# Patient Record
Sex: Male | Born: 1960
Health system: Southern US, Community
[De-identification: ages and names within clinical notes are randomized; demographics above are authoritative.]

## PROBLEM LIST (undated history)

## (undated) ENCOUNTER — Inpatient Hospital Stay: Admission: EM | Payer: Self-pay | Source: Home / Self Care

## (undated) DIAGNOSIS — I119 Hypertensive heart disease without heart failure: Secondary | ICD-10-CM

## (undated) DIAGNOSIS — E669 Obesity, unspecified: Secondary | ICD-10-CM

## (undated) DIAGNOSIS — I639 Cerebral infarction, unspecified: Secondary | ICD-10-CM

## (undated) DIAGNOSIS — I219 Acute myocardial infarction, unspecified: Secondary | ICD-10-CM

## (undated) DIAGNOSIS — C801 Malignant (primary) neoplasm, unspecified: Secondary | ICD-10-CM

## (undated) DIAGNOSIS — R9389 Abnormal findings on diagnostic imaging of other specified body structures: Secondary | ICD-10-CM

## (undated) DIAGNOSIS — I252 Old myocardial infarction: Secondary | ICD-10-CM

## (undated) DIAGNOSIS — I10 Essential (primary) hypertension: Secondary | ICD-10-CM

## (undated) DIAGNOSIS — R269 Unspecified abnormalities of gait and mobility: Secondary | ICD-10-CM

## (undated) DIAGNOSIS — G4730 Sleep apnea, unspecified: Secondary | ICD-10-CM

## (undated) DIAGNOSIS — E119 Type 2 diabetes mellitus without complications: Secondary | ICD-10-CM

## (undated) DIAGNOSIS — I4891 Unspecified atrial fibrillation: Secondary | ICD-10-CM

## (undated) DIAGNOSIS — Z87891 Personal history of nicotine dependence: Secondary | ICD-10-CM

## (undated) DIAGNOSIS — R9431 Abnormal electrocardiogram [ECG] [EKG]: Secondary | ICD-10-CM

## (undated) DIAGNOSIS — R413 Other amnesia: Secondary | ICD-10-CM

## (undated) DIAGNOSIS — R270 Ataxia, unspecified: Secondary | ICD-10-CM

## (undated) DIAGNOSIS — Z9289 Personal history of other medical treatment: Secondary | ICD-10-CM

## (undated) DIAGNOSIS — R7303 Prediabetes: Secondary | ICD-10-CM

## (undated) DIAGNOSIS — I619 Nontraumatic intracerebral hemorrhage, unspecified: Secondary | ICD-10-CM

## (undated) HISTORY — DX: Abnormal findings on diagnostic imaging of other specified body structures: R93.89

## (undated) HISTORY — DX: Old myocardial infarction: I25.2

## (undated) HISTORY — DX: Other amnesia: R41.3

## (undated) HISTORY — PX: CIRCUMCISION: SUR203

## (undated) HISTORY — DX: Obesity, unspecified: E66.9

## (undated) HISTORY — DX: Sleep apnea, unspecified: G47.30

## (undated) HISTORY — DX: Nontraumatic intracerebral hemorrhage, unspecified: I61.9

## (undated) HISTORY — PX: APPENDECTOMY: SHX54

## (undated) HISTORY — DX: Type 2 diabetes mellitus without complications: E11.9

## (undated) HISTORY — DX: Unspecified abnormalities of gait and mobility: R26.9

## (undated) HISTORY — DX: Hypertensive heart disease without heart failure: I11.9

## (undated) HISTORY — PX: CHOLECYSTECTOMY: SHX55

## (undated) HISTORY — DX: Ataxia, unspecified: R27.0

## (undated) HISTORY — DX: Personal history of nicotine dependence: Z87.891

## (undated) HISTORY — DX: Cerebral infarction, unspecified: I63.9

## (undated) HISTORY — PX: HEMORRHOID SURGERY: SHX153

## (undated) HISTORY — DX: Prediabetes: R73.03

## (undated) HISTORY — DX: Personal history of other medical treatment: Z92.89

## (undated) HISTORY — DX: Abnormal electrocardiogram (ECG) (EKG): R94.31

## (undated) HISTORY — DX: Unspecified atrial fibrillation: I48.91

## (undated) MED FILL — Fosaprepitant Dimeglumine For IV Infusion 150 MG (Base Eq): INTRAVENOUS | Qty: 5 | Status: AC

---

## 2014-04-30 DIAGNOSIS — I119 Hypertensive heart disease without heart failure: Secondary | ICD-10-CM

## 2014-04-30 DIAGNOSIS — R270 Ataxia, unspecified: Secondary | ICD-10-CM

## 2014-04-30 DIAGNOSIS — R269 Unspecified abnormalities of gait and mobility: Secondary | ICD-10-CM

## 2014-04-30 DIAGNOSIS — R9431 Abnormal electrocardiogram [ECG] [EKG]: Secondary | ICD-10-CM

## 2014-04-30 DIAGNOSIS — R413 Other amnesia: Secondary | ICD-10-CM

## 2014-04-30 DIAGNOSIS — I639 Cerebral infarction, unspecified: Secondary | ICD-10-CM

## 2014-04-30 DIAGNOSIS — Z9289 Personal history of other medical treatment: Secondary | ICD-10-CM

## 2014-04-30 DIAGNOSIS — R9389 Abnormal findings on diagnostic imaging of other specified body structures: Secondary | ICD-10-CM

## 2014-04-30 HISTORY — DX: Unspecified abnormalities of gait and mobility: R26.9

## 2014-04-30 HISTORY — DX: Ataxia, unspecified: R27.0

## 2014-04-30 HISTORY — DX: Other amnesia: R41.3

## 2014-04-30 HISTORY — DX: Abnormal findings on diagnostic imaging of other specified body structures: R93.89

## 2014-04-30 HISTORY — DX: Cerebral infarction, unspecified: I63.9

## 2014-04-30 HISTORY — DX: Hypertensive heart disease without heart failure: I11.9

## 2014-04-30 HISTORY — DX: Personal history of other medical treatment: Z92.89

## 2014-04-30 HISTORY — DX: Abnormal electrocardiogram (ECG) (EKG): R94.31

## 2014-05-05 DIAGNOSIS — I619 Nontraumatic intracerebral hemorrhage, unspecified: Secondary | ICD-10-CM

## 2014-05-05 HISTORY — DX: Nontraumatic intracerebral hemorrhage, unspecified: I61.9

## 2014-05-08 DIAGNOSIS — I214 Non-ST elevation (NSTEMI) myocardial infarction: Secondary | ICD-10-CM | POA: Insufficient documentation

## 2014-05-20 ENCOUNTER — Telehealth: Payer: Self-pay | Admitting: Medical

## 2014-05-20 ENCOUNTER — Encounter: Payer: Self-pay | Admitting: Medical

## 2014-05-20 ENCOUNTER — Ambulatory Visit (INDEPENDENT_AMBULATORY_CARE_PROVIDER_SITE_OTHER): Payer: BLUE CROSS/BLUE SHIELD | Admitting: Medical

## 2014-05-20 VITALS — BP 130/80 | HR 76 | Temp 97.9°F | Resp 15 | Ht >= 80 in | Wt 318.0 lb

## 2014-05-20 DIAGNOSIS — R413 Other amnesia: Secondary | ICD-10-CM | POA: Diagnosis not present

## 2014-05-20 DIAGNOSIS — I119 Hypertensive heart disease without heart failure: Secondary | ICD-10-CM | POA: Diagnosis not present

## 2014-05-20 DIAGNOSIS — R7989 Other specified abnormal findings of blood chemistry: Secondary | ICD-10-CM

## 2014-05-20 DIAGNOSIS — R270 Ataxia, unspecified: Secondary | ICD-10-CM | POA: Insufficient documentation

## 2014-05-20 DIAGNOSIS — I619 Nontraumatic intracerebral hemorrhage, unspecified: Secondary | ICD-10-CM | POA: Diagnosis not present

## 2014-05-20 DIAGNOSIS — I10 Essential (primary) hypertension: Secondary | ICD-10-CM | POA: Insufficient documentation

## 2014-05-20 DIAGNOSIS — Z87891 Personal history of nicotine dependence: Secondary | ICD-10-CM

## 2014-05-20 DIAGNOSIS — R52 Pain, unspecified: Secondary | ICD-10-CM | POA: Diagnosis not present

## 2014-05-20 DIAGNOSIS — R42 Dizziness and giddiness: Secondary | ICD-10-CM | POA: Insufficient documentation

## 2014-05-20 DIAGNOSIS — R778 Other specified abnormalities of plasma proteins: Secondary | ICD-10-CM

## 2014-05-20 LAB — CBC
HCT: 39.1 % (ref 39.0–52.0)
Hemoglobin: 13.3 g/dL (ref 13.0–17.0)
MCH: 29.4 pg (ref 26.0–34.0)
MCHC: 34 g/dL (ref 30.0–36.0)
MCV: 86.5 fL (ref 78.0–100.0)
MPV: 9.9 fL (ref 8.6–12.4)
PLATELETS: 340 10*3/uL (ref 150–400)
RBC: 4.52 MIL/uL (ref 4.22–5.81)
RDW: 14.1 % (ref 11.5–15.5)
WBC: 9.7 10*3/uL (ref 4.0–10.5)

## 2014-05-20 MED ORDER — ATORVASTATIN CALCIUM 20 MG PO TABS: 20.0000 mg | ORAL_TABLET | Freq: Every day | ORAL | Status: DC

## 2014-05-20 MED ORDER — LABETALOL HCL 200 MG PO TABS: 200.0000 mg | ORAL_TABLET | Freq: Two times a day (BID) | ORAL | Status: DC

## 2014-05-20 MED ORDER — HYDROCODONE-ACETAMINOPHEN 5-325 MG PO TABS: 1.0000 | ORAL_TABLET | Freq: Three times a day (TID) | ORAL | Status: DC | PRN

## 2014-05-20 MED ORDER — AMLODIPINE BESYLATE 10 MG PO TABS: 10.0000 mg | ORAL_TABLET | Freq: Every day | ORAL | Status: DC

## 2014-05-20 MED ORDER — NICOTINE 14 MG/24HR TD PT24: 14.0000 mg | MEDICATED_PATCH | Freq: Every day | TRANSDERMAL | Status: DC

## 2014-05-20 MED ORDER — HYDRALAZINE HCL 100 MG PO TABS: 100.0000 mg | ORAL_TABLET | Freq: Three times a day (TID) | ORAL | Status: DC

## 2014-05-20 MED ORDER — LOSARTAN POTASSIUM 100 MG PO TABS: 100.0000 mg | ORAL_TABLET | Freq: Every day | ORAL | Status: DC

## 2014-05-20 MED ORDER — SPIRONOLACTONE 50 MG PO TABS
50.0000 mg | ORAL_TABLET | Freq: Every day | ORAL | Status: DC
Start: 2014-05-20 — End: 2014-05-28

## 2014-05-20 NOTE — Telephone Encounter (Signed)
pls contact home health to begin physical therapy and occupational therapy for this patient.   He would like in home therapy for PT and OT as he currently can't drive.   diagnoses include recent stroke, ataxia, short term memory loss, gait changes.

## 2014-05-20 NOTE — Telephone Encounter (Signed)
Please refer to neurology here locally for stroke f/u.

## 2014-05-20 NOTE — Telephone Encounter (Signed)
Called out cane to his pharmacy per Vincenza HewsShane.

## 2014-05-20 NOTE — Progress Notes (Signed)
Subjective Here as a new patient today, accompanied by his wife.   Was living in [place], [place], but they just moved to [place] recently.  He still works for [employer] in [employer] which he has done long term.  He is here for hospital f/u.    Was at work on 05/05/14, felt disoriented, dizzy, headache, EMS was called and taken to [facility] ED.  Was then transferred to [facility] for stroke, MI, uncontrolled hypertension, memory loss.   Was started on several medications, and this is his first hospital f/u.    He has cardiology f/u appt already scheduled here in [facility] with Dr. Clarise Cruz.  He has neurology consult f/u scheduled for [facility] but would rather that be here in [place] since [place] is far away.   He is taking all his new medications.  Thought he was discharged with pain medication, but has none and reports pain.    He reported was in usual state of health prior to the stroke, working full time, and had no routine complaints until the stroke . He had avoided going to the doctors for years.    He currently reports being off balance with walking, thinks a cane may help for now. Worried about falling.  He still notes short term memory loss since the stroke.  Ongoing headache, dizziness, sometimes slower to process thoughts, and has generalized pains in back, knees, legs, arms, wonders if this is related to cholesterol medication or new medications as he did not have these issues prior.  He is a long time smoker.   He also now notes not getting erections since all this has occurred.   He hasn't started PT or OT, but has order for this, and frustrated this hasn't started.   No other aggravating or relieving factors. No other complaint.  Past Medical History  Diagnosis Date  . Intraparenchymal hemorrhage of brain 05/05/14    hospitalization [facility]  . Hypertensive heart disease 04/2014  . Stroke 04/2014  . EKG abnormalities 04/2014    poor R wave  progression, NSR  . H/O echocardiogram 04/2014    moderate LVH, 60-65% EF, no valve disease  . Abnormal ultrasound of carotid artery 04/2014    no significant obstruction  . Ataxia 04/2014  . Short-term memory loss 04/2014  . Gait disturbance 04/2014    s/p stroke  . Former smoker     25 years x 1.5 ppd, stopped 04/2014    Past Surgical History  Procedure Laterality Date  . Hemorrhoid surgery    . Appendectomy      History   Social History  . Marital Status: Married    Spouse Name: N/A  . Number of Children: N/A  . Years of Education: N/A   Occupational History  . Not on file.   Social History Main Topics  . Smoking status: Former Smoker -- 1.50 packs/day for 25 years    Quit date: 05/05/2014  . Smokeless tobacco: Not on file  . Alcohol Use: No  . Drug Use: No  . Sexual Activity: Not on file   Other Topics Concern  . Not on file   Social History Narrative   Married to [name], non denominational, works at [employer] in [place], [place].  From [place], but moved to [place] 2016.      Family History  Problem Relation Age of Onset  . Heart disease Mother   . Hypertension Mother   . Hypertension Brother   .  Aneurysm Paternal Grandmother      Current outpatient prescriptions:  .  amLODipine (NORVASC) 10 MG tablet, Take by mouth., Disp: , Rfl:  .  atorvastatin (LIPITOR) 20 MG tablet, Take by mouth., Disp: , Rfl:  .  hydrALAZINE (APRESOLINE) 100 MG tablet, Take by mouth., Disp: , Rfl:  .  labetalol (NORMODYNE) 200 MG tablet, Take by mouth., Disp: , Rfl:  .  losartan (COZAAR) 100 MG tablet, Take by mouth., Disp: , Rfl:  .  nicotine (NICODERM CQ - DOSED IN MG/24 HOURS) 14 mg/24hr patch, Place 14 mg onto the skin daily., Disp: , Rfl:  .  nicotine (NICODERM CQ - DOSED IN MG/24 HOURS) 14 mg/24hr patch, Place onto the skin., Disp: , Rfl:  .  spironolactone (ALDACTONE) 50 MG tablet, Take by mouth., Disp: , Rfl:  .  HYDROcodone-acetaminophen (NORCO/VICODIN) 5-325 MG  per tablet, Take 1 tablet by mouth every 8 (eight) hours as needed for moderate pain., Disp: 45 tablet, Rfl: 0  No Known Allergies     Objective: BP 130/80 mmHg  Pulse 76  Temp(Src) 97.9 F (36.6 C) (Oral)  Resp 15  Ht 6' 8.5" (2.045 m)  Wt 318 lb (144.244 kg)  BMI 34.49 kg/m2  General appearance: alert, no distress, WD/WN, tall AA male, obese HEENT: normocephalic, sclerae anicteric, PERRLA, EOMi, nares patent, no discharge or erythema, pharynx normal Oral cavity: MMM, no lesions Neck: supple, no lymphadenopathy, no thyromegaly, no masses, no bruits Heart: RRR, normal S1, S2, no murmurs Lungs: CTA bilaterally, no wheezes, rhonchi, or rales Back: non tender Musculoskeletal: nontender, no swelling, no obvious deformity Extremities: no edema, no cyanosis, no clubbing Pulses: 2+ symmetric, upper and lower extremities, normal cap refill Neurological: alert, oriented x 2, seems to have slightly guarded gait, mild ataxia, seems to have a little trouble following commands such as finger to nose or directions with examination questions.  Seems to have normal strength and sensation throughout, normal DTRs.    Psychiatric: normal affect, behavior normal, pleasant   From 05/05/14 hospital notes:  CT brain Imaging showed parenchymal hematoma into the posterior aspet of the left cingulate gyrus and left occipital lobe, in the precuneus, extension into the occipital horn and body of left lateral ventricle and slightly into right lateral ventricle.  Minimal amount of blood in the 3rd, no hydrocephalus.  Old lacunar infarct in anterior limb of the right internal capsule.  No bleeding on CT angiogram.  There is only a single vertebral artery.    Renal function, coags, platelets, fibrinogen level all normal.   No other lab abnormalities noted other than elevated troponin's.    EKG was noted to have NSR, poor anterior R wave progression, no ischemic changes Echo showed EF 60-65%, moderate LVH, no  valve issues, normal bubble study.   Carotid duplex shows no carotid obstructive disease.   Renal duplex shows no hemodynamically significant renal artery stenosis with patent renal veins   Assessment: Encounter Diagnoses  Name Primary?  . Intraparenchymal hemorrhage of brain Yes  . Essential hypertension   . Hypertensive heart disease without heart failure   . Elevated troponin   . Ataxia   . Amnesia memory loss   . Dizziness and giddiness   . Diffuse pain   . Former smoker     Plan: Reviewed the stack of paperwork they brought.  will request additional records and discharge summary from hospital.   Will move forward with orders for physical and occupational therapy, preferably for home visits since wife  works full time and he is not fit to drive at the moment.    Ataxia - Order for cane for now, referral for PT/OT and neurology referral  Stroke/intraparenchymal hemorrhage of brain - will need brain imaging in the next week or 2 as a f/u.  referral to neurology for consult as well.  Hypertensive heart disease, elevated troponin - c/t all medications, refilled all medications.   He has f/u already with cardiology.   He will need outpatient stress test recommended 4-6 weeks following additional recovery from stroke.   Amnesia - neurology referral and hopefully this will resolve with time.  Discussed ways to manage at home with clocks, newspaper daily reading, talking about current things with wife, friends, and working with wife to reinforced the here and now  Diffuse pain - likely related to the recent hypoperfusion issues.  Hydrocodone prn short term.  Former smoker - discussed the importance or remaining abstinent, c/t nicotine patch  F/u pending referrals, neuro consult, PT/OT referral, CT head, cardiology f/u, and here in 2wk  Spent >45 min both face to face and with referrals, review of records, discussion of his recent hospitalization and clarification of his diagnoses.

## 2014-05-20 NOTE — Telephone Encounter (Signed)
[facility] at [phone] per [place] RanchShane.

## 2014-05-20 NOTE — Telephone Encounter (Signed)
Faxed referral form and all information to diane at [facility] [phone]

## 2014-05-20 NOTE — Telephone Encounter (Signed)
pls call out a cane for ataxia, recent stroke, dizziness.

## 2014-05-20 NOTE — Telephone Encounter (Signed)
Please set up CT head/brain without contrast for late next week.   Please cancel the May 13th CT in [place] as well as the neurology f/[place] appt in [place] once we have the local neurology appt.

## 2014-05-21 ENCOUNTER — Telehealth: Payer: Self-pay | Admitting: Medical

## 2014-05-21 LAB — BASIC METABOLIC PANEL
BUN: 17 mg/dL (ref 6–23)
CHLORIDE: 105 meq/L (ref 96–112)
CO2: 21 meq/L (ref 19–32)
CREATININE: 1.07 mg/dL (ref 0.50–1.35)
Calcium: 9.3 mg/dL (ref 8.4–10.5)
Glucose, Bld: 120 mg/dL — ABNORMAL HIGH (ref 70–99)
POTASSIUM: 4.8 meq/L (ref 3.5–5.3)
Sodium: 138 mEq/L (ref 135–145)

## 2014-05-21 NOTE — Telephone Encounter (Signed)
Misty [email or web address] state that they received the referral from us but per insurance they are out of network and Misty StanleyLisa will let you know who they can refer pt to

## 2014-05-25 ENCOUNTER — Other Ambulatory Visit: Payer: Self-pay | Admitting: Family Medicine

## 2014-05-25 DIAGNOSIS — I618 Other nontraumatic intracerebral hemorrhage: Secondary | ICD-10-CM

## 2014-05-25 NOTE — Telephone Encounter (Signed)
Patient's wife is [facility] of the [facility]

## 2014-05-25 NOTE — Telephone Encounter (Signed)
I re-fax the patient's information and referral over to [facility] for OT and PT fax # [phone]

## 2014-05-25 NOTE — Telephone Encounter (Signed)
[facility] 05/28/14 @ 1130 AM CT [facility]  [address] [facility], [place]

## 2014-05-26 ENCOUNTER — Other Ambulatory Visit: Payer: Self-pay

## 2014-05-26 NOTE — Telephone Encounter (Signed)
Patient is being seen by [facility] for OT and PT. The therapists said that he is very motivated  and willing as well as the wife. She called for a verbal to continue therapy with the patient. She would like to do this 3 times a week for 4 weeks.  therapists # [phone]

## 2014-05-26 NOTE — Telephone Encounter (Signed)
That is fine 

## 2014-05-28 ENCOUNTER — Ambulatory Visit (INDEPENDENT_AMBULATORY_CARE_PROVIDER_SITE_OTHER): Payer: BLUE CROSS/BLUE SHIELD | Admitting: Cardiovascular Disease

## 2014-05-28 ENCOUNTER — Inpatient Hospital Stay: Admission: RE | Admit: 2014-05-28 | Payer: Self-pay | Source: Ambulatory Visit

## 2014-05-28 ENCOUNTER — Encounter: Payer: Self-pay | Admitting: Cardiovascular Disease

## 2014-05-28 VITALS — BP 136/94 | HR 73 | Ht >= 80 in | Wt 313.0 lb

## 2014-05-28 DIAGNOSIS — I119 Hypertensive heart disease without heart failure: Secondary | ICD-10-CM

## 2014-05-28 DIAGNOSIS — R42 Dizziness and giddiness: Secondary | ICD-10-CM | POA: Diagnosis not present

## 2014-05-28 DIAGNOSIS — I214 Non-ST elevation (NSTEMI) myocardial infarction: Secondary | ICD-10-CM

## 2014-05-28 DIAGNOSIS — I10 Essential (primary) hypertension: Secondary | ICD-10-CM

## 2014-05-28 MED ORDER — SPIRONOLACTONE 50 MG PO TABS: 50.0000 mg | ORAL_TABLET | Freq: Every day | ORAL | Status: DC

## 2014-05-28 MED ORDER — HYDRALAZINE HCL 100 MG PO TABS: 100.0000 mg | ORAL_TABLET | Freq: Three times a day (TID) | ORAL | Status: DC

## 2014-05-28 MED ORDER — LABETALOL HCL 200 MG PO TABS: 200.0000 mg | ORAL_TABLET | Freq: Two times a day (BID) | ORAL | Status: DC

## 2014-05-28 MED ORDER — ATORVASTATIN CALCIUM 20 MG PO TABS: 20.0000 mg | ORAL_TABLET | Freq: Every day | ORAL | Status: DC

## 2014-05-28 MED ORDER — LOSARTAN POTASSIUM 100 MG PO TABS: 100.0000 mg | ORAL_TABLET | Freq: Every day | ORAL | Status: DC

## 2014-05-28 MED ORDER — AMLODIPINE BESYLATE 10 MG PO TABS: 10.0000 mg | ORAL_TABLET | Freq: Every day | ORAL | Status: DC

## 2014-05-28 NOTE — Patient Instructions (Signed)
Medication Instructions:  Your physician recommends that you continue on your current medications as directed. Please refer to the Current Medication list given to you today.   Labwork: None  Testing/Procedures: Your physician has requested that you have a lexiscan myoview. For further information please visit [email or web address]. Please follow instruction sheet, as given.   Follow-Up: Your physician wants you to follow-up in: 6 months with Dr. Elease HashimotoNahser.  You will receive a reminder letter in the mail two months in advance. If you don't receive a letter, please call our office to schedule the follow-up appointment.

## 2014-05-28 NOTE — Progress Notes (Signed)
Cardiology Office Note   Date:  05/28/2014   ID:  Brad EndoArchie [name], DOB 1960/05/15, MRN [id number]  PCP:  Ernst BreachYSINGER, Brad SHANE, PA-C  Cardiologist:   Vesta MixerNahser,  J, MD   Chief Complaint  Patient presents with  . Hypertension   Problem list: 1. 1. Malignant hypertension - tachycardia gram shows normal left trigger systolic function with EF of 60-65% with moderate LVH 2. Intracranial hemorrhage caused by malignant hypertension 3. Non-ST segment elevation myocardial infarction-in the setting of malignant hypertension,  Echo shows normal LF function with EF of 60-65% with no segmental wall abn.    History of Present Illness: Brad [name] is a 54 y.o. male who presents for follow up for malignant hypertension and a type 2 non-ST segment myocardial infarction.  He's been doing fairly well from a cardiac point. He's not had any episodes of chest discomfort. He's currently in rehabilitation for intracranial hemorrhage. His blood pressure is much better controlled.  He has lost quite a bit of weight.      Past Medical History  Diagnosis Date  . Intraparenchymal hemorrhage of brain 05/05/14    [facility]  . Hypertensive heart disease 04/2014  . Stroke 04/2014  . EKG abnormalities 04/2014    poor R wave progression, NSR  . H/O echocardiogram 04/2014    moderate LVH, 60-65% EF, no valve disease  . Abnormal ultrasound of carotid artery 04/2014    no significant obstruction  . Ataxia 04/2014  . Short-term memory loss 04/2014  . Gait disturbance 04/2014    s/p stroke  . Former smoker     25 years x 1.5 ppd, stopped 04/2014    Past Surgical History  Procedure Laterality Date  . Hemorrhoid surgery    . Appendectomy       Current Outpatient Prescriptions  Medication Sig Dispense Refill  . amLODipine (NORVASC) 10 MG tablet Take 1 tablet (10 mg total) by mouth daily. 90 tablet 1  . atorvastatin (LIPITOR) 20 MG tablet Take 1 tablet (20 mg total) by mouth daily  at 6 PM. 90 tablet 1  . hydrALAZINE (APRESOLINE) 100 MG tablet Take 1 tablet (100 mg total) by mouth 3 (three) times daily. 270 tablet 0  . labetalol (NORMODYNE) 200 MG tablet Take 1 tablet (200 mg total) by mouth 2 (two) times daily. 180 tablet 1  . losartan (COZAAR) 100 MG tablet Take 1 tablet (100 mg total) by mouth daily. 90 tablet 1  . nicotine (NICODERM CQ - DOSED IN MG/24 HOURS) 14 mg/24hr patch Place 14 mg onto the skin daily.    . nicotine (NICODERM CQ - DOSED IN MG/24 HOURS) 14 mg/24hr patch Place 1 patch (14 mg total) onto the skin daily. 28 patch 1  . spironolactone (ALDACTONE) 50 MG tablet Take 1 tablet (50 mg total) by mouth daily. 90 tablet 1   No current facility-administered medications for this visit.    Allergies:   Review of patient's allergies indicates no known allergies.    Social History:  The patient  reports that he quit smoking about 3 weeks ago. He does not have any smokeless tobacco history on file. He reports that he does not drink alcohol or use illicit drugs.   Family History:  The patient's family history includes Aneurysm in his paternal grandmother; Heart disease in his mother; Hypertension in his brother and mother.    ROS:  Please see the history of present illness.    Review of Systems: Constitutional:  denies  fever, chills, diaphoresis,   and fatigue. Appetite has not been normal   HEENT: denies photophobia, eye pain, redness, hearing loss, ear pain, congestion, sore throat, rhinorrhea, sneezing, neck pain, neck stiffness and tinnitus.  Respiratory: denies SOB, DOE, cough, chest tightness, and wheezing.  Cardiovascular: denies chest pain, palpitations and leg swelling.  Gastrointestinal: denies nausea, vomiting, abdominal pain, diarrhea, constipation, blood in stool.  Genitourinary: denies dysuria, urgency, frequency, hematuria, flank pain and difficulty urinating.  Musculoskeletal: denies  myalgias, back pain, joint swelling, arthralgias and gait  problem.   Skin: denies pallor, rash and wound.  Neurological: denies dizziness, seizures, syncope, weakness, light-headedness, numbness and headaches.   Hematological: denies adenopathy, easy bruising, personal or family bleeding history.  Psychiatric/ Behavioral: denies suicidal ideation, mood changes, confusion, nervousness, sleep disturbance and agitation.       All other systems are reviewed and negative.    PHYSICAL EXAM: VS:  BP 136/94 mmHg  Pulse 73  Ht 6' 8.5" (2.045 m)  Wt 313 lb (141.976 kg)  BMI 33.95 kg/m2 , BMI Body mass index is 33.95 kg/(m^2). GEN: Well nourished, well developed, in no acute distress HEENT: normal Neck: no JVD, carotid bruits, or masses Cardiac: RRR; no murmurs, rubs, or gallops,no edema  Respiratory:  clear to auscultation bilaterally, normal work of breathing GI: soft, nontender, nondistended, + BS MS: no deformity or atrophy Skin: warm and dry, no rash Neuro:  Strength and sensation are intact Psych: normal   EKG:  EKG is not ordered today. The ekg ordered today demonstrates    Recent Labs: 05/20/2014: BUN 17; Creatinine 1.07; Hemoglobin 13.3; Platelets 340; Potassium 4.8; Sodium 138    Lipid Panel No results found for: CHOL, TRIG, HDL, CHOLHDL, VLDL, LDLCALC, LDLDIRECT    Wt Readings from Last 3 Encounters:  05/28/14 313 lb (141.976 kg)  05/20/14 318 lb (144.244 kg)      Other studies Reviewed: Additional studies/ records that were reviewed today include: . Review of the above records demonstrates:    ASSESSMENT AND PLAN:  1.  Malignant hypertension - tachycardia gram shows normal left trigger systolic function with EF of 60-65% with moderate LVH 2. Intracranial hemorrhage caused by malignant hypertension 3. Non-ST segment elevation myocardial infarction-in the setting of malignant hypertension,  Echo shows normal LF function with EF of 60-65% with no segmental wall abn.    Current medicines are reviewed at length with  the patient today.  The patient does not have concerns regarding medicines.  The following changes have been made:  no change  Labs/ tests ordered today include:  No orders of the defined types were placed in this encounter.     Disposition:   FU with me in 6 months. Steffanie Dunn in 1 month      , Deloris Ping, MD  05/28/2014 3:53 PM    [facility] [address] [place], [place], [place]  [address] Phone: [phone]; Fax: [phone]   [facility]  [address] [place], [place]  [address] [phone]    Fax [phone]

## 2014-05-31 ENCOUNTER — Encounter: Payer: Self-pay | Admitting: Medical

## 2014-06-07 ENCOUNTER — Ambulatory Visit
Admission: RE | Admit: 2014-06-07 | Discharge: 2014-06-07 | Disposition: A | Discharge status: Home / Self Care | Payer: BLUE CROSS/BLUE SHIELD | Source: Ambulatory Visit | Attending: Medical | Admitting: Medical

## 2014-06-07 DIAGNOSIS — I618 Other nontraumatic intracerebral hemorrhage: Secondary | ICD-10-CM

## 2014-06-08 ENCOUNTER — Ambulatory Visit
Admission: RE | Admit: 2014-06-08 | Discharge: 2014-06-08 | Disposition: A | Discharge status: Home / Self Care | Payer: BLUE CROSS/BLUE SHIELD | Source: Ambulatory Visit | Attending: Medical | Admitting: Medical

## 2014-06-08 ENCOUNTER — Other Ambulatory Visit: Payer: Self-pay | Admitting: Family Medicine

## 2014-06-08 DIAGNOSIS — S06369S Traumatic hemorrhage of cerebrum, unspecified, with loss of consciousness of unspecified duration, sequela: Secondary | ICD-10-CM

## 2014-06-10 ENCOUNTER — Encounter: Payer: Self-pay | Admitting: Medical

## 2014-06-10 ENCOUNTER — Ambulatory Visit (INDEPENDENT_AMBULATORY_CARE_PROVIDER_SITE_OTHER): Payer: BLUE CROSS/BLUE SHIELD | Admitting: Medical

## 2014-06-10 VITALS — BP 158/80 | HR 78 | Temp 98.3°F | Resp 16 | Wt 320.0 lb

## 2014-06-10 DIAGNOSIS — R270 Ataxia, unspecified: Secondary | ICD-10-CM

## 2014-06-10 DIAGNOSIS — I119 Hypertensive heart disease without heart failure: Secondary | ICD-10-CM

## 2014-06-10 DIAGNOSIS — I10 Essential (primary) hypertension: Secondary | ICD-10-CM | POA: Diagnosis not present

## 2014-06-10 DIAGNOSIS — R471 Dysarthria and anarthria: Secondary | ICD-10-CM | POA: Diagnosis not present

## 2014-06-10 DIAGNOSIS — R42 Dizziness and giddiness: Secondary | ICD-10-CM

## 2014-06-10 DIAGNOSIS — I69923 Fluency disorder following unspecified cerebrovascular disease: Secondary | ICD-10-CM | POA: Diagnosis not present

## 2014-06-10 DIAGNOSIS — I619 Nontraumatic intracerebral hemorrhage, unspecified: Secondary | ICD-10-CM | POA: Diagnosis not present

## 2014-06-10 DIAGNOSIS — R413 Other amnesia: Secondary | ICD-10-CM | POA: Diagnosis not present

## 2014-06-10 NOTE — Progress Notes (Signed)
Subjective Here for f/u, accompanied by his wife.  I just saw him recently as a new patient for hospital f/u from recent hemorrhagic stroke.  He notes from last visit some decreased sensation in right arm that has been intermittent since the stroke, feels a little weaker on right side than left, still has dizziness and feeling of disequilibrium, and in the last week having some stuttering.  Since last visit he has been getting occupation and physical therapy, and this past week started speech therapy.  Has seen cardiology recently, has nuclear stress test planned in about a month.   Has f/u with neurology on Monday.   Needs short term disability form completed.     From last visit's notes:  [place] living in [place], [place], but they just moved to [place] recently.  He still works for [employer] in [place] making [place] which he has done long term.  He is here for hospital f/u.    Was at work on 05/05/14, felt disoriented, dizzy, headache, EMS was called and [facility].  Was then transferred to [facility] for stroke, MI, uncontrolled hypertension, memory loss.   Was started on several medications, and this is his first hospital f/u.    He has cardiology f/u appt already scheduled [place] with Dr. Clarise CruzBenshimon.  He has neurology consult f/u scheduled for [facility] but would rather that be here in [place] since [place] far away.   He is taking all his new medications.  Thought he was discharged with pain medication, but has none and reports pain.    He reported was in usual state of health prior to the stroke, working full time, and had no routine complaints until the stroke . He had avoided going to the doctors for years.  No other aggravating or relieving factors. No other complaint.  Past Medical History  Diagnosis Date  . Intraparenchymal hemorrhage of brain 05/05/14    hospitalization [facility]  . Hypertensive heart disease 04/2014  . Stroke 04/2014  .  EKG abnormalities 04/2014    poor R wave progression, NSR  . H/O echocardiogram 04/2014    moderate LVH, 60-65% EF, no valve disease  . Abnormal ultrasound of carotid artery 04/2014    no significant obstruction  . Ataxia 04/2014  . Short-term memory loss 04/2014  . Gait disturbance 04/2014    s/p stroke  . Former smoker     25 years x 1.5 ppd, stopped 04/2014    Past Surgical History  Procedure Laterality Date  . Hemorrhoid surgery    . Appendectomy      History   Social History  . Marital Status: Married    Spouse Name: N/A  . Number of Children: N/A  . Years of Education: N/A   Occupational History  . Not on file.   Social History Main Topics  . Smoking status: Former Smoker -- 1.50 packs/day for 25 years    Quit date: 05/05/2014  . Smokeless tobacco: Not on file  . Alcohol Use: No  . Drug Use: No  . Sexual Activity: Not on file   Other Topics Concern  . Not on file   Social History Narrative   Married to TransMontaigneCrystal Cobert, non denominational, works at [employer] in [place], [place].  From [place], but moved to [place] 2016.      Family History  Problem Relation Age of Onset  . Heart disease Mother   . Hypertension Mother   . Hypertension  Brother   . Aneurysm Paternal Grandmother      Current outpatient prescriptions:  .  amLODipine (NORVASC) 10 MG tablet, Take 1 tablet (10 mg total) by mouth daily., Disp: 90 tablet, Rfl: 3 .  atorvastatin (LIPITOR) 20 MG tablet, Take 1 tablet (20 mg total) by mouth daily at 6 PM., Disp: 90 tablet, Rfl: 3 .  hydrALAZINE (APRESOLINE) 100 MG tablet, Take 1 tablet (100 mg total) by mouth 3 (three) times daily., Disp: 270 tablet, Rfl: 3 .  labetalol (NORMODYNE) 200 MG tablet, Take 1 tablet (200 mg total) by mouth 2 (two) times daily., Disp: 180 tablet, Rfl: 3 .  losartan (COZAAR) 100 MG tablet, Take 1 tablet (100 mg total) by mouth daily., Disp: 90 tablet, Rfl: 3 .  spironolactone (ALDACTONE) 50 MG tablet, Take 1 tablet (50 mg total)  by mouth daily., Disp: 90 tablet, Rfl: 3 .  nicotine (NICODERM CQ - DOSED IN MG/24 HOURS) 14 mg/24hr patch, Place 14 mg onto the skin daily., Disp: , Rfl:  .  nicotine (NICODERM CQ - DOSED IN MG/24 HOURS) 14 mg/24hr patch, Place 1 patch (14 mg total) onto the skin daily. (Patient not taking: Reported on 06/10/2014), Disp: 28 patch, Rfl: 1  No Known Allergies     Objective: BP 158/80 mmHg  Pulse 78  Temp(Src) 98.3 F (36.8 C) (Oral)  Resp 16  Wt 320 lb (145.151 kg)  General appearance: alert, no distress, WD/WN, tall AA male, obese HEENT: normocephalic, sclerae anicteric, PERRLA, EOMi, nares patent, no discharge or erythema, pharynx normal Oral cavity: MMM, no lesions Neck: supple, no lymphadenopathy, no thyromegaly, no masses, no bruits, no bruits Heart: RRR, normal S1, S2, no murmurs Lungs: CTA bilaterally, no wheezes, rhonchi, or rales Back: non tender Musculoskeletal: nontender, no swelling, no obvious deformity Extremities: no edema, no cyanosis, no clubbing Pulses: 2+ symmetric, upper and lower extremities, normal cap refill Neurological: alert, oriented x 2, seems to have guarded gait, mild ataxia, seems to have a little trouble following commands such as finger to nose or directions with examination questions.  Seems to have normal strength and sensation throughout, normal DTRs.    Psychiatric: normal affect, behavior normal, pleasant   From 05/05/14 hospital notes:  CT brain Imaging showed parenchymal hematoma into the posterior aspet of the left cingulate gyrus and left occipital lobe, in the precuneus, extension into the occipital horn and body of left lateral ventricle and slightly into right lateral ventricle.  Minimal amount of blood in the 3rd, no hydrocephalus.  Old lacunar infarct in anterior limb of the right internal capsule.  No bleeding on CT angiogram.  There is only a single vertebral artery.    Renal function, coags, platelets, fibrinogen level all normal.   No  other lab abnormalities noted other than elevated troponin's.    EKG was noted to have NSR, poor anterior R wave progression, no ischemic changes Echo showed EF 60-65%, moderate LVH, no valve issues, normal bubble study.   Carotid duplex shows no carotid obstructive disease.   Renal duplex shows no hemodynamically significant renal artery stenosis with patent renal veins   Assessment: Encounter Diagnoses  Name Primary?  . Intraparenchymal hemorrhage of brain Yes  . Essential hypertension   . Hypertensive heart disease without heart failure   . Ataxia   . Amnesia memory loss   . Dizziness and giddiness   . Dysarthria   . 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  C/t PT, OT, speech therapy.  Script for cane today.  C/t all medications.  Reviewed cardiology notes since last visit here.  Plan to have nuclear stress test as scheduled.    Will complete his short term disability forms.   He will f/u with neurology on Monday.  He continues to ask about Viagra, but advised that medication too risky to consider at this time . Cautioned on intercourse for the time being as well.  F/u here pending neuro consult

## 2014-06-11 ENCOUNTER — Telehealth: Payer: Self-pay | Admitting: Medical

## 2014-06-11 NOTE — Telephone Encounter (Signed)
Pt's wife, Crystal, called to see if forms that they brought in yesterday were completed and ready for pick up. Wife states that she explained to CNA yesterday that they will need completed form todayso that it cam=n be mailed to meet deadline and she was told that she would be able to pick it up this morning.Call wife at [phone] when form ready for pick up

## 2014-06-14 ENCOUNTER — Encounter: Payer: Self-pay | Admitting: Neurology

## 2014-06-14 ENCOUNTER — Ambulatory Visit (INDEPENDENT_AMBULATORY_CARE_PROVIDER_SITE_OTHER): Payer: BLUE CROSS/BLUE SHIELD | Admitting: Neurology

## 2014-06-14 VITALS — BP 127/79 | HR 69 | Temp 98.0°F | Ht >= 80 in | Wt 317.0 lb

## 2014-06-14 DIAGNOSIS — R51 Headache: Secondary | ICD-10-CM

## 2014-06-14 DIAGNOSIS — R9082 White matter disease, unspecified: Secondary | ICD-10-CM

## 2014-06-14 DIAGNOSIS — R519 Headache, unspecified: Secondary | ICD-10-CM

## 2014-06-14 DIAGNOSIS — R930 Abnormal findings on diagnostic imaging of skull and head, not elsewhere classified: Secondary | ICD-10-CM

## 2014-06-14 DIAGNOSIS — R413 Other amnesia: Secondary | ICD-10-CM | POA: Diagnosis not present

## 2014-06-14 DIAGNOSIS — F8081 Childhood onset fluency disorder: Secondary | ICD-10-CM

## 2014-06-14 DIAGNOSIS — I639 Cerebral infarction, unspecified: Secondary | ICD-10-CM | POA: Diagnosis not present

## 2014-06-14 DIAGNOSIS — R0683 Snoring: Secondary | ICD-10-CM | POA: Diagnosis not present

## 2014-06-14 DIAGNOSIS — I6381 Other cerebral infarction due to occlusion or stenosis of small artery: Secondary | ICD-10-CM

## 2014-06-14 DIAGNOSIS — I619 Nontraumatic intracerebral hemorrhage, unspecified: Secondary | ICD-10-CM | POA: Diagnosis not present

## 2014-06-14 DIAGNOSIS — G4719 Other hypersomnia: Secondary | ICD-10-CM | POA: Diagnosis not present

## 2014-06-14 DIAGNOSIS — I10 Essential (primary) hypertension: Secondary | ICD-10-CM

## 2014-06-14 NOTE — Progress Notes (Addendum)
[facility]    Provider:  Dr Lucia GaskinsAhern Referring Provider: Aleen Campiysinger, Kermit Baloavid S, PA-C Primary Care Physician:  Ernst BreachYSINGER, DAVID SHANE, PA-C  CC:  Stroke  HPI:  Brad [name] is a 54 y.o. male here as a referral from Dr. Aleen Campiysinger for stroke. He is here with his wife who provides much of the information. He was diagnosed with hemorrhagic stroke possibly due to uncontrolled HTN but also found to have extensive Thissen matter changes as well as multiple bilteral chronic lacunar infartcts.   Patient has had high blood pressure, untreated. The stroke happened on April 6th, was disoriented with headache. He had a bad headache and was brought to the ED and found to have hypertensive hemorrhage.  He has had uncontrolled HTN for a long time. No resultant weakness. Has stuttering of recent onset. He says he started stuttering 2 weeks ago but if he talks slow and concentrates he can control the stuttering. He is having a lot of memory loss. He is seeing Dr. Eden EmmsNishan in cardiology and he has a stress test scheduled. Since he had the stroke, he is haivng a difficult time writing words on paper. The letters and the words are difficult to understand. But he can text fine on his cell phone. He is having right sided numbness. He is left handed. He is in physical therapy, the sensation in the right hand is not the same since the stroke.   He takes naps during the day. He snores heavily. Wife hears arousals during the snoring. He is excessively tired during the day. He describes memory loss.   Reviewed notes, labs and imaging from outside physicians, which showed:   CT of the brain: personally reviewed images and agree with findings:  06/07/2014: Small hyperdense focus in the left thalamus concerning for a small focus of hemorrhage. Prior report from a CT of the brain performed 05/07/2014 mentions no hyperdense abnormality in the left thalamus. Recommend direct comparison with prior outside CT of  the brain dated 05/07/2014. Recommend follow-up CT brain in 24-48 hours to evaluate evolution, or sooner if the patient is clinical condition warrants.  06/08/2014: No evidence of parenchymal hemorrhage or extra-axial fluid collection. No mass lesion, mass effect, or midline shift. No CT evidence of acute infarction. Old left thalamic lacunar infarct (series 3/ image 14). Possible chronic right pontine lacunar infarct (series 3/image 7) versus artifact. Old right caudate/basal ganglia lacunar infarct (series 3/image 11). Subcortical Werts matter and periventricular small vessel ischemic Changes. Cerebral volume is within normal limits. No ventriculomegaly. The visualized paranasal sinuses are essentially clear. The mastoid air cells are unopacified. No evidence of calvarial fracture.  IMPRESSION: No evidence of acute intracranial abnormality.  BMP/CBC unremarkable   Review of Systems: Patient complains of symptoms per HPI as well as the following symptoms: fatigue, swelling, snoring, memory loss, confusion, headahce, numbness, insomnia, sleepiness, snoring, dizziness, joint pain, aching muscles, runny nose, not enough sleep, decreased energy. Pertinent negatives per HPI. All others negative.   History   Social History  . Marital Status: Married    Spouse Name: TransMontaigneCrystal Sesler  . Number of Children: 2  . Years of Education: 12   Occupational History  . Not on file.   Social History Main Topics  . Smoking status: Former Smoker -- 1.50 packs/day for 25 years    Quit date: 05/05/2014  . Smokeless tobacco: Not on file  . Alcohol Use: No  . Drug Use: No  . Sexual Activity: Not on file  Other Topics Concern  . Not on file   Social History Narrative   Lives at home with wife   Married to TransMontaigneCrystal Switzer, non denominational, works at [employer] in [place], [place].  From [place], but moved to [place] 2016.     Caffeine use: Drinks tea occass    Family History   Problem Relation Age of Onset  . Heart disease Mother   . Hypertension Mother   . Hypertension Brother   . Aneurysm Paternal Grandmother     Past Medical History  Diagnosis Date  . Intraparenchymal hemorrhage of brain 05/05/14    hospitalization [facility]  . Hypertensive heart disease 04/2014  . Stroke 04/2014  . EKG abnormalities 04/2014    poor R wave progression, NSR  . H/O echocardiogram 04/2014    moderate LVH, 60-65% EF, no valve disease  . Abnormal ultrasound of carotid artery 04/2014    no significant obstruction  . Ataxia 04/2014  . Short-term memory loss 04/2014  . Gait disturbance 04/2014    s/p stroke  . Former smoker     25 years x 1.5 ppd, stopped 04/2014    Past Surgical History  Procedure Laterality Date  . Hemorrhoid surgery    . Appendectomy      Current Outpatient Prescriptions  Medication Sig Dispense Refill  . amLODipine (NORVASC) 10 MG tablet Take 1 tablet (10 mg total) by mouth daily. 90 tablet 3  . atorvastatin (LIPITOR) 20 MG tablet Take 1 tablet (20 mg total) by mouth daily at 6 PM. 90 tablet 3  . hydrALAZINE (APRESOLINE) 100 MG tablet Take 1 tablet (100 mg total) by mouth 3 (three) times daily. 270 tablet 3  . labetalol (NORMODYNE) 200 MG tablet Take 1 tablet (200 mg total) by mouth 2 (two) times daily. 180 tablet 3  . losartan (COZAAR) 100 MG tablet Take 1 tablet (100 mg total) by mouth daily. 90 tablet 3  . spironolactone (ALDACTONE) 50 MG tablet Take 1 tablet (50 mg total) by mouth daily. 90 tablet 3   No current facility-administered medications for this visit.    Allergies as of 06/14/2014  . (No Known Allergies)    Vitals: BP 127/79 mmHg  Pulse 69  Temp(Src) 98 F (36.7 C)  Ht 6' 8.5" (2.045 m)  Wt 317 lb (143.79 kg)  BMI 34.38 kg/m2 Last Weight:  Wt Readings from Last 1 Encounters:  06/14/14 317 lb (143.79 kg)   Last Height:   Ht Readings from Last 1 Encounters:  06/14/14 6' 8.5" (2.045 m)   Physical  exam: Exam: Gen: NAD, conversant, well nourised, obese, well groomed                     CV: RRR, no MRG. No Carotid Bruits. No peripheral edema, warm, nontender Eyes: Conjunctivae clear without exudates or hemorrhage  Neuro: Detailed Neurologic Exam  Speech:    Patient has episodes of stuttering which he can control, and when he concentrates and talks slowly his speech is normal; fluent and spontaneous with normal comprehension.  Cognition:    The patient is oriented to person, place, and time;     recent and remote memory intact;     language without aphasia or dysarthria;     normal attention, concentration,     fund of knowledge Cranial Nerves:    The pupils are equal, round, and reactive to light. The fundi are flat. Visual fields are full to finger confrontation. Extraocular movements are  intact. Trigeminal sensation is intact and the muscles of mastication are normal. The face is symmetric. The palate elevates in the midline. Hearing intact. Voice is normal. Shoulder shrug is normal. The tongue has normal motion without fasciculations.   Coordination:    Normal finger to nose and heel to shin. Normal rapid alternating movements.   Gait:    Heel-toe and tandem gait are normal.   Motor Observation:    No asymmetry, no atrophy, and no involuntary movements noted. Tone:    Normal muscle tone.    Posture:    Posture is normal. normal erect    Strength:    Strength is V/V in the upper and lower limbs.      Sensation: intact to LT     Reflex Exam:  DTR's:    Deep tendon reflexes in the upper and lower extremities are normal bilaterally.   Toes:    The toes are downgoing bilaterally.   Clonus:    Clonus is absent.    Assessment/Plan:  54 year old male with uncontrolled HTN with a recent HTN hemorrhage with intraventricular extension. CT scan shows: Old left thalamic lacunar infarct. Possible chronic right pontine lacunar infarct. Old right caudate/basal ganglia  lacunar infarct. Subcortical Todaro matter and periventricular small vessel ischemic advanced for his age.  Patient needs close follow up with primary care for management of vascular risk factors especially blood pressure.  Need records from hospitalization for stroke, including imaging. will order  Patient needs a sleep study for evaluation of OSA due to his obesity, snoring, strokes, uncontrolled HTN, headaches, excessive daytime fatigue and memory changes  Will repeat MRI of the brain and MRA of the head  Referral to speech therapy for stuttering  Notes received from [facility]: Patient presented with hypertensive emergency and non-ST elevated myocardial infarction April 6th of 2016. CBC was unremarkable, BMP with slightly elevated creatinine at 1.1, past medical history of hypertension who presented due to sudden onset of headache and dizziness. At [facility] emergency room noted to have a left sided intracranial hemorrhage with intraventricular extension. He was hypertensive with systolic blood pressures in the 200s on admission. He was admitted to the ICU and started on Cardene drip. Then on nitroprusside drip. He was seen by neurosurgery and neurology. He was also treated conservatively for type II myocardial infarction with peak troponin 0.71. He did not have any neurologic deficits and had normal gait. His blood pressure was controlled. On discharge recommendations included antihypertensive regimen with monitoring at home. Avoiding aspirin and anticoagulants. Of note, drug screen was positive for barbiturates, methadone and oxycodone. CT of the head at [facility] showed no dramatic interval change in the size of the acute intraparenchymal hematoma in the left posterior medial parietal and occipital lobes with relatively unchanged regional mass effect. Intraventricular extension of hemorrhage is also relatively stable. CT angiogram showed acute parenchymal hematoma in  the posterior left cingulate gyrus and deep aspect of left occipital lobe extending into the left lateral ventricle and slightly into the right lateral ventricle without hydrocephalus and without midline shift. No aneurysm of the anterior posterior circulation as demonstrated. The right vertebral artery is not visualized. The left vertebral artery provides subtle inflow to the basilar artery. Large posterior communicating arteries are relatively small P1 segments of the posterior cerebral arteries bilaterally. CT of the head brain completed on 05/07/2014 showed an unchanged 1.7 x 2.9 cm intraparenchymal hemorrhage in the left splenium of the corpus callosum with intraventricular extension  into the third and both lateral ventricles greatest involving left lateral ventricle. No hydrocephalus or change in the ventricles compared to previous study. Moderate periventricular decreased attenuation could represent old microvascular ischemic changes or Wandell matter disease. Old ischemia involving the thalami bilaterally in the anterior basal ganglia region. Old ischemia involving the pons bilaterally greatest on the right. MRI of the brain with and without contrast showed no dramatic interval change in size of acute intraparenchymal hematoma in the left posterior medial parietal and occipital lobes. There is relatively unchanged regional mass effect. Intraventricular extension of hemorrhage is also relatively stable. Tiny foci of diffusion restriction with ADC signal loss in the deep right frontal Przybysz matter and left caudate each measuring 2-3 mm inside. These are just too small to characterize but raise concern for tiny acute infarcts. Extensive T2 hyperintense Stephanie matter signal changes as described. These likely represent manifestations of chronic microvascular disease. However the extensive changes is relatively out of proportion to patient's age. These can be accelerated in the context of chronic hypertension.  Superimposed process such as demyelinating disease or vasculitis could conceivably be obscured.    Naomie Dean, MD  [facility] [address] [place], [place] [address]  Phone [phone] Fax [phone]

## 2014-06-14 NOTE — Patient Instructions (Addendum)
Overall you are doing fairly well but I do want to suggest a few things today:   Remember to drink plenty of fluid, eat healthy meals and do not skip any meals. Try to eat protein with a every meal and eat a healthy snack such as fruit or nuts in between meals. Try to keep a regular sleep-wake schedule and try to exercise daily, particularly in the form of walking, 20-30 minutes a day, if you can.   As far as your medications are concerned, I would like to suggest: continue current medications  As far as diagnostic testing: MRi brain, MRA head, sleep study  I would like to see you back in 4 months, sooner if we need to. Please call us with any interim questions, concerns, problems, updates or refill requests.   Please also call us for any test results so we can go over those with you on the phone.  My clinical assistant and will answer any of your questions and relay your messages to me and also relay most of my messages to you.   Our phone number is [phone]. We also have an after hours call service for urgent matters and there is a physician on-call for urgent questions. For any emergencies you know to call 911 or go to the nearest emergency room   Pt and speech

## 2014-06-15 ENCOUNTER — Encounter (HOSPITAL_COMMUNITY): Payer: BLUE CROSS/BLUE SHIELD

## 2014-06-16 ENCOUNTER — Telehealth: Payer: Self-pay | Admitting: Family Medicine

## 2014-06-16 ENCOUNTER — Telehealth: Payer: Self-pay | Admitting: *Deleted

## 2014-06-16 NOTE — Telephone Encounter (Signed)
Called wife advised forms ready she will pick up.

## 2014-06-16 NOTE — Telephone Encounter (Signed)
Receive patient records on 06/15/14, records on [place] desk.

## 2014-06-17 ENCOUNTER — Encounter (HOSPITAL_COMMUNITY): Payer: BLUE CROSS/BLUE SHIELD

## 2014-06-18 ENCOUNTER — Telehealth: Payer: Self-pay | Admitting: Medical

## 2014-06-18 NOTE — Telephone Encounter (Signed)
Faxed medical records to disability determination services on Jun 17, 2014 to [phone]

## 2014-06-20 DIAGNOSIS — I619 Nontraumatic intracerebral hemorrhage, unspecified: Secondary | ICD-10-CM | POA: Insufficient documentation

## 2014-06-20 DIAGNOSIS — I10 Essential (primary) hypertension: Secondary | ICD-10-CM | POA: Insufficient documentation

## 2014-06-20 DIAGNOSIS — R51 Headache: Secondary | ICD-10-CM

## 2014-06-20 DIAGNOSIS — R9082 White matter disease, unspecified: Secondary | ICD-10-CM | POA: Insufficient documentation

## 2014-06-20 DIAGNOSIS — R413 Other amnesia: Secondary | ICD-10-CM | POA: Insufficient documentation

## 2014-06-20 DIAGNOSIS — R0683 Snoring: Secondary | ICD-10-CM | POA: Insufficient documentation

## 2014-06-20 DIAGNOSIS — F8081 Childhood onset fluency disorder: Secondary | ICD-10-CM | POA: Insufficient documentation

## 2014-06-20 DIAGNOSIS — I6381 Other cerebral infarction due to occlusion or stenosis of small artery: Secondary | ICD-10-CM | POA: Insufficient documentation

## 2014-06-20 DIAGNOSIS — G4719 Other hypersomnia: Secondary | ICD-10-CM | POA: Insufficient documentation

## 2014-06-20 DIAGNOSIS — E669 Obesity, unspecified: Secondary | ICD-10-CM | POA: Insufficient documentation

## 2014-06-20 DIAGNOSIS — R519 Headache, unspecified: Secondary | ICD-10-CM | POA: Insufficient documentation

## 2014-06-21 ENCOUNTER — Telehealth (HOSPITAL_COMMUNITY): Payer: Self-pay | Admitting: *Deleted

## 2014-06-21 NOTE — Telephone Encounter (Signed)
Patients wife Crystal given detailed instructions per Myocardial Perfusion Study Information Sheet for test on 06/22/14 at 1230. Patient wife verbalized understanding. , Adelene IdlerCynthia W

## 2014-06-22 ENCOUNTER — Encounter: Payer: Self-pay | Admitting: Neurology

## 2014-06-22 ENCOUNTER — Ambulatory Visit (HOSPITAL_COMMUNITY): Payer: BLUE CROSS/BLUE SHIELD | Attending: Cardiovascular Disease

## 2014-06-22 DIAGNOSIS — I10 Essential (primary) hypertension: Secondary | ICD-10-CM | POA: Insufficient documentation

## 2014-06-22 DIAGNOSIS — Z8673 Personal history of transient ischemic attack (TIA), and cerebral infarction without residual deficits: Secondary | ICD-10-CM | POA: Diagnosis not present

## 2014-06-22 MED ORDER — TECHNETIUM TC 99M SESTAMIBI GENERIC - CARDIOLITE
33.0000 | Freq: Once | INTRAVENOUS | Status: AC | PRN
  Administered 2014-06-22: 33 via INTRAVENOUS

## 2014-06-23 ENCOUNTER — Telehealth (HOSPITAL_COMMUNITY): Payer: Self-pay

## 2014-06-23 ENCOUNTER — Ambulatory Visit: Payer: Self-pay | Admitting: Cardiovascular Disease

## 2014-06-23 NOTE — Telephone Encounter (Signed)
Patient given detailed instructions per Myocardial Perfusion Study Information Sheet for test on 06-24-2014 at 8:00am. Patient verbalized understanding. Randa EvensEdwards,  A

## 2014-06-24 ENCOUNTER — Ambulatory Visit (HOSPITAL_COMMUNITY): Payer: BLUE CROSS/BLUE SHIELD

## 2014-06-24 LAB — MYOCARDIAL PERFUSION IMAGING
CHL CUP NUCLEAR SDS: 4
CHL CUP NUCLEAR SSS: 7
CHL CUP RESTING HR STRESS: 66 {beats}/min
CHL CUP STRESS STAGE 1 SPEED: 0 mph
CHL CUP STRESS STAGE 3 HR: 71 {beats}/min
CHL CUP STRESS STAGE 3 SPEED: 0 mph
CHL CUP STRESS STAGE 5 HR: 83 {beats}/min
CHL CUP STRESS STAGE 5 SBP: 116 mmHg
CHL CUP STRESS STAGE 5 SPEED: 0 mph
CHL CUP STRESS STAGE 6 DBP: 66 mmHg
CHL CUP STRESS STAGE 6 HR: 70 {beats}/min
LHR: 0.31
LV dias vol: 206 mL
LV sys vol: 105 mL
NUC STRESS EF: 49 %
Peak HR: 83 {beats}/min
Percent of predicted max HR: 48 %
SRS: 3
Stage 1 DBP: 76 mmHg
Stage 1 Grade: 0 %
Stage 1 HR: 64 {beats}/min
Stage 1 SBP: 114 mmHg
Stage 2 Grade: 0 %
Stage 2 HR: 64 {beats}/min
Stage 2 Speed: 0 mph
Stage 3 DBP: 63 mmHg
Stage 3 Grade: 0 %
Stage 3 SBP: 108 mmHg
Stage 4 Grade: 0 %
Stage 4 HR: 81 {beats}/min
Stage 4 Speed: 0 mph
Stage 5 DBP: 66 mmHg
Stage 5 Grade: 0 %
Stage 6 Grade: 0 %
Stage 6 SBP: 110 mmHg
Stage 6 Speed: 0 mph
TID: 1.07

## 2014-06-24 MED ORDER — TECHNETIUM TC 99M SESTAMIBI GENERIC - CARDIOLITE
33.0000 | Freq: Once | INTRAVENOUS | Status: AC | PRN
  Administered 2014-06-24: 33 via INTRAVENOUS

## 2014-06-29 ENCOUNTER — Telehealth: Payer: Self-pay | Admitting: Medical

## 2014-06-29 NOTE — Telephone Encounter (Signed)
Pt's wife dropped off a form to be completed. She states complete where pink tab is. Please call Crystal at [phone] when ready. Sending back to [place].

## 2014-06-29 NOTE — Telephone Encounter (Signed)
Forms are on your desk.

## 2014-06-30 DIAGNOSIS — Z0279 Encounter for issue of other medical certificate: Secondary | ICD-10-CM

## 2014-06-30 NOTE — Telephone Encounter (Signed)
Short term disability paperwork has been completed.  Please SCAN copy for us.  Return form to patient or employer as requested.    Charge the customary fee for FMLA form completion.

## 2014-07-20 ENCOUNTER — Telehealth: Payer: Self-pay | Admitting: Cardiovascular Disease

## 2014-07-20 ENCOUNTER — Ambulatory Visit (INDEPENDENT_AMBULATORY_CARE_PROVIDER_SITE_OTHER): Payer: BLUE CROSS/BLUE SHIELD | Admitting: Neurology

## 2014-07-20 ENCOUNTER — Encounter: Payer: Self-pay | Admitting: Neurology

## 2014-07-20 VITALS — BP 132/74 | HR 68 | Resp 18 | Ht >= 80 in | Wt 320.0 lb

## 2014-07-20 DIAGNOSIS — R0683 Snoring: Secondary | ICD-10-CM | POA: Diagnosis not present

## 2014-07-20 DIAGNOSIS — F8081 Childhood onset fluency disorder: Secondary | ICD-10-CM

## 2014-07-20 DIAGNOSIS — E669 Obesity, unspecified: Secondary | ICD-10-CM

## 2014-07-20 DIAGNOSIS — I6381 Other cerebral infarction due to occlusion or stenosis of small artery: Secondary | ICD-10-CM

## 2014-07-20 DIAGNOSIS — R351 Nocturia: Secondary | ICD-10-CM

## 2014-07-20 DIAGNOSIS — I639 Cerebral infarction, unspecified: Secondary | ICD-10-CM | POA: Diagnosis not present

## 2014-07-20 DIAGNOSIS — G4719 Other hypersomnia: Secondary | ICD-10-CM

## 2014-07-20 DIAGNOSIS — I619 Nontraumatic intracerebral hemorrhage, unspecified: Secondary | ICD-10-CM

## 2014-07-20 NOTE — Progress Notes (Signed)
Subjective:    Patient ID: Brad [name] is a 54 y.o. male.  HPI     Huston Foley, MD, PhD [facility] [address], [address] [address] [place], [place] [address]  Dear [name],   I saw your patient, Brad [name], upon your kind request in my clinic today for initial consultation of his sleep disorder, in particular, concern for underlying obstructive sleep apnea. The patient is accompanied by his wife today. As you know, [name] is a 54 year old right-handed gentleman with an underlying medical history of hypertension, prior lacunar strokes, recent hemorrhagic stroke in April 2016, recent smoking cessation, and obesity, who snores, has multiple nighttime awakenings, has nocturia and excessive daytime somnolence. His ESS is 20/24. His snoring can be loud according to his wife, who provides most of his history. His bedtime varies. He may be in bed around 10 PM but typically does not fall asleep until late at night. He watches TV in bed. He may fall asleep around 2 AM. He goes to the bathroom multiple times each night, an average of 5 times per night. He denies morning headaches. He's currently not working. He works in Set designer at [employer]. He works 12 hour shifts, from 7 AM to 7 PM. He works in [place]. He lives in [place]. He denies restless leg symptoms. He's not known to twitch in his sleep according to his wife. He denies peripheral edema. He does not drink caffeine every day. He denies alcohol consumption. He quit smoking in April 2016. His rise time varies. He may be up by 5 AM.  His Past Medical History Is Significant For: Past Medical History  Diagnosis Date  . Intraparenchymal hemorrhage of brain 05/05/14    hospitalization [facility]  . Hypertensive heart disease 04/2014  . Stroke 04/2014  . EKG abnormalities 04/2014    poor R wave progression, NSR  . H/O echocardiogram 04/2014    moderate LVH, 60-65% EF, no valve disease  . Abnormal  ultrasound of carotid artery 04/2014    no significant obstruction  . Ataxia 04/2014  . Short-term memory loss 04/2014  . Gait disturbance 04/2014    s/p stroke  . Former smoker     25 years x 1.5 ppd, stopped 04/2014  . History of heart attack     His Past Surgical History Is Significant For: Past Surgical History  Procedure Laterality Date  . Hemorrhoid surgery    . Appendectomy      His Family History Is Significant For: Family History  Problem Relation Age of Onset  . Heart disease Mother   . Hypertension Mother   . Hypertension Brother   . Aneurysm Paternal Grandmother     His Social History Is Significant For: History   Social History  . Marital Status: Married    Spouse Name: [name]  . Number of Children: 2  . Years of Education: 12   Social History Main Topics  . Smoking status: Former Smoker -- 1.50 packs/day for 25 years    Quit date: 05/05/2014  . Smokeless tobacco: Not on file  . Alcohol Use: No  . Drug Use: No  . Sexual Activity: Not on file   Other Topics Concern  . None   Social History Narrative   Lives at home with wife   Married to [name], non denominational, works at [employer] in [place], [place].  From [place], but moved to [place] 2016.     Caffeine use: Drinks tea occass  His Allergies Are:  No Known Allergies:   His Current Medications Are:  Outpatient Encounter Prescriptions as of 07/20/2014  Medication Sig  . amLODipine (NORVASC) 10 MG tablet Take 1 tablet (10 mg total) by mouth daily.  Marland Kitchen atorvastatin (LIPITOR) 20 MG tablet Take 1 tablet (20 mg total) by mouth daily at 6 PM.  . hydrALAZINE (APRESOLINE) 100 MG tablet Take 1 tablet (100 mg total) by mouth 3 (three) times daily.  Marland Kitchen labetalol (NORMODYNE) 200 MG tablet Take 1 tablet (200 mg total) by mouth 2 (two) times daily.  Marland Kitchen losartan (COZAAR) 100 MG tablet Take 1 tablet (100 mg total) by mouth daily.  Marland Kitchen spironolactone (ALDACTONE) 50 MG tablet Take 1 tablet (50 mg  total) by mouth daily.   No facility-administered encounter medications on file as of 07/20/2014.  :  Review of Systems:  Out of a complete 14 point review of systems, all are reviewed and negative with the exception of these symptoms as listed below:   Review of Systems  Musculoskeletal:       Joint pain   Neurological: Positive for numbness and headaches.       Memory loss, stuttering, Snoring, daytime tiredness, sometimes has trouble falling asleep, trouble staying asleep, sometimes wakes up tired, no witnessed apnea, sometimes takes naps during the day. No choking or shortness of breath reported   Psychiatric/Behavioral:       Not enough sleep     Objective:  Neurologic Exam  Physical Exam Physical Examination:   Filed Vitals:   07/20/14 1413  BP: 132/74  Pulse: 68  Resp: 18   General Examination: The patient is a very pleasant 54 y.o. male in no acute distress. He appears well-developed and well-nourished and well groomed.   HEENT: Normocephalic, atraumatic, pupils are equal, round and reactive to light and accommodation. Funduscopic exam is normal with sharp disc margins noted. Extraocular tracking is good without limitation to gaze excursion or nystagmus noted. Normal smooth pursuit is noted. Hearing is grossly intact. Tympanic membranes are clear bilaterally. Face is symmetric with normal facial animation and normal facial sensation. Speech is notable for stuttering, without frank dysarthria noted. There is no hypophonia. There is no lip, neck/head, jaw or voice tremor. Neck is supple with full range of passive and active motion. There are no carotid bruits on auscultation. Oropharynx exam reveals: mild mouth dryness, adequate dental hygiene and marked airway crowding, due to large tongue, larger uvula, and tonsils of 2+, right side a little bigger than left. Mallampati is class III. Tongue protrudes centrally and palate elevates symmetrically. Neck size is 18 inches. He has a  Absent overbite.    Chest: Clear to auscultation without wheezing, rhonchi or crackles noted.  Heart: S1+S2+0, regular and normal without murmurs, rubs or gallops noted.   Abdomen: Soft, non-tender and non-distended with normal bowel sounds appreciated on auscultation.  Extremities: There is no pitting edema in the distal lower extremities bilaterally. Pedal pulses are intact.  Skin: Warm and dry without trophic changes noted. There are no varicose veins.  Musculoskeletal: exam reveals no obvious joint deformities, tenderness or joint swelling or erythema.   Neurologically:  Mental status: The patient is awake, alert and oriented in all 4 spheres. His immediate and remote memory, attention, language skills and fund of knowledge are appropriate. There is no evidence of aphasia, agnosia, apraxia or anomia. Speech is clear with normal prosody and enunciation. Thought process is linear. Mood is normal and affect is constricted.  Cranial nerves  II - XII are as described above under HEENT exam. In addition: shoulder shrug is normal with equal shoulder height noted. Motor exam: Normal bulk, strength and tone is noted. There is no drift, tremor or rebound. Romberg is negative. Reflexes are 2+ throughout. Fine motor skills and coordination: intact with normal finger taps, normal hand movements, normal rapid alternating patting, normal foot taps and normal foot agility.  Cerebellar testing: No dysmetria or intention tremor on finger to nose testing. Heel to shin is unremarkable bilaterally. There is no truncal or gait ataxia.  Sensory exam: intact to light touch in the upper and lower extremities.  Gait, station and balance: He stands with mild difficulty and pushes himself up. No veering to one side is noted. No leaning to one side is noted. Posture is age-appropriate and stance is narrow based. Gait shows normal stride length and normal pace. No problems turning are noted. He turns en bloc. Tandem walk  is not possible, as  he does not feel comfortable.               Assessment and Plan:   In summary, Bienvenido Proehl is a very pleasant 54 y.o.-year old male with an underlying medical history of difficult to control hypertension, on multiple antihypertensives at this time, hyperlipidemia, prior lacunar strokes, recent hemorrhagic stroke in April 2016, recent smoking cessation, and obesity, who snores, has multiple nighttime awakenings, has nocturia and excessive daytime somnolence. His history and physical exam are highly concerning for obstructive sleep apnea (OSA). I had a long chat with the patient and his wife about my findings and the diagnosis of OSA, its prognosis and treatment options. We talked about medical treatments, surgical interventions and non-pharmacological approaches. I explained in particular the risks and ramifications of untreated moderate to severe OSA, especially with respect to developing cardiovascular disease down the Road, including congestive heart failure, difficult to treat hypertension, cardiac arrhythmias, or stroke. Even type 2 diabetes has, in part, been linked to untreated OSA. Symptoms of untreated OSA include daytime sleepiness, memory problems, mood irritability and mood disorder such as depression and anxiety, lack of energy, as well as recurrent headaches, especially morning headaches. We talked about trying to maintain a healthy lifestyle in general, as well as the importance of weight control. I encouraged the patient to eat healthy, exercise daily and keep well hydrated, to keep a scheduled bedtime and wake time routine, to not skip any meals and eat healthy snacks in between meals. I advised the patient not to drive when feeling sleepy. He is currently not driving. I also talked to him about improving his sleep hygiene and allowing for enough sleep time. I recommended the following at this time: sleep study with potential positive airway pressure titration. (We will  score hypopneas at 3% and split the sleep study into diagnostic and treatment portion, if the estimated. 2 hour AHI is >15/h).   I explained the sleep test procedure to the patient and also outlined possible surgical and non-surgical treatment options of OSA, including the use of a custom-made dental device (which would require a referral to a specialist dentist or oral surgeon), upper airway surgical options, such as pillar implants, radiofrequency surgery, tongue base surgery, and UPPP (which would involve a referral to an ENT surgeon). Rarely, jaw surgery such as mandibular advancement may be considered.  I also explained the CPAP treatment option to the patient, who indicated that he would be willing to try CPAP if the need arises. I explained the  importance of being compliant with PAP treatment, not only for insurance purposes but primarily to improve His symptoms, and for the patient's long term health benefit, including to reduce His cardiovascular risks. I answered all their questions today and the patient and wife were in agreement. She asked about his stuttering. I explained to her that stuttering is usually not the result of a stroke. In addition, stuttering is usually not a structural neurological problem.  I would like to see him back after the sleep study is completed and encouraged them to call with any interim questions, concerns, problems or updates.   Thank you very much for allowing me to participate in the care of this nice patient. If I can be of any further assistance to you please do not hesitate to talk to me.  Sincerely,   Huston Foley, MD, PhD

## 2014-07-20 NOTE — Telephone Encounter (Signed)
New message       Want verbal order to continue speech therapy 2 times a week for 9 weeks

## 2014-07-20 NOTE — Telephone Encounter (Signed)
Left message on confidential voice mail of Lurena Joiner at [facility] that speech therapy orders should come from patient's PCP.

## 2014-07-20 NOTE — Patient Instructions (Signed)

## 2014-08-12 ENCOUNTER — Ambulatory Visit (INDEPENDENT_AMBULATORY_CARE_PROVIDER_SITE_OTHER): Payer: BLUE CROSS/BLUE SHIELD | Admitting: Medical

## 2014-08-12 ENCOUNTER — Encounter: Payer: Self-pay | Admitting: Medical

## 2014-08-12 VITALS — BP 138/90 | HR 80 | Resp 15 | Wt 323.0 lb

## 2014-08-12 DIAGNOSIS — Z87891 Personal history of nicotine dependence: Secondary | ICD-10-CM | POA: Diagnosis not present

## 2014-08-12 DIAGNOSIS — R5383 Other fatigue: Secondary | ICD-10-CM

## 2014-08-12 DIAGNOSIS — Z0289 Encounter for other administrative examinations: Secondary | ICD-10-CM

## 2014-08-12 DIAGNOSIS — Z8673 Personal history of transient ischemic attack (TIA), and cerebral infarction without residual deficits: Secondary | ICD-10-CM

## 2014-08-12 DIAGNOSIS — N528 Other male erectile dysfunction: Secondary | ICD-10-CM | POA: Diagnosis not present

## 2014-08-12 DIAGNOSIS — F8081 Childhood onset fluency disorder: Secondary | ICD-10-CM | POA: Diagnosis not present

## 2014-08-12 DIAGNOSIS — M6289 Other specified disorders of muscle: Secondary | ICD-10-CM | POA: Diagnosis not present

## 2014-08-12 DIAGNOSIS — R351 Nocturia: Secondary | ICD-10-CM | POA: Diagnosis not present

## 2014-08-12 DIAGNOSIS — R29818 Other symptoms and signs involving the nervous system: Secondary | ICD-10-CM

## 2014-08-12 DIAGNOSIS — G4730 Sleep apnea, unspecified: Secondary | ICD-10-CM | POA: Diagnosis not present

## 2014-08-12 DIAGNOSIS — R413 Other amnesia: Secondary | ICD-10-CM

## 2014-08-12 DIAGNOSIS — I10 Essential (primary) hypertension: Secondary | ICD-10-CM | POA: Diagnosis not present

## 2014-08-12 DIAGNOSIS — R531 Weakness: Secondary | ICD-10-CM

## 2014-08-12 LAB — CBC
HCT: 35.8 % — ABNORMAL LOW (ref 39.0–52.0)
Hemoglobin: 11.8 g/dL — ABNORMAL LOW (ref 13.0–17.0)
MCH: 28.6 pg (ref 26.0–34.0)
MCHC: 33 g/dL (ref 30.0–36.0)
MCV: 86.7 fL (ref 78.0–100.0)
MPV: 9.9 fL (ref 8.6–12.4)
Platelets: 296 10*3/uL (ref 150–400)
RBC: 4.13 MIL/uL — AB (ref 4.22–5.81)
RDW: 14 % (ref 11.5–15.5)
WBC: 7.1 10*3/uL (ref 4.0–10.5)

## 2014-08-12 LAB — COMPREHENSIVE METABOLIC PANEL
ALBUMIN: 4.1 g/dL (ref 3.5–5.2)
ALK PHOS: 59 U/L (ref 39–117)
ALT: 25 U/L (ref 0–53)
AST: 15 U/L (ref 0–37)
BUN: 18 mg/dL (ref 6–23)
CHLORIDE: 105 meq/L (ref 96–112)
CO2: 24 mEq/L (ref 19–32)
Calcium: 9.6 mg/dL (ref 8.4–10.5)
Creat: 1.5 mg/dL — ABNORMAL HIGH (ref 0.50–1.35)
Glucose, Bld: 173 mg/dL — ABNORMAL HIGH (ref 70–99)
Potassium: 4.2 mEq/L (ref 3.5–5.3)
Sodium: 140 mEq/L (ref 135–145)
Total Bilirubin: 0.4 mg/dL (ref 0.2–1.2)
Total Protein: 7 g/dL (ref 6.0–8.3)

## 2014-08-12 LAB — LIPID PANEL
Cholesterol: 76 mg/dL (ref 0–200)
HDL: 36 mg/dL — AB (ref >=40)
LDL Cholesterol: 29 mg/dL (ref 0–99)
Total CHOL/HDL Ratio: 2.1 Ratio
Triglycerides: 53 mg/dL (ref <150)
VLDL: 11 mg/dL (ref 0–40)

## 2014-08-12 LAB — VITAMIN B12: Vitamin B-12: 835 pg/mL (ref 211–911)

## 2014-08-12 LAB — TSH: TSH: 1.403 u[IU]/mL (ref 0.350–4.500)

## 2014-08-13 ENCOUNTER — Telehealth: Payer: Self-pay | Admitting: Medical

## 2014-08-13 ENCOUNTER — Other Ambulatory Visit: Payer: Self-pay | Admitting: Medical

## 2014-08-13 ENCOUNTER — Encounter: Payer: Self-pay | Admitting: Medical

## 2014-08-13 DIAGNOSIS — Z87891 Personal history of nicotine dependence: Secondary | ICD-10-CM | POA: Insufficient documentation

## 2014-08-13 DIAGNOSIS — R531 Weakness: Secondary | ICD-10-CM | POA: Insufficient documentation

## 2014-08-13 DIAGNOSIS — N529 Male erectile dysfunction, unspecified: Secondary | ICD-10-CM | POA: Insufficient documentation

## 2014-08-13 DIAGNOSIS — Z0289 Encounter for other administrative examinations: Secondary | ICD-10-CM | POA: Insufficient documentation

## 2014-08-13 DIAGNOSIS — N528 Other male erectile dysfunction: Secondary | ICD-10-CM | POA: Insufficient documentation

## 2014-08-13 DIAGNOSIS — Z0279 Encounter for issue of other medical certificate: Secondary | ICD-10-CM

## 2014-08-13 DIAGNOSIS — I10 Essential (primary) hypertension: Secondary | ICD-10-CM | POA: Insufficient documentation

## 2014-08-13 DIAGNOSIS — Z8673 Personal history of transient ischemic attack (TIA), and cerebral infarction without residual deficits: Secondary | ICD-10-CM | POA: Insufficient documentation

## 2014-08-13 DIAGNOSIS — R5383 Other fatigue: Secondary | ICD-10-CM | POA: Insufficient documentation

## 2014-08-13 DIAGNOSIS — R29818 Other symptoms and signs involving the nervous system: Secondary | ICD-10-CM | POA: Insufficient documentation

## 2014-08-13 DIAGNOSIS — R413 Other amnesia: Secondary | ICD-10-CM | POA: Insufficient documentation

## 2014-08-13 DIAGNOSIS — R351 Nocturia: Secondary | ICD-10-CM | POA: Insufficient documentation

## 2014-08-13 LAB — HEMOGLOBIN A1C
Hgb A1c MFr Bld: 7.8 % — ABNORMAL HIGH (ref <5.7)
Mean Plasma Glucose: 177 mg/dL — ABNORMAL HIGH (ref <117)

## 2014-08-13 LAB — PSA: PSA: 0.71 ng/mL (ref <=4.00)

## 2014-08-13 MED ORDER — GLIPIZIDE 5 MG PO TABS: 5.0000 mg | ORAL_TABLET | Freq: Two times a day (BID) | ORAL | Status: DC

## 2014-08-13 NOTE — Progress Notes (Signed)
Subjective: Here for f/u, accompanied by wife, has new short term disability forms for me to complete.  His other FMLA and forms expired earlier this week, and he isn't due back to neurology until 09/15/14.    His main c/o today is worsening stuttering that started a while after the stroke, right sided ongoing weakness since the stroke, not improving;  He c/o difficulty with memory since stroke, fatigue.  He is still getting speech therapy and physical therapy.  He is compliant with BP medication, although wife has to call and remind him to take the medications in the afternoon/lunch time doses.    Overall he is about the same as last visit, frustrated.    Wife wants his prostate lab checked. He declines prostate exam and he doesn't seem all that motivated to check prostate.    No other c/o.    Objective:  BP 138/90 mmHg  Pulse 80  Resp 15  Wt 323 lb (146.512 kg)  General appearance: alert, no distress, WD/WN, tall AA male, obese Neck: supple, no lymphadenopathy, no thyromegaly, no masses, no bruits, no bruits Heart: RRR, normal S1, S2, no murmurs Lungs: CTA bilaterally, no wheezes, rhonchi, or rales Back: non tender Musculoskeletal: nontender, no swelling, no obvious deformity Extremities: no edema, no cyanosis, no clubbing Pulses: 2+ symmetric, upper and lower extremities, normal cap refill Neurological: alert, oriented x 2, seems to have guarded gait, mild ataxia, stuttering.  Right grip strength is minimal.  Right leg and arm 3-4/5 strength compared to left.  No other focal deficits Psychiatric: normal affect, behavior normal, pleasant     Assessment: Encounter Diagnoses  Name Primary?  . Malignant hypertension Yes  . History of stroke   . Other fatigue   . Nocturia   . Right sided weakness   . Stuttering   . Memory loss   . Former smoker   . Encounter for completion of form with patient   . Other male erectile dysfunction   . Suspected sleep apnea     Plan: He has  sleep study scheduled for 09/03/14.   I suspect OSA just as neurology does, and this being treated would likely help improve his BP.  He is on several medications at high doses already.  Will likely need input from cardiology on BP management  We discussed using resistance bands he can purchase OTC to work on controled strengthening exercises as discussed for arms and legs in addition to what he is doing with physical therapy  He will f/u with neurology 08/30/14 as planned and will discuss further option to work on memory, thought processing, speech.  C/t speech therapy for now.    Will work on short term disability forms.  Advised that any additional forms will need to be completed by neurology as they have a better idea of his long term prognosis which may or may not be permanent disability.  Too early to tell.   He is compliant with medication, has remained free from tobacco.   He has no specific BPH symptoms, he is drinking fluids close to bedtime which we discussed.  PSA lab today.   Labs today.   F/u pending labs, form completion.

## 2014-08-13 NOTE — Telephone Encounter (Signed)
Pt's wife, Aggie CosierCrystal, wants some clarification on want pt can do and not do right now until he see neurologist on 8/17. Specifically can pt drive now? Also does pt need a follow up appt here.

## 2014-08-13 NOTE — Telephone Encounter (Signed)
Patient's wife is aware of the message from [facility] PA-C and she understood.

## 2014-08-13 NOTE — Telephone Encounter (Signed)
I haven't had a chance to reviewed his labs yet but typically I would have him f/u in 24mo routine.  He should NOT be driving at this time, nor should he operate machinery or anything that could cause serious harm.  He can walk for exercise, do routine things around the house.   Avoid falls by making sure the house doesn't have clutter or tripping hazards.   F/u with sleep study and neurology visit as planned

## 2014-08-16 ENCOUNTER — Telehealth: Payer: Self-pay | Admitting: Medical

## 2014-08-16 NOTE — Telephone Encounter (Signed)
Please scan/copy and return/fax form.  They will need to complete the first page of one of the forms

## 2014-08-16 NOTE — Telephone Encounter (Signed)
After hearing back from Dr. [name] PopperNasher, lets keep current BP regimen the same, but lets pursue sleep study as planned.  I suspect it will be +, and if so, the BP should improve on CPAP.

## 2014-08-16 NOTE — Telephone Encounter (Signed)
Patient's wife is aware that forms are ready for pickup.

## 2014-08-17 ENCOUNTER — Telehealth: Payer: Self-pay | Admitting: Internal Medicine

## 2014-08-17 NOTE — Telephone Encounter (Signed)
Patient's wife Brad [name] is aware of the message from [facility] Tys[facility] PA-C

## 2014-08-17 NOTE — Telephone Encounter (Signed)
Faxed over medical records to Disability determination services today @ [phone]

## 2014-08-20 ENCOUNTER — Telehealth: Payer: Self-pay | Admitting: Cardiovascular Disease

## 2014-08-20 NOTE — Telephone Encounter (Signed)
Dr. Elease Hashimoto has one of the faxes in his mailbox. Informed Coretta with [facility] that we have one fax and confirmed fax number. She will send other forms. Also let her know that Dr. Elease Hashimoto is not in the office today, but when he returns he will receive the forms.

## 2014-08-20 NOTE — Telephone Encounter (Signed)
Follow up    Calling back to see if Brad [name] received the fax.   Please call to discuss

## 2014-08-20 NOTE — Telephone Encounter (Signed)
He will need to have his primary medical doctor fill out the 485 plan of care forms .

## 2014-08-20 NOTE — Telephone Encounter (Signed)
All faxes have been received and have been placed in Dr. Harvie Bridge mailbox for follow-up.

## 2014-08-20 NOTE — Telephone Encounter (Signed)
Informed [facility] that these care forms need to go to the PCP. They verbalized understanding and will send to Crosby Oyster PA's office.

## 2014-08-20 NOTE — Telephone Encounter (Signed)
Follow Up    Calling to follow up on 485 POC forms faxed over in both June and July. States she has not received any forms back. Please call.

## 2014-08-23 NOTE — Telephone Encounter (Signed)
Note:  I have called [facility] twice in the last month to inform them that these forms needed to go to patient's PCP.  Both times, I got verbal confirmation from a staff member of the message but we continue to receive this paperwork.

## 2014-09-03 ENCOUNTER — Ambulatory Visit (INDEPENDENT_AMBULATORY_CARE_PROVIDER_SITE_OTHER): Payer: BLUE CROSS/BLUE SHIELD | Admitting: Neurology

## 2014-09-03 DIAGNOSIS — G4733 Obstructive sleep apnea (adult) (pediatric): Secondary | ICD-10-CM | POA: Diagnosis not present

## 2014-09-03 DIAGNOSIS — G4730 Sleep apnea, unspecified: Secondary | ICD-10-CM

## 2014-09-03 DIAGNOSIS — R351 Nocturia: Secondary | ICD-10-CM

## 2014-09-03 DIAGNOSIS — R0683 Snoring: Secondary | ICD-10-CM

## 2014-09-03 DIAGNOSIS — E669 Obesity, unspecified: Secondary | ICD-10-CM

## 2014-09-03 DIAGNOSIS — G4710 Hypersomnia, unspecified: Secondary | ICD-10-CM

## 2014-09-03 DIAGNOSIS — G4719 Other hypersomnia: Secondary | ICD-10-CM

## 2014-09-03 NOTE — Sleep Study (Signed)
Please see the scanned sleep study interpretation located in the procedure tab in the chart view section.  

## 2014-09-10 ENCOUNTER — Telehealth: Payer: Self-pay | Admitting: Neurology

## 2014-09-10 DIAGNOSIS — G4733 Obstructive sleep apnea (adult) (pediatric): Secondary | ICD-10-CM

## 2014-09-10 NOTE — Telephone Encounter (Signed)
Dr. Trevor Mace patient, seen by me on 07/20/14, PSG on 09/03/14, Ins: [employer].  Please call and notify the patient that the recent sleep study did confirm the diagnosis of obstructive sleep apnea and that I recommend treatment for this in the form of CPAP. This will require a repeat sleep study for proper titration and mask fitting. Please explain to patient and arrange for a CPAP titration study. I have placed an order in the chart. Thanks, Huston Foley, MD, PhD [facility])

## 2014-09-13 ENCOUNTER — Encounter: Payer: Self-pay | Admitting: Neurology

## 2014-09-13 ENCOUNTER — Ambulatory Visit (INDEPENDENT_AMBULATORY_CARE_PROVIDER_SITE_OTHER): Payer: BLUE CROSS/BLUE SHIELD | Admitting: Neurology

## 2014-09-13 VITALS — BP 125/81 | HR 67 | Ht >= 80 in | Wt 326.6 lb

## 2014-09-13 DIAGNOSIS — R930 Abnormal findings on diagnostic imaging of skull and head, not elsewhere classified: Secondary | ICD-10-CM

## 2014-09-13 DIAGNOSIS — G4733 Obstructive sleep apnea (adult) (pediatric): Secondary | ICD-10-CM | POA: Diagnosis not present

## 2014-09-13 DIAGNOSIS — M6289 Other specified disorders of muscle: Secondary | ICD-10-CM | POA: Diagnosis not present

## 2014-09-13 DIAGNOSIS — IMO0002 Reserved for concepts with insufficient information to code with codable children: Secondary | ICD-10-CM

## 2014-09-13 DIAGNOSIS — I69923 Fluency disorder following unspecified cerebrovascular disease: Secondary | ICD-10-CM

## 2014-09-13 DIAGNOSIS — I639 Cerebral infarction, unspecified: Secondary | ICD-10-CM

## 2014-09-13 DIAGNOSIS — G9349 Other encephalopathy: Secondary | ICD-10-CM

## 2014-09-13 DIAGNOSIS — R531 Weakness: Secondary | ICD-10-CM

## 2014-09-13 DIAGNOSIS — R413 Other amnesia: Secondary | ICD-10-CM | POA: Diagnosis not present

## 2014-09-13 DIAGNOSIS — I619 Nontraumatic intracerebral hemorrhage, unspecified: Secondary | ICD-10-CM | POA: Diagnosis not present

## 2014-09-13 DIAGNOSIS — R9082 White matter disease, unspecified: Secondary | ICD-10-CM

## 2014-09-13 NOTE — Progress Notes (Addendum)
[facility]    Provider: Dr Lucia Gaskins Referring Provider: Jac Canavan, PA-C Primary Care Physician: Ernst Breach, PA-C  Interval history 09/13/2014: Brad [name] is a 54 y.o. male here as a followup. He is here with his wife who provides much of the information. He was diagnosed with hemorrhagic stroke possibly due to uncontrolled HTN but also found to have extensive Bougher matter changes as well as multiple bilteral chronic lacunar infartcts. PMHx of uncontrolled hypertension, prior lacunar strokes, recent hypertensive hemorrhagic stroke in April 2016, recent smoking cessation, and obesity, recently diagnosed with moderate to severe OSA.  He reports stroke, OSA, memory loss, stuttering. He is doing better. Speech therapy comes to the house. There is some improvement. He can't remember anything, he has significant memory loss. They are frustrated with that. She has to give him his medication and constantly remind him. He forgets to take his medication. Memory loss is not improving. He has weakness on the right side since the stroke. He can't even lift ten pound dumbells.Also physical therapy comes to the house. Memory was fine before the stroke. He can't coordinate his thoughts. 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 He doesn't feel like he can go back to work.  Also recommend repeat MRi brain due to worsening right-sided weakness and neurocognitive testing after sleep evaluation.   Discussed at length records reviewed from hospital. He has extensive Pak matter changes on MRI.   HPI: Brad [name] is a 54 y.o. male here as a followup. He is here with his wife who provides much of the information. He was diagnosed with hemorrhagic stroke possibly due to uncontrolled HTN but also found to have extensive Crume matter changes as well as multiple bilteral chronic lacunar infartcts. PMHx of uncontrolled hypertension, prior lacunar  strokes, recent hypertensive hemorrhagic stroke in April 2016, recent smoking cessation, and obesity, recently diagnosed with moderate to severe OSA.   Patient has had high blood pressure, untreated. The stroke happened on April 6th, was disoriented with headache. He had a bad headache and was brought to the ED and found to have hypertensive hemorrhage. He has had uncontrolled HTN for a long time. No resultant weakness. Has stuttering of recent onset. He says he started stuttering 2 weeks ago but if he talks slow and concentrates he can control the stuttering. He is having a lot of memory loss. He is seeing Dr. Eden Emms in cardiology and he has a stress test scheduled. Since he had the stroke, he is haivng a difficult time writing words on paper. The letters and the words are difficult to understand. But he can text fine on his cell phone. He is having right sided numbness. He is left handed. He is in physical therapy, the sensation in the right hand is not the same since the stroke.   He takes naps during the day. He snores heavily. Wife hears arousals during the snoring. He is excessively tired during the day. He describes memory loss.   Reviewed notes, labs and imaging from outside physicians, which showed:   CT of the brain: personally reviewed images and agree with findings:  06/07/2014: Small hyperdense focus in the left thalamus concerning for a small focus of hemorrhage. Prior report from a CT of the brain performed 05/07/2014 mentions no hyperdense abnormality in the left thalamus. Recommend direct comparison with prior outside CT of the brain dated 05/07/2014. Recommend follow-up CT brain in  24-48 hours to evaluate evolution, or sooner if the patient is clinical condition warrants.  06/08/2014: No evidence of parenchymal hemorrhage or extra-axial fluid collection. No mass lesion, mass effect, or midline shift. No CT evidence of acute infarction. Old left thalamic lacunar infarct (series 3/  image 14). Possible chronic right pontine lacunar infarct (series 3/image 7) versus artifact. Old right caudate/basal ganglia lacunar infarct (series 3/image 11). Subcortical Swaim matter and periventricular small vessel ischemic Changes. Cerebral volume is within normal limits. No ventriculomegaly. The visualized paranasal sinuses are essentially clear. The mastoid air cells are unopacified. No evidence of calvarial fracture.  IMPRESSION: No evidence of acute intracranial abnormality.  BMP/CBC unremarkable   Review of Systems: Patient complains of symptoms per HPI as well as the following symptoms: fatigue, swelling, snoring, memory loss, confusion, headahce, numbness, insomnia, sleepiness, snoring, dizziness, joint pain, aching muscles, runny nose, not enough sleep, decreased energy. Pertinent negatives per HPI. All others negative.   Social History   Social History  . Marital Status: Married    Spouse Name: [name]  . Number of Children: 2  . Years of Education: 12   Occupational History  . Not on file.   Social History Main Topics  . Smoking status: Former Smoker -- 1.50 packs/day for 25 years    Quit date: 05/05/2014  . Smokeless tobacco: Not on file  . Alcohol Use: No  . Drug Use: No  . Sexual Activity: Not on file   Other Topics Concern  . Not on file   Social History Narrative   Lives at home with wife   Married to [name], non denominational, works at [employer] in [place], [place].  From [place], but moved to [place] 2016.     Caffeine use: Drinks tea occass    Family History  Problem Relation Age of Onset  . Heart disease Mother   . Hypertension Mother   . Hypertension Brother   . Aneurysm Paternal Grandmother     Past Medical History  Diagnosis Date  . Intraparenchymal hemorrhage of brain 05/05/14    hospitalization [facility]  . Hypertensive heart disease 04/2014  . Stroke 04/2014  . EKG abnormalities 04/2014    poor R  wave progression, NSR  . H/O echocardiogram 04/2014    moderate LVH, 60-65% EF, no valve disease  . Abnormal ultrasound of carotid artery 04/2014    no significant obstruction  . Ataxia 04/2014  . Short-term memory loss 04/2014  . Gait disturbance 04/2014    s/p stroke  . Former smoker     25 years x 1.5 ppd, stopped 04/2014  . History of heart attack     Past Surgical History  Procedure Laterality Date  . Hemorrhoid surgery    . Appendectomy      Current Outpatient Prescriptions  Medication Sig Dispense Refill  . amLODipine (NORVASC) 10 MG tablet Take 1 tablet (10 mg total) by mouth daily. 90 tablet 3  . atorvastatin (LIPITOR) 20 MG tablet Take 1 tablet (20 mg total) by mouth daily at 6 PM. 90 tablet 3  . glipiZIDE (GLUCOTROL) 5 MG tablet Take 1 tablet (5 mg total) by mouth 2 (two) times daily before a meal. 60 tablet 2  . glipiZIDE (GLUCOTROL) 5 MG tablet Take 1 tablet (5 mg total) by mouth 2 (two) times daily before a meal. 60 tablet 2  . hydrALAZINE (APRESOLINE) 100 MG tablet Take 1 tablet (100 mg total) by mouth 3 (three) times daily. 270 tablet  3  . labetalol (NORMODYNE) 200 MG tablet Take 1 tablet (200 mg total) by mouth 2 (two) times daily. 180 tablet 3  . losartan (COZAAR) 100 MG tablet Take 1 tablet (100 mg total) by mouth daily. 90 tablet 3  . spironolactone (ALDACTONE) 50 MG tablet Take 1 tablet (50 mg total) by mouth daily. 90 tablet 3   No current facility-administered medications for this visit.    Allergies as of 09/13/2014  . (No Known Allergies)    Vitals: There were no vitals taken for this visit. Last Weight:  Wt Readings from Last 1 Encounters:  08/12/14 323 lb (146.512 kg)   Last Height:   Ht Readings from Last 1 Encounters:  07/20/14 6' 8.5" (2.045 m)    Physical exam: Exam: Gen: NAD, conversant, well nourised, obese, well groomed  CV: RRR, no MRG. No Carotid Bruits. No peripheral edema, warm, nontender Eyes: Conjunctivae clear  without exudates or hemorrhage  Neuro: Detailed Neurologic Exam  Speech:  Patient has episodes of stuttering which he can control, and when he concentrates and talks slowly his speech is normal; fluent and spontaneous with normal comprehension.  Cognition:  The patient is oriented to person, place, and time;   rCranial Nerves:  The pupils are equal, round, and reactive to light. The fundi are flat. Visual fields are full to finger confrontation. Extraocular movements are intact. Trigeminal sensation is intact and the muscles of mastication are normal. The face is symmetric. The palate elevates in the midline. Hearing intact. Voice is normal. Shoulder shrug is normal. The tongue has normal motion without fasciculations.   Coordination:  Normal finger to nose and heel to shin. Normal rapid alternating movements.   Gait:  Heel-toe and tandem gait are normal.   Motor Observation:  No asymmetry, no atrophy, and no involuntary movements noted. Tone:  Normal muscle tone.   Posture:  Posture is normal. normal erect   Strength:  Strength is 4/V in the right upper and lower limbs.    Sensation: intact to LT   Reflex Exam:  DTR's:  Deep tendon reflexes in the upper and lower extremities are normal bilaterally.  Toes:  The toes are downgoing bilaterally.  Clonus:  Clonus is absent.  Right side today 4/5.     Assessment/Plan: 54 year old male with uncontrolled HTN with a recent HTN hemorrhage with intraventricular extension. CT scan shows: Old left thalamic lacunar infarct. Possible chronic right pontine lacunar infarct. Old right caudate/basal ganglia lacunar infarct. Subcortical Purewal matter and periventricular small vessel ischemic advanced for his age. MRI shows extensive Lunde matter changes and left parietal hemorrhage likely the cause of his acquired neurogenic stuttering. He has right-sided weakness consistent with left-sided stroke.     Patient needs close follow up with primary care for management of vascular risk factors especially blood pressure.  Sleep study confirmed OSA, needs follow up and treatment with cpap. This is a risk factor for stroke.   Will repeat MRI of the brain  Needs neurocognitive testing for dementia, possibly vascular dementia due to stroke and extensive Thurston matter brain disease  Referral to speech therapy for stuttering - ongoing  Left-sided weakness: is in physicla therapy  Notes received from [facility]: Patient presented with hypertensive emergency and non-ST elevated myocardial infarction April 6th of 2016. CBC was unremarkable, BMP with slightly elevated creatinine at 1.1, past medical history of hypertension who presented due to sudden onset of headache and dizziness. At [facility] emergency room noted to have  a left sided intracranial hemorrhage with intraventricular extension. He was hypertensive with systolic blood pressures in the 200s on admission. He was admitted to the ICU and started on Cardene drip. Then on nitroprusside drip. He was seen by neurosurgery and neurology. He was also treated conservatively for type II myocardial infarction with peak troponin 0.71. He did not have any neurologic deficits and had normal gait. His blood pressure was controlled. On discharge recommendations included antihypertensive regimen with monitoring at home. Avoiding aspirin and anticoagulants. Of note, drug screen was positive for barbiturates, methadone and oxycodone but these were given in the hospital prior to drug testing. CT of the head at [facility] showed no dramatic interval change in the size of the acute intraparenchymal hematoma in the left posterior medial parietal and occipital lobes with relatively unchanged regional mass effect. Intraventricular extension of hemorrhage is also relatively stable.   CT angiogram showed acute parenchymal hematoma in the posterior  left cingulate gyrus and deep aspect of left occipital lobe extending into the left lateral ventricle and slightly into the right lateral ventricle without hydrocephalus and without midline shift. No aneurysm of the anterior posterior circulation as demonstrated. The right vertebral artery is not visualized. The left vertebral artery provides subtle inflow to the basilar artery. Large posterior communicating arteries are relatively small P1 segments of the posterior cerebral arteries bilaterally. CT of the head brain completed on 05/07/2014 showed an unchanged 1.7 x 2.9 cm intraparenchymal hemorrhage in the left splenium of the corpus callosum with intraventricular extension into the third and both lateral ventricles greatest involving left lateral ventricle. No hydrocephalus or change in the ventricles compared to previous study. Moderate periventricular decreased attenuation could represent old microvascular ischemic changes or Vizcarrondo matter disease. Old ischemia involving the thalami bilaterally in the anterior basal ganglia region. Old ischemia involving the pons bilaterally greatest on the right.   MRI of the brain with and without contrast showed no dramatic interval change in size of acute intraparenchymal hematoma in the left posterior medial parietal and occipital lobes. There is relatively unchanged regional mass effect. Intraventricular extension of hemorrhage is also relatively stable. Tiny foci of diffusion restriction with ADC signal loss in the deep right frontal Yaun matter and left caudate each measuring 2-3 mm inside. These are just too small to characterize but raise concern for tiny acute infarcts. Extensive T2 hyperintense Urey matter signal changes as described. These likely represent manifestations of chronic microvascular disease. However the extensive changes is relatively out of proportion to patient's age. These can be accelerated in the context of chronic hypertension. Superimposed  process such as demyelinating disease or vasculitis could conceivably be obscured.    Naomie Dean, MD  [facility] [address] [place], [place] [address]  Phone [phone] Fax [phone] Lucia Gaskins, MD  A total of 45 minutes was spent face-to-face with this patient and his wife. Over half this time was spent on counseling patient on the OSA, extensive Cogburn matter changes, memory loss, stutttering, lacuna strokes, hemorrhagic stroke due to HTN diagnosis and different diagnostic and therapeutic options available.

## 2014-09-13 NOTE — Telephone Encounter (Signed)
Left message to call back  

## 2014-09-14 ENCOUNTER — Telehealth: Payer: Self-pay | Admitting: *Deleted

## 2014-09-14 DIAGNOSIS — Z0289 Encounter for other administrative examinations: Secondary | ICD-10-CM

## 2014-09-14 LAB — BASIC METABOLIC PANEL
BUN / CREAT RATIO: 13 (ref 9–20)
BUN: 14 mg/dL (ref 6–24)
CO2: 20 mmol/L (ref 18–29)
CREATININE: 1.11 mg/dL (ref 0.76–1.27)
Calcium: 9.4 mg/dL (ref 8.7–10.2)
Chloride: 104 mmol/L (ref 97–108)
GFR, EST AFRICAN AMERICAN: 88 mL/min/{1.73_m2} (ref >59)
GFR, EST NON AFRICAN AMERICAN: 76 mL/min/{1.73_m2} (ref >59)
Glucose: 146 mg/dL — ABNORMAL HIGH (ref 65–99)
Potassium: 4.6 mmol/L (ref 3.5–5.2)
Sodium: 141 mmol/L (ref 134–144)

## 2014-09-14 NOTE — Telephone Encounter (Signed)
Patient [employer] form on [employer].

## 2014-09-15 ENCOUNTER — Telehealth: Payer: Self-pay | Admitting: Neurology

## 2014-09-15 ENCOUNTER — Ambulatory Visit: Payer: BLUE CROSS/BLUE SHIELD | Admitting: Neurology

## 2014-09-15 ENCOUNTER — Telehealth: Payer: Self-pay | Admitting: *Deleted

## 2014-09-15 NOTE — Telephone Encounter (Addendum)
error 

## 2014-09-15 NOTE — Telephone Encounter (Signed)
-----   Message from Anson Fret, MD sent at 09/15/2014  3:41 PM EDT ----- Let patient know labs normla

## 2014-09-15 NOTE — Telephone Encounter (Signed)
I spoke to pt and let him know lab results were normal.  He wanted me to let his wife know as well.   I LMVM for her on her mobile that labs normal.

## 2014-09-15 NOTE — Telephone Encounter (Signed)
Spoke with pt wife and advised her Dr. Lucia Gaskins has been out of the office and will be back tomorrow. She has 14 days to complete forms. She verbalized understanding.

## 2014-09-15 NOTE — Progress Notes (Signed)
Quick Note:  LMVM for wife and did speak with husband and let him know labs were normal. ______

## 2014-09-15 NOTE — Telephone Encounter (Signed)
Patient's wife called inquiring when forms would be ready. There should be [employer] forms as well as [employer]. Please call when forms are ready. She can be reached at [phone]

## 2014-09-16 ENCOUNTER — Telehealth: Payer: Self-pay

## 2014-09-16 NOTE — Telephone Encounter (Signed)
I spoke to Brad [name], but he asked for me to call his wife to let her know the results.

## 2014-09-16 NOTE — Telephone Encounter (Signed)
Ruthie, with Mr Reifschneider's disability policy called to inquire if you could provide his current work status? And his release to work date?

## 2014-09-16 NOTE — Telephone Encounter (Signed)
After last visit I completed an FMLA to get him through til he saw neurology.   They should have copies of what I completed.     I advised him and on the form that further decisions on disability and return to work or not returning to work would have to be made by neurology.      FYI - I have went as far as I can go with my documentation regarding disability.  Personally I can't for see him returning to work in the next few months for sure since he had a major stroke.

## 2014-09-16 NOTE — Telephone Encounter (Signed)
Left all information on VM Ruthie @ [phone].

## 2014-09-16 NOTE — Telephone Encounter (Signed)
Crisoforo Oxford, RN at 09/13/2014 3:24 PM     Status: Signed       Expand All Collapse All   Left message to call back.             Huston Foley, MD at 09/10/2014 3:09 PM     Status: Signed       Expand All Collapse All   Dr. Trevor Mace patient, seen by me on 07/20/14, PSG on 09/03/14, Ins: [employer].  Please call and notify the patient that the recent sleep study did confirm the diagnosis of obstructive sleep apnea and that I recommend treatment for this in the form of CPAP. This will require a repeat sleep study for proper titration and mask fitting. Please explain to patient and arrange for a CPAP titration study. I have placed an order in the chart. Thanks, Huston Foley, MD, PhD [facility])

## 2014-09-17 NOTE — Telephone Encounter (Signed)
Faxed report to PCP.

## 2014-09-17 NOTE — Telephone Encounter (Signed)
Left message for wife yesterday.

## 2014-09-20 ENCOUNTER — Telehealth: Payer: Self-pay | Admitting: Cardiovascular Disease

## 2014-09-20 NOTE — Telephone Encounter (Signed)
New message      Calling to see if we faxed verbal orders forms back to them.   They faxed 8 forms a while ago and never received it back

## 2014-09-20 NOTE — Telephone Encounter (Signed)
Spoke with Coretta at [facility] and advised her that Dr. Elease Hashimoto is not the PCP.  I advised her that PCP is listed on patient's chart as Brad Oyster, Brad [name] and that he is not in our office.  I made her aware that I have called their office multiple times with this same information.  She verbalized understanding and states she has removed Dr. Harvie Bridge name as PCP; I advised he is patient's cardiologist.

## 2014-09-21 ENCOUNTER — Telehealth: Payer: Self-pay | Admitting: Neurology

## 2014-09-21 ENCOUNTER — Ambulatory Visit (INDEPENDENT_AMBULATORY_CARE_PROVIDER_SITE_OTHER): Payer: Self-pay

## 2014-09-21 DIAGNOSIS — I639 Cerebral infarction, unspecified: Secondary | ICD-10-CM

## 2014-09-21 DIAGNOSIS — R413 Other amnesia: Secondary | ICD-10-CM | POA: Diagnosis not present

## 2014-09-21 DIAGNOSIS — Z0289 Encounter for other administrative examinations: Secondary | ICD-10-CM

## 2014-09-21 DIAGNOSIS — IMO0002 Reserved for concepts with insufficient information to code with codable children: Secondary | ICD-10-CM

## 2014-09-21 NOTE — Telephone Encounter (Signed)
Called pt wife back and told her Dr. Lucia Gaskins not done with paperwork yet. She has 14 days to complete. She verbalized understanding.

## 2014-09-21 NOTE — Telephone Encounter (Signed)
I spoke to wife. Patient is already schedule for titration study. She is aware of results.

## 2014-09-21 NOTE — Telephone Encounter (Signed)
Crystal Milanes returned your call Lafonda Mosses but you were unavailable.

## 2014-09-21 NOTE — Telephone Encounter (Signed)
Please let patient know I will not be able to have the paperwork filled out until next Monday thank you

## 2014-09-21 NOTE — Telephone Encounter (Addendum)
Pt's wife called and is wanting to know about pts insurance paper. Please call her at [phone]

## 2014-09-22 NOTE — Telephone Encounter (Signed)
Left detailed VM to let Crystal know Dr. Lucia Gaskins said she will not be able to have the paperwork filled out until next Monday and we will call her once paperwork is ready. Gave [facility] phone number if she had further questions.

## 2014-09-24 ENCOUNTER — Telehealth: Payer: Self-pay | Admitting: Medical

## 2014-09-24 NOTE — Telephone Encounter (Signed)
Plan follow up appt in a month regarding medications, particularly for blood pressure

## 2014-09-24 NOTE — Telephone Encounter (Signed)
Talked to pt and he stated to call his wife, she handles all his appts. Called Crystal and left message for her to call back. Majd needs an appt her shane for bp follow up.

## 2014-09-27 ENCOUNTER — Encounter: Payer: Self-pay | Admitting: Neurology

## 2014-09-27 ENCOUNTER — Telehealth: Payer: Self-pay | Admitting: Neurology

## 2014-09-27 DIAGNOSIS — R531 Weakness: Secondary | ICD-10-CM | POA: Insufficient documentation

## 2014-09-27 DIAGNOSIS — G4733 Obstructive sleep apnea (adult) (pediatric): Secondary | ICD-10-CM | POA: Insufficient documentation

## 2014-09-27 NOTE — Telephone Encounter (Signed)
Spoke to patient. Informed wife of the MRi of the brain results. Would him to see Dr. Roda Shutters for follow up. Would like Dr. Roda Shutters to comment on his neurogenic stuttering and if his strokes and Beauchamp matter changes are significant enough to consider a vascular dementia. Also would like Dr. Roda Shutters to review his stroke management.   Dr. Roda Shutters - if you agree can we have Katrina call and schedule patient? Thanks.

## 2014-09-28 NOTE — Telephone Encounter (Signed)
Thanks, Katrina!

## 2014-09-28 NOTE — Telephone Encounter (Signed)
Yes, please. Thanks.  Marvel Plan, MD PhD Stroke Neurology 09/28/2014 4:58 PM

## 2014-09-28 NOTE — Telephone Encounter (Signed)
Talk to pts wife Crystal about seeing Dr. Roda Shutters for stroke management and dementia for a second opinion. Crystal just wanted him to be evaluate for a second opinion. Pt schedule for November 02, 2014 at 0100pm.

## 2014-10-01 ENCOUNTER — Encounter: Payer: Self-pay | Admitting: Medical

## 2014-10-01 ENCOUNTER — Ambulatory Visit (INDEPENDENT_AMBULATORY_CARE_PROVIDER_SITE_OTHER): Payer: BLUE CROSS/BLUE SHIELD | Admitting: Medical

## 2014-10-01 VITALS — BP 110/80 | HR 64 | Temp 97.9°F | Ht >= 80 in | Wt 326.6 lb

## 2014-10-01 DIAGNOSIS — E118 Type 2 diabetes mellitus with unspecified complications: Secondary | ICD-10-CM

## 2014-10-01 DIAGNOSIS — Z8673 Personal history of transient ischemic attack (TIA), and cerebral infarction without residual deficits: Secondary | ICD-10-CM

## 2014-10-01 DIAGNOSIS — E786 Lipoprotein deficiency: Secondary | ICD-10-CM | POA: Diagnosis not present

## 2014-10-01 DIAGNOSIS — I119 Hypertensive heart disease without heart failure: Secondary | ICD-10-CM | POA: Diagnosis not present

## 2014-10-01 DIAGNOSIS — R930 Abnormal findings on diagnostic imaging of skull and head, not elsewhere classified: Secondary | ICD-10-CM

## 2014-10-01 DIAGNOSIS — R413 Other amnesia: Secondary | ICD-10-CM | POA: Diagnosis not present

## 2014-10-01 DIAGNOSIS — I6930 Unspecified sequelae of cerebral infarction: Secondary | ICD-10-CM | POA: Diagnosis not present

## 2014-10-01 DIAGNOSIS — IMO0002 Reserved for concepts with insufficient information to code with codable children: Secondary | ICD-10-CM

## 2014-10-01 DIAGNOSIS — E1165 Type 2 diabetes mellitus with hyperglycemia: Secondary | ICD-10-CM

## 2014-10-01 DIAGNOSIS — R9082 White matter disease, unspecified: Secondary | ICD-10-CM

## 2014-10-01 DIAGNOSIS — I69923 Fluency disorder following unspecified cerebrovascular disease: Secondary | ICD-10-CM | POA: Diagnosis not present

## 2014-10-01 DIAGNOSIS — E119 Type 2 diabetes mellitus without complications: Secondary | ICD-10-CM | POA: Insufficient documentation

## 2014-10-01 DIAGNOSIS — G4733 Obstructive sleep apnea (adult) (pediatric): Secondary | ICD-10-CM | POA: Diagnosis not present

## 2014-10-01 MED ORDER — LABETALOL HCL 200 MG PO TABS
200.0000 mg | ORAL_TABLET | Freq: Two times a day (BID) | ORAL | Status: DC
Start: 2014-10-01 — End: 2015-05-23

## 2014-10-01 MED ORDER — HYDRALAZINE HCL 100 MG PO TABS: 100.0000 mg | ORAL_TABLET | Freq: Three times a day (TID) | ORAL | Status: DC

## 2014-10-01 MED ORDER — SPIRONOLACTONE 50 MG PO TABS
50.0000 mg | ORAL_TABLET | Freq: Every day | ORAL | Status: DC
Start: 2014-10-01 — End: 2015-05-23

## 2014-10-01 MED ORDER — AMLODIPINE BESYLATE 10 MG PO TABS: 10.0000 mg | ORAL_TABLET | Freq: Every day | ORAL | Status: DC

## 2014-10-01 MED ORDER — LOSARTAN POTASSIUM 100 MG PO TABS: 100.0000 mg | ORAL_TABLET | Freq: Every day | ORAL | Status: DC

## 2014-10-01 NOTE — Progress Notes (Signed)
Subjective: Chief Complaint  Patient presents with  . Follow-up    weakness, fatigue,memory not good   Here for f/u.   Since last visit he has seen neurology and actually has been referred to a different neurologist for second opinion on management of his condition.   He continues to report poor memory, fatigue, weakness.  OSA - has completed the first part of sleep study.   Goes back 10/08/14 for titration.   Hypertension - Here for follow-up of hypertension.  He is exercising with walking for about 40 minutes, using tension bands to help with strength.  Still feels weak in general.  Speech therapy ended.  He is adherent to a low-salt diet.  Blood pressure is well controlled at home currently.  Cardiac symptoms: none.   Diabetes - wife notes that she never had him start the glipizide.  Instead he has been more careful with diet, is walking for exercise.     Is compliant with Lipitor without c/o.  Past Medical History  Diagnosis Date  . Intraparenchymal hemorrhage of brain 05/05/14    hospitalization [facility]  . Hypertensive heart disease 04/2014  . Stroke 04/2014  . EKG abnormalities 04/2014    poor R wave progression, NSR  . H/O echocardiogram 04/2014    moderate LVH, 60-65% EF, no valve disease  . Abnormal ultrasound of carotid artery 04/2014    no significant obstruction  . Ataxia 04/2014  . Short-term memory loss 04/2014  . Gait disturbance 04/2014    s/p stroke  . Former smoker     25 years x 1.5 ppd, stopped 04/2014  . History of heart attack   . Diabetes    ROS as in subjective  Objective: BP 110/80 mmHg  Pulse 64  Temp(Src) 97.9 F (36.6 C) (Oral)  Ht  (2.057 m)  Wt 326 lb 9.6 oz (148.145 kg)  BMI 35.01 kg/m2  Wt Readings from Last 3 Encounters:  10/01/14 326 lb 9.6 oz (148.145 kg)  09/13/14 326 lb 9.6 oz (148.145 kg)  08/12/14 323 lb (146.512 kg)   BP Readings from Last 3 Encounters:  10/01/14 110/80  09/13/14 125/81  08/12/14 138/90    Gen: wd, wn, nad Heart: RRR, normal s1, s2, no murmur Lungs clear Ext: no edema Pulses normal Psych: pleasant, answers questions appropriately    Assessment: Encounter Diagnoses  Name Primary?  . Hypertensive heart disease without heart failure Yes  . History of stroke   . [place] matter abnormality on MRI of brain   . OSA (obstructive sleep apnea)   . Memory changes   . Sequela, post-stroke   . 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 Diabetes mellitus type 2 with complications, uncontrolled   . Morbid obesity   . Low HDL (under 40)     Plan: BP at goal.  HgbA1C 6.6% today.  He has f/u with CPAP titration soon, this month, has second opinion consult with neurology in October.  Labs today.   Encouraged him to c/o healthy diet, try and cut out 500 cal/day, exercise regularly with walking, avoid salt.   Work on [employer].   He never started Glipizide.   Discussed again diagnosis of diabetes, possible complications, sick visits, foot care, eye care, routine f/u. Advised they monitor his glucose and BP weekly or few times per week.   F/u pending labs  Emeril was seen today for follow-up.  Diagnoses and all orders for this visit:  Hypertensive  heart disease without heart failure -     NMR Lipoprofile with Lipids -     POCT glycosylated hemoglobin (Hb A1C)  History of stroke -     NMR Lipoprofile with Lipids -     POCT glycosylated hemoglobin (Hb A1C)  [place] matter abnormality on MRI of brain -     NMR Lipoprofile with Lipids -     POCT glycosylated hemoglobin (Hb A1C)  OSA (obstructive sleep apnea) -     NMR Lipoprofile with Lipids -     POCT glycosylated hemoglobin (Hb A1C)  Memory changes -     NMR Lipoprofile with Lipids -     POCT glycosylated hemoglobin (Hb A1C)  Sequela, post-stroke -     NMR Lipoprofile with Lipids -     POCT glycosylated hemoglobin (Hb A1C)  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 -     hydrALAZINE (APRESOLINE) 100 MG tablet; Take 1 tablet (100 mg total) by mouth 3 (three) times daily. -     labetalol (NORMODYNE) 200 MG tablet; Take 1 tablet (200 mg total) by mouth 2 (two) times daily. -     losartan (COZAAR) 100 MG tablet; Take 1 tablet (100 mg total) by mouth daily. -     spironolactone (ALDACTONE) 50 MG tablet; Take 1 tablet (50 mg total) by mouth daily.

## 2014-10-05 LAB — NMR LIPOPROFILE WITH LIPIDS
Cholesterol, Total: 84 mg/dL — ABNORMAL LOW (ref 100–199)
HDL Particle Number: 27.3 umol/L — ABNORMAL LOW (ref >=30.5)
HDL Size: 8.9 nm — ABNORMAL LOW (ref >=9.2)
HDL-C: 40 mg/dL (ref >39)
LDL CALC: 34 mg/dL (ref 0–99)
LDL PARTICLE NUMBER: 552 nmol/L (ref <1000)
LDL Size: 20.2 nm (ref >=20.8)
LP-IR SCORE: 37 (ref <=45)
Large HDL-P: <1.3 umol/L — ABNORMAL LOW (ref >=4.8)
Small LDL Particle Number: 277 nmol/L (ref <=527)
TRIGLYCERIDES: 51 mg/dL (ref 0–149)
VLDL SIZE: 46 nm (ref <=46.6)

## 2014-10-05 LAB — HEPATIC FUNCTION PANEL
ALK PHOS: 47 U/L (ref 40–115)
ALT: 20 U/L (ref 9–46)
AST: 16 U/L (ref 10–35)
Albumin: 4.3 g/dL (ref 3.6–5.1)
BILIRUBIN DIRECT: 0.2 mg/dL (ref <=0.2)
BILIRUBIN TOTAL: 0.5 mg/dL (ref 0.2–1.2)
Indirect Bilirubin: 0.3 mg/dL (ref 0.2–1.2)
Total Protein: 6.9 g/dL (ref 6.1–8.1)

## 2014-10-08 ENCOUNTER — Ambulatory Visit (INDEPENDENT_AMBULATORY_CARE_PROVIDER_SITE_OTHER): Payer: BLUE CROSS/BLUE SHIELD | Admitting: Neurology

## 2014-10-08 DIAGNOSIS — G4733 Obstructive sleep apnea (adult) (pediatric): Secondary | ICD-10-CM | POA: Diagnosis not present

## 2014-10-09 NOTE — Sleep Study (Signed)
Please see the scanned sleep study interpretation located in the Procedure tab within the Chart Review section. 

## 2014-10-12 ENCOUNTER — Telehealth: Payer: Self-pay | Admitting: Neurology

## 2014-10-12 DIAGNOSIS — G4733 Obstructive sleep apnea (adult) (pediatric): Secondary | ICD-10-CM

## 2014-10-12 NOTE — Telephone Encounter (Signed)
I spoke to [employer] (patient's wife). She is aware of results and recommendations. She states that patient would like to start CPAP treatment. They request [facility] on [address]. I will fax report to PCP. I will also send patient a letter reminding him to make f/u appt with Dr. Frances Furbish and the importance of compliance.

## 2014-10-12 NOTE — Telephone Encounter (Signed)
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  Please call and inform patient that I have entered an order for treatment with positive airway pressure (PAP) treatment of obstructive sleep apnea (OSA). He did well during the latest sleep study with CPAP. We will, therefore, arrange for a machine for home use through a DME (durable medical equipment) company of His choice; and I will see the patient back in follow-up in about 8-10 weeks. Please also explain to the patient that I will be looking out for compliance data, which can be downloaded from the machine (stored on an SD card, that is inserted in the machine) or via remote access through a modem, that is built into the machine. At the time of the followup appointment we will discuss sleep study results and how it is going with PAP treatment at home. Please advise patient to bring His machine at the time of the first FU visit, even though this is cumbersome. Bringing the machine for every visit after that will likely not be needed, but often helps for the first visit to troubleshoot if needed. Please re-enforce the importance of compliance with treatment and the need for [name] to monitor compliance data - often an insurance requirement and actually good feedback for the patient as far as how they are doing.  Also remind patient, that any interim PAP machine or mask issues should be first addressed with the DME company, as they can often help better with technical and mask fit issues. Please ask if patient has a preference regarding DME company.  Please also make sure, the patient has a follow-up appointment with me in about 8-10 weeks from the setup date, thanks.  Once you have spoken to the patient - and faxed/routed report to PCP and referring MD (if other than PCP), you can close this encounter, thanks,   Huston Foley, MD, PhD [facility] ([facility])

## 2014-11-02 ENCOUNTER — Ambulatory Visit: Payer: Self-pay | Admitting: Neurology

## 2014-11-24 ENCOUNTER — Encounter: Payer: Self-pay | Admitting: Cardiovascular Disease

## 2014-11-24 ENCOUNTER — Ambulatory Visit (INDEPENDENT_AMBULATORY_CARE_PROVIDER_SITE_OTHER): Payer: BLUE CROSS/BLUE SHIELD | Admitting: Cardiovascular Disease

## 2014-11-24 VITALS — BP 130/96 | HR 80 | Ht >= 80 in | Wt 340.1 lb

## 2014-11-24 DIAGNOSIS — I119 Hypertensive heart disease without heart failure: Secondary | ICD-10-CM

## 2014-11-24 DIAGNOSIS — E785 Hyperlipidemia, unspecified: Secondary | ICD-10-CM | POA: Diagnosis not present

## 2014-11-24 MED ORDER — SILDENAFIL CITRATE 100 MG PO TABS: 100.0000 mg | ORAL_TABLET | Freq: Every day | ORAL | Status: DC | PRN

## 2014-11-24 MED ORDER — ATORVASTATIN CALCIUM 20 MG PO TABS: ORAL_TABLET | ORAL | Status: DC

## 2014-11-24 NOTE — Patient Instructions (Addendum)
Medication Instructions:  DECREASE Atorvastatin to 20 mg on Monday, Wed, Fri, Saturday Take Viagra as needed  Labwork: Your physician recommends that you return for lab work in: 3 months   You will need to FAST for this appointment - nothing to eat or drink after midnight the night before except water.   Testing/Procedures: None Ordered   Follow-Up: Your physician wants you to follow-up in: 6 months with Dr. Elease HashimotoNahser.  You will receive a reminder letter in the mail two months in advance. If you don't receive a letter, please call our office to schedule the follow-up appointment.   If you need a refill on your cardiac medications before your next appointment, please call your pharmacy.   Thank you for choosing [facility]! Eligha BridegroomMichelle , RN [phone]

## 2014-11-24 NOTE — Progress Notes (Signed)
Cardiology Office Note   Date:  11/24/2014   ID:  [name], DOB 05/21/1960, MRN [id number]  PCP:  Ernst Breach, PA-C  Cardiologist:   Vesta Mixer, MD   Chief Complaint  Patient presents with  . Hypertension   Problem list: 1. 1. Malignant hypertension - tachycardia gram shows normal left ventricular  systolic function with EF of 60-65% with moderate LVH 2. Intracranial hemorrhage caused by malignant hypertension 3. Non-ST segment elevation myocardial infarction-in the setting of malignant hypertension,  Echo shows normal LF function with EF of 60-65% with no segmental wall abn.    History of Present Illness: Brad [name] is a 54 y.o. male who presents for follow up for malignant hypertension and a type 2 non-ST segment myocardial infarction.  He's been doing fairly well from a cardiac point. He's not had any episodes of chest discomfort. He's currently in rehabilitation for intracranial hemorrhage. His blood pressure is much better controlled.  He has lost quite a bit of weight.   Oct. 26, 2016:  No Cp or dyspnea.  BP is fairly well controlled.     Past Medical History  Diagnosis Date  . Intraparenchymal hemorrhage of brain [facility]) 05/05/14    hospitalization [facility]  . Hypertensive heart disease 04/2014  . Stroke (HCC) 04/2014  . EKG abnormalities 04/2014    poor R wave progression, NSR  . H/O echocardiogram 04/2014    moderate LVH, 60-65% EF, no valve disease  . Abnormal ultrasound of carotid artery 04/2014    no significant obstruction  . Ataxia 04/2014  . Short-term memory loss 04/2014  . Gait disturbance 04/2014    s/p stroke  . Former smoker     25 years x 1.5 ppd, stopped 04/2014  . History of heart attack   . Diabetes [facility])     Past Surgical History  Procedure Laterality Date  . Hemorrhoid surgery    . Appendectomy       Current Outpatient Prescriptions  Medication Sig Dispense Refill  . amLODipine (NORVASC) 10 MG tablet  Take 1 tablet (10 mg total) by mouth daily. 90 tablet 1  . atorvastatin (LIPITOR) 20 MG tablet Take 1 tablet (20 mg total) by mouth daily at 6 PM. 90 tablet 3  . hydrALAZINE (APRESOLINE) 100 MG tablet Take 1 tablet (100 mg total) by mouth 3 (three) times daily. 270 tablet 1  . labetalol (NORMODYNE) 200 MG tablet Take 1 tablet (200 mg total) by mouth 2 (two) times daily. 180 tablet 1  . losartan (COZAAR) 100 MG tablet Take 1 tablet (100 mg total) by mouth daily. 90 tablet 1  . spironolactone (ALDACTONE) 50 MG tablet Take 1 tablet (50 mg total) by mouth daily. 90 tablet 1   No current facility-administered medications for this visit.    Allergies:   Review of patient's allergies indicates no known allergies.    Social History:  The patient  reports that he quit smoking about 6 months ago. He does not have any smokeless tobacco history on file. He reports that he does not drink alcohol or use illicit drugs.   Family History:  The patient's family history includes Aneurysm in his paternal grandmother; Heart disease in his mother; Hypertension in his brother and mother.    ROS:  Please see the history of present illness.    Review of Systems: Constitutional:  denies fever, chills, diaphoresis,   and fatigue. Appetite has not been normal   HEENT: denies photophobia, eye  pain, redness, hearing loss, ear pain, congestion, sore throat, rhinorrhea, sneezing, neck pain, neck stiffness and tinnitus.  Respiratory: denies SOB, DOE, cough, chest tightness, and wheezing.  Cardiovascular: denies chest pain, palpitations and leg swelling.  Gastrointestinal: denies nausea, vomiting, abdominal pain, diarrhea, constipation, blood in stool.  Genitourinary: denies dysuria, urgency, frequency, hematuria, flank pain and difficulty urinating.  Musculoskeletal: denies  myalgias, back pain, joint swelling, arthralgias and gait problem.   Skin: denies pallor, rash and wound.  Neurological: denies dizziness,  seizures, syncope, weakness, light-headedness, numbness and headaches.   Hematological: denies adenopathy, easy bruising, personal or family bleeding history.  Psychiatric/ Behavioral: denies suicidal ideation, mood changes, confusion, nervousness, sleep disturbance and agitation.       All other systems are reviewed and negative.    PHYSICAL EXAM: VS:  BP 130/96 mmHg  Pulse 80  Ht 6\' 9"  (2.057 m)  Wt 340 lb 1.9 oz (154.277 kg)  BMI 36.46 kg/m2  SpO2 96% , BMI Body mass index is 36.46 kg/(m^2). GEN: Well nourished, well developed, in no acute distress HEENT: normal Neck: no JVD, carotid bruits, or masses Cardiac: RRR; no murmurs, rubs, or gallops,no edema  Respiratory:  clear to auscultation bilaterally, normal work of breathing GI: soft, nontender, nondistended, + BS MS: no deformity or atrophy Skin: warm and dry, no rash Neuro:  Strength and sensation are intact Psych: normal   EKG:  EKG is not ordered today. The ekg ordered today demonstrates    Recent Labs: 08/12/2014: Hemoglobin 11.8*; Platelets 296; TSH 1.403 09/13/2014: BUN 14; Creatinine, Ser 1.11; Potassium 4.6; Sodium 141 10/01/2014: ALT 20    Lipid Panel    Component Value Date/Time   CHOL 84* 10/01/2014 0001   CHOL 76 08/12/2014 0001   TRIG 51 10/01/2014 0001   TRIG 53 08/12/2014 0001   HDL 40 10/01/2014 0001   HDL 36* 08/12/2014 0001   CHOLHDL 2.1 08/12/2014 0001   VLDL 11 08/12/2014 0001   LDLCALC 34 10/01/2014 0001   LDLCALC 29 08/12/2014 0001      Wt Readings from Last 3 Encounters:  11/24/14 340 lb 1.9 oz (154.277 kg)  10/01/14 326 lb 9.6 oz (148.145 kg)  09/13/14 326 lb 9.6 oz (148.145 kg)      Other studies Reviewed: Additional studies/ records that were reviewed today include: . Review of the above records demonstrates:    ASSESSMENT AND PLAN:  1.  Malignant hypertension -  Echocardiogram shows normal left ventricular systolic function with EF of 60-65% with moderate LVH.  we had  long discussion about weight loss and improved diet. I suspect that his blood pressure would improve with a better diet. He eats out quite a bit. 2. Intracranial hemorrhage caused by malignant hypertension  3. Non-ST segment elevation myocardial infarction-in the setting of malignant hypertension,  Echo shows normal LF function with EF of 60-65% with no segmental wall abn.  He was started on Atorvastatin for his NSTEMI.   Lipids are actually low.  Has muscle cramps. Will reduce his dose to 20 mg M, W,F, S   Will check lipids in 3 months .  4. ED:  He would like to try Viagra 100 mg a day .    Current medicines are reviewed at length with the patient today.  The patient does not have concerns regarding medicines.  The following changes have been made:  no change  Labs/ tests ordered today include:  No orders of the defined types were placed in this encounter.  Disposition:   FU with me in 6 months. Steffanie Dunn in 1 month      [name], Deloris Ping, MD  11/24/2014 3:29 PM    [facility] [address] [place], [place], [place]  [address] Phone: [phone]; Fax: [phone]   [facility]  [address] [place], [place]  [address] [phone]    Fax [phone]

## 2015-02-03 ENCOUNTER — Ambulatory Visit: Payer: BLUE CROSS/BLUE SHIELD | Admitting: Neurology

## 2015-02-09 ENCOUNTER — Encounter: Payer: Self-pay | Admitting: Neurology

## 2015-02-09 ENCOUNTER — Ambulatory Visit (INDEPENDENT_AMBULATORY_CARE_PROVIDER_SITE_OTHER): Payer: 59 | Admitting: Neurology

## 2015-02-09 VITALS — BP 122/82 | HR 76 | Resp 18 | Ht >= 80 in | Wt 335.0 lb

## 2015-02-09 DIAGNOSIS — G4733 Obstructive sleep apnea (adult) (pediatric): Secondary | ICD-10-CM | POA: Diagnosis not present

## 2015-02-09 DIAGNOSIS — Z9989 Dependence on other enabling machines and devices: Principal | ICD-10-CM

## 2015-02-09 DIAGNOSIS — E669 Obesity, unspecified: Secondary | ICD-10-CM | POA: Diagnosis not present

## 2015-02-09 NOTE — Patient Instructions (Signed)

## 2015-02-09 NOTE — Progress Notes (Signed)
Subjective:    Patient ID: Brad [name] is a 55 y.o. male.  HPI     Interim history:  Brad [name] is a 55 year old right-handed gentleman with an underlying medical history of hypertension, prior lacunar strokes, recent hemorrhagic stroke in April 2016, recent smoking cessation, and obesity, who presents for follow up consultation of his obstructive sleep apnea, after his sleep studies. The patient is unaccompanied today. I first met him on 07/20/2014 at the request of Dr. Jaynee Eagles, at which time the patient reported snoring, multiple nighttime awakenings, nocturia and excessive daytime somnolence. I invited him back for sleep study. He had a baseline sleep study, followed by a CPAP titration study. I went over his test results with him in detail today. His baseline sleep study from 09/03/2014 showed a sleep efficiency of 63.9% with a latency to sleep of 81.5 minutes, and wake after sleep onset of 78 minutes with mild to moderate sleep fragmentation noted. He had an increased percentage of stage II sleep, absence of slow-wave sleep and REM sleep at 26.1% with a REM latency of 56 minutes. He had no significant PLMS, EKG or EEG changes. He had mild to moderate snoring. He did not have any supine sleep. Total AHI was 10 per hour, rising to 20.4 per hour during REM sleep. Average oxygen saturation was 96%, nadir was 90%. Given his sleep related complaints and his history of stroke, I invited him back for a full night CPAP titration study. He had this on 10/08/2014: Sleep efficiency was 73.6% with a latency of sleep of 13.5 minutes and wake after sleep onset of 87 minutes with mild sleep fragmentation noted. He had an increased percentage of stage 2 sleep, absence of slow wave sleep and increased REM sleep at 34.8% with a mildly reduced REM latency of 51.5 minutes. He had no significant PLMS, EKG or EEG changes. Average oxygen saturation was 94%, nadir was 90%. CPAP was titrated from 5 cm to 8 cm. AHI was 0 per hour  on a pressure of 7 cm. Based on his test results I prescribed CPAP therapy for home use.  Today, 02/09/2015: I reviewed his CPAP compliance data from 01/09/2015 through 02/07/2015 which is a total of 30 days during which time he used his machine 27 days with percent used days greater than 4 hours at 80%, indicating very good compliance with an average usage of 4 hours and 52 minutes, residual AHI low at 0.7 per hour, leak at times high with the 95th percentile at 28.7 L/m on a pressure of 7 cm with EPR of 1.  Today, 02/09/2015: He reports doing fairly well, no new symptoms, is able to tolerate CPAP. He is using a nose mask. He did not end up using a fullface mask. He feels that he wakes up rested and his daytime energy is improved. He exercises regularly and is working on weight loss.  Previously:  07/20/2014: He reports snoring, has multiple nighttime awakenings, has nocturia and excessive daytime somnolence. His ESS is 20/24. His snoring can be loud according to his wife, who provides most of his history. His bedtime varies. He may be in bed around 10 PM but typically does not fall asleep until late at night. He watches TV in bed. He may fall asleep around 2 AM. He goes to the bathroom multiple times each night, an average of 5 times per night. He denies morning headaches. He's currently not working. He works in Psychologist, educational at [employer]. He works 12 hour shifts, from  7 AM to 7 PM. He works in [place]. He lives in [place]. He denies restless leg symptoms. He's not known to twitch in his sleep according to his wife. He denies peripheral edema. He does not drink caffeine every day. He denies alcohol consumption. He quit smoking in April 2016. His rise time varies. He may be up by 5 AM.  His Past Medical History Is Significant For: Past Medical History  Diagnosis Date  . Intraparenchymal hemorrhage of brain [facility]) 05/05/14    hospitalization [facility]  . Hypertensive heart disease  04/2014  . Stroke ([place]) 04/2014  . EKG abnormalities 04/2014    poor R wave progression, NSR  . H/O echocardiogram 04/2014    moderate LVH, 60-65% EF, no valve disease  . Abnormal ultrasound of carotid artery 04/2014    no significant obstruction  . Ataxia 04/2014  . Short-term memory loss 04/2014  . Gait disturbance 04/2014    s/p stroke  . Former smoker     25 years x 1.5 ppd, stopped 04/2014  . History of heart attack   . Diabetes ([place])     His Past Surgical History Is Significant For: Past Surgical History  Procedure Laterality Date  . Hemorrhoid surgery    . Appendectomy      His Family History Is Significant For: Family History  Problem Relation Age of Onset  . Heart disease Mother   . Hypertension Mother   . Hypertension Brother   . Aneurysm Paternal Grandmother     His Social History Is Significant For: Social History   Social History  . Marital Status: Married    Spouse Name: [employer]  . Number of Children: 2  . Years of Education: 12   Social History Main Topics  . Smoking status: Former Smoker -- 1.50 packs/day for 25 years    Quit date: 05/05/2014  . Smokeless tobacco: None  . Alcohol Use: No  . Drug Use: No  . Sexual Activity: Not Asked   Other Topics Concern  . None   Social History Narrative   Lives at home with wife   Married to [employer], non denominational, works at [employer] in [place], [place].  From [place], but moved to [place] 2016.     Caffeine use: Drinks tea occass    His Allergies Are:  No Known Allergies:   His Current Medications Are:  Outpatient Encounter Prescriptions as of 02/09/2015  Medication Sig  . amLODipine (NORVASC) 10 MG tablet Take 1 tablet (10 mg total) by mouth daily.  Marland Kitchen atorvastatin (LIPITOR) 20 MG tablet Take on Mon, Wed, Fri, Sat, only  . hydrALAZINE (APRESOLINE) 100 MG tablet Take 1 tablet (100 mg total) by mouth 3 (three) times daily.  Marland Kitchen labetalol (NORMODYNE) 200 MG tablet Take 1 tablet (200 mg  total) by mouth 2 (two) times daily.  Marland Kitchen losartan (COZAAR) 100 MG tablet Take 1 tablet (100 mg total) by mouth daily.  . sildenafil (VIAGRA) 100 MG tablet Take 1 tablet (100 mg total) by mouth daily as needed for erectile dysfunction.  Marland Kitchen spironolactone (ALDACTONE) 50 MG tablet Take 1 tablet (50 mg total) by mouth daily.   No facility-administered encounter medications on file as of 02/09/2015.  :  Review of Systems:  Out of a complete 14 point review of systems, all are reviewed and negative with the exception of these symptoms as listed below:   Review of Systems  Neurological:       Patient is here  for CPAP f/u. States that he is doing well on CPAP. Only concerned about memory loss from stroke in April.     Objective:  Neurologic Exam  Physical Exam Physical Examination:   Filed Vitals:   02/09/15 1409  BP: 122/82  Pulse: 76  Resp: 18   General Examination: The patient is a very pleasant 55 y.o. male in no acute distress. He appears well-developed and well-nourished and well groomed. He is in good spirits today.  HEENT: Normocephalic, atraumatic, pupils are equal, round and reactive to light and accommodation. Extraocular tracking is good without limitation to gaze excursion or nystagmus noted. Normal smooth pursuit is noted. Hearing is grossly intact. Face is symmetric with normal facial animation and normal facial sensation. Speech is notable for stuttering, without frank dysarthria noted. There is no hypophonia. There is no lip, neck/head, jaw or voice tremor. Neck is supple with full range of passive and active motion. There are no carotid bruits on auscultation. Oropharynx exam reveals: mild mouth dryness, adequate dental hygiene and marked airway crowding, due to large tongue, larger uvula, and tonsils of 2+, right side a little bigger than left. Mallampati is class III. Tongue protrudes centrally and palate elevates symmetrically.   Chest: Clear to auscultation without  wheezing, rhonchi or crackles noted.  Heart: S1+S2+0, regular and normal without murmurs, rubs or gallops noted.   Abdomen: Soft, non-tender and non-distended with normal bowel sounds appreciated on auscultation.  Extremities: There is no pitting edema in the distal lower extremities bilaterally. Pedal pulses are intact.  Skin: Warm and dry without trophic changes noted. There are no varicose veins.  Musculoskeletal: exam reveals no obvious joint deformities, tenderness or joint swelling or erythema.   Neurologically:  Mental status: The patient is awake, alert and oriented in all 4 spheres. His immediate and remote memory, attention, language skills and fund of knowledge are appropriate. There is no evidence of aphasia, agnosia, apraxia or anomia. Speech is clear with normal prosody and enunciation. Thought process is linear. Mood is normal and affect is constricted.  Cranial nerves II - XII are as described above under HEENT exam. In addition: shoulder shrug is normal with equal shoulder height noted. Motor exam: Normal bulk, strength and tone is noted. There is no drift, tremor or rebound. Romberg is negative. Reflexes are 1-2+ throughout. Fine motor skills and coordination: intact in the UEs and LEs.  Cerebellar testing: No dysmetria or intention tremor on finger to nose testing. Heel to shin is unremarkable bilaterally. There is no truncal or gait ataxia.  Sensory exam: intact to light touch in the upper and lower extremities.  Gait, station and balance: He stands with mild difficulty and pushes himself up. No veering to one side is noted. No leaning to one side is noted. Posture is age-appropriate and stance is narrow based. Gait shows normal stride length and normal pace. No problems turning are noted. He turns en bloc. Tandem walk is not possible, as he does not feel comfortable.               Assessment and Plan:   In summary, Brad [name] is a very pleasant 55 year old male with an  underlying medical history of difficult to control hypertension, on multiple antihypertensives at this time, hyperlipidemia, prior lacunar strokes, recent hemorrhagic stroke in April 2016, recent smoking cessation, and obesity, whopresents for follow-up consultation of his obstructive sleep apnea, after his sleep studies. He had a baseline sleep study in August 2016 and a CPAP  titration study in September 2016 and we talked about his test results in detail today. He has since then establish treatment on CPAP with good compliance and good results reported. He has been able to lose some weight. He is trying to exercise. He also endorses drinking enough water. He is commended for his progress. He is also congratulated on his treatment adherence. From my end of things, he is doing well. His exam is stable. I explained the importance of being compliant with PAP treatment, not only for insurance purposes but primarily to improve His symptoms, and for the patient's long term health benefit, including to reduce His cardiovascular risks. I would like to see him back in 6 months, sooner if the need arises. He does not have an appointment pending with Dr. Jaynee Eagles at this time and is encouraged to make an appointment for routine follow-up.  I answered all his questions today and the patient was in agreement.  I spent 20 minutes in total face-to-face time with the patient, more than 50% of which was spent in counseling and coordination of care, reviewing test results, reviewing medication and discussing or reviewing the diagnosis of OSA, its prognosis and treatment options.

## 2015-02-22 ENCOUNTER — Other Ambulatory Visit (INDEPENDENT_AMBULATORY_CARE_PROVIDER_SITE_OTHER): Payer: BLUE CROSS/BLUE SHIELD | Admitting: *Deleted

## 2015-02-22 DIAGNOSIS — E785 Hyperlipidemia, unspecified: Secondary | ICD-10-CM

## 2015-02-22 LAB — HEPATIC FUNCTION PANEL
ALK PHOS: 47 U/L (ref 40–115)
ALT: 26 U/L (ref 9–46)
AST: 18 U/L (ref 10–35)
Albumin: 4.1 g/dL (ref 3.6–5.1)
BILIRUBIN DIRECT: 0.1 mg/dL (ref <=0.2)
BILIRUBIN INDIRECT: 0.4 mg/dL (ref 0.2–1.2)
BILIRUBIN TOTAL: 0.5 mg/dL (ref 0.2–1.2)
Total Protein: 6.7 g/dL (ref 6.1–8.1)

## 2015-02-22 LAB — BASIC METABOLIC PANEL
BUN: 16 mg/dL (ref 7–25)
CALCIUM: 9.3 mg/dL (ref 8.6–10.3)
CO2: 20 mmol/L (ref 20–31)
Chloride: 105 mmol/L (ref 98–110)
Creat: 1.31 mg/dL (ref 0.70–1.33)
GLUCOSE: 260 mg/dL — AB (ref 65–99)
Potassium: 4.3 mmol/L (ref 3.5–5.3)
Sodium: 136 mmol/L (ref 135–146)

## 2015-02-22 LAB — LIPID PANEL
CHOL/HDL RATIO: 2.9 ratio (ref <=5.0)
CHOLESTEROL: 101 mg/dL — AB (ref 125–200)
HDL: 35 mg/dL — AB (ref >=40)
LDL Cholesterol: 55 mg/dL (ref <130)
TRIGLYCERIDES: 53 mg/dL (ref <150)
VLDL: 11 mg/dL (ref <30)

## 2015-02-23 ENCOUNTER — Telehealth: Payer: Self-pay | Admitting: Cardiovascular Disease

## 2015-02-23 NOTE — Telephone Encounter (Signed)
New Message   Pt is calling to get lab results from RN

## 2015-02-23 NOTE — Telephone Encounter (Signed)
Reviewed results with patient who verbalized understanding.  Copies to PCP for follow-up of elevated glucose.

## 2015-03-01 ENCOUNTER — Encounter: Payer: Self-pay | Admitting: Medical

## 2015-03-01 ENCOUNTER — Ambulatory Visit (INDEPENDENT_AMBULATORY_CARE_PROVIDER_SITE_OTHER): Payer: 59 | Admitting: Medical

## 2015-03-01 VITALS — BP 106/74 | HR 78 | Wt 328.0 lb

## 2015-03-01 DIAGNOSIS — G4733 Obstructive sleep apnea (adult) (pediatric): Secondary | ICD-10-CM | POA: Diagnosis not present

## 2015-03-01 DIAGNOSIS — I119 Hypertensive heart disease without heart failure: Secondary | ICD-10-CM

## 2015-03-01 DIAGNOSIS — E1165 Type 2 diabetes mellitus with hyperglycemia: Secondary | ICD-10-CM | POA: Diagnosis not present

## 2015-03-01 DIAGNOSIS — E118 Type 2 diabetes mellitus with unspecified complications: Secondary | ICD-10-CM

## 2015-03-01 DIAGNOSIS — Z23 Encounter for immunization: Secondary | ICD-10-CM | POA: Diagnosis not present

## 2015-03-01 DIAGNOSIS — IMO0002 Reserved for concepts with insufficient information to code with codable children: Secondary | ICD-10-CM

## 2015-03-01 DIAGNOSIS — Z9989 Dependence on other enabling machines and devices: Secondary | ICD-10-CM

## 2015-03-01 LAB — CBC
HCT: 38.4 % — ABNORMAL LOW (ref 39.0–52.0)
Hemoglobin: 12.7 g/dL — ABNORMAL LOW (ref 13.0–17.0)
MCH: 28.8 pg (ref 26.0–34.0)
MCHC: 33.1 g/dL (ref 30.0–36.0)
MCV: 87.1 fL (ref 78.0–100.0)
MPV: 10.1 fL (ref 8.6–12.4)
PLATELETS: 303 10*3/uL (ref 150–400)
RBC: 4.41 MIL/uL (ref 4.22–5.81)
RDW: 13.5 % (ref 11.5–15.5)
WBC: 6.5 10*3/uL (ref 4.0–10.5)

## 2015-03-01 NOTE — Progress Notes (Signed)
Subjective: Chief Complaint  Patient presents with  . Follow-up    on labs. pts wife is wondering if magnesium supplements could do this?   Here for f/u.  Last visit 09/2014 for routine med check.   Since last visit saw cardiology and neurology.  Is now on CPAP.   Interesting wife is also on CPAP now.  He is doing overall well.  No more stuttering.  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   He is trying to eat healthy, is walking daily for exercise.   He has no new c/o    Past Medical History  Diagnosis Date  . Intraparenchymal hemorrhage of brain [facility]) 05/05/14    hospitalization [facility]  . Hypertensive heart disease 04/2014  . Stroke ([facility]) 04/2014  . EKG abnormalities 04/2014    poor R wave progression, NSR  . H/O echocardiogram 04/2014    moderate LVH, 60-65% EF, no valve disease  . Abnormal ultrasound of carotid artery 04/2014    no significant obstruction  . Ataxia 04/2014  . Short-term memory loss 04/2014  . Gait disturbance 04/2014    s/p stroke  . Former smoker     25 years x 1.5 ppd, stopped 04/2014  . History of heart attack   . Diabetes ([facility])    ROS as in subjective   Objective: BP 106/74 mmHg  Pulse 78  Wt 328 lb (148.78 kg)  BP Readings from Last 3 Encounters:  03/01/15 106/74  02/09/15 122/82  11/24/14 130/96   Wt Readings from Last 3 Encounters:  03/01/15 328 lb (148.78 kg)  02/09/15 335 lb (151.955 kg)  11/24/14 340 lb 1.9 oz (154.277 kg)   Gen: wd, wn, nad Heart: RRR, normal s1, s2, no murmurs Lungs clear Ext: no edema Pulses normal    Assessment: Encounter Diagnoses  Name Primary?  . Hypertensive heart disease without heart failure Yes  . Uncontrolled type 2 diabetes mellitus with complication, without long-term current use of insulin (HCC)   . Need for Tdap vaccination   . OSA on CPAP     Plan HTN - BP looks good today. C/t same medications, reviewed recent  Cardiology  notes Diabetes type 2 based on prior labs.  He does not claim this as a diagnosis. Is praying for God to heal this area of his life.  Labs today.  discussed his concerns, beliefs.  We will call with lab results and plan. Counseled on the Tdap (tetanus, diptheria, and acellular pertussis) vaccine.  Vaccine information sheet given. Tdap vaccine given after consent obtained. OSA on CPAP - doing well on this  Brad [name] was seen today for follow-up.  Diagnoses and all orders for this visit:  Hypertensive heart disease without heart failure -     CBC -     Hemoglobin A1c -     Microalbumin / creatinine urine ratio  Uncontrolled type 2 diabetes mellitus with complication, without long-term current use of insulin (HCC) -     CBC -     Hemoglobin A1c -     Microalbumin / creatinine urine ratio  Need for Tdap vaccination -     Tdap vaccine greater than or equal to 7yo IM  OSA on CPAP

## 2015-03-02 ENCOUNTER — Telehealth: Payer: Self-pay

## 2015-03-02 LAB — MICROALBUMIN / CREATININE URINE RATIO
Creatinine, Urine: 169 mg/dL (ref 20–370)
Microalb Creat Ratio: 2 mcg/mg creat (ref <30)
Microalb, Ur: 0.3 mg/dL

## 2015-03-02 LAB — HEMOGLOBIN A1C
HEMOGLOBIN A1C: 10.3 % — AB (ref <5.7)
Mean Plasma Glucose: 249 mg/dL — ABNORMAL HIGH (ref <117)

## 2015-03-02 MED ORDER — BLOOD GLUCOSE MONITOR KIT: PACK | Status: DC

## 2015-03-02 NOTE — Telephone Encounter (Signed)
Sent in glucometer

## 2015-03-02 NOTE — Telephone Encounter (Signed)
-----   Message from Jac Canavan, PA-C sent at 03/02/2015  9:16 AM EST ----- Please call him about results.   FYI - yesterday he stated he did NOT have diabetes so not sure how he will react to this.   Let him know first that he still shows mild anemia/slightly low hemoglobin.   Has he had a colonoscopy?  If not, given slight anemia and being over 50, I recommend referral to GI for screening colonoscopy and to evaluate for mild anemia to rule out a GI source of blood loss.  unfortunately his HgbA1C marker suggests that his sugars have been running in the high 200s and into the 300s.  (don't call it diabetes for now).  I recommend we start medication to help lower his sugars, and I recommend we call out glucometer testing supplies so he can start checking his sugars to know where they are running.    If agreeable I'll start him on a medication called Metformin to block some sugar uptake in the liver, and call out Glucometer with testing supplies, checking once daily, #100 with 11 refills.   I certainly recommend diet suggestions as we talked about yesterday, continue walking for exercise, and pray the Shaune Pollack will move on this for him.  Let me know what he wants to do.   (if we start medication, then I need to see him back in [redacted] weeks along with sugar readings).

## 2015-03-07 ENCOUNTER — Telehealth: Payer: Self-pay | Admitting: Medical

## 2015-03-07 NOTE — Telephone Encounter (Signed)
Pt came in and dropped off a form for completion. Sending back supplemental claimant statement from [employer] to be filled out. Please call pt at [phone] when ready.

## 2015-03-08 ENCOUNTER — Telehealth: Payer: Self-pay | Admitting: *Deleted

## 2015-03-08 ENCOUNTER — Telehealth: Payer: Self-pay | Admitting: Medical

## 2015-03-08 NOTE — Telephone Encounter (Signed)
Pt is picking it up to take to neurology

## 2015-03-08 NOTE — Telephone Encounter (Signed)
erro

## 2015-03-08 NOTE — Telephone Encounter (Signed)
The [employer] form on [employer].

## 2015-03-08 NOTE — Telephone Encounter (Signed)
I received a claim statement about work.    The last one of these was done by neurology.   Have they submitted this to neurology?   Did they discuss a time back to work, or what is the plan for work?   I may end up needing them to get this to neurology as this pertains to overall functional status since his stroke and recovery

## 2015-03-08 NOTE — Telephone Encounter (Signed)
FYI: pts wife called and stated that she didn't see why we couldn't fill out the form I explained to her that because it states returning to work dates and other information about work that we do not have the information to fill that part out. The neurologist filled this out prior so they have all the information. She stated they may make her make an appt since it had been a few months, but since they helped him get permanent disability they have all of this information and should be able to do it but told her if they require an appt that is something I do not know. Forms up front for her to come get on her lunch break

## 2015-03-09 ENCOUNTER — Telehealth: Payer: Self-pay | Admitting: *Deleted

## 2015-03-09 DIAGNOSIS — Z0289 Encounter for other administrative examinations: Secondary | ICD-10-CM

## 2015-03-09 NOTE — Telephone Encounter (Signed)
Patient [employer] form on [employer].

## 2015-03-10 ENCOUNTER — Encounter: Payer: Self-pay | Admitting: *Deleted

## 2015-03-10 NOTE — Progress Notes (Signed)
Completed disability forms and sent back to medical records for patient pick up.

## 2015-05-23 ENCOUNTER — Encounter: Payer: Self-pay | Admitting: Cardiovascular Disease

## 2015-05-23 ENCOUNTER — Ambulatory Visit (INDEPENDENT_AMBULATORY_CARE_PROVIDER_SITE_OTHER): Payer: 59 | Admitting: Cardiovascular Disease

## 2015-05-23 VITALS — BP 118/82 | HR 68 | Ht >= 80 in | Wt 330.8 lb

## 2015-05-23 DIAGNOSIS — I10 Essential (primary) hypertension: Secondary | ICD-10-CM

## 2015-05-23 DIAGNOSIS — I119 Hypertensive heart disease without heart failure: Secondary | ICD-10-CM

## 2015-05-23 MED ORDER — LOSARTAN POTASSIUM 100 MG PO TABS: 100.0000 mg | ORAL_TABLET | Freq: Every day | ORAL | Status: DC

## 2015-05-23 MED ORDER — AMLODIPINE BESYLATE 10 MG PO TABS: 10.0000 mg | ORAL_TABLET | Freq: Every day | ORAL | Status: DC

## 2015-05-23 MED ORDER — SPIRONOLACTONE 50 MG PO TABS: 50.0000 mg | ORAL_TABLET | Freq: Every day | ORAL | Status: DC

## 2015-05-23 MED ORDER — LABETALOL HCL 200 MG PO TABS: 200.0000 mg | ORAL_TABLET | Freq: Two times a day (BID) | ORAL | Status: DC

## 2015-05-23 NOTE — Patient Instructions (Signed)
Medication Instructions:  STOP Atorvastatin   Labwork: Your physician recommends that you return for lab work in: 6 months on the day of or a few days before your office visit with Dr. Elease HashimotoNahser.  You will need to FAST for this appointment - nothing to eat or drink after midnight the night before except water.   Testing/Procedures: None Ordered   Follow-Up: Your physician wants you to follow-up in: 6 months with Dr. Elease HashimotoNahser.  You will receive a reminder letter in the mail two months in advance. If you don't receive a letter, please call our office to schedule the follow-up appointment.   If you need a refill on your cardiac medications before your next appointment, please call your pharmacy.   Thank you for choosing [facility]! Eligha BridegroomMichelle , RN [phone]

## 2015-05-23 NOTE — Progress Notes (Signed)
Cardiology Office Note   Date:  05/23/2015   ID:  Brad [name], DOB 26-Sep-1960, MRN [id number]  PCP:  Crisoforo Oxford, PA-C  Cardiologist:   Thayer Headings, MD   Chief Complaint  Patient presents with  . Follow-up   Problem list: 1. 1. Malignant hypertension - tachycardia gram shows normal left ventricular  systolic function with EF of 60-65% with moderate LVH 2. Intracranial hemorrhage caused by malignant hypertension 3. Non-ST segment elevation myocardial infarction-in the setting of malignant hypertension,  Echo shows normal LF function with EF of 60-65% with no segmental wall abn.    History of Present Illness: Brad [name] is a 55 y.o. male who presents for follow up for malignant hypertension and a type 2 non-ST segment myocardial infarction.  He's been doing fairly well from a cardiac point. He's not had any episodes of chest discomfort. He's currently in rehabilitation for intracranial hemorrhage. His blood pressure is much better controlled.  He has lost quite a bit of weight.   Oct. 26, 2016:  No Cp or dyspnea.  BP is fairly well controlled.   May 23, 2015:  Brad [name] is doing well.  BP is well controlled. Is recovering from his CVA  Walking      Past Medical History  Diagnosis Date  . Intraparenchymal hemorrhage of brain [facility]) 05/05/14    hospitalization [facility]  . Hypertensive heart disease 04/2014  . Stroke ([place]) 04/2014  . EKG abnormalities 04/2014    poor R wave progression, NSR  . H/O echocardiogram 04/2014    moderate LVH, 60-65% EF, no valve disease  . Abnormal ultrasound of carotid artery 04/2014    no significant obstruction  . Ataxia 04/2014  . Short-term memory loss 04/2014  . Gait disturbance 04/2014    s/p stroke  . Former smoker     25 years x 1.5 ppd, stopped 04/2014  . History of heart attack   . Diabetes [facility])     Past Surgical History  Procedure Laterality Date  . Hemorrhoid surgery    . Appendectomy        Current Outpatient Prescriptions  Medication Sig Dispense Refill  . amLODipine (NORVASC) 10 MG tablet Take 1 tablet (10 mg total) by mouth daily. 90 tablet 1  . atorvastatin (LIPITOR) 20 MG tablet Take on Mon, Wed, Fri, Sat, only 90 tablet 3  . blood glucose meter kit and supplies KIT Dispense based on patient and insurance preference. Use up to four times daily as directed. (FOR ICD-9 250.00, 250.01). 1 each 0  . hydrALAZINE (APRESOLINE) 100 MG tablet Take 1 tablet (100 mg total) by mouth 3 (three) times daily. 270 tablet 1  . labetalol (NORMODYNE) 200 MG tablet Take 1 tablet (200 mg total) by mouth 2 (two) times daily. 180 tablet 1  . losartan (COZAAR) 100 MG tablet Take 1 tablet (100 mg total) by mouth daily. 90 tablet 1  . sildenafil (VIAGRA) 100 MG tablet Take 1 tablet (100 mg total) by mouth daily as needed for erectile dysfunction. 15 tablet 11  . spironolactone (ALDACTONE) 50 MG tablet Take 1 tablet (50 mg total) by mouth daily. 90 tablet 1   No current facility-administered medications for this visit.    Allergies:   Review of patient's allergies indicates no known allergies.    Social History:  The patient  reports that he quit smoking about 12 months ago. He does not have any smokeless tobacco history on file. He reports that he  does not drink alcohol or use illicit drugs.   Family History:  The patient's family history includes Aneurysm in his paternal grandmother; Heart disease in his mother; Hypertension in his brother and mother.    ROS:  Please see the history of present illness.    Review of Systems: Constitutional:  denies fever, chills, diaphoresis,   and fatigue. Appetite has not been normal   HEENT: denies photophobia, eye pain, redness, hearing loss, ear pain, congestion, sore throat, rhinorrhea, sneezing, neck pain, neck stiffness and tinnitus.  Respiratory: denies SOB, DOE, cough, chest tightness, and wheezing.  Cardiovascular: denies chest pain,  palpitations and leg swelling.  Gastrointestinal: denies nausea, vomiting, abdominal pain, diarrhea, constipation, blood in stool.  Genitourinary: denies dysuria, urgency, frequency, hematuria, flank pain and difficulty urinating.  Musculoskeletal: denies  myalgias, back pain, joint swelling, arthralgias and gait problem.   Skin: denies pallor, rash and wound.  Neurological: denies dizziness, seizures, syncope, weakness, light-headedness, numbness and headaches.   Hematological: denies adenopathy, easy bruising, personal or family bleeding history.  Psychiatric/ Behavioral: denies suicidal ideation, mood changes, confusion, nervousness, sleep disturbance and agitation.       All other systems are reviewed and negative.    PHYSICAL EXAM: VS:  BP 118/82 mmHg  Pulse 68  Ht _0  (2.057 m)  Wt 330 lb 12.8 oz (150.05 kg)  BMI 35.46 kg/m2 , BMI Body mass index is 35.46 kg/(m^2). GEN: Well nourished, well developed, in no acute distress HEENT: normal Neck: no JVD, carotid bruits, or masses Cardiac: RRR; no murmurs, rubs, or gallops,no edema  Respiratory:  clear to auscultation bilaterally, normal work of breathing GI: soft, nontender, nondistended, + BS MS: no deformity or atrophy Skin: warm and dry, no rash Neuro:  Strength and sensation are intact Psych: normal   EKG:  EKG is ordered today. The ekg ordered today demonstrates   NSR at 68.  Normal    Recent Labs: 08/12/2014: TSH 1.403 02/22/2015: ALT 26; BUN 16; Creat 1.31; Potassium 4.3; Sodium 136 03/01/2015: Hemoglobin 12.7*; Platelets 303    Lipid Panel    Component Value Date/Time   CHOL 101* 02/22/2015 0822   CHOL 84* 10/01/2014 0001   TRIG 53 02/22/2015 0822   TRIG 51 10/01/2014 0001   HDL 35* 02/22/2015 0822   HDL 40 10/01/2014 0001   CHOLHDL 2.9 02/22/2015 0822   VLDL 11 02/22/2015 0822   LDLCALC 55 02/22/2015 0822   LDLCALC 34 10/01/2014 0001      Wt Readings from Last 3 Encounters:  05/23/15 330 lb 12.8 oz  (150.05 kg)  03/01/15 328 lb (148.78 kg)  02/09/15 335 lb (151.955 kg)      Other studies Reviewed: Additional studies/ records that were reviewed today include: . Review of the above records demonstrates:    ASSESSMENT AND PLAN:  1.  Malignant hypertension -  Echocardiogram shows normal left ventricular systolic function with EF of 60-65% with moderate LVH. He is doing much better. Exercising some.   Feeling breat   2. Intracranial hemorrhage caused by malignant hypertension  3. Non-ST segment elevation myocardial infarction-in the setting of malignant hypertension,  Echo shows normal LF function with EF of 60-65% with no segmental wall abn.  Lipids are very low.    At this point, I dont think he needs the atorvastatin .   His NSTEMI was caused by a hypertensive urgency .   He did not have stenting.   Will DC and recheck his lipids in 6 months .  4. ED:  He would like to try Viagra 100 mg a day .   Current medicines are reviewed at length with the patient today.  The patient does not have concerns regarding medicines.  The following changes have been made:  no change  Labs/ tests ordered today include:  No orders of the defined types were placed in this encounter.     Disposition:   FU with me in 6 months.     , Wonda Cheng, MD  05/23/2015 8:33 AM    [address], [place], [place]  [address] Phone: [phone]; Fax: [phone]   [name]  [address] [place], [place]  [address] [phone]    Fax [phone]

## 2015-08-09 ENCOUNTER — Ambulatory Visit (INDEPENDENT_AMBULATORY_CARE_PROVIDER_SITE_OTHER): Payer: 59 | Admitting: Neurology

## 2015-08-09 ENCOUNTER — Encounter: Payer: Self-pay | Admitting: Neurology

## 2015-08-09 VITALS — BP 124/72 | HR 70 | Resp 18 | Ht >= 80 in | Wt 328.0 lb

## 2015-08-09 DIAGNOSIS — E669 Obesity, unspecified: Secondary | ICD-10-CM | POA: Diagnosis not present

## 2015-08-09 DIAGNOSIS — G4733 Obstructive sleep apnea (adult) (pediatric): Secondary | ICD-10-CM

## 2015-08-09 DIAGNOSIS — Z9989 Dependence on other enabling machines and devices: Principal | ICD-10-CM

## 2015-08-09 NOTE — Progress Notes (Signed)
Subjective:    Patient ID: Brad [name] is a 55 y.o. male.  HPI     Interim history:   Brad [name] is a 55 year old right-handed gentleman with an underlying medical history of hypertension, prior lacunar strokes, recent hemorrhagic stroke in April 2016, recent smoking cessation, and obesity, who presents for follow up consultation of his mild to moderate obstructive sleep apnea, on CPAP therapy at home. The patient is unaccompanied today. I last saw him on 02/09/2015, at which time he reported doing fairly well. He was tolerating CPAP and he was compliant with treatment. He indicated that he would wake up rested and daytime energy was better. He was working on weight loss and exercising regularly. We talked about is his baseline sleep study results from August 2016 and his CPAP titration study results from September 2016 in detail at the time. He was encouraged to continue with CPAP therapy regularly.  Today, 08/09/2015: I reviewed his CPAP compliance data from 07/09/2015 through 08/07/2015 which is a total of 30 days during which time he used his machine 24 days with percent used days greater than 4 hours at 60%, indicating suboptimal compliance with an average usage for all nights of 3 hours and 3 minutes, residual AHI 1.1 per hour, leak acceptable with the 95th percentile at 15.6 L/m on a pressure of 7 cm with EPR of 1.  Today, 08/09/2015: He reports doing okay, trying to lose weight, exercises regularly by walking in the neighborhood. Does admit that he occasionally skips a night with his CPAP without good reason he admits. He had a recent checkup with his cardiologist in April 2017 and I reviewed the note. He has been doing well in that regard as well and his blood pressure is under control. He is compliant with all his medications but he reports. He needs new CPAP supplies. I will place an order today. Denies any recent chest pain, shortness of breath, headache, new neurological symptoms,  including no new numbness, tingling, weakness and slurring of speech, feels he has come a long way.  Previously:  I first met him on 07/20/2014 at the request of Dr. Jaynee Eagles, at which time the patient reported snoring, multiple nighttime awakenings, nocturia and excessive daytime somnolence. I invited him back for sleep study. He had a baseline sleep study, followed by a CPAP titration study. I went over his test results with him in detail today. His baseline sleep study from 09/03/2014 showed a sleep efficiency of 63.9% with a latency to sleep of 81.5 minutes, and wake after sleep onset of 78 minutes with mild to moderate sleep fragmentation noted. He had an increased percentage of stage II sleep, absence of slow-wave sleep and REM sleep at 26.1% with a REM latency of 56 minutes. He had no significant PLMS, EKG or EEG changes. He had mild to moderate snoring. He did not have any supine sleep. Total AHI was 10 per hour, rising to 20.4 per hour during REM sleep. Average oxygen saturation was 96%, nadir was 90%. Given his sleep related complaints and his history of stroke, I invited him back for a full night CPAP titration study. He had this on 10/08/2014: Sleep efficiency was 73.6% with a latency of sleep of 13.5 minutes and wake after sleep onset of 87 minutes with mild sleep fragmentation noted. He had an increased percentage of stage 2 sleep, absence of slow wave sleep and increased REM sleep at 34.8% with a mildly reduced REM latency of 51.5 minutes. He had no  significant PLMS, EKG or EEG changes. Average oxygen saturation was 94%, nadir was 90%. CPAP was titrated from 5 cm to 8 cm. AHI was 0 per hour on a pressure of 7 cm. Based on his test results I prescribed CPAP therapy for home use.  I reviewed his CPAP compliance data from 01/09/2015 through 02/07/2015 which is a total of 30 days during which time he used his machine 27 days with percent used days greater than 4 hours at 80%, indicating very good  compliance with an average usage of 4 hours and 52 minutes, residual AHI low at 0.7 per hour, leak at times high with the 95th percentile at 28.7 L/m on a pressure of 7 cm with EPR of 1.  07/20/2014: He reports snoring, has multiple nighttime awakenings, has nocturia and excessive daytime somnolence. His ESS is 20/24. His snoring can be loud according to his wife, who provides most of his history. His bedtime varies. He may be in bed around 10 PM but typically does not fall asleep until late at night. He watches TV in bed. He may fall asleep around 2 AM. He goes to the bathroom multiple times each night, an average of 5 times per night. He denies morning headaches. He's currently not working. He works in Psychologist, educational at [employer]. He works 12 hour shifts, from 7 AM to 7 PM. He works in [place]. He lives in [place]. He denies restless leg symptoms. He's not known to twitch in his sleep according to his wife. He denies peripheral edema. He does not drink caffeine every day. He denies alcohol consumption. He quit smoking in April 2016. His rise time varies. He may be up by 5 AM.  His Past Medical History Is Significant For: Past Medical History  Diagnosis Date  . Intraparenchymal hemorrhage of brain [facility]) 05/05/14    hospitalization [facility]  . Hypertensive heart disease 04/2014  . Stroke ([place]) 04/2014  . EKG abnormalities 04/2014    poor R wave progression, NSR  . H/O echocardiogram 04/2014    moderate LVH, 60-65% EF, no valve disease  . Abnormal ultrasound of carotid artery 04/2014    no significant obstruction  . Ataxia 04/2014  . Short-term memory loss 04/2014  . Gait disturbance 04/2014    s/p stroke  . Former smoker     25 years x 1.5 ppd, stopped 04/2014  . History of heart attack   . Diabetes ([place])     His Past Surgical History Is Significant For: Past Surgical History  Procedure Laterality Date  . Hemorrhoid surgery    . Appendectomy      His Family History Is  Significant For: Family History  Problem Relation Age of Onset  . Heart disease Mother   . Hypertension Mother   . Hypertension Brother   . Aneurysm Paternal Grandmother     His Social History Is Significant For: Social History   Social History  . Marital Status: Married    Spouse Name: [employer]  . Number of Children: 2  . Years of Education: 12   Social History Main Topics  . Smoking status: Former Smoker -- 1.50 packs/day for 25 years    Quit date: 05/05/2014  . Smokeless tobacco: None  . Alcohol Use: No  . Drug Use: No  . Sexual Activity: Not Asked   Other Topics Concern  . None   Social History Narrative   Lives at home with wife   Married to [employer], non denominational,  works at [employer] in [place], [place].  From [place], but moved to [place] 2016.     Caffeine use: Drinks tea occass    His Allergies Are:  No Known Allergies:   His Current Medications Are:  Outpatient Encounter Prescriptions as of 08/09/2015  Medication Sig  . amLODipine (NORVASC) 10 MG tablet Take 1 tablet (10 mg total) by mouth daily.  . blood glucose meter kit and supplies KIT Dispense based on patient and insurance preference. Use up to four times daily as directed. (FOR ICD-9 250.00, 250.01).  . hydrALAZINE (APRESOLINE) 100 MG tablet Take 1 tablet (100 mg total) by mouth 3 (three) times daily.  Marland Kitchen labetalol (NORMODYNE) 200 MG tablet Take 1 tablet (200 mg total) by mouth 2 (two) times daily.  Marland Kitchen losartan (COZAAR) 100 MG tablet Take 1 tablet (100 mg total) by mouth daily.  . sildenafil (VIAGRA) 100 MG tablet Take 1 tablet (100 mg total) by mouth daily as needed for erectile dysfunction.  Marland Kitchen spironolactone (ALDACTONE) 50 MG tablet Take 1 tablet (50 mg total) by mouth daily.   No facility-administered encounter medications on file as of 08/09/2015.  :  Review of Systems:  Out of a complete 14 point review of systems, all are reviewed and negative with the exception of these  symptoms as listed below:   Review of Systems  Neurological:       Patient states that he is doing well on CPAP. Needs new supplies.     Objective:  Neurologic Exam  Physical Exam Physical Examination:   Filed Vitals:   08/09/15 1313  BP: 124/72  Pulse: 70  Resp: 18   General Examination: The patient is a very pleasant 55 y.o. male in no acute distress. He appears well-developed and well-nourished and well groomed. He is in good spirits today.  HEENT: Normocephalic, atraumatic, pupils are equal, round and reactive to light and accommodation. Extraocular tracking is good without limitation to gaze excursion or nystagmus noted. Normal smooth pursuit is noted. Hearing is grossly intact. Face is symmetric with normal facial animation and normal facial sensation. Speech is notable for no more stuttering. There is no hypophonia. There is no lip, neck/head, jaw or voice tremor. Neck is supple with full range of passive and active motion. There are no carotid bruits on auscultation. Oropharynx exam reveals: mild mouth dryness, adequate dental hygiene and marked airway crowding, due to large tongue, larger uvula, and tonsils of 2+, right side a little bigger than left. Mallampati is class III. Tongue protrudes centrally and palate elevates symmetrically.   Chest: Clear to auscultation without wheezing, rhonchi or crackles noted.  Heart: S1+S2+0, regular and normal without murmurs, rubs or gallops noted.   Abdomen: Soft, non-tender and non-distended with normal bowel sounds appreciated on auscultation.  Extremities: There is no pitting edema in the distal lower extremities bilaterally, mild distal leg puffiness noted, right more than left, non-pitting. Pedal pulses are intact.  Skin: Warm and dry without trophic changes noted. There are no varicose veins.  Musculoskeletal: exam reveals no obvious joint deformities, tenderness or joint swelling or erythema.   Neurologically:  Mental status:  The patient is awake, alert and oriented in all 4 spheres. His immediate and remote memory, attention, language skills and fund of knowledge are appropriate. There is no evidence of aphasia, agnosia, apraxia or anomia. Speech is clear with normal prosody and enunciation. Thought process is linear. Mood is normal and affect is normal.  Cranial nerves II - XII are as  described above under HEENT exam. In addition: shoulder shrug is normal with equal shoulder height noted. Motor exam: Normal bulk, strength and tone is noted. There is no drift, tremor or rebound. Romberg is negative. Reflexes are 1-2+ throughout. Fine motor skills and coordination: intact in the UEs and LEs.  Sensory exam: intact to light touch, temperature and vibration in the upper and lower extremities.  Gait, station and balance: He stands without difficulty and pushes himself up. No veering to one side is noted. No leaning to one side is noted. Posture is age-appropriate and stance is narrow based. Gait shows normal stride length and normal pace. No problems turning are noted. Tandem walk is slightly challenging, but better than previously.                Assessment and Plan:   In summary, Brad [name] is a very pleasant 55 year old male with an underlying medical history of difficult to control hypertension, on multiple antihypertensives, hyperlipidemia, prior lacunar strokes, recent hemorrhagic stroke in April 2016, recent smoking cessation in 2016, and obesity, who presents for follow-up consultation of his obstructive sleep apnea, On CPAP therapy at 7 cm. He has had a baseline sleep study in August 2016, followed by a CPAP titration study in September 2016. We have previously discussed his test results in detail and reviewed his most recent compliance data today. He has had slightly suboptimal compliance recently, has skipped a few days here and there but overall feels that he can continue with treatment. He is working on weight loss. He  is commended for his treatment adherence and encouraged to be fully compliant with CPAP therapy and all his medications. His physical exam and neurological exam have improved even. From my end of things, I suggested a one-year checkup for sleep apnea, sooner as needed. I entered an order for CPAP supplies. I answered all his questions today and he was in agreement. He can see Dr. Jaynee Eagles if needed, an appointment is not pending at this time. I answered all his questions today and the patient was in agreement.  I spent 25 minutes in total face-to-face time with the patient, more than 50% of which was spent in counseling and coordination of care, reviewing test results, reviewing medication and discussing or reviewing the diagnosis of OSA, its prognosis and treatment options.

## 2015-08-09 NOTE — Patient Instructions (Signed)
Please continue using your CPAP regularly. While your insurance requires that you use CPAP at least 4 hours each night on 70% of the nights, I recommend, that you not skip any nights and use it throughout the night if you can. Getting used to CPAP and staying with the treatment long term does take time and patience and discipline. Untreated obstructive sleep apnea when it is moderate to severe can have an adverse impact on cardiovascular health and raise her risk for heart disease, arrhythmias, hypertension, congestive heart failure, stroke and diabetes. Untreated obstructive sleep apnea causes sleep disruption, nonrestorative sleep, and sleep deprivation. This can have an impact on your day to day functioning and cause daytime sleepiness and impairment of cognitive function, memory loss, mood disturbance, and problems focussing. Using CPAP regularly can improve these symptoms.  Please continue to work on weight loss and try to be fully compliant with CPAP.   I will see you back in 1 year for sleep apnea check up. You should receive new supplies.

## 2015-10-02 ENCOUNTER — Other Ambulatory Visit: Payer: Self-pay | Admitting: Medical

## 2015-10-04 NOTE — Telephone Encounter (Signed)
Needs visit before next refill.

## 2015-11-21 ENCOUNTER — Encounter: Payer: Self-pay | Admitting: Cardiovascular Disease

## 2015-11-21 ENCOUNTER — Ambulatory Visit (INDEPENDENT_AMBULATORY_CARE_PROVIDER_SITE_OTHER): Payer: 59 | Admitting: Cardiovascular Disease

## 2015-11-21 VITALS — BP 100/70 | HR 60 | Ht >= 80 in | Wt 331.8 lb

## 2015-11-21 DIAGNOSIS — E785 Hyperlipidemia, unspecified: Secondary | ICD-10-CM

## 2015-11-21 DIAGNOSIS — I119 Hypertensive heart disease without heart failure: Secondary | ICD-10-CM

## 2015-11-21 LAB — COMPREHENSIVE METABOLIC PANEL
ALBUMIN: 4.2 g/dL (ref 3.6–5.1)
ALT: 25 U/L (ref 9–46)
AST: 20 U/L (ref 10–35)
Alkaline Phosphatase: 39 U/L — ABNORMAL LOW (ref 40–115)
BILIRUBIN TOTAL: 0.6 mg/dL (ref 0.2–1.2)
BUN: 19 mg/dL (ref 7–25)
CHLORIDE: 107 mmol/L (ref 98–110)
CO2: 24 mmol/L (ref 20–31)
CREATININE: 1.41 mg/dL — AB (ref 0.70–1.33)
Calcium: 9.3 mg/dL (ref 8.6–10.3)
Glucose, Bld: 93 mg/dL (ref 65–99)
Potassium: 4.5 mmol/L (ref 3.5–5.3)
SODIUM: 139 mmol/L (ref 135–146)
Total Protein: 6.7 g/dL (ref 6.1–8.1)

## 2015-11-21 LAB — LIPID PANEL
Cholesterol: 109 mg/dL — ABNORMAL LOW (ref 125–200)
HDL: 42 mg/dL (ref >=40)
LDL CALC: 58 mg/dL (ref <130)
TRIGLYCERIDES: 46 mg/dL (ref <150)
Total CHOL/HDL Ratio: 2.6 Ratio (ref <=5.0)
VLDL: 9 mg/dL (ref <30)

## 2015-11-21 NOTE — Patient Instructions (Addendum)
Medication Instructions:  STOP Hydralazine - take 1/2 pill for approximately 1 month and then then stop   Labwork: TODAY - cholesterol, complete metabolic panel  Your physician recommends that you return for lab work in: 3 months at your next office visit   Testing/Procedures: None Ordered   Follow-Up: Your physician recommends that you schedule a follow-up appointment in: 3 months with Dr. Elease HashimotoNahser   If you need a refill on your cardiac medications before your next appointment, please call your pharmacy.   Thank you for choosing [facility]! Eligha BridegroomMichelle Swinyer, RN [phone]

## 2015-11-21 NOTE — Progress Notes (Signed)
Cardiology Office Note   Date:  11/21/2015   ID:  Brad [name], DOB Oct 22, 1960, MRN [id number]  PCP:  Crisoforo Oxford, PA-C  Cardiologist:   Mertie Moores, MD   Chief Complaint  Patient presents with  . Follow-up   Problem list: 1. 1. Malignant hypertension - tachycardia gram shows normal left ventricular  systolic function with EF of 60-65% with moderate LVH 2. Intracranial hemorrhage caused by malignant hypertension 3. Non-ST segment elevation myocardial infarction-in the setting of malignant hypertension,  Echo shows normal LF function with EF of 60-65% with no segmental wall abn.    History of Present Illness: Brad [name] is a 55 y.o. male who presents for follow up for malignant hypertension and a type 2 non-ST segment myocardial infarction.  He's been doing fairly well from a cardiac point. He's not had any episodes of chest discomfort. He's currently in rehabilitation for intracranial hemorrhage. His blood pressure is much better controlled.  He has lost quite a bit of weight.   Oct. 26, 2016:  No Cp or dyspnea.  BP is fairly well controlled.   May 23, 2015:  Pat is doing well.  BP is well controlled. Is recovering from his CVA  Walking    OCt. 23, 2017:   No CP or dyspnea.  Echo  Form [place], May 06, 2014 - Normal left ventricle systolic function - ejection fraction 60-65%. He has concentric left ventricular hypertrophy.  Works out every day   Past Medical History:  Diagnosis Date  . Abnormal ultrasound of carotid artery 04/2014   no significant obstruction  . Ataxia 04/2014  . Diabetes ([employer])   . EKG abnormalities 04/2014   poor R wave progression, NSR  . Former smoker    25 years x 1.5 ppd, stopped 04/2014  . Gait disturbance 04/2014   s/p stroke  . H/O echocardiogram 04/2014   moderate LVH, 60-65% EF, no valve disease  . History of heart attack   . Hypertensive heart disease 04/2014  . Intraparenchymal hemorrhage of brain [facility]) 05/05/14   hospitalization [facility]  . Short-term memory loss 04/2014  . Stroke [facility]) 04/2014    Past Surgical History:  Procedure Laterality Date  . APPENDECTOMY    . HEMORRHOID SURGERY       Current Outpatient Prescriptions  Medication Sig Dispense Refill  . amLODipine (NORVASC) 10 MG tablet Take 1 tablet (10 mg total) by mouth daily. 90 tablet 3  . blood glucose meter kit and supplies KIT Dispense based on patient and insurance preference. Use up to four times daily as directed. (FOR ICD-9 250.00, 250.01). 1 each 0  . hydrALAZINE (APRESOLINE) 100 MG tablet take 1 tablet by mouth three times a day 270 tablet 0  . labetalol (NORMODYNE) 200 MG tablet Take 1 tablet (200 mg total) by mouth 2 (two) times daily. 180 tablet 3  . losartan (COZAAR) 100 MG tablet Take 1 tablet (100 mg total) by mouth daily. 90 tablet 3  . sildenafil (VIAGRA) 100 MG tablet Take 1 tablet (100 mg total) by mouth daily as needed for erectile dysfunction. 15 tablet 11  . spironolactone (ALDACTONE) 50 MG tablet Take 1 tablet (50 mg total) by mouth daily. 90 tablet 3   No current facility-administered medications for this visit.     Allergies:   Review of patient's allergies indicates no known allergies.    Social History:  The patient  reports that he quit smoking about 18 months ago. He has a  37.50 pack-year smoking history. He does not have any smokeless tobacco history on file. He reports that he does not drink alcohol or use drugs.   Family History:  The patient's family history includes Aneurysm in his paternal grandmother; Heart disease in his mother; Hypertension in his brother and mother.    ROS:  Please see the history of present illness.    Review of Systems: Constitutional:  denies fever, chills, diaphoresis,   and fatigue. Appetite has not been normal   HEENT: denies photophobia, eye pain, redness, hearing loss, ear pain, congestion, sore throat, rhinorrhea, sneezing, neck pain, neck stiffness  and tinnitus.  Respiratory: denies SOB, DOE, cough, chest tightness, and wheezing.  Cardiovascular: denies chest pain, palpitations and leg swelling.  Gastrointestinal: denies nausea, vomiting, abdominal pain, diarrhea, constipation, blood in stool.  Genitourinary: denies dysuria, urgency, frequency, hematuria, flank pain and difficulty urinating.  Musculoskeletal: denies  myalgias, back pain, joint swelling, arthralgias and gait problem.   Skin: denies pallor, rash and wound.  Neurological: denies dizziness, seizures, syncope, weakness, light-headedness, numbness and headaches.   Hematological: denies adenopathy, easy bruising, personal or family bleeding history.  Psychiatric/ Behavioral: denies suicidal ideation, mood changes, confusion, nervousness, sleep disturbance and agitation.       All other systems are reviewed and negative.    PHYSICAL EXAM: VS:  BP 100/70 (BP Location: Right Arm, Patient Position: Sitting, Cuff Size: Large)   Pulse 60   Ht 6' 9" (2.057 m)   Wt (!) 331 lb 12.8 oz (150.5 kg)   BMI 35.56 kg/m  , BMI Body mass index is 35.56 kg/m. GEN: Well nourished, well developed, in no acute distress  HEENT: normal  Neck: no JVD, carotid bruits, or masses Cardiac: RRR; no murmurs, rubs, or gallops,no edema  Respiratory:  clear to auscultation bilaterally, normal work of breathing GI: soft, nontender, nondistended, + BS MS: no deformity or atrophy  Skin: warm and dry, no rash Neuro:  Strength and sensation are intact Psych: normal   EKG:  EKG is ordered today. The ekg ordered today demonstrates   NSR at 68.  Normal    Recent Labs: 02/22/2015: ALT 26; BUN 16; Creat 1.31; Potassium 4.3; Sodium 136 03/01/2015: Hemoglobin 12.7; Platelets 303    Lipid Panel    Component Value Date/Time   CHOL 101 (L) 02/22/2015 0822   CHOL 84 (L) 10/01/2014 0001   TRIG 53 02/22/2015 0822   TRIG 51 10/01/2014 0001   HDL 35 (L) 02/22/2015 0822   HDL 40 10/01/2014 0001    CHOLHDL 2.9 02/22/2015 0822   VLDL 11 02/22/2015 0822   LDLCALC 55 02/22/2015 0822   LDLCALC 34 10/01/2014 0001      Wt Readings from Last 3 Encounters:  11/21/15 (!) 331 lb 12.8 oz (150.5 kg)  08/09/15 (!) 328 lb (148.8 kg)  05/23/15 (!) 330 lb 12.8 oz (150 kg)      Other studies Reviewed: Additional studies/ records that were reviewed today include: . Review of the above records demonstrates:    ASSESSMENT AND PLAN:  1.  Malignant hypertension -  Echocardiogram shows normal left ventricular systolic function with EF of 60-65% with moderate LVH. He is doing much better. Exercising some.   BP is well controlled. Will Decrease the hydralazine to 50 mg 3 times a day for the next 2-3 weeks and then he'll stop it completely 2 weeks after that. He'll continue to measure his blood pressure readings will call us if his blood pressure increases. I  will see him again in 3 months for follow-up visit.  2. Intracranial hemorrhage caused by malignant hypertension  3. Non-ST segment elevation myocardial infarction-in the setting of malignant hypertension,  Echo shows normal LF function with EF of 60-65% with no segmental wall abn.  Lipids are very low.    At this point, I dont think he needs the atorvastatin .   His NSTEMI was caused by a hypertensive urgency .   He did not have stenting.    recheck his lipids today    4. ED:  He would like to try Viagra 100 mg a day .   Current medicines are reviewed at length with the patient today.  The patient does not have concerns regarding medicines.  The following changes have been made:  no change  Labs/ tests ordered today include:  No orders of the defined types were placed in this encounter.    Disposition:   FU with me in 3 months .     Mertie Moores, MD  11/21/2015 10:59 AM    [facility] [place], [place], [place]  [address] Phone: [phone]; Fax: [phone]

## 2015-11-22 ENCOUNTER — Telehealth: Payer: Self-pay | Admitting: Medical

## 2015-11-22 NOTE — Telephone Encounter (Signed)
Pt called made an appt for tomorrow.

## 2015-11-22 NOTE — Telephone Encounter (Signed)
Called and left message on voice mail to call to schedule a appt

## 2015-11-22 NOTE — Telephone Encounter (Signed)
Please schedule him f/u on abnormal blood sugar

## 2015-11-23 ENCOUNTER — Ambulatory Visit: Payer: 59 | Admitting: Medical

## 2015-11-24 ENCOUNTER — Encounter: Payer: Self-pay | Admitting: Medical

## 2015-11-24 ENCOUNTER — Ambulatory Visit (INDEPENDENT_AMBULATORY_CARE_PROVIDER_SITE_OTHER): Payer: 59 | Admitting: Medical

## 2015-11-24 VITALS — BP 130/80 | HR 68 | Resp 16 | Ht 78.75 in | Wt 333.2 lb

## 2015-11-24 DIAGNOSIS — E786 Lipoprotein deficiency: Secondary | ICD-10-CM

## 2015-11-24 DIAGNOSIS — Z2821 Immunization not carried out because of patient refusal: Secondary | ICD-10-CM | POA: Diagnosis not present

## 2015-11-24 DIAGNOSIS — G4733 Obstructive sleep apnea (adult) (pediatric): Secondary | ICD-10-CM

## 2015-11-24 DIAGNOSIS — IMO0002 Reserved for concepts with insufficient information to code with codable children: Secondary | ICD-10-CM

## 2015-11-24 DIAGNOSIS — E1165 Type 2 diabetes mellitus with hyperglycemia: Secondary | ICD-10-CM | POA: Diagnosis not present

## 2015-11-24 DIAGNOSIS — I119 Hypertensive heart disease without heart failure: Secondary | ICD-10-CM

## 2015-11-24 DIAGNOSIS — E118 Type 2 diabetes mellitus with unspecified complications: Secondary | ICD-10-CM | POA: Diagnosis not present

## 2015-11-24 DIAGNOSIS — Z8673 Personal history of transient ischemic attack (TIA), and cerebral infarction without residual deficits: Secondary | ICD-10-CM

## 2015-11-24 DIAGNOSIS — R7989 Other specified abnormal findings of blood chemistry: Secondary | ICD-10-CM | POA: Diagnosis not present

## 2015-11-24 LAB — POCT GLYCOSYLATED HEMOGLOBIN (HGB A1C): Hemoglobin A1C: 6.2

## 2015-11-24 NOTE — Progress Notes (Signed)
Subjective: Chief Complaint  Patient presents with  . Diabetes    reports that he is coming off hydralazine per instructions by Dr. Cathie Olden   Here for f/u. Has hx/o significant for stroke, diabetes although he didn't claim this diagnosis last visit, hx/o HTN, hx/o malignant hypertension, hypertensive heart disease, NSTEMI.     Exercise - daily.  Does cardio daily with treadmill, 5 miles daily.  checking BP numbers daily and they look good.  Has glucometer, checking sugars as well.  Since last visit here has really been working hard on diet.   checking fasting glucase in the morning, mostly 115s.  avoiding sweets, soda, sweet tea.    Not working.  On disability due to the stroke.  Since last visit has really worked hard to get his physical shape in check.  Not having speech issues currently, exercise daily.     Not smoking, not drinking.   Is a Environmental education officer.  Wife works with [employer]    Past Medical History:  Diagnosis Date  . Abnormal ultrasound of carotid artery 04/2014   no significant obstruction  . Ataxia 04/2014  . Diabetes ([place])   . EKG abnormalities 04/2014   poor R wave progression, NSR  . Former smoker    25 years x 1.5 ppd, stopped 04/2014  . Gait disturbance 04/2014   s/p stroke  . H/O echocardiogram 04/2014   moderate LVH, 60-65% EF, no valve disease  . History of heart attack   . Hypertensive heart disease 04/2014  . Intraparenchymal hemorrhage of brain [facility]) 05/05/14   hospitalization [facility]  . Short-term memory loss 04/2014  . Stroke [facility]) 04/2014     Past Medical History:  Diagnosis Date  . Abnormal ultrasound of carotid artery 04/2014   no significant obstruction  . Ataxia 04/2014  . Diabetes ([place])   . EKG abnormalities 04/2014   poor R wave progression, NSR  . Former smoker    25 years x 1.5 ppd, stopped 04/2014  . Gait disturbance 04/2014   s/p stroke  . H/O echocardiogram 04/2014   moderate LVH, 60-65% EF, no valve disease  . History of heart  attack   . Hypertensive heart disease 04/2014  . Intraparenchymal hemorrhage of brain [facility]) 05/05/14   hospitalization [facility]  . Short-term memory loss 04/2014  . Stroke [facility]) 04/2014   Current Outpatient Prescriptions on File Prior to Visit  Medication Sig Dispense Refill  . amLODipine (NORVASC) 10 MG tablet Take 1 tablet (10 mg total) by mouth daily. 90 tablet 3  . blood glucose meter kit and supplies KIT Dispense based on patient and insurance preference. Use up to four times daily as directed. (FOR ICD-9 250.00, 250.01). 1 each 0  . hydrALAZINE (APRESOLINE) 100 MG tablet take 1 tablet by mouth three times a day (Patient taking differently: 1/2 tablet daily) 270 tablet 0  . labetalol (NORMODYNE) 200 MG tablet Take 1 tablet (200 mg total) by mouth 2 (two) times daily. 180 tablet 3  . losartan (COZAAR) 100 MG tablet Take 1 tablet (100 mg total) by mouth daily. 90 tablet 3  . sildenafil (VIAGRA) 100 MG tablet Take 1 tablet (100 mg total) by mouth daily as needed for erectile dysfunction. 15 tablet 11  . spironolactone (ALDACTONE) 50 MG tablet Take 1 tablet (50 mg total) by mouth daily. 90 tablet 3   No current facility-administered medications on file prior to visit.    ROS as in subjective  Objective: BP 130/80   Pulse 68   Resp 16   Ht 6' 6.75" (2 m)   Wt (!) 333 lb 3.2 oz (151.1 kg)   BMI 37.78 kg/m   Wt Readings from Last 3 Encounters:  11/24/15 (!) 333 lb 3.2 oz (151.1 kg)  11/21/15 (!) 331 lb 12.8 oz (150.5 kg)  08/09/15 (!) 328 lb (148.8 kg)   BP Readings from Last 3 Encounters:  11/24/15 130/80  11/21/15 100/70  08/09/15 124/72    General appearance: alert, no distress, WD/WN Speech seems normal today compared to stuttering and speech impairment right after stroke in the past HEENT: normocephalic, sclerae anicteric, PERRLA, EOMi, nares patent, no discharge or erythema, pharynx normal Oral cavity: MMM, no lesions Neck: supple, no lymphadenopathy,  no thyromegaly, no masses Heart: RRR, normal S1, S2, no murmurs Lungs: CTA bilaterally, no wheezes, rhonchi, or rales Extremities: no edema, no cyanosis, no clubbing Pulses: 2+ symmetric, upper and lower extremities, normal cap refill Neurological: alert, oriented x 3, CN2-12 intact, strength normal upper extremities and lower extremities, sensation normal throughout, DTRs 2+ throughout, no cerebellar signs, gait normal  Psychiatric: normal affect, behavior normal, pleasant     Assessment: Encounter Diagnoses  Name Primary?  . Hypertensive heart disease without heart failure Yes  . Uncontrolled type 2 diabetes mellitus with complication, without long-term current use of insulin ([place])   . OSA (obstructive sleep apnea)   . Morbid obesity ([place])   . History of stroke   . Low HDL (under 40)   . Influenza vaccination declined   . Elevated serum creatinine      Plan: Reviewed his 11/21/15 cardiology notes with Dr. Acie Fredrickson.  BP has been well controlled and recent Hydralazine was decreased and they plan to stop this.    Glad to see he is exercising, begin careful with diet and motivated to stay healthy.   C/t exercise daily, healthy diet.   Last HgbA1C from 03/01/15 was 10.3% Lipid from 11/21/15 reviewed.  CMET with elevated creatinine of 1.41.  normal micro albumin reviewed from 03/01/15.    C/t routine f/u with cardiology, current medications  Diabetes - amazingly his HgbA1C was 6.2% today.  Congratulated him on his efforts with diet, exercise.   C/t healthy diet, exercise.  If desired can consider victoza to help with weight loss and control sugars.   C/t daily foot checks, yearly eye doctor visits  Hx/o stroke - doing well of late, exercising, working.  Advised statin and Aspirin '81mg'$  QHS.  He will consider and let me know for secondary prevention.  OSA - compliant with CPAP  He declines flu shot today

## 2016-02-13 ENCOUNTER — Other Ambulatory Visit: Payer: 59

## 2016-02-21 ENCOUNTER — Ambulatory Visit (INDEPENDENT_AMBULATORY_CARE_PROVIDER_SITE_OTHER): Payer: 59 | Admitting: Cardiovascular Disease

## 2016-02-21 ENCOUNTER — Encounter: Payer: Self-pay | Admitting: Cardiovascular Disease

## 2016-02-21 VITALS — BP 126/90 | HR 61 | Ht >= 80 in | Wt 329.4 lb

## 2016-02-21 DIAGNOSIS — I119 Hypertensive heart disease without heart failure: Secondary | ICD-10-CM

## 2016-02-21 DIAGNOSIS — Z13220 Encounter for screening for lipoid disorders: Secondary | ICD-10-CM

## 2016-02-21 MED ORDER — SPIRONOLACTONE 50 MG PO TABS: 50.0000 mg | ORAL_TABLET | Freq: Every day | ORAL | 3 refills | Status: DC

## 2016-02-21 MED ORDER — LABETALOL HCL 200 MG PO TABS: 200.0000 mg | ORAL_TABLET | Freq: Two times a day (BID) | ORAL | 3 refills | Status: DC

## 2016-02-21 MED ORDER — SILDENAFIL CITRATE 100 MG PO TABS: 100.0000 mg | ORAL_TABLET | Freq: Every day | ORAL | 11 refills | Status: DC | PRN

## 2016-02-21 MED ORDER — AMLODIPINE BESYLATE 10 MG PO TABS: 10.0000 mg | ORAL_TABLET | Freq: Every day | ORAL | 3 refills | Status: DC

## 2016-02-21 MED ORDER — LOSARTAN POTASSIUM 100 MG PO TABS: 100.0000 mg | ORAL_TABLET | Freq: Every day | ORAL | 3 refills | Status: DC

## 2016-02-21 NOTE — Progress Notes (Signed)
Cardiology Office Note   Date:  02/21/2016   ID:  Brad [name], DOB 07/16/1960, MRN [id number]  PCP:  Brad Oxford, PA-C  Cardiologist:   Mertie Moores, MD   Chief Complaint  Patient presents with  . Hypertension   Problem list: 1. 1. Malignant hypertension - Echo from the past  shows normal left ventricular  systolic function with EF of 60-65% with moderate LVH 2. Intracranial hemorrhage caused by malignant hypertension 3. Non-ST segment elevation myocardial infarction-in the setting of malignant hypertension,  Echo shows normal LF function with EF of 60-65% with no segmental wall abn.     Brad [name] is a 56 y.o. male who presents for follow up for malignant hypertension and a type 2 non-ST segment myocardial infarction.  He's been doing fairly well from a cardiac point. He's not had any episodes of chest discomfort. He's currently in rehabilitation for intracranial hemorrhage. His blood pressure is much better controlled.  He has lost quite a bit of weight.   Oct. 26, 2016:  No Cp or dyspnea.  BP is fairly well controlled.   May 23, 2015:  Brad [name] is doing well.  BP is well controlled. Is recovering from his CVA  Walking    OCt. 23, 2017:   No CP or dyspnea.  Echo  Form [place], May 06, 2014 - Normal left ventricle systolic function - ejection fraction 60-65%. He has concentric left ventricular hypertrophy.  Works out every day   Jan. 23, 2018:  Feeling well.   Works out every day . Goes to the gym every day  Weight today is 329.   Past Medical History:  Diagnosis Date  . Abnormal ultrasound of carotid artery 04/2014   no significant obstruction  . Ataxia 04/2014  . Diabetes ([place])   . EKG abnormalities 04/2014   poor R wave progression, NSR  . Former smoker    25 years x 1.5 ppd, stopped 04/2014  . Gait disturbance 04/2014   s/p stroke  . H/O echocardiogram 04/2014   moderate LVH, 60-65% EF, no valve disease  . History of heart attack   .  Hypertensive heart disease 04/2014  . Intraparenchymal hemorrhage of brain [facility]) 05/05/14   hospitalization [facility]  . Short-term memory loss 04/2014  . Stroke [facility]) 04/2014    Past Surgical History:  Procedure Laterality Date  . APPENDECTOMY    . HEMORRHOID SURGERY       Current Outpatient Prescriptions  Medication Sig Dispense Refill  . amLODipine (NORVASC) 10 MG tablet Take 1 tablet (10 mg total) by mouth daily. 90 tablet 3  . blood glucose meter kit and supplies KIT Dispense based on patient and insurance preference. Use up to four times daily as directed. (FOR ICD-9 250.00, 250.01). 1 each 0  . hydrALAZINE (APRESOLINE) 100 MG tablet take 1 tablet by mouth three times a day (Patient taking differently: 1/2 tablet daily) 270 tablet 0  . labetalol (NORMODYNE) 200 MG tablet Take 1 tablet (200 mg total) by mouth 2 (two) times daily. 180 tablet 3  . losartan (COZAAR) 100 MG tablet Take 1 tablet (100 mg total) by mouth daily. 90 tablet 3  . sildenafil (VIAGRA) 100 MG tablet Take 1 tablet (100 mg total) by mouth daily as needed for erectile dysfunction. 15 tablet 11  . spironolactone (ALDACTONE) 50 MG tablet Take 1 tablet (50 mg total) by mouth daily. 90 tablet 3   No current facility-administered medications for this visit.  Allergies:   Patient has no known allergies.    Social History:  The patient  reports that he quit smoking about 21 months ago. He has a 37.50 pack-year smoking history. He has never used smokeless tobacco. He reports that he does not drink alcohol or use drugs.   Family History:  The patient's family history includes Aneurysm in his paternal grandmother; Heart disease in his mother; Hypertension in his brother and mother.    ROS:  Please see the history of present illness.    Review of Systems: Constitutional:  denies fever, chills, diaphoresis,   and fatigue. Appetite has not been normal   HEENT: denies photophobia, eye pain, redness,  hearing loss, ear pain, congestion, sore throat, rhinorrhea, sneezing, neck pain, neck stiffness and tinnitus.  Respiratory: denies SOB, DOE, cough, chest tightness, and wheezing.  Cardiovascular: denies chest pain, palpitations and leg swelling.  Gastrointestinal: denies nausea, vomiting, abdominal pain, diarrhea, constipation, blood in stool.  Genitourinary: denies dysuria, urgency, frequency, hematuria, flank pain and difficulty urinating.  Musculoskeletal: denies  myalgias, back pain, joint swelling, arthralgias and gait problem.   Skin: denies pallor, rash and wound.  Neurological: denies dizziness, seizures, syncope, weakness, light-headedness, numbness and headaches.   Hematological: denies adenopathy, easy bruising, personal or family bleeding history.  Psychiatric/ Behavioral: denies suicidal ideation, mood changes, confusion, nervousness, sleep disturbance and agitation.       All other systems are reviewed and negative.    PHYSICAL EXAM: VS:  BP 126/90   Pulse 61   Ht '6\' 9"'$  (2.057 m)   Wt (!) 329 lb 6.4 oz (149.4 kg)   SpO2 96%   BMI 35.30 kg/m  , BMI Body mass index is 35.3 kg/m. GEN: Well nourished, well developed, in no acute distress  HEENT: normal  Neck: no JVD, carotid bruits, or masses Cardiac: RRR; no murmurs, rubs, or gallops,no edema  Respiratory:  clear to auscultation bilaterally, normal work of breathing GI: soft, nontender, nondistended, + BS MS: no deformity or atrophy  Skin: warm and dry, no rash Neuro:  Strength and sensation are intact Psych: normal   EKG:  EKG is ordered today. The ekg ordered today demonstrates   NSR at 68.  Normal    Recent Labs: 03/01/2015: Hemoglobin 12.7; Platelets 303 11/21/2015: ALT 25; BUN 19; Creat 1.41; Potassium 4.5; Sodium 139    Lipid Panel    Component Value Date/Time   CHOL 109 (L) 11/21/2015 1124   CHOL 84 (L) 10/01/2014 0001   TRIG 46 11/21/2015 1124   TRIG 51 10/01/2014 0001   HDL 42 11/21/2015 1124     HDL 40 10/01/2014 0001   CHOLHDL 2.6 11/21/2015 1124   VLDL 9 11/21/2015 1124   LDLCALC 58 11/21/2015 1124   LDLCALC 34 10/01/2014 0001      Wt Readings from Last 3 Encounters:  02/21/16 (!) 329 lb 6.4 oz (149.4 kg)  11/24/15 (!) 333 lb 3.2 oz (151.1 kg)  11/21/15 (!) 331 lb 12.8 oz (150.5 kg)      Other studies Reviewed: Additional studies/ records that were reviewed today include: . Review of the above records demonstrates:    ASSESSMENT AND PLAN:  1.  Malignant hypertension -  Echocardiogram shows normal left ventricular systolic function with EF of 60-65% with moderate LVH. He is doing much better. Exercising some.   BP is well controlled. BMP today    2. Intracranial hemorrhage caused by malignant hypertension  3. Non-ST segment elevation myocardial infarction-in the setting of  malignant hypertension,  Echo shows normal LF function with EF of 60-65% with no segmental wall abn.  Lipids are very low.    At this point, I dont think he needs the atorvastatin .   His NSTEMI was caused by a hypertensive urgency .   He did not have stenting.    recheck his lipids today     Current medicines are reviewed at length with the patient today.  The patient does not have concerns regarding medicines.  The following changes have been made:  no change  Labs/ tests ordered today include:  No orders of the defined types were placed in this encounter.    Disposition:   FU with me in 6  months .     Mertie Moores, MD  02/21/2016 10:07 AM    [facility] [address], [place], [place]  [address] Phone: [phone]; Fax: [phone]

## 2016-02-21 NOTE — Patient Instructions (Addendum)
Medication Instructions:  Your physician recommends that you continue on your current medications as directed. Please refer to the Current Medication list given to you today.   Labwork: TODAY - basic metabolic panel  Your physician recommends that you return for lab work in: 6 months on the day of or a few days before your office visit with Dr. [name].  You will need to FAST for this appointment - nothing to eat or drink after midnight the night before except water.   Testing/Procedures: None Ordered   Follow-Up: Your physician wants you to follow-up in: 6 months with Dr. [name].  You will receive a reminder letter in the mail two months in advance. If you don't receive a letter, please call our office to schedule the follow-up appointment.   If you need a refill on your cardiac medications before your next appointment, please call your pharmacy.   Thank you for choosing [facility]! Michelle Swinyer, RN [phone]    

## 2016-02-22 LAB — BASIC METABOLIC PANEL
BUN / CREAT RATIO: 13 (ref 9–20)
BUN: 14 mg/dL (ref 6–24)
CALCIUM: 9.1 mg/dL (ref 8.7–10.2)
CO2: 22 mmol/L (ref 18–29)
Chloride: 105 mmol/L (ref 96–106)
Creatinine, Ser: 1.11 mg/dL (ref 0.76–1.27)
GFR calc non Af Amer: 75 mL/min/{1.73_m2} (ref >59)
GFR, EST AFRICAN AMERICAN: 87 mL/min/{1.73_m2} (ref >59)
Glucose: 132 mg/dL — ABNORMAL HIGH (ref 65–99)
Potassium: 4.3 mmol/L (ref 3.5–5.2)
Sodium: 141 mmol/L (ref 134–144)

## 2016-08-06 ENCOUNTER — Encounter: Payer: Self-pay | Admitting: Neurology

## 2016-08-08 ENCOUNTER — Encounter: Payer: Self-pay | Admitting: Neurology

## 2016-08-08 ENCOUNTER — Ambulatory Visit (INDEPENDENT_AMBULATORY_CARE_PROVIDER_SITE_OTHER): Payer: 59 | Admitting: Neurology

## 2016-08-08 VITALS — BP 132/83 | HR 57 | Ht >= 80 in | Wt 329.0 lb

## 2016-08-08 DIAGNOSIS — Z9989 Dependence on other enabling machines and devices: Secondary | ICD-10-CM | POA: Diagnosis not present

## 2016-08-08 DIAGNOSIS — G4733 Obstructive sleep apnea (adult) (pediatric): Secondary | ICD-10-CM

## 2016-08-08 NOTE — Progress Notes (Signed)
Pt asked me to [facility] his [facility] to [facility] during his office visit today. I called him and let him know that this was taken care of this afternoon.

## 2016-08-08 NOTE — Patient Instructions (Addendum)
Please continue using your CPAP regularly. While your insurance requires that you use CPAP at least 4 hours each night on 70% of the nights, I recommend, that you not skip any nights and use it throughout the night if you can. Getting used to CPAP and staying with the treatment long term does take time and patience and discipline. Untreated obstructive sleep apnea when it is moderate to severe can have an adverse impact on cardiovascular health and raise her risk for heart disease, arrhythmias, hypertension, congestive heart failure, stroke and diabetes. Untreated obstructive sleep apnea causes sleep disruption, nonrestorative sleep, and sleep deprivation. This can have an impact on your day to day functioning and cause daytime sleepiness and impairment of cognitive function, memory loss, mood disturbance, and problems focussing. Using CPAP regularly can improve these symptoms.  Keep up the good work! I will see you back in 12 months for sleep apnea.    

## 2016-08-08 NOTE — Progress Notes (Signed)
Subjective:    Patient ID: Brad [name] is a 56 y.o. male.  HPI     Interim history:   Brad [name] is a 56-year-old right-handed gentleman with an underlying medical history of hypertension, prior lacunar strokes, recent hemorrhagic stroke in April 2016, recent smoking cessation, and obesity, who presents for follow up consultation of his mild to moderate obstructive sleep apnea, on CPAP therapy at home. The patient is unaccompanied today. I last saw him on 08/09/2015, at which time he reported doing okay, was trying to lose weight. He had a checkup with his cardiologist in April 2017.  Today, 08/08/2016: I reviewed his CPAP compliance data from 07/08/2016 through 08/06/2016, which is a total of 30 days, during which time he used his CPAP every night with percent used days greater than 4 hours at 90%, indicating excellent compliance with an average usage of 5 hours and 3 minutes, residual AHI low at 0.5 per hour, leak acceptable with the 95th percentile at 14.6 L/m on a pressure of 7 cm with EPR of 1. He reports doing well but needs updated supplies. He's having some difficulty with his DME company and getting supplies on a regular basis as I understand. We have contacted his DME company, [employer] today. He has had no recent illness or medication changes with the exception that he no longer takes hydralazine. He is exercising on a regular basis, trying to lose weight, has maintained compared to last year. He has no new complaints.   The patient's allergies, current medications, family history, past medical history, past social history, past surgical history and problem list were reviewed and updated as appropriate.    Previously:   I saw him 02/09/2015, at which time he reported doing fairly well. He was tolerating CPAP and he was compliant with treatment. He indicated that he would wake up rested and daytime energy was better. He was working on weight loss and exercising regularly. We talked about  is his baseline sleep study results from August 2016 and his CPAP titration study results from September 2016 in detail at the time. He was encouraged to continue with CPAP therapy regularly.   I reviewed his CPAP compliance data from 07/09/2015 through 08/07/2015 which is a total of 30 days during which time he used his machine 24 days with percent used days greater than 4 hours at 60%, indicating suboptimal compliance with an average usage for all nights of 3 hours and 3 minutes, residual AHI 1.1 per hour, leak acceptable with the 95th percentile at 15.6 L/m on a pressure of 7 cm with EPR of 1.   I first met him on 07/20/2014 at the request of Dr. Ahern, at which time the patient reported snoring, multiple nighttime awakenings, nocturia and excessive daytime somnolence. I invited him back for sleep study. He had a baseline sleep study, followed by a CPAP titration study. I went over his test results with him in detail today. His baseline sleep study from 09/03/2014 showed a sleep efficiency of 63.9% with a latency to sleep of 81.5 minutes, and wake after sleep onset of 78 minutes with mild to moderate sleep fragmentation noted. He had an increased percentage of stage II sleep, absence of slow-wave sleep and REM sleep at 26.1% with a REM latency of 56 minutes. He had no significant PLMS, EKG or EEG changes. He had mild to moderate snoring. He did not have any supine sleep. Total AHI was 10 per hour, rising to 20.4 per hour during REM   sleep. Average oxygen saturation was 96%, nadir was 90%. Given his sleep related complaints and his history of stroke, I invited him back for a full night CPAP titration study. He had this on 10/08/2014: Sleep efficiency was 73.6% with a latency of sleep of 13.5 minutes and wake after sleep onset of 87 minutes with mild sleep fragmentation noted. He had an increased percentage of stage 2 sleep, absence of slow wave sleep and increased REM sleep at 34.8% with a mildly reduced REM  latency of 51.5 minutes. He had no significant PLMS, EKG or EEG changes. Average oxygen saturation was 94%, nadir was 90%. CPAP was titrated from 5 cm to 8 cm. AHI was 0 per hour on a pressure of 7 cm. Based on his test results I prescribed CPAP therapy for home use.   I reviewed his CPAP compliance data from 01/09/2015 through 02/07/2015 which is a total of 30 days during which time he used his machine 27 days with percent used days greater than 4 hours at 80%, indicating very good compliance with an average usage of 4 hours and 52 minutes, residual AHI low at 0.7 per hour, leak at times high with the 95th percentile at 28.7 L/m on a pressure of 7 cm with EPR of 1.   07/20/2014: He reports snoring, has multiple nighttime awakenings, has nocturia and excessive daytime somnolence. His ESS is 20/24. His snoring can be loud according to his wife, who provides most of his history. His bedtime varies. He may be in bed around 10 PM but typically does not fall asleep until late at night. He watches TV in bed. He may fall asleep around 2 AM. He goes to the bathroom multiple times each night, an average of 5 times per night. He denies morning headaches. He's currently not working. He works in manufacturing at [employer]. He works 12 hour shifts, from 7 AM to 7 PM. He works in [place]. He lives in [place]. He denies restless leg symptoms. He's not known to twitch in his sleep according to his wife. He denies peripheral edema. He does not drink caffeine every day. He denies alcohol consumption. He quit smoking in April 2016. His rise time varies. He may be up by 5 AM.  His Past Medical History Is Significant For: Past Medical History:  Diagnosis Date  . Abnormal ultrasound of carotid artery 04/2014   no significant obstruction  . Ataxia 04/2014  . Diabetes (HCC)   . EKG abnormalities 04/2014   poor R wave progression, NSR  . Former smoker    25 years x 1.5 ppd, stopped 04/2014  . Gait disturbance 04/2014   s/p  stroke  . H/O echocardiogram 04/2014   moderate LVH, 60-65% EF, no valve disease  . History of heart attack   . Hypertensive heart disease 04/2014  . Intraparenchymal hemorrhage of brain (HCC) 05/05/14   hospitalization [facility]  . Short-term memory loss 04/2014  . Stroke (HCC) 04/2014    His Past Surgical History Is Significant For: Past Surgical History:  Procedure Laterality Date  . APPENDECTOMY    . HEMORRHOID SURGERY      His Family History Is Significant For: Family History  Problem Relation Age of Onset  . Heart disease Mother   . Hypertension Mother   . Hypertension Brother   . Aneurysm Paternal Grandmother     His Social History Is Significant For: Social History   Social History  . Marital status: Married    Spouse   name: Crystal Pica  . Number of children: 2  . Years of education: 12   Social History Main Topics  . Smoking status: Former Smoker    Packs/day: 1.50    Years: 25.00    Quit date: 05/05/2014  . Smokeless tobacco: Never Used  . Alcohol use No  . Drug use: No  . Sexual activity: Not Asked   Other Topics Concern  . None   Social History Narrative   Lives at home with wife   Married to Crystal Schlagel, non denominational, works at [employer] in [place], [place].  From [place], but moved to [place] 2016.     Caffeine use: Drinks tea occass    His Allergies Are:  No Known Allergies:   His Current Medications Are:  Outpatient Encounter Prescriptions as of 08/08/2016  Medication Sig  . amLODipine (NORVASC) 10 MG tablet Take 1 tablet (10 mg total) by mouth daily.  . blood glucose meter kit and supplies KIT Dispense based on patient and insurance preference. Use up to four times daily as directed. (FOR ICD-9 250.00, 250.01).  . labetalol (NORMODYNE) 200 MG tablet Take 1 tablet (200 mg total) by mouth 2 (two) times daily.  . losartan (COZAAR) 100 MG tablet Take 1 tablet (100 mg total) by mouth daily.  . sildenafil (VIAGRA) 100 MG  tablet Take 1 tablet (100 mg total) by mouth daily as needed for erectile dysfunction.  . spironolactone (ALDACTONE) 50 MG tablet Take 1 tablet (50 mg total) by mouth daily.  . [DISCONTINUED] hydrALAZINE (APRESOLINE) 100 MG tablet take 1 tablet by mouth three times a day (Patient taking differently: 1/2 tablet daily)   No facility-administered encounter medications on file as of 08/08/2016.   :  Review of Systems:  Out of a complete 14 point review of systems, all are reviewed and negative with the exception of these symptoms as listed below:   Review of Systems  Neurological:       Pt presents today to discuss his cpap. Pt says that he needs new supplies from [employer]. He has tried to increase his exercise and feels much better. Pt is also trying to lose some weight.    Objective:  Neurological Exam  Physical Exam Physical Examination:   Vitals:   08/08/16 1017  BP: 132/83  Pulse: (!) 57   General Examination: The patient is a very pleasant 56 y.o. male in no acute distress. He appears well-developed and well-nourished and adequately groomed. Good spirits.   HEENT: Normocephalic, atraumatic, pupils are equal, round and reactive to light and accommodation. Extraocular tracking is good without limitation to gaze excursion or nystagmus noted. Normal smooth pursuit is noted. Hearing is grossly intact. Face is symmetric with normal facial animation and normal facial sensation. Speech is clear, no hypophonia. There is no lip, neck/head, jaw or voice tremor. Oropharynx exam reveals: stable findings, marked airway crowding. Tongue protrudes centrally and palate elevates symmetrically.   Chest: Clear to auscultation without wheezing, rhonchi or crackles noted.  Heart: S1+S2+0, regular and normal without murmurs, rubs or gallops noted.   Abdomen: Soft, non-tender and non-distended with normal bowel sounds appreciated on auscultation.  Extremities: There are no new findings.   Skin:  Warm and dry without trophic changes noted. There are no varicose veins.  Musculoskeletal: exam reveals no obvious joint deformities, tenderness or joint swelling or erythema.   Neurologically:  Mental status: The patient is awake, alert and oriented in all 4 spheres. His immediate and remote memory, attention,   language skills and fund of knowledge are appropriate. There is no evidence of aphasia, agnosia, apraxia or anomia. Speech is clear with normal prosody and enunciation. Thought process is linear. Mood and affect are normal.  Cranial nerves II - XII are as described above under HEENT exam. In addition: shoulder shrug is normal with equal shoulder height noted. Motor exam: Normal bulk, strength and tone is noted. There is no tremor or rebound. Romberg is negative. Fine motor skills are grossly intact.  Sensory exam: intact to light touch.  Gait, station and balance: He stands without difficulty. Posture is age-appropriate and stance is narrow based. Gait shows normal stride length and normal pace. No problems turning are noted. Tandem walk is challenging for him, stable.                 Assessment and Plan:   In summary, Brad [name] is a very pleasant 56 year old male with an underlying medical history of difficult to control hypertension, on multiple antihypertensives, hyperlipidemia, lacunar strokes, hemorrhagic stroke in April 2016, and obesity, who presents for follow-up consultation of his obstructive sleep apnea, well established on CPAP therapy at 7 cm. He had a baseline sleep study in August 2016, followed by a CPAP titration study in September 2016. We have previously discussed his test results in detail and today reviewed his most recent compliance data together. While his overall compliance is excellent, he is advised that his average usage time is barely over 5 hours and he is reminded to allow for enough sleep time of 7-8 hours ideally. He has indicated good results with CPAP  therapy, need supplies which I will order today. He is encouraged to continue to work on weight loss and has maintained in the past year. He is working on regular exercise and improved lifestyle. He is commended for this. I suggested a one-year checkup, sooner as needed. I answered all his questions today and he was in agreement.  I spent 20 minutes in total face-to-face time with the patient, more than 50% of which was spent in counseling and coordination of care, reviewing test results, reviewing medication and discussing or reviewing the diagnosis of OSA, its prognosis and treatment options. Pertinent laboratory and imaging test results that were available during this visit with the patient were reviewed by me and considered in my medical decision making (see chart for details).

## 2016-08-15 DIAGNOSIS — G4733 Obstructive sleep apnea (adult) (pediatric): Secondary | ICD-10-CM | POA: Diagnosis not present

## 2016-08-28 ENCOUNTER — Other Ambulatory Visit: Payer: Self-pay | Admitting: Cardiovascular Disease

## 2016-08-28 MED ORDER — SPIRONOLACTONE 50 MG PO TABS: 50.0000 mg | ORAL_TABLET | Freq: Every day | ORAL | 1 refills | Status: DC

## 2016-10-09 ENCOUNTER — Encounter: Payer: Self-pay | Admitting: Family Medicine

## 2016-10-09 ENCOUNTER — Ambulatory Visit (INDEPENDENT_AMBULATORY_CARE_PROVIDER_SITE_OTHER): Payer: 59 | Admitting: Family Medicine

## 2016-10-09 VITALS — BP 124/88 | HR 64 | Temp 98.0°F | Resp 16 | Ht >= 80 in | Wt 334.2 lb

## 2016-10-09 DIAGNOSIS — Z125 Encounter for screening for malignant neoplasm of prostate: Secondary | ICD-10-CM | POA: Diagnosis not present

## 2016-10-09 DIAGNOSIS — Z0000 Encounter for general adult medical examination without abnormal findings: Secondary | ICD-10-CM

## 2016-10-09 DIAGNOSIS — I119 Hypertensive heart disease without heart failure: Secondary | ICD-10-CM | POA: Diagnosis not present

## 2016-10-09 DIAGNOSIS — R7303 Prediabetes: Secondary | ICD-10-CM | POA: Diagnosis not present

## 2016-10-09 DIAGNOSIS — Z6835 Body mass index (BMI) 35.0-35.9, adult: Secondary | ICD-10-CM

## 2016-10-09 DIAGNOSIS — G4733 Obstructive sleep apnea (adult) (pediatric): Secondary | ICD-10-CM

## 2016-10-09 NOTE — Progress Notes (Signed)
Subjective:    Patient ID: Brad [name], male    DOB: Oct 27, 1960, 56 y.o.   MRN: [id number]  Patient presents for New Patient (Initial Visit) Patient here to establish care. Records in Epic for reviewed. His medications reviewed. He has significant history of malignant hypertension which led to intracranial hemorrhage as well as in STEMI. He has been followed by cardiology since 2016 when this occurred. He has been taking his blood pressure medicines as prescribed. Has an appointment on Thursday with cardiology. He is due for fasting labs. His records also show borderline diabetes as well as obstructive sleep apnea and mild anemia. He states that he is trying to get off his medications he's been exercising at the gym least 5 days a week. He has to retire from his previous job at [employer] and he works now as a Education officer, environmentalpastor. He would like to try weight loss medication to assist with his appetite. His wife is currently on phentermine    Previous PCP Dr. Aleen Campiysinger  Cardiology- Dr. Elease HashimotoNahser  Sleep Medicine- Dr. Frances FurbishAthar For obstructive sleep apnea he is on CPAP therapy   BOrderlind diabetes - last A1C 6.2  Colonoscopy- Due   Immunizations- TDAP / declines flu shot   ? Mild anemia   Due for lipid panel   History reviewed   Review Of Systems:  GEN- denies fatigue, fever, weight loss,weakness, recent illness HEENT- denies eye drainage, change in vision, nasal discharge, CVS- denies chest pain, palpitations RESP- denies SOB, cough, wheeze ABD- denies N/V, change in stools, abd pain GU- denies dysuria, hematuria, dribbling, incontinence MSK- denies joint pain, muscle aches, injury Neuro- denies headache, dizziness, syncope, seizure activity       Objective:    BP 124/88   Pulse 64   Temp 98 F (36.7 C) (Oral)   Resp 16   Ht 6\' 9"  (2.057 m)   Wt (!) 334 lb 3.2 oz (151.6 kg)   SpO2 98%   BMI 35.81 kg/m  GEN- NAD, alert and oriented x3 HEENT- PERRL, EOMI, non injected sclera, pink  conjunctiva, MMM, oropharynx clear Neck- Supple, no thyromegaly, no bruit  CVS- RRR, no murmur RESP-CTAB ABD-NABS,soft,NT,ND EXT- No edema Pulses- Radial, DP- 2+        Assessment & Plan:      Problem List Items Addressed This Visit      Unprioritized   OSA (obstructive sleep apnea)   Obesity   Hypertensive heart disease without heart failure    Blood pressure is fairly controlled his diastolic was a little elevated but he is already on 4 blood pressure medications. He will follow with his cardiologist this week. I am to check his fasting labs today.      Relevant Orders   Lipid panel   Borderline diabetes    Recheck A1c discussed dietary changes she can make a cut of his carbs and sugars his weight and his weight loss. He is doing well with the exercise routine otherwise. Regards to the obesity and see what his labs look like P we may be able to try something like the phentermine he does not have any history of any cardiac arrhythmia or heart failure. We will have to check his blood pressure closely supported use this. Other possibility is the use of contrave or Saxenda       Relevant Orders   Hemoglobin A1c   Lipid panel    Other Visit Diagnoses    Routine general medical examination at a health care facility    -  Primary   CPE , declines flu, declines colonoscopy or cologuard, PSA discussed and ordered, declines HIV/Hep C testing   Relevant Orders   CBC with Differential/Platelet   Comprehensive metabolic panel   Prostate cancer screening       Relevant Orders   PSA      Note: This dictation was prepared with Dragon dictation along with smaller phrase technology. Any transcriptional errors that result from this process are unintentional.

## 2016-10-09 NOTE — Assessment & Plan Note (Signed)
Recheck A1c discussed dietary changes she can make a cut of his carbs and sugars his weight and his weight loss. He is doing well with the exercise routine otherwise. Regards to the obesity and see what his labs look like P we may be able to try something like the phentermine he does not have any history of any cardiac arrhythmia or heart failure. We will have to check his blood pressure closely supported use this. Other possibility is the use of contrave or Saxenda

## 2016-10-09 NOTE — Patient Instructions (Signed)
I recommend eye visit once a year I recommend dental visit every 6 months Goal is to  Exercise 30 minutes 5 days a week We will call with  with lab results  F/u Pending results

## 2016-10-09 NOTE — Assessment & Plan Note (Signed)
Blood pressure is fairly controlled his diastolic was a little elevated but he is already on 4 blood pressure medications. He will follow with his cardiologist this week. I am to check his fasting labs today.

## 2016-10-10 ENCOUNTER — Telehealth: Payer: Self-pay

## 2016-10-10 DIAGNOSIS — R7303 Prediabetes: Secondary | ICD-10-CM

## 2016-10-10 DIAGNOSIS — E119 Type 2 diabetes mellitus without complications: Secondary | ICD-10-CM

## 2016-10-10 LAB — LIPID PANEL
CHOLESTEROL: 112 mg/dL (ref <200)
HDL: 39 mg/dL — ABNORMAL LOW (ref >40)
LDL Cholesterol (Calc): 59 mg/dL (calc)
Non-HDL Cholesterol (Calc): 73 mg/dL (calc) (ref <130)
Total CHOL/HDL Ratio: 2.9 (calc) (ref <5.0)
Triglycerides: 48 mg/dL (ref <150)

## 2016-10-10 LAB — CBC WITH DIFFERENTIAL/PLATELET
Basophils Absolute: 31 cells/uL (ref 0–200)
Basophils Relative: 0.5 %
EOS ABS: 180 {cells}/uL (ref 15–500)
Eosinophils Relative: 2.9 %
HEMATOCRIT: 38 % — AB (ref 38.5–50.0)
Hemoglobin: 12.4 g/dL — ABNORMAL LOW (ref 13.2–17.1)
Lymphs Abs: 1786 cells/uL (ref 850–3900)
MCH: 28.3 pg (ref 27.0–33.0)
MCHC: 32.6 g/dL (ref 32.0–36.0)
MCV: 86.8 fL (ref 80.0–100.0)
MPV: 10.1 fL (ref 7.5–12.5)
Monocytes Relative: 8.2 %
Neutro Abs: 3695 cells/uL (ref 1500–7800)
Neutrophils Relative %: 59.6 %
Platelets: 283 10*3/uL (ref 140–400)
RBC: 4.38 10*6/uL (ref 4.20–5.80)
RDW: 12.7 % (ref 11.0–15.0)
Total Lymphocyte: 28.8 %
WBC: 6.2 10*3/uL (ref 3.8–10.8)
WBCMIX: 508 {cells}/uL (ref 200–950)

## 2016-10-10 LAB — HEMOGLOBIN A1C
Hgb A1c MFr Bld: 6.8 % of total Hgb — ABNORMAL HIGH (ref <5.7)
MEAN PLASMA GLUCOSE: 148 (calc)
eAG (mmol/L): 8.2 (calc)

## 2016-10-10 LAB — COMPREHENSIVE METABOLIC PANEL
AG Ratio: 1.8 (calc) (ref 1.0–2.5)
ALBUMIN MSPROF: 4.4 g/dL (ref 3.6–5.1)
ALKALINE PHOSPHATASE (APISO): 45 U/L (ref 40–115)
ALT: 21 U/L (ref 9–46)
AST: 16 U/L (ref 10–35)
BILIRUBIN TOTAL: 0.8 mg/dL (ref 0.2–1.2)
BUN: 18 mg/dL (ref 7–25)
CALCIUM: 9.8 mg/dL (ref 8.6–10.3)
CO2: 25 mmol/L (ref 20–32)
Chloride: 104 mmol/L (ref 98–110)
Creat: 1.23 mg/dL (ref 0.70–1.33)
Globulin: 2.4 g/dL (calc) (ref 1.9–3.7)
Glucose, Bld: 153 mg/dL — ABNORMAL HIGH (ref 65–99)
POTASSIUM: 4.4 mmol/L (ref 3.5–5.3)
Sodium: 138 mmol/L (ref 135–146)
Total Protein: 6.8 g/dL (ref 6.1–8.1)

## 2016-10-10 LAB — PSA: PSA: 0.5 ng/mL (ref <=4.0)

## 2016-10-10 NOTE — Telephone Encounter (Signed)
Since he has the diabetes, I think the best weight loss medication would be Saxenda He can come in and be shown how to use, okay to give sample as well This will control blood sugar and help lose weight  Start saxenda:  Week 1: 0.6mg  injected daily:   Week 2:  1.2 mg injected daily  Week 3:  1.8mg  injected daily  Week 4:  2.4mg  injected daily  Week 5:   injected daily

## 2016-10-10 NOTE — Telephone Encounter (Signed)
-----   Message from Salley ScarletKawanta F [place], MD sent at 10/10/2016 12:37 PM EDT ----- Call pt he has diabetes- A1C is 6.8%  Cholsterol normal, PSA normal  He has very mild anemia, green leafy veggies will help this, multivitamin with iron  For the diabetes, he needs glucometer. Check CBG once a day  Start Metformin 500mg  once a day at breakfast, this will help blood sugar and weight. See if he is interested in being referred to diabetic nutritionist. Get f/u visit in 4 weeks, bring in meter, lets see how he does with metformin, before I can add any other weight loss medication

## 2016-10-10 NOTE — Telephone Encounter (Signed)
Spoke with patient about lab results. Patient is not willing to take metformin, however he is willing to try another medication. Patient is willing to see a diabetic nutritionist as well will put the referral in   Pls advise

## 2016-10-11 ENCOUNTER — Other Ambulatory Visit: Payer: Self-pay

## 2016-10-11 ENCOUNTER — Ambulatory Visit (INDEPENDENT_AMBULATORY_CARE_PROVIDER_SITE_OTHER): Payer: 59 | Admitting: Cardiovascular Disease

## 2016-10-11 ENCOUNTER — Encounter: Payer: Self-pay | Admitting: Cardiovascular Disease

## 2016-10-11 VITALS — BP 122/82 | HR 57 | Ht >= 80 in | Wt 332.4 lb

## 2016-10-11 DIAGNOSIS — I10 Essential (primary) hypertension: Secondary | ICD-10-CM

## 2016-10-11 MED ORDER — ONETOUCH ULTRASOFT LANCETS MISC: 12 refills | Status: AC

## 2016-10-11 MED ORDER — BLOOD GLUCOSE MONITOR KIT: PACK | 0 refills | Status: AC

## 2016-10-11 MED ORDER — GLUCOSE BLOOD VI STRP: ORAL_STRIP | 12 refills | Status: DC

## 2016-10-11 NOTE — Patient Instructions (Signed)
Medication Instructions:  Your physician recommends that you continue on your current medications as directed. Please refer to the Current Medication list given to you today.   Labwork: None Ordered   Testing/Procedures: None Ordered   Follow-Up: Your physician wants you to follow-up in: 1 year with Dr. [name].  You will receive a reminder letter in the mail two months in advance. If you don't receive a letter, please call our office to schedule the follow-up appointment.   If you need a refill on your cardiac medications before your next appointment, please call your pharmacy.   Thank you for choosing [facility]!  , RN [phone]    

## 2016-10-11 NOTE — Progress Notes (Signed)
Cardiology Office Note   Date:  10/11/2016   ID:  Brad [name], DOB [birth year], MRN [id number]  PCP:  Alycia Rossetti, MD  Cardiologist:   Mertie Moores, MD   Chief Complaint  Patient presents with  . Follow-up    HTN   Problem list: 1. 1. Malignant hypertension - Echo from the past  shows normal left ventricular  systolic function with EF of 60-65% with moderate LVH 2. Intracranial hemorrhage caused by malignant hypertension 3. Non-ST segment elevation myocardial infarction-in the setting of malignant hypertension,  Echo shows normal LF function with EF of 60-65% with no segmental wall abn.     Brad [name] is a 56 y.o. male who presents for follow up for malignant hypertension and a type 2 non-ST segment myocardial infarction.  He's been doing fairly well from a cardiac point. He's not had any episodes of chest discomfort. He's currently in rehabilitation for intracranial hemorrhage. His blood pressure is much better controlled.  He has lost quite a bit of weight.   Oct. 26, 2016:  No Cp or dyspnea.  BP is fairly well controlled.   May 23, 2015:  Brad [name] is doing well.  BP is well controlled. Is recovering from his CVA  Walking    OCt. 23, 2017:   No CP or dyspnea.  Echo  Form [place], May 06, 2014 - Normal left ventricle systolic function - ejection fraction 60-65%. He has concentric left ventricular hypertrophy.  Works out every day   Jan. 23, 2018:  Feeling well.   Works out every day . Goes to the gym every day  Weight today is 329.  Sept. 13, 2108 Wt = 332 Brad [name] is seen today  BP is well controlled.  No CP or dyspnea  Had stopped the Aldactone,  Is back on it now Is ready to go on a diet - HbA1C is up  Labs from 9/11 look good  Past Medical History:  Diagnosis Date  . Abnormal ultrasound of carotid artery 04/2014   no significant obstruction  . Ataxia 04/2014  . Diabetes ([place])   . EKG abnormalities 04/2014   poor R wave progression, NSR  .  Former smoker    25 years x 1.5 ppd, stopped 04/2014  . Gait disturbance 04/2014   s/p stroke  . H/O echocardiogram 04/2014   moderate LVH, 60-65% EF, no valve disease  . History of heart attack   . Hypertensive heart disease 04/2014  . Intraparenchymal hemorrhage of brain [facility]) 05/05/14   hospitalization [facility]  . Short-term memory loss 04/2014  . Stroke [facility]) 04/2014    Past Surgical History:  Procedure Laterality Date  . APPENDECTOMY    . HEMORRHOID SURGERY       Current Outpatient Prescriptions  Medication Sig Dispense Refill  . amLODipine (NORVASC) 10 MG tablet Take 1 tablet (10 mg total) by mouth daily. 90 tablet 3  . blood glucose meter kit and supplies KIT Dispense based on patient and insurance preference. Use up to four times daily as directed. (FOR ICD-9 250.00, 250.01). 1 each 0  . labetalol (NORMODYNE) 200 MG tablet Take 1 tablet (200 mg total) by mouth 2 (two) times daily. 180 tablet 3  . losartan (COZAAR) 100 MG tablet Take 1 tablet (100 mg total) by mouth daily. 90 tablet 3  . sildenafil (VIAGRA) 100 MG tablet Take 1 tablet (100 mg total) by mouth daily as needed for erectile dysfunction. 15 tablet 11  . spironolactone (ALDACTONE)  50 MG tablet Take 1 tablet (50 mg total) by mouth daily. 90 tablet 1   No current facility-administered medications for this visit.     Allergies:   Patient has no known allergies.    Social History:  The patient  reports that he quit smoking about 2 years ago. He has a 37.50 pack-year smoking history. He has never used smokeless tobacco. He reports that he does not drink alcohol or use drugs.   Family History:  The patient's family history includes Aneurysm in his paternal grandmother; Heart disease in his mother; Hypertension in his brother and mother.    ROS:  Please see the history of present illness.    Review of Systems: Constitutional:  denies fever, chills, diaphoresis,   and fatigue. Appetite has not been  normal   HEENT: denies photophobia, eye pain, redness, hearing loss, ear pain, congestion, sore throat, rhinorrhea, sneezing, neck pain, neck stiffness and tinnitus.  Respiratory: denies SOB, DOE, cough, chest tightness, and wheezing.  Cardiovascular: denies chest pain, palpitations and leg swelling.  Gastrointestinal: denies nausea, vomiting, abdominal pain, diarrhea, constipation, blood in stool.  Genitourinary: denies dysuria, urgency, frequency, hematuria, flank pain and difficulty urinating.  Musculoskeletal: denies  myalgias, back pain, joint swelling, arthralgias and gait problem.   Skin: denies pallor, rash and wound.  Neurological: denies dizziness, seizures, syncope, weakness, light-headedness, numbness and headaches.   Hematological: denies adenopathy, easy bruising, personal or family bleeding history.  Psychiatric/ Behavioral: denies suicidal ideation, mood changes, confusion, nervousness, sleep disturbance and agitation.       All other systems are reviewed and negative.    PHYSICAL EXAM: VS:  BP 122/82   Pulse (!) 57   Ht 6' 8.5" (2.045 m)   Wt (!) 332 lb 6.4 oz (150.8 kg)   BMI 36.06 kg/m  , BMI Body mass index is 36.06 kg/m. GEN: Well nourished, well developed, in no acute distress  HEENT: normal  Neck: no JVD, carotid bruits, or masses Cardiac: RRR; no murmurs, rubs, or gallops,no edema  Respiratory:  clear to auscultation bilaterally, normal work of breathing GI: soft, nontender, nondistended, + BS MS: no deformity or atrophy  Skin: warm and dry, no rash Neuro:  Strength and sensation are intact Psych: normal   EKG:  EKG is ordered today. The ekg ordered today demonstrates   NSR at 68.  Normal    Recent Labs: 10/09/2016: ALT 21; BUN 18; Creat 1.23; Hemoglobin 12.4; Platelets 283; Potassium 4.4; Sodium 138    Lipid Panel    Component Value Date/Time   CHOL 112 10/09/2016 1125   CHOL 84 (L) 10/01/2014 0001   TRIG 48 10/09/2016 1125   TRIG 51  10/01/2014 0001   HDL 39 (L) 10/09/2016 1125   HDL 40 10/01/2014 0001   CHOLHDL 2.9 10/09/2016 1125   VLDL 9 11/21/2015 1124   LDLCALC 58 11/21/2015 1124   LDLCALC 34 10/01/2014 0001      Wt Readings from Last 3 Encounters:  10/11/16 (!) 332 lb 6.4 oz (150.8 kg)  10/09/16 (!) 334 lb 3.2 oz (151.6 kg)  08/08/16 (!) 329 lb (149.2 kg)      Other studies Reviewed: Additional studies/ records that were reviewed today include: . Review of the above records demonstrates:    ASSESSMENT AND PLAN:  1.  Malignant hypertension -  Echocardiogram shows normal left ventricular systolic function with EF of 60-65% with moderate LVH. He is doing much better. Exercising some.   BP is normal today .  He is taking his meds. Marland Kitchen BMP today  Ive stressed the importance of weight loss  2. Intracranial hemorrhage caused by malignant hypertension  3. Non-ST segment elevation myocardial infarction-in the setting of malignant hypertension,  Echo shows normal LF function with EF of 60-65% with no segmental wall abn.  Lipids are very low.    At this point, I dont think he needs the atorvastatin .   His NSTEMI was caused by a hypertensive urgency .   He did not have stenting.    recheck his lipids today     Current medicines are reviewed at length with the patient today.  The patient does not have concerns regarding medicines.  The following changes have been made:  no change  Labs/ tests ordered today include:  No orders of the defined types were placed in this encounter.    Disposition:   FU with me in 6  months .     Mertie Moores, MD  10/11/2016 10:10 AM    [address], [place], [place]  [address] Phone: [phone]; Fax: [phone]

## 2016-10-11 NOTE — Telephone Encounter (Signed)
Spoke with patient he is not willing to try the Saxenda. He states he would  like to see what he can do on his on at this point to get his numbers down Patient states he is trying to eliminate medications completely.

## 2016-10-12 NOTE — Telephone Encounter (Signed)
noted 

## 2016-10-15 ENCOUNTER — Other Ambulatory Visit: Payer: Self-pay | Admitting: *Deleted

## 2016-10-15 MED ORDER — BLOOD GLUCOSE SYSTEM PAK KIT: PACK | 1 refills | Status: DC

## 2016-11-07 ENCOUNTER — Encounter: Payer: Self-pay | Admitting: Family Medicine

## 2016-11-07 ENCOUNTER — Ambulatory Visit (INDEPENDENT_AMBULATORY_CARE_PROVIDER_SITE_OTHER): Payer: 59 | Admitting: Family Medicine

## 2016-11-07 VITALS — BP 110/78 | HR 60 | Temp 97.8°F | Resp 16 | Ht >= 80 in | Wt 320.0 lb

## 2016-11-07 DIAGNOSIS — E6609 Other obesity due to excess calories: Secondary | ICD-10-CM | POA: Diagnosis not present

## 2016-11-07 DIAGNOSIS — E119 Type 2 diabetes mellitus without complications: Secondary | ICD-10-CM | POA: Diagnosis not present

## 2016-11-07 DIAGNOSIS — Z6834 Body mass index (BMI) 34.0-34.9, adult: Secondary | ICD-10-CM

## 2016-11-07 DIAGNOSIS — I10 Essential (primary) hypertension: Secondary | ICD-10-CM

## 2016-11-07 NOTE — Patient Instructions (Signed)
F/U  3 months 

## 2016-11-07 NOTE — Progress Notes (Signed)
   Subjective:    Patient ID: Brad [name], male    DOB: Jul 12, 1960, 56 y.o.   MRN: [id number]  Patient presents for Follow-up  Pt here for f/u. Establish care at hte last visit  He has been working on weight loss to help correct his diabetes    DM- last A1C 6.8%, recommended MTF he declined, recommended Saxenda as he wanted weight loss medication He also declined. He was also referred to diabetic educator but has not went, instead  He has changed his diet, cut out junk food, cut fast good   weight down 12lbs , exercising 5 days a week   He was seen by cardiology-  Reviewed note no changes   Review Of Systems:  GEN- denies fatigue, fever, weight loss,weakness, recent illness HEENT- denies eye drainage, change in vision, nasal discharge, CVS- denies chest pain, palpitations RESP- denies SOB, cough, wheeze ABD- denies N/V, change in stools, abd pain GU- denies dysuria, hematuria, dribbling, incontinence MSK- denies joint pain, muscle aches, injury Neuro- denies headache, dizziness, syncope, seizure activity       Objective:    BP 110/78   Pulse 60   Temp 97.8 F (36.6 C) (Oral)   Resp 16   Ht 6' 8.9" (2.055 m)   Wt (!) 320 lb (145.2 kg)   BMI 34.38 kg/m  GEN- NAD, alert and oriented x3 CVS- RRR, no murmur RESP-CTAB EXT- No edema Pulses- Radial  2+        Assessment & Plan:      Problem List Items Addressed This Visit      Unprioritized   Obesity   Essential hypertension - Primary    Well controlled no changes       Diabetes mellitus without complication (HCC)    He is making major improvement to diet and weight Hold off on medications Recheck A1C at the 3 month mark Continue with exercise and low carb/low sugar eating  His goal weight is around 280lbs         Note: This dictation was prepared with Dragon dictation along with smaller phrase technology. Any transcriptional errors that result from this process are unintentional.

## 2016-11-08 ENCOUNTER — Encounter: Payer: Self-pay | Admitting: Family Medicine

## 2016-11-08 NOTE — Assessment & Plan Note (Signed)
He is making major improvement to diet and weight Hold off on medications Recheck A1C at the 3 month mark Continue with exercise and low carb/low sugar eating  His goal weight is around 280lbs

## 2016-11-08 NOTE — Assessment & Plan Note (Signed)
Well controlled no changes 

## 2017-01-31 IMAGING — CT CT HEAD W/O CM
1 series · 15 of 29 positions shown, 19 images · non-contrast
Comparison: No prior images available for comparison.

CLINICAL DATA: Follow-up hemorrhagic stroke with intraparenchymal
hemorrhage involving the left splenium of the corpus callosum

EXAM:
CT HEAD WITHOUT CONTRAST
TECHNIQUE: Contiguous axial images were obtained from the base of the skull
through the vertex without intravenous contrast.

[Series 3: head wo 5.0 h31s · axial · 0.47mm/px · z∈[-152,-22]mm · 15 of 29 slices shown, 19 images]
[im 2/29  brain]
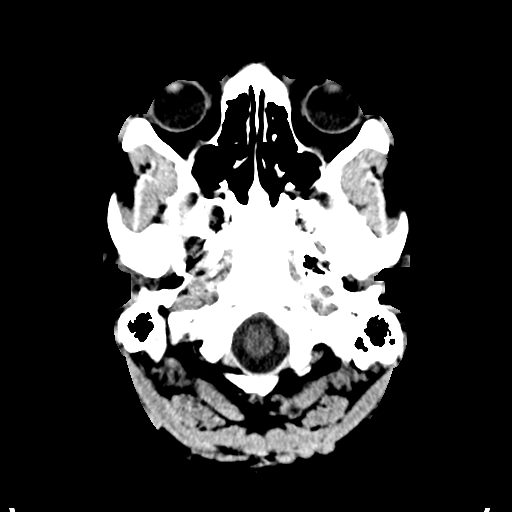
[im 2/29  bone]
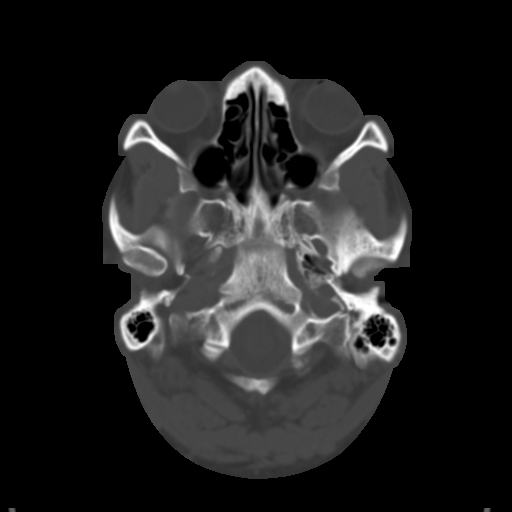
[im 4/29  brain]
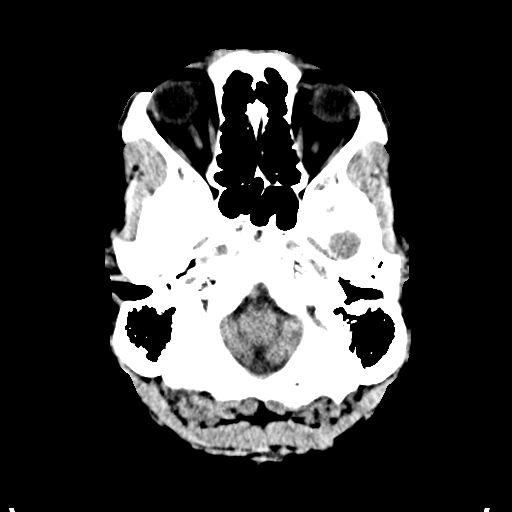
[im 6/29  brain]
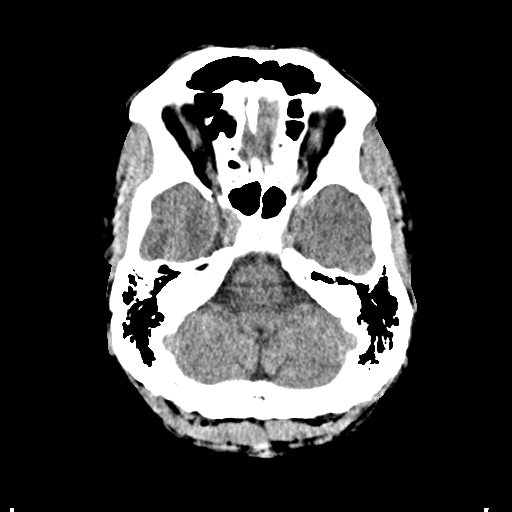
[im 8/29  brain]
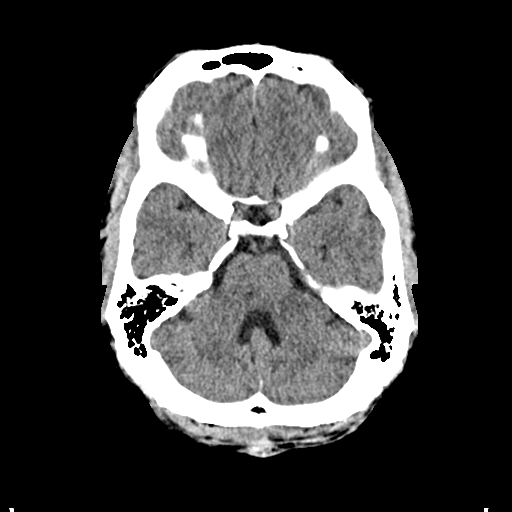
[im 10/29  brain]
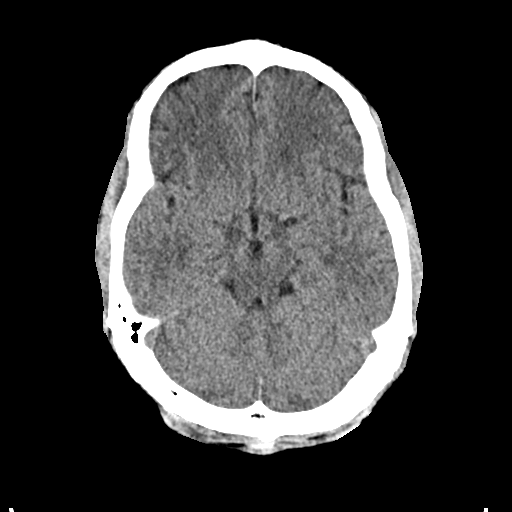
[im 10/29  bone]
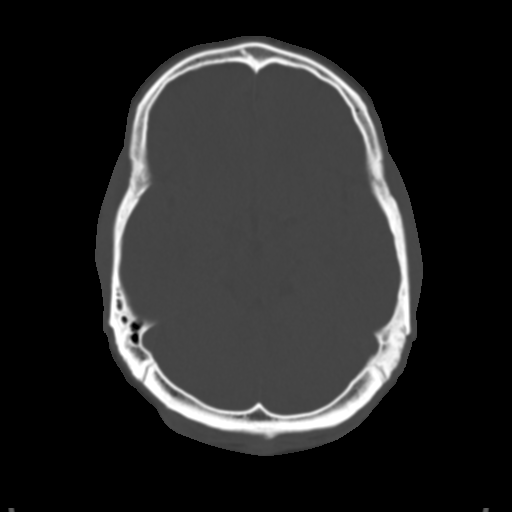
[im 11/29  brain]
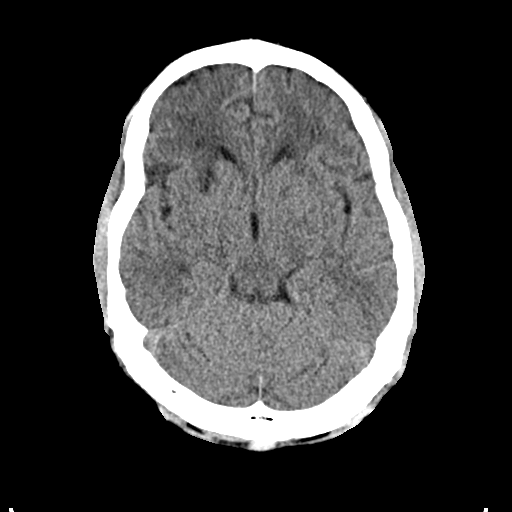
[im 13/29  brain]
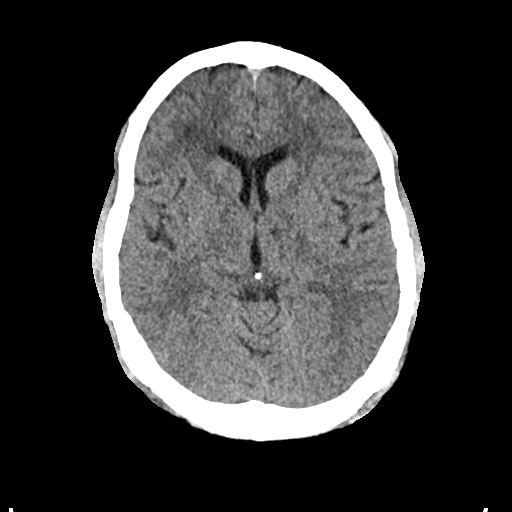
[im 15/29  brain]
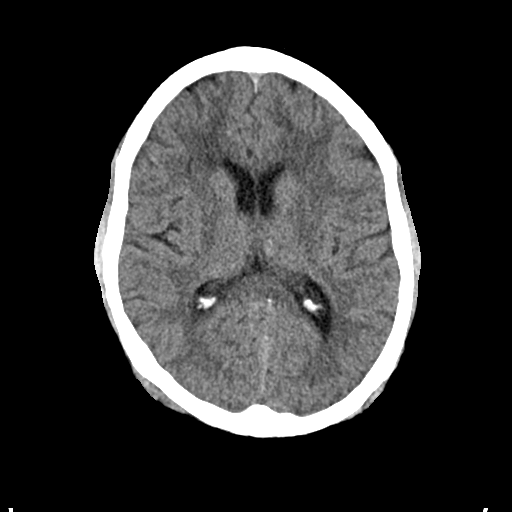
[im 17/29  brain]
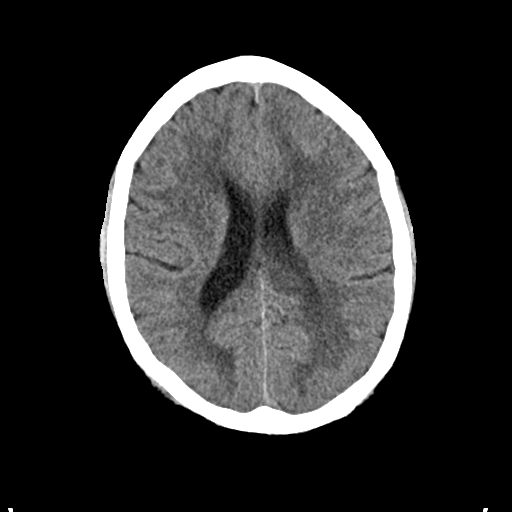
[im 17/29  bone]
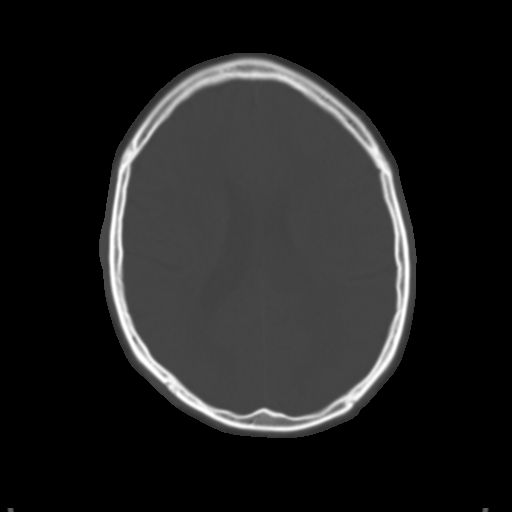
[im 19/29  brain]
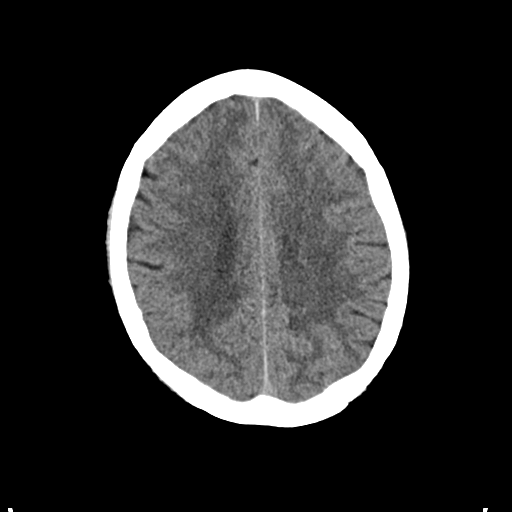
[im 20/29  brain]
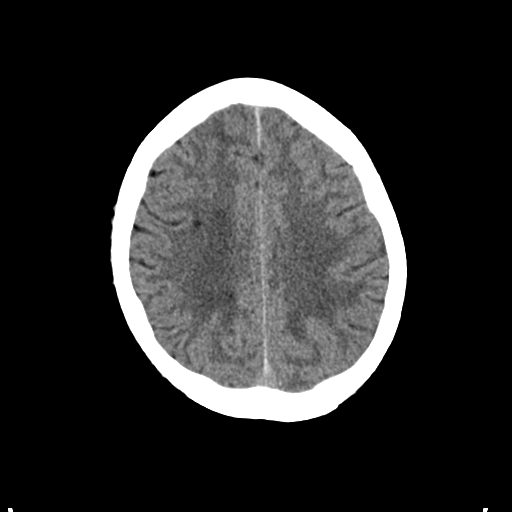
[im 22/29  brain]
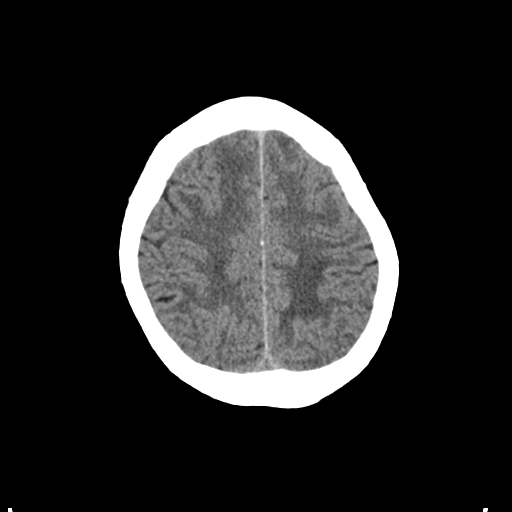
[im 24/29  brain]
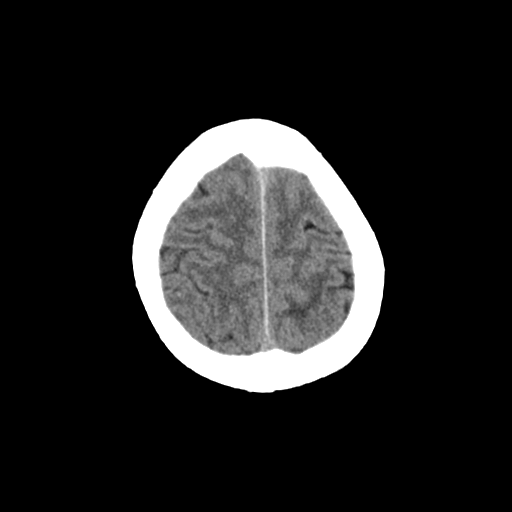
[im 24/29  bone]
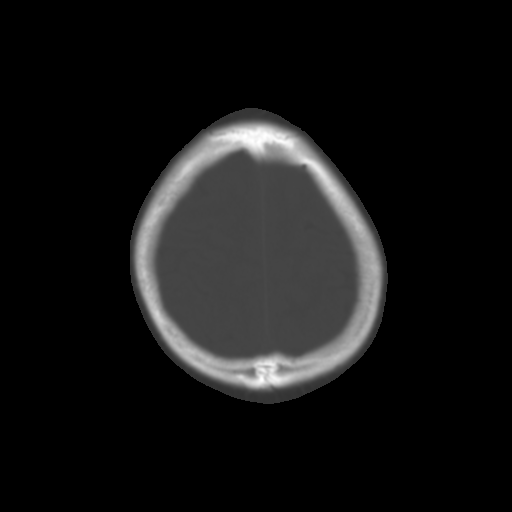
[im 26/29  brain]
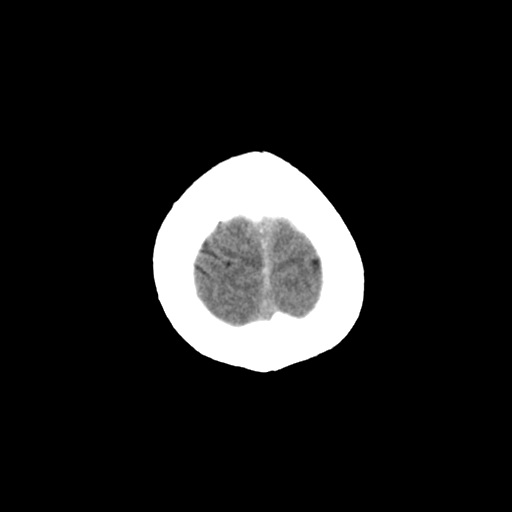
[im 28/29  brain]
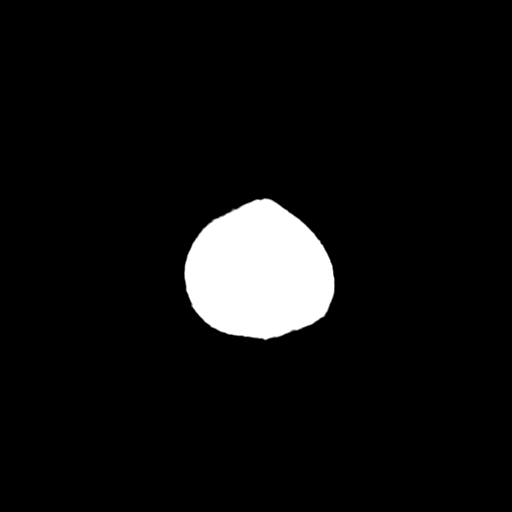

[15 of 29 positions shown; findings below may reference images not displayed]

FINDINGS: No evidence of parenchymal hemorrhage or extra-axial fluid
collection. No mass lesion, mass effect, or midline shift.

No CT evidence of acute infarction.

Old left thalamic lacunar infarct (series 3/ image 14). Possible
chronic right pontine lacunar infarct (series 3/image 7) versus
artifact. Old right caudate/basal ganglia lacunar infarct (series
3/image 11).

Subcortical white matter and periventricular small vessel ischemic
changes.

Cerebral volume is within normal limits.  No ventriculomegaly.

The visualized paranasal sinuses are essentially clear. The mastoid
air cells are unopacified.

No evidence of calvarial fracture.
IMPRESSION: No evidence of acute intracranial abnormality.

No evidence of parenchymal hemorrhage.

Prior lacunar infarcts with small vessel ischemic changes, as above.

## 2017-02-06 ENCOUNTER — Encounter: Payer: Self-pay | Admitting: Family Medicine

## 2017-02-06 ENCOUNTER — Ambulatory Visit: Payer: 59 | Admitting: Family Medicine

## 2017-02-06 ENCOUNTER — Other Ambulatory Visit: Payer: Self-pay

## 2017-02-06 VITALS — BP 116/74 | HR 82 | Temp 98.4°F | Resp 14 | Ht >= 80 in | Wt 313.0 lb

## 2017-02-06 DIAGNOSIS — Z6833 Body mass index (BMI) 33.0-33.9, adult: Secondary | ICD-10-CM

## 2017-02-06 DIAGNOSIS — N528 Other male erectile dysfunction: Secondary | ICD-10-CM

## 2017-02-06 DIAGNOSIS — E119 Type 2 diabetes mellitus without complications: Secondary | ICD-10-CM

## 2017-02-06 DIAGNOSIS — I10 Essential (primary) hypertension: Secondary | ICD-10-CM

## 2017-02-06 DIAGNOSIS — Z8673 Personal history of transient ischemic attack (TIA), and cerebral infarction without residual deficits: Secondary | ICD-10-CM

## 2017-02-06 DIAGNOSIS — E6609 Other obesity due to excess calories: Secondary | ICD-10-CM

## 2017-02-06 MED ORDER — TADALAFIL 20 MG PO TABS
20.0000 mg | ORAL_TABLET | Freq: Every day | ORAL | 2 refills | Status: DC | PRN
Start: 2017-02-06 — End: 2017-06-07

## 2017-02-06 NOTE — Assessment & Plan Note (Signed)
Diet controlled, check his A1c he continues to work on weight loss and healthy eating.  Goal is to come off of his cardiac medications with substantial weight loss.

## 2017-02-06 NOTE — Assessment & Plan Note (Signed)
Trial of Cialis, D/C Viagra

## 2017-02-06 NOTE — Progress Notes (Signed)
   Subjective:    Patient ID: Brad [name], male    DOB: 15-Oct-1960, 57 y.o.   MRN: [id number]  Patient presents for Follow-up (is not fasting) and Medication Management (would like to switch sildenafil to cialis) She is here to follow-up chronic medical problems.  Diabetes mellitus currently diet controlled he is declined medication trying to work on his exercise and weight loss. His last A1c was 6.8%.  He is also followed by cardiology for his blood pressure he is taking his medication as prescribed no side effects. Weight in October was 320 pounds.  HE FEELS GREAT, working out daily, watching his diet, down another 7lbs No concerns except wanting to swtich brands of his ED medication to the Cialis   Review Of Systems:  GEN- denies fatigue, fever, weight loss,weakness, recent illness HEENT- denies eye drainage, change in vision, nasal discharge, CVS- denies chest pain, palpitations RESP- denies SOB, cough, wheeze ABD- denies N/V, change in stools, abd pain GU- denies dysuria, hematuria, dribbling, incontinence MSK- denies joint pain, muscle aches, injury Neuro- denies headache, dizziness, syncope, seizure activity       Objective:    BP 116/74   Pulse 82   Temp 98.4 F (36.9 C) (Oral)   Resp 14   Ht 6\' 9"  (2.057 m)   Wt (!) 313 lb (142 kg)   SpO2 97%   BMI 33.54 kg/m  GEN- NAD, alert and oriented x3 CVS- RRR, no murmur RESP-CTAB EXT- No edema Pulses- Radial  2+ DP 2+             Assessment & Plan:      Problem List Items Addressed This Visit      Unprioritized   Obesity   Other male erectile dysfunction    Trial of Cialis, D/C Viagra      History of stroke    F/u neurology, no residual symptoms      Essential hypertension - Primary    Well controlled      Relevant Medications   tadalafil (CIALIS) 20 MG tablet   Other Relevant Orders   Basic metabolic panel   Diabetes mellitus without complication (HCC)    Diet controlled, check his A1c he  continues to work on weight loss and healthy eating.  Goal is to come off of his cardiac medications with substantial weight loss.      Relevant Orders   Microalbumin/Creatinine Ratio, Urine   Hemoglobin A1c   HM DIABETES FOOT EXAM (Completed)      Note: This dictation was prepared with Dragon dictation along with smaller phrase technology. Any transcriptional errors that result from this process are unintentional.

## 2017-02-06 NOTE — Assessment & Plan Note (Signed)
F/u neurology, no residual symptoms

## 2017-02-06 NOTE — Assessment & Plan Note (Signed)
Well controlled 

## 2017-02-06 NOTE — Patient Instructions (Signed)
F/U 4 months  

## 2017-02-07 LAB — MICROALBUMIN / CREATININE URINE RATIO
Creatinine, Urine: 202 mg/dL (ref 20–320)
MICROALB UR: 0.3 mg/dL
Microalb Creat Ratio: 1 mcg/mg creat (ref <30)

## 2017-02-07 LAB — BASIC METABOLIC PANEL
BUN: 16 mg/dL (ref 7–25)
CALCIUM: 9.5 mg/dL (ref 8.6–10.3)
CO2: 26 mmol/L (ref 20–32)
Chloride: 105 mmol/L (ref 98–110)
Creat: 1.26 mg/dL (ref 0.70–1.33)
Glucose, Bld: 126 mg/dL — ABNORMAL HIGH (ref 65–99)
POTASSIUM: 4.3 mmol/L (ref 3.5–5.3)
SODIUM: 139 mmol/L (ref 135–146)

## 2017-02-07 LAB — HEMOGLOBIN A1C
Hgb A1c MFr Bld: 5.6 % of total Hgb (ref <5.7)
Mean Plasma Glucose: 114 (calc)
eAG (mmol/L): 6.3 (calc)

## 2017-02-11 ENCOUNTER — Encounter: Payer: Self-pay | Admitting: *Deleted

## 2017-03-14 ENCOUNTER — Other Ambulatory Visit: Payer: Self-pay | Admitting: Cardiovascular Disease

## 2017-03-18 ENCOUNTER — Other Ambulatory Visit: Payer: Self-pay

## 2017-03-18 MED ORDER — AMLODIPINE BESYLATE 10 MG PO TABS: 10.0000 mg | ORAL_TABLET | Freq: Every day | ORAL | 3 refills | Status: DC

## 2017-03-18 MED ORDER — LOSARTAN POTASSIUM 100 MG PO TABS: 100.0000 mg | ORAL_TABLET | Freq: Every day | ORAL | 3 refills | Status: DC

## 2017-04-16 ENCOUNTER — Other Ambulatory Visit: Payer: Self-pay | Admitting: Family Medicine

## 2017-05-28 ENCOUNTER — Other Ambulatory Visit: Payer: Self-pay

## 2017-05-29 ENCOUNTER — Telehealth: Payer: Self-pay | Admitting: Cardiovascular Disease

## 2017-05-29 MED ORDER — LABETALOL HCL 200 MG PO TABS: 200.0000 mg | ORAL_TABLET | Freq: Two times a day (BID) | ORAL | 0 refills | Status: DC

## 2017-05-29 NOTE — Telephone Encounter (Signed)
New message    *STAT* If patient is at the pharmacy, call can be transferred to refill team.   1. Which medications need to be refilled? (please list name of each medication and dose if known) labetalol (NORMODYNE) 200 MG tablet  2. Which pharmacy/location (including street and city if local pharmacy) is medication to be sent to? [id number] - [place], [place] - [address] AT [facility] [address]  3. Do they need a 30 day or 90 day supply? 90

## 2017-05-29 NOTE — Telephone Encounter (Signed)
Follow up in 1 year.

## 2017-05-29 NOTE — Telephone Encounter (Signed)
Outpatient Medication Detail    Disp Refills Start End   labetalol (NORMODYNE) 200 MG tablet 180 tablet 0 05/29/2017    Sig - Route: Take 1 tablet (200 mg total) by mouth 2 (two) times daily. - Oral   Sent to pharmacy as: labetalol (NORMODYNE) 200 MG tablet   E-Prescribing Status: Receipt confirmed by pharmacy (05/29/2017 7:59 AM EDT)   Pharmacy   [id number] - [place], [place] - [address] AT [facility] OF [address] & [address]

## 2017-06-07 ENCOUNTER — Ambulatory Visit: Payer: 59 | Admitting: Family Medicine

## 2017-06-07 ENCOUNTER — Encounter: Payer: Self-pay | Admitting: Family Medicine

## 2017-06-07 ENCOUNTER — Other Ambulatory Visit: Payer: Self-pay | Admitting: Family Medicine

## 2017-06-07 ENCOUNTER — Other Ambulatory Visit: Payer: Self-pay

## 2017-06-07 VITALS — BP 122/68 | HR 64 | Temp 97.9°F | Resp 14 | Ht >= 80 in | Wt 319.0 lb

## 2017-06-07 DIAGNOSIS — E119 Type 2 diabetes mellitus without complications: Secondary | ICD-10-CM

## 2017-06-07 DIAGNOSIS — I10 Essential (primary) hypertension: Secondary | ICD-10-CM

## 2017-06-07 DIAGNOSIS — Z6834 Body mass index (BMI) 34.0-34.9, adult: Secondary | ICD-10-CM | POA: Diagnosis not present

## 2017-06-07 DIAGNOSIS — E6609 Other obesity due to excess calories: Secondary | ICD-10-CM | POA: Diagnosis not present

## 2017-06-07 DIAGNOSIS — G4733 Obstructive sleep apnea (adult) (pediatric): Secondary | ICD-10-CM | POA: Diagnosis not present

## 2017-06-07 MED ORDER — TADALAFIL 20 MG PO TABS: 20.0000 mg | ORAL_TABLET | Freq: Every day | ORAL | 2 refills | Status: DC | PRN

## 2017-06-07 MED ORDER — GLUCOSE BLOOD VI STRP: ORAL_STRIP | 12 refills | Status: AC

## 2017-06-07 NOTE — Patient Instructions (Signed)
F/U Sept for Physical  

## 2017-06-07 NOTE — Telephone Encounter (Signed)
Ok to refill Cialis? 

## 2017-06-07 NOTE — Progress Notes (Signed)
   Subjective:    Patient ID: Brad [name], male    DOB: Jul 29, 1960, 57 y.o.   MRN: [id number]  Patient presents for Follow-up (is fasting) Pt here to f/u chronic medical problems  Medications reviewed  DM- last A1C 5.6%, no current medications, CBG have been normal without medication  HTN- taking Losartan, Norvasc , labetolol   Obesity- has been lifting weights ,doing some cardio   OSA- using CPAP, neurology following    Due For EYE exam    Review Of Systems:  GEN- denies fatigue, fever, weight loss,weakness, recent illness HEENT- denies eye drainage, change in vision, nasal discharge, CVS- denies chest pain, palpitations RESP- denies SOB, cough, wheeze ABD- denies N/V, change in stools, abd pain GU- denies dysuria, hematuria, dribbling, incontinence MSK- denies joint pain, muscle aches, injury Neuro- denies headache, dizziness, syncope, seizure activity       Objective:    BP 122/68   Pulse 64   Temp 97.9 F (36.6 C) (Oral)   Resp 14   Ht  (2.057 m)   Wt (!) 319 lb (144.7 kg)   SpO2 98%   BMI 34.18 kg/m  GEN- NAD, alert and oriented x3 HEENT- PERRL, EOMI, non injected sclera, pink conjunctiva, MMM, oropharynx clear Neck- Supple, no thyromegaly CVS- RRR, no murmur RESP-CTAB ABD-NABS,soft,NT,ND EXT- No edema Pulses- Radial, DP- 2+        Assessment & Plan:      Problem List Items Addressed This Visit      Unprioritized   Obesity   OSA (obstructive sleep apnea)    F/u neurology for OSA Continues to work on diet, goal is to get him below 300lbs first      Essential hypertension    Controlled no changes       Relevant Orders   Lipid panel (Completed)   Diabetes mellitus without complication (HCC) - Primary    Diet controlled check A1C  ON ACEI      Relevant Orders   CBC with Differential/Platelet (Completed)   Comprehensive metabolic panel (Completed)   Hemoglobin A1c (Completed)   Lipid panel (Completed)      Note: This dictation  was prepared with Dragon dictation along with smaller phrase technology. Any transcriptional errors that result from this process are unintentional.

## 2017-06-07 NOTE — Telephone Encounter (Signed)
Pt needs refill on cialis and his test strips I asked the type of meter he has but he didn't know. [facility] [address].

## 2017-06-08 LAB — CBC WITH DIFFERENTIAL/PLATELET
BASOS PCT: 0.4 %
Basophils Absolute: 22 cells/uL (ref 0–200)
EOS ABS: 149 {cells}/uL (ref 15–500)
Eosinophils Relative: 2.7 %
HCT: 39.3 % (ref 38.5–50.0)
Hemoglobin: 13.2 g/dL (ref 13.2–17.1)
Lymphs Abs: 1832 cells/uL (ref 850–3900)
MCH: 28.9 pg (ref 27.0–33.0)
MCHC: 33.6 g/dL (ref 32.0–36.0)
MCV: 86 fL (ref 80.0–100.0)
MPV: 9.9 fL (ref 7.5–12.5)
Monocytes Relative: 10.5 %
NEUTROS PCT: 53.1 %
Neutro Abs: 2921 cells/uL (ref 1500–7800)
PLATELETS: 268 10*3/uL (ref 140–400)
RBC: 4.57 10*6/uL (ref 4.20–5.80)
RDW: 12.8 % (ref 11.0–15.0)
TOTAL LYMPHOCYTE: 33.3 %
WBC: 5.5 10*3/uL (ref 3.8–10.8)
WBCMIX: 578 {cells}/uL (ref 200–950)

## 2017-06-08 LAB — COMPREHENSIVE METABOLIC PANEL
AG Ratio: 1.8 (calc) (ref 1.0–2.5)
ALKALINE PHOSPHATASE (APISO): 51 U/L (ref 40–115)
ALT: 21 U/L (ref 9–46)
AST: 18 U/L (ref 10–35)
Albumin: 4.3 g/dL (ref 3.6–5.1)
BILIRUBIN TOTAL: 0.7 mg/dL (ref 0.2–1.2)
BUN: 21 mg/dL (ref 7–25)
CALCIUM: 9.4 mg/dL (ref 8.6–10.3)
CHLORIDE: 106 mmol/L (ref 98–110)
CO2: 28 mmol/L (ref 20–32)
CREATININE: 1.26 mg/dL (ref 0.70–1.33)
GLOBULIN: 2.4 g/dL (ref 1.9–3.7)
Glucose, Bld: 113 mg/dL — ABNORMAL HIGH (ref 65–99)
Potassium: 4.6 mmol/L (ref 3.5–5.3)
Sodium: 140 mmol/L (ref 135–146)
Total Protein: 6.7 g/dL (ref 6.1–8.1)

## 2017-06-08 LAB — LIPID PANEL
Cholesterol: 116 mg/dL (ref <200)
HDL: 42 mg/dL (ref >40)
LDL Cholesterol (Calc): 61 mg/dL (calc)
NON-HDL CHOLESTEROL (CALC): 74 mg/dL (ref <130)
Total CHOL/HDL Ratio: 2.8 (calc) (ref <5.0)
Triglycerides: 47 mg/dL (ref <150)

## 2017-06-08 LAB — HEMOGLOBIN A1C
HEMOGLOBIN A1C: 6 %{Hb} — AB (ref <5.7)
Mean Plasma Glucose: 126 (calc)
eAG (mmol/L): 7 (calc)

## 2017-06-09 ENCOUNTER — Encounter: Payer: Self-pay | Admitting: Family Medicine

## 2017-06-09 NOTE — Assessment & Plan Note (Signed)
F/u neurology for OSA Continues to work on diet, goal is to get him below 300lbs first

## 2017-06-09 NOTE — Assessment & Plan Note (Signed)
Diet controlled check A1C  ON ACEI

## 2017-06-09 NOTE — Assessment & Plan Note (Signed)
Controlled no changes 

## 2017-07-23 LAB — HM DIABETES EYE EXAM

## 2017-07-25 ENCOUNTER — Encounter: Payer: Self-pay | Admitting: *Deleted

## 2017-08-08 ENCOUNTER — Telehealth: Payer: Self-pay | Admitting: *Deleted

## 2017-08-08 ENCOUNTER — Encounter: Payer: Self-pay | Admitting: Neurology

## 2017-08-08 ENCOUNTER — Ambulatory Visit: Payer: 59 | Admitting: Neurology

## 2017-08-08 VITALS — BP 134/84 | HR 56 | Ht >= 80 in | Wt 322.0 lb

## 2017-08-08 DIAGNOSIS — Z9989 Dependence on other enabling machines and devices: Secondary | ICD-10-CM | POA: Diagnosis not present

## 2017-08-08 DIAGNOSIS — G4733 Obstructive sleep apnea (adult) (pediatric): Secondary | ICD-10-CM | POA: Diagnosis not present

## 2017-08-08 NOTE — Patient Instructions (Signed)
Keep up the good work with CPAP!  We can see you in 1 year.

## 2017-08-08 NOTE — Progress Notes (Signed)
Subjective:    Patient ID: Brad [name] is a 57 y.o. male.  HPI     Interim history:   Brad [name] is a 57 year old right-handed gentleman with an underlying medical history of hypertension, prior lacunar strokes, recent hemorrhagic stroke in April 2016, recent smoking cessation, and obesity, who presents for follow up consultation of his mild to moderate obstructive sleep apnea, on CPAP therapy, for his yearly checkup. The patient is unaccompanied today. I last saw him on 08/08/2016, at which time he was compliant with CPAP and reported ongoing good results. Working on weight loss as well.  Today, 08/08/2017: I reviewed his CPAP compliance data from 07/07/2017 through 08/05/2017 which is a total of 30 days, during which time he used his CPAP every night with percent used days greater than 4 hours at 97%, indicating excellent compliance with an average usage of 5 hours and 53 minutes, residual AHI at goal at 0.5 per hour, leak acceptable with the 95th percentile at 7.9 L/m on a pressure of 7 cm with EPR of 1. He reports doing well. Working on weight loss, greatest to the gym daily. Is compliant with CPAP with ongoing good results reported, needs supplies from his DME company, [employer].  The patient's allergies, current medications, family history, past medical history, past social history, past surgical history and problem list were reviewed and updated as appropriate.    Previously:  I reviewed his CPAP compliance data from 07/08/2016 through 08/06/2016, which is a total of 30 days, during which time he used his CPAP every night with percent used days greater than 4 hours at 90%, indicating excellent compliance with an average usage of 5 hours and 3 minutes, residual AHI low at 0.5 per hour, leak acceptable with the 95th percentile at 14.6 L/m on a pressure of 7 cm with EPR of 1.   I saw him 02/09/2015, at which time he reported doing fairly well. He was tolerating CPAP and he was compliant with  treatment. He indicated that he would wake up rested and daytime energy was better. He was working on weight loss and exercising regularly. We talked about is his baseline sleep study results from August 2016 and his CPAP titration study results from September 2016 in detail at the time. He was encouraged to continue with CPAP therapy regularly.   I reviewed his CPAP compliance data from 07/09/2015 through 08/07/2015 which is a total of 30 days during which time he used his machine 24 days with percent used days greater than 4 hours at 60%, indicating suboptimal compliance with an average usage for all nights of 3 hours and 3 minutes, residual AHI 1.1 per hour, leak acceptable with the 95th percentile at 15.6 L/m on a pressure of 7 cm with EPR of 1.   I first met him on 07/20/2014 at the request of Dr. Jaynee Eagles, at which time the patient reported snoring, multiple nighttime awakenings, nocturia and excessive daytime somnolence. I invited him back for sleep study. He had a baseline sleep study, followed by a CPAP titration study. I went over his test results with him in detail today. His baseline sleep study from 09/03/2014 showed a sleep efficiency of 63.9% with a latency to sleep of 81.5 minutes, and wake after sleep onset of 78 minutes with mild to moderate sleep fragmentation noted. He had an increased percentage of stage II sleep, absence of slow-wave sleep and REM sleep at 26.1% with a REM latency of 56 minutes. He had no significant PLMS,  EKG or EEG changes. He had mild to moderate snoring. He did not have any supine sleep. Total AHI was 10 per hour, rising to 20.4 per hour during REM sleep. Average oxygen saturation was 96%, nadir was 90%. Given his sleep related complaints and his history of stroke, I invited him back for a full night CPAP titration study. He had this on 10/08/2014: Sleep efficiency was 73.6% with a latency of sleep of 13.5 minutes and wake after sleep onset of 87 minutes with mild sleep  fragmentation noted. He had an increased percentage of stage 2 sleep, absence of slow wave sleep and increased REM sleep at 34.8% with a mildly reduced REM latency of 51.5 minutes. He had no significant PLMS, EKG or EEG changes. Average oxygen saturation was 94%, nadir was 90%. CPAP was titrated from 5 cm to 8 cm. AHI was 0 per hour on a pressure of 7 cm. Based on his test results I prescribed CPAP therapy for home use.   I reviewed his CPAP compliance data from 01/09/2015 through 02/07/2015 which is a total of 30 days during which time he used his machine 27 days with percent used days greater than 4 hours at 80%, indicating very good compliance with an average usage of 4 hours and 52 minutes, residual AHI low at 0.7 per hour, leak at times high with the 95th percentile at 28.7 L/m on a pressure of 7 cm with EPR of 1.   07/20/2014: He reports snoring, has multiple nighttime awakenings, has nocturia and excessive daytime somnolence. His ESS is 20/24. His snoring can be loud according to his wife, who provides most of his history. His bedtime varies. He may be in bed around 10 PM but typically does not fall asleep until late at night. He watches TV in bed. He may fall asleep around 2 AM. He goes to the bathroom multiple times each night, an average of 5 times per night. He denies morning headaches. He's currently not working. He works in Psychologist, educational at [employer]. He works 12 hour shifts, from 7 AM to 7 PM. He works in [place]. He lives in [place]. He denies restless leg symptoms. He's not known to twitch in his sleep according to his wife. He denies peripheral edema. He does not drink caffeine every day. He denies alcohol consumption. He quit smoking in April 2016. His rise time varies. He may be up by 5 AM.  His Past Medical History Is Significant For: Past Medical History:  Diagnosis Date  . Abnormal ultrasound of carotid artery 04/2014   no significant obstruction  . Ataxia 04/2014  . Diabetes ([place])    . EKG abnormalities 04/2014   poor R wave progression, NSR  . Former smoker    25 years x 1.5 ppd, stopped 04/2014  . Gait disturbance 04/2014   s/p stroke  . H/O echocardiogram 04/2014   moderate LVH, 60-65% EF, no valve disease  . History of heart attack   . Hypertensive heart disease 04/2014  . Intraparenchymal hemorrhage of brain [facility]) 05/05/14   hospitalization [facility]  . Short-term memory loss 04/2014  . Stroke [facility]) 04/2014    His Past Surgical History Is Significant For: Past Surgical History:  Procedure Laterality Date  . APPENDECTOMY    . HEMORRHOID SURGERY      His Family History Is Significant For: Family History  Problem Relation Age of Onset  . Heart disease Mother   . Hypertension Mother   . Hypertension Brother   .  Aneurysm Paternal Grandmother     His Social History Is Significant For: Social History   Socioeconomic History  . Marital status: Married    Spouse name: [employer]  . Number of children: 2  . Years of education: 55  . Highest education level: Not on file  Occupational History  . Not on file  Social Needs  . Financial resource strain: Not on file  . Food insecurity:    Worry: Not on file    Inability: Not on file  . Transportation needs:    Medical: Not on file    Non-medical: Not on file  Tobacco Use  . Smoking status: Former Smoker    Packs/day: 1.50    Years: 25.00    Pack years: 37.50    Last attempt to quit: 05/05/2014    Years since quitting: 3.2  . Smokeless tobacco: Never Used  Substance and Sexual Activity  . Alcohol use: No    Alcohol/week: 0.0 oz  . Drug use: No  . Sexual activity: Not on file  Lifestyle  . Physical activity:    Days per week: Not on file    Minutes per session: Not on file  . Stress: Not on file  Relationships  . Social connections:    Talks on phone: Not on file    Gets together: Not on file    Attends religious service: Not on file    Active member of club or organization:  Not on file    Attends meetings of clubs or organizations: Not on file    Relationship status: Not on file  Other Topics Concern  . Not on file  Social History Narrative   Lives at home with wife   Married to [employer], non denominational, works at [employer] in [place], [place].  From [place], but moved to [place] 2016.     Caffeine use: Drinks tea occass    His Allergies Are:  No Known Allergies:   His Current Medications Are:  Outpatient Encounter Medications as of 08/08/2017  Medication Sig  . amLODipine (NORVASC) 10 MG tablet Take 1 tablet (10 mg total) by mouth daily.  . blood glucose meter kit and supplies KIT Dispense based on patient and insurance preference. Check fasting blood sugar once daily ICD10 R73.03  . Blood Glucose Monitoring Suppl (BLOOD GLUCOSE SYSTEM PAK) KIT Dispense per patient and insurance preference. Use as directed to monitor FSBS 1x daily. Dx: R73.03.  . glucose blood test strip Use as instructed to monitor FSBS 1x daily. Dx: R73.09  . labetalol (NORMODYNE) 200 MG tablet Take 1 tablet (200 mg total) by mouth 2 (two) times daily.  . Lancets (ONETOUCH ULTRASOFT) lancets Use as instructed  . losartan (COZAAR) 100 MG tablet Take 1 tablet (100 mg total) by mouth daily.  . sildenafil (VIAGRA) 100 MG tablet TAKE 1 TABLET BY MOUTH EVERY DAY AS NEEDED FOR ERECTILE DYSFUNCTION  . spironolactone (ALDACTONE) 50 MG tablet TAKE 1 TABLET(50 MG) BY MOUTH DAILY  . tadalafil (CIALIS) 20 MG tablet Take 1 tablet (20 mg total) by mouth daily as needed for erectile dysfunction.   No facility-administered encounter medications on file as of 08/08/2017.   :  Review of Systems:  Out of a complete 14 point review of systems, all are reviewed and negative with the exception of these symptoms as listed below: Review of Systems  Neurological:       Patient following up today for CPAP. DME [facility]. Pt states machine is working  well. Pt is in need of supplies.     Objective:   Neurological Exam  Physical Exam Physical Examination:   Vitals:   08/08/17 1021  BP: 134/84  Pulse: (!) 56    General Examination: The patient is a very pleasant 57 y.o. male in no acute distress. He appears well-developed and well-nourished and well groomed. Good spirits.  HEENT:Normocephalic, atraumatic, pupils are equal, round and reactive to light and accommodation. Extraocular tracking is good without limitation to gaze excursion or nystagmus noted. Normal smooth pursuit is noted. Hearing is grossly intact. Face is symmetric with normal facial animation and normal facial sensation. Speech is clear, no hypophonia. There is no lip, neck/head, jaw or voice tremor. Oropharynx exam reveals: stable findings, marked airway crowding. Tongue protrudes centrally and palate elevates symmetrically.   Chest:Clear to auscultation without wheezing, rhonchi or crackles noted.  Heart:S1+S2+0, regular and normal without murmurs, rubs or gallops noted.   Abdomen:Soft, non-tender and non-distended with normal bowel sounds appreciated on auscultation.  Extremities:There are no new findings.   Skin: Warm and dry without trophic changes noted.   Musculoskeletal: exam reveals no obvious joint deformities, tenderness or joint swelling or erythema.   Neurologically:  Mental status: The patient is awake, alert and oriented in all 4 spheres. His immediate and remote memory, attention, language skills and fund of knowledge are appropriate. There is no evidence of aphasia, agnosia, apraxia or anomia. Speech is clear with normal prosody and enunciation. Thought process is linear. Mood and affect are normal.  Cranial nerves II - XII are as described above under HEENT exam. In addition: shoulder shrug is normal with equal shoulder height noted. Motor exam: Normal bulk, strength and tone is noted. There is no tremor or rebound. Romberg is negative except for minimal sway. Fine motor skills are  grossly intact.  Sensory exam: intact to light touch.  Gait, station and balance: He stands without difficulty. Posture is age-appropriate, has a slight lean to the left and stance is narrow based, toes point out b/l. Gait shows normal stride length and normal pace. No problems turning are noted.    Assessment and Plan:   In summary, Shriyan Arakawa is a very pleasant 57 year old male with an underlying medical history ofdifficult to controlhypertension, on multiple antihypertensives,hyperlipidemia, lacunar strokes, hemorrhagic stroke in April 2016, and obesity, whopresents for follow-up consultation of his obstructive sleep apnea, well established on CPAP therapy at 7 cm with ongoing good results reported. He had a baseline sleep study in August 2016, CPAP titration study in September 2016 and has since then establish treatment at home. His sleep schedule is somewhat erratic and he reports that he does not typically have a set bedtime and rise time, average usage has improved overall compared to last year with nearly 6 hours whereas he was closer to 5 hours last year. He's working on weight loss and is commended for his treatment compliance and weight loss endeavors. Physical exam is stable. I suggested a one-year checkup, sooner if needed. He has not had to see Dr. Jaynee Eagles in the interim. I answered all his questions today and he was in agreement with the plan. I placed an order for his CPAP supplies today as well. I spent 20 minutes in total face-to-face time with the patient, more than 50% of which was spent in counseling and coordination of care, reviewing test results, reviewing medication and discussing or reviewing the diagnosis of OSA, its prognosis and treatment options. Pertinent laboratory and imaging test  results that were available during this visit with the patient were reviewed by me and considered in my medical decision making (see chart for details).

## 2017-08-08 NOTE — Telephone Encounter (Signed)
Faxed orders for CPAP supplies (mask refit, new mask, hose, filters and related supplies such as chin strap prn and if needed, a new humidification unit) to [facility] at [phone]. Received fax confirmation.

## 2017-09-19 ENCOUNTER — Encounter: Payer: Self-pay | Admitting: Cardiovascular Disease

## 2017-10-11 ENCOUNTER — Encounter: Payer: Self-pay | Admitting: Cardiovascular Disease

## 2017-10-11 ENCOUNTER — Ambulatory Visit: Payer: 59 | Admitting: Cardiovascular Disease

## 2017-10-11 VITALS — BP 118/78 | HR 56 | Ht >= 80 in | Wt 325.0 lb

## 2017-10-11 DIAGNOSIS — I10 Essential (primary) hypertension: Secondary | ICD-10-CM | POA: Diagnosis not present

## 2017-10-11 MED ORDER — LOSARTAN POTASSIUM 100 MG PO TABS: 100.0000 mg | ORAL_TABLET | Freq: Every day | ORAL | 3 refills | Status: DC

## 2017-10-11 MED ORDER — SPIRONOLACTONE 50 MG PO TABS: ORAL_TABLET | ORAL | 3 refills | Status: DC

## 2017-10-11 MED ORDER — AMLODIPINE BESYLATE 10 MG PO TABS: 10.0000 mg | ORAL_TABLET | Freq: Every day | ORAL | 3 refills | Status: DC

## 2017-10-11 MED ORDER — LABETALOL HCL 200 MG PO TABS: 200.0000 mg | ORAL_TABLET | Freq: Two times a day (BID) | ORAL | 3 refills | Status: DC

## 2017-10-11 NOTE — Patient Instructions (Signed)
Medication Instructions:  Your physician recommends that you continue on your current medications as directed. Please refer to the Current Medication list given to you today.   Labwork: None ordered  Testing/Procedures: None ordered  Follow-Up: You have been referred to the [facility]   Your physician wants you to follow-up in: 1 year with Dr.nahser. You will receive a reminder letter in the mail two months in advance. If you don't receive a letter, please call our office to schedule the follow-up appointment.   Any Other Special Instructions Will Be Listed Below (If Applicable).     If you need a refill on your cardiac medications before your next appointment, please call your pharmacy.

## 2017-10-11 NOTE — Progress Notes (Signed)
Cardiology Office Note   Date:  10/11/2017   ID:  Brad [name], DOB 1960-08-08, MRN [id number]  PCP:  Alycia Rossetti, MD  Cardiologist:   Mertie Moores, MD   Chief Complaint  Patient presents with  . Hypertension   Problem list: 1. 1. Malignant hypertension - Echo from the past  shows normal left ventricular  systolic function with EF of 60-65% with moderate LVH 2. Intracranial hemorrhage caused by malignant hypertension 3. Non-ST segment elevation myocardial infarction-in the setting of malignant hypertension,  Echo shows normal LF function with EF of 60-65% with no segmental wall abn.     Brad [name] is a 57 y.o. male who presents for follow up for malignant hypertension and a type 2 non-ST segment myocardial infarction.  He's been doing fairly well from a cardiac point. He's not had any episodes of chest discomfort. He's currently in rehabilitation for intracranial hemorrhage. His blood pressure is much better controlled.  He has lost quite a bit of weight.   Oct. 26, 2016:  No Cp or dyspnea.  BP is fairly well controlled.   May 23, 2015:  [place] is doing well.  BP is well controlled. Is recovering from his CVA  Walking    OCt. 23, 2017:   No CP or dyspnea.  Echo  Form [place], May 06, 2014 - Normal left ventricle systolic function - ejection fraction 60-65%. He has concentric left ventricular hypertrophy.  Works out every day   Jan. 23, 2018:  Feeling well.   Works out every day . Goes to the gym every day  Weight today is 329.  Sept. 13, 2018 Wt = 332 Brad [name] is seen today  BP is well controlled.  No CP or dyspnea  Had stopped the Aldactone,  Is back on it now Is ready to go on a diet - HbA1C is up  Labs from 9/11 look good  October 11, 2017: Has been working out regularly  Had lost some weight but has gained some back   Past Medical History:  Diagnosis Date  . Abnormal ultrasound of carotid artery 04/2014   no significant obstruction  .  Ataxia 04/2014  . Diabetes ([place])   . EKG abnormalities 04/2014   poor R wave progression, NSR  . Former smoker    25 years x 1.5 ppd, stopped 04/2014  . Gait disturbance 04/2014   s/p stroke  . H/O echocardiogram 04/2014   moderate LVH, 60-65% EF, no valve disease  . History of heart attack   . Hypertensive heart disease 04/2014  . Intraparenchymal hemorrhage of brain [facility]) 05/05/14   hospitalization [facility]  . Short-term memory loss 04/2014  . Stroke [facility]) 04/2014    Past Surgical History:  Procedure Laterality Date  . APPENDECTOMY    . HEMORRHOID SURGERY       Current Outpatient Medications  Medication Sig Dispense Refill  . amLODipine (NORVASC) 10 MG tablet Take 1 tablet (10 mg total) by mouth daily. 90 tablet 3  . blood glucose meter kit and supplies KIT Dispense based on patient and insurance preference. Check fasting blood sugar once daily ICD10 R73.03 1 each 0  . Blood Glucose Monitoring Suppl (BLOOD GLUCOSE SYSTEM PAK) KIT Dispense per patient and insurance preference. Use as directed to monitor FSBS 1x daily. Dx: R73.03. 1 each 1  . glucose blood test strip Use as instructed to monitor FSBS 1x daily. Dx: R73.09 100 each 12  . labetalol (NORMODYNE) 200 MG tablet Take  1 tablet (200 mg total) by mouth 2 (two) times daily. 180 tablet 0  . Lancets (ONETOUCH ULTRASOFT) lancets Use as instructed 100 each 12  . losartan (COZAAR) 100 MG tablet Take 1 tablet (100 mg total) by mouth daily. 90 tablet 3  . sildenafil (VIAGRA) 100 MG tablet TAKE 1 TABLET BY MOUTH EVERY DAY AS NEEDED FOR ERECTILE DYSFUNCTION 5 tablet 3  . spironolactone (ALDACTONE) 50 MG tablet TAKE 1 TABLET(50 MG) BY MOUTH DAILY 90 tablet 1  . tadalafil (CIALIS) 20 MG tablet Take 1 tablet (20 mg total) by mouth daily as needed for erectile dysfunction. 10 tablet 2   No current facility-administered medications for this visit.     Allergies:   Patient has no known allergies.    Social History:  The  patient  reports that he quit smoking about 3 years ago. He has a 37.50 pack-year smoking history. He has never used smokeless tobacco. He reports that he does not drink alcohol or use drugs.   Family History:  The patient's family history includes Aneurysm in his paternal grandmother; Heart disease in his mother; Hypertension in his brother and mother.    ROS:  Please see the history of present illness.       Physical Exam: Blood pressure 118/78, pulse (!) 56, height '6\' 8"'$  (2.032 m), weight (!) 325 lb (147.4 kg), SpO2 96 %.  GEN:  Middle age man,  Moderately obese  HEENT: Normal NECK: No JVD; No carotid bruits LYMPHATICS: No lymphadenopathy CARDIAC: RRR  RESPIRATORY:  Clear to auscultation without rales, wheezing or rhonchi  ABDOMEN: Soft, non-tender, non-distended MUSCULOSKELETAL:  No edema; No deformity  SKIN: Warm and dry NEUROLOGIC:  Alert and oriented x 3   EKG:   October 11, 2017: Sinus bradycardia 56 beats minute.  Left axis deviation.   Recent Labs: 06/07/2017: ALT 21; BUN 21; Creat 1.26; Hemoglobin 13.2; Platelets 268; Potassium 4.6; Sodium 140    Lipid Panel    Component Value Date/Time   CHOL 116 06/07/2017 0926   CHOL 84 (L) 10/01/2014 0001   TRIG 47 06/07/2017 0926   TRIG 51 10/01/2014 0001   HDL 42 06/07/2017 0926   HDL 40 10/01/2014 0001   CHOLHDL 2.8 06/07/2017 0926   VLDL 9 11/21/2015 1124   LDLCALC 61 06/07/2017 0926   LDLCALC 34 10/01/2014 0001      Wt Readings from Last 3 Encounters:  10/11/17 (!) 325 lb (147.4 kg)  08/08/17 (!) 322 lb (146.1 kg)  06/07/17 (!) 319 lb (144.7 kg)      Other studies Reviewed: Additional studies/ records that were reviewed today include: . Review of the above records demonstrates:    ASSESSMENT AND PLAN:  1.  Malignant hypertension -   BP is well controlled.   2. Intracranial hemorrhage   - no residual deficits  3. Non-ST segment elevation myocardial infarction-i   In the setting of malignant  HTN No angina Lipids look great.   Current medicines are reviewed at length with the patient today.  The patient does not have concerns regarding medicines.  The following changes have been made:  no change  Labs/ tests ordered today include:  No orders of the defined types were placed in this encounter.    Disposition:   FU with me in 6  months .     Mertie Moores, MD  10/11/2017 9:22 AM    [facility] [address], [place], [place]  [address] Phone: [phone])  [phone]; Fax: [phone]

## 2017-10-14 ENCOUNTER — Other Ambulatory Visit: Payer: Self-pay | Admitting: Family Medicine

## 2017-10-14 NOTE — Telephone Encounter (Signed)
Ok to refill 

## 2018-01-15 DIAGNOSIS — G4733 Obstructive sleep apnea (adult) (pediatric): Secondary | ICD-10-CM | POA: Diagnosis not present

## 2018-02-18 ENCOUNTER — Other Ambulatory Visit: Payer: Self-pay

## 2018-02-18 ENCOUNTER — Ambulatory Visit: Payer: 59 | Admitting: Family Medicine

## 2018-02-18 ENCOUNTER — Encounter: Payer: Self-pay | Admitting: Family Medicine

## 2018-02-18 VITALS — BP 130/74 | HR 66 | Temp 98.5°F | Resp 16 | Ht >= 80 in | Wt 333.0 lb

## 2018-02-18 DIAGNOSIS — Z8673 Personal history of transient ischemic attack (TIA), and cerebral infarction without residual deficits: Secondary | ICD-10-CM

## 2018-02-18 DIAGNOSIS — I10 Essential (primary) hypertension: Secondary | ICD-10-CM | POA: Diagnosis not present

## 2018-02-18 DIAGNOSIS — E119 Type 2 diabetes mellitus without complications: Secondary | ICD-10-CM

## 2018-02-18 DIAGNOSIS — Z6836 Body mass index (BMI) 36.0-36.9, adult: Secondary | ICD-10-CM

## 2018-02-18 MED ORDER — LIRAGLUTIDE 18 MG/3ML ~~LOC~~ SOPN: PEN_INJECTOR | SUBCUTANEOUS | 3 refills | Status: DC

## 2018-02-18 NOTE — Assessment & Plan Note (Signed)
Discussed portion sizes, carbs

## 2018-02-18 NOTE — Assessment & Plan Note (Addendum)
Recheck A1C goal < 7% unfortunately his insurance will not cover Saxenda Given trial of victoza to control blood sugar and help weight Start 0.6mg  titrate every 2 weeks to 1.8mg  daily  Needs urine micro/foot exam next visit

## 2018-02-18 NOTE — Assessment & Plan Note (Signed)
Well controlled no changes to meds 

## 2018-02-18 NOTE — Progress Notes (Signed)
   Subjective:    Patient ID: Brad [name], male    DOB: 22-Nov-1960, 58 y.o.   MRN: [id number]  Patient presents for Discuss Weight Loss (would like to try Saxenda)  Pt here to f/u chronic medical problems No specific concerns but would like help losing weight. His wife is on victoza. His ultimate goal is to get off his cardiac medications but he has picked up weight since last May,weight up 14lbs. Admits to larger portion sizes   A1C last 6% in May - diet controlled HTN- no difficulty with meds, bp has been controlled, no CP, SOB, exercising regulary    Review Of Systems:  GEN- denies fatigue, fever, weight loss,weakness, recent illness HEENT- denies eye drainage, change in vision, nasal discharge, CVS- denies chest pain, palpitations RESP- denies SOB, cough, wheeze ABD- denies N/V, change in stools, abd pain GU- denies dysuria, hematuria, dribbling, incontinence MSK- denies joint pain, muscle aches, injury Neuro- denies headache, dizziness, syncope, seizure activity       Objective:    BP 130/74   Pulse 66   Temp 98.5 F (36.9 C) (Oral)   Resp 16   Ht 6\' 8"  (2.032 m)   Wt (!) 333 lb (151 kg)   SpO2 95%   BMI 36.58 kg/m  GEN- NAD, alert and oriented x3,obese  HEENT- PERRL, EOMI, non injected sclera, pink conjunctiva, MMM, oropharynx clear Neck- Supple, no thyromegaly CVS- RRR, no murmur RESP-CTAB EXT- No edema Pulses- Radial2+        Assessment & Plan:      Problem List Items Addressed This Visit      Unprioritized   Diabetes mellitus without complication (HCC)    Recheck A1C goal < 7% unfortunately his insurance will not cover Saxenda Given trial of victoza to control blood sugar and help weight Start 0.6mg  titrate every 2 weeks to 1.8mg  daily  Needs urine micro/foot exam next visit        Relevant Medications   liraglutide (VICTOZA) 18 MG/3ML SOPN   Other Relevant Orders   Hemoglobin A1c   Essential hypertension - Primary    Well controlled no  changes to meds      Relevant Orders   Comprehensive metabolic panel   CBC with Differential/Platelet (Completed)   History of stroke   Obesity    Discussed portion sizes, carbs      Relevant Medications   liraglutide (VICTOZA) 18 MG/3ML SOPN      Note: This dictation was prepared with Dragon dictation along with smaller phrase technology. Any transcriptional errors that result from this process are unintentional.

## 2018-02-18 NOTE — Patient Instructions (Addendum)
F/U 2 months  Start victoza 0.6mg  once a day for 2 weeks, then increase to 1.2mg  once a day for 2 weeks, up 1.8mg 

## 2018-02-19 ENCOUNTER — Other Ambulatory Visit: Payer: Self-pay | Admitting: *Deleted

## 2018-02-19 LAB — COMPREHENSIVE METABOLIC PANEL
AG RATIO: 1.5 (calc) (ref 1.0–2.5)
ALT: 21 U/L (ref 9–46)
AST: 17 U/L (ref 10–35)
Albumin: 4 g/dL (ref 3.6–5.1)
Alkaline phosphatase (APISO): 44 U/L (ref 40–115)
BUN / CREAT RATIO: 15 (calc) (ref 6–22)
BUN: 23 mg/dL (ref 7–25)
CO2: 25 mmol/L (ref 20–32)
CREATININE: 1.5 mg/dL — AB (ref 0.70–1.33)
Calcium: 9.3 mg/dL (ref 8.6–10.3)
Chloride: 106 mmol/L (ref 98–110)
GLUCOSE: 147 mg/dL — AB (ref 65–99)
Globulin: 2.7 g/dL (calc) (ref 1.9–3.7)
Potassium: 4.5 mmol/L (ref 3.5–5.3)
SODIUM: 141 mmol/L (ref 135–146)
TOTAL PROTEIN: 6.7 g/dL (ref 6.1–8.1)
Total Bilirubin: 0.5 mg/dL (ref 0.2–1.2)

## 2018-02-19 LAB — CBC WITH DIFFERENTIAL/PLATELET
Absolute Monocytes: 574 cells/uL (ref 200–950)
BASOS ABS: 20 {cells}/uL (ref 0–200)
Basophils Relative: 0.3 %
EOS PCT: 2.4 %
Eosinophils Absolute: 158 cells/uL (ref 15–500)
HEMATOCRIT: 40.4 % (ref 38.5–50.0)
Hemoglobin: 13.6 g/dL (ref 13.2–17.1)
LYMPHS ABS: 1855 {cells}/uL (ref 850–3900)
MCH: 28.8 pg (ref 27.0–33.0)
MCHC: 33.7 g/dL (ref 32.0–36.0)
MCV: 85.4 fL (ref 80.0–100.0)
MPV: 10.9 fL (ref 7.5–12.5)
Monocytes Relative: 8.7 %
NEUTROS PCT: 60.5 %
Neutro Abs: 3993 cells/uL (ref 1500–7800)
PLATELETS: 271 10*3/uL (ref 140–400)
RBC: 4.73 10*6/uL (ref 4.20–5.80)
RDW: 12.7 % (ref 11.0–15.0)
TOTAL LYMPHOCYTE: 28.1 %
WBC: 6.6 10*3/uL (ref 3.8–10.8)

## 2018-02-19 LAB — HEMOGLOBIN A1C
EAG (MMOL/L): 7.9 (calc)
HEMOGLOBIN A1C: 6.6 %{Hb} — AB (ref <5.7)
MEAN PLASMA GLUCOSE: 143 (calc)

## 2018-02-19 MED ORDER — INSULIN PEN NEEDLE 32G X 4 MM MISC: 1 refills | Status: DC

## 2018-02-20 ENCOUNTER — Other Ambulatory Visit: Payer: Self-pay | Admitting: *Deleted

## 2018-02-20 DIAGNOSIS — N179 Acute kidney failure, unspecified: Secondary | ICD-10-CM

## 2018-03-13 ENCOUNTER — Other Ambulatory Visit: Payer: Self-pay | Admitting: Family Medicine

## 2018-04-22 ENCOUNTER — Other Ambulatory Visit: Payer: Self-pay

## 2018-04-22 ENCOUNTER — Ambulatory Visit (INDEPENDENT_AMBULATORY_CARE_PROVIDER_SITE_OTHER): Payer: 59 | Admitting: Family Medicine

## 2018-04-22 ENCOUNTER — Encounter: Payer: Self-pay | Admitting: Family Medicine

## 2018-04-22 DIAGNOSIS — E119 Type 2 diabetes mellitus without complications: Secondary | ICD-10-CM | POA: Diagnosis not present

## 2018-04-22 DIAGNOSIS — Z6836 Body mass index (BMI) 36.0-36.9, adult: Secondary | ICD-10-CM

## 2018-04-22 DIAGNOSIS — I10 Essential (primary) hypertension: Secondary | ICD-10-CM | POA: Diagnosis not present

## 2018-04-22 MED ORDER — LOSARTAN POTASSIUM 100 MG PO TABS: 100.0000 mg | ORAL_TABLET | Freq: Every day | ORAL | 3 refills | Status: DC

## 2018-04-22 MED ORDER — SPIRONOLACTONE 50 MG PO TABS: ORAL_TABLET | ORAL | 3 refills | Status: DC

## 2018-04-22 MED ORDER — LIRAGLUTIDE 18 MG/3ML ~~LOC~~ SOPN: PEN_INJECTOR | SUBCUTANEOUS | 3 refills | Status: DC

## 2018-04-22 MED ORDER — TADALAFIL 20 MG PO TABS: ORAL_TABLET | ORAL | 3 refills | Status: DC

## 2018-04-22 MED ORDER — LABETALOL HCL 200 MG PO TABS: 200.0000 mg | ORAL_TABLET | Freq: Two times a day (BID) | ORAL | 3 refills | Status: DC

## 2018-04-22 MED ORDER — AMLODIPINE BESYLATE 10 MG PO TABS: 10.0000 mg | ORAL_TABLET | Freq: Every day | ORAL | 3 refills | Status: DC

## 2018-04-22 NOTE — Progress Notes (Signed)
Virtual Visit via Telephone Note  I connected with Brad [name] on 04/22/18 at  8:05 AM EDT by telephone and verified that I am speaking with the correct person using two identifiers.   Patient Location: Home   Physician Location: Dr. Milinda Antis, [facility]     I discussed the limitations, risks, security and privacy concerns of performing an evaluation and management service by telephone and the availability of in person appointments. I also discussed with the patient that there may be a patient responsible charge related to this service. The patient expressed understanding and agreed to proceed.   History of Present Illness:  Feels well, no specific concerns   DM- last A1C 6.6% in Jan 2020, started on Victoza , tolerating vicotzoa no N/V DIARRHEA, currently taking 1.8mg  daily as prescribed, he has not been checking his blood sugars   Creatinine was 1.50 up from baseline of 1.2, due for repeat labs   HTN history of MI -  He has not taken any BP readings at home, but does have a cuff  , taking losartan, aldactone, norvasc labetalol by cardiology   Obesity- currently on victoza for weight loss as well, last weight in Jan was  333lbs   Weight at home 326lbs     Needs-  refills on all meds       Assessment and Plan:  Lab visit in 4-5 weeks for BMET, A1C ,lipid ,if safe for pt to leave home in setting of COVID-19  DM- check CBG at least three times a week and record, goal fasting , 120  Obesity- continue Victoza, discussed home workouts he can do via computer/phone  discussed lowering carbs, more fruit/veggies/protein and water  Medications refilled to pharmacy    Follow Up Instructions: Per above plan for labs end of April     I discussed the assessment and treatment plan with the patient. The patient was provided an opportunity to ask questions and all were answered. The patient agreed with the plan and demonstrated an understanding of the  instructions.   The patient was advised to call back or seek an in-person evaluation if the symptoms worsen or if the condition fails to improve as anticipated.  I provided 13 minutes of non-face-to-face time during this encounter.   Milinda Antis, MD  Patient ID: Brad [name], male   DOB: 14-Jul-1960, 58 y.o.   MRN: [id number]

## 2018-05-10 ENCOUNTER — Other Ambulatory Visit: Payer: Self-pay | Admitting: Family Medicine

## 2018-05-12 NOTE — Telephone Encounter (Signed)
Ok to refill 

## 2018-07-29 LAB — HM DIABETES EYE EXAM

## 2018-08-06 ENCOUNTER — Encounter: Payer: Self-pay | Admitting: Neurology

## 2018-08-11 ENCOUNTER — Other Ambulatory Visit: Payer: Self-pay

## 2018-08-11 ENCOUNTER — Encounter: Payer: Self-pay | Admitting: Neurology

## 2018-08-11 ENCOUNTER — Ambulatory Visit (INDEPENDENT_AMBULATORY_CARE_PROVIDER_SITE_OTHER): Payer: 59 | Admitting: Neurology

## 2018-08-11 VITALS — BP 158/98 | HR 64 | Ht >= 80 in | Wt 320.0 lb

## 2018-08-11 DIAGNOSIS — Z9989 Dependence on other enabling machines and devices: Secondary | ICD-10-CM | POA: Diagnosis not present

## 2018-08-11 DIAGNOSIS — G4733 Obstructive sleep apnea (adult) (pediatric): Secondary | ICD-10-CM | POA: Diagnosis not present

## 2018-08-11 NOTE — Progress Notes (Signed)
Order for cpap supplies sent to [facility] via community message. Confirmation received that the order transmitted was successful.  

## 2018-08-11 NOTE — Progress Notes (Signed)
Subjective:    Patient ID: Brad [name] is a 58 y.o. male.  HPI     Interim history:   Brad [name] is a 58 year old right-handed gentleman with an underlying medical history of hypertension, prior lacunar strokes, recent hemorrhagic stroke in April 2016, recent smoking cessation, and obesity, who presents for follow up consultation of his mild to moderate obstructive sleep apnea, on CPAP therapy, for his yearly checkup. The patient is unaccompanied today. I last saw him on 08/08/2017, at which time he was compliant with his CPAP and doing well.  He was working on weight loss.  He was advised to follow-up in 1 year.   Today, 08/11/2018: I reviewed his CPAP compliance data from 07/08/2018 through 078 2020 which is a total of 30 days, during which time he used his machine 29 days with percent used days greater than 4 hours at 97%, indicating excellent compliance with an average usage of 7 hours and 21 minutes, residual AHI at goal at 0.3/h, leak acceptable with a 95th percentile at 13.4 L/min on a pressure of 7 cm with EPR of 1.  Set up date was 10/18/2014.  He reports doing well with his CPAP, he is compliant with treatment, missed today as he went to [place].  Otherwise, he is compliant with treatment and doing well.  He had no recent illness, typically is up-to-date with his supplies, uses a fullface mask.   The patient's allergies, current medications, family history, past medical history, past social history, past surgical history and problem list were reviewed and updated as appropriate.    Previously:  I reviewed his CPAP compliance data from 07/07/2017 through 08/05/2017 which is a total of 30 days, during which time he used his CPAP every night with percent used days greater than 4 hours at 97%, indicating excellent compliance with an average usage of 5 hours and 53 minutes, residual AHI at goal at 0.5 per hour, leak acceptable with the 95th percentile at 7.9 L/m on a pressure of 7 cm with EPR  of 1.  I saw him on 08/08/2016, at which time he was compliant with CPAP and reported ongoing good results. Working on weight loss as well.   I reviewed his CPAP compliance data from 07/08/2016 through 08/06/2016, which is a total of 30 days, during which time he used his CPAP every night with percent used days greater than 4 hours at 90%, indicating excellent compliance with an average usage of 5 hours and 3 minutes, residual AHI low at 0.5 per hour, leak acceptable with the 95th percentile at 14.6 L/m on a pressure of 7 cm with EPR of 1.    I saw him 02/09/2015, at which time he reported doing fairly well. He was tolerating CPAP and he was compliant with treatment. He indicated that he would wake up rested and daytime energy was better. He was working on weight loss and exercising regularly. We talked about is his baseline sleep study results from August 2016 and his CPAP titration study results from September 2016 in detail at the time. He was encouraged to continue with CPAP therapy regularly.   I reviewed his CPAP compliance data from 07/09/2015 through 08/07/2015 which is a total of 30 days during which time he used his machine 24 days with percent used days greater than 4 hours at 60%, indicating suboptimal compliance with an average usage for all nights of 3 hours and 3 minutes, residual AHI 1.1 per hour, leak acceptable with the 95th percentile  at 15.6 L/m on a pressure of 7 cm with EPR of 1.   I first met him on 07/20/2014 at the request of Dr. Jaynee Eagles, at which time the patient reported snoring, multiple nighttime awakenings, nocturia and excessive daytime somnolence. I invited him back for sleep study. He had a baseline sleep study, followed by a CPAP titration study. I went over his test results with him in detail today. His baseline sleep study from 09/03/2014 showed a sleep efficiency of 63.9% with a latency to sleep of 81.5 minutes, and wake after sleep onset of 78 minutes with mild to  moderate sleep fragmentation noted. He had an increased percentage of stage II sleep, absence of slow-wave sleep and REM sleep at 26.1% with a REM latency of 56 minutes. He had no significant PLMS, EKG or EEG changes. He had mild to moderate snoring. He did not have any supine sleep. Total AHI was 10 per hour, rising to 20.4 per hour during REM sleep. Average oxygen saturation was 96%, nadir was 90%. Given his sleep related complaints and his history of stroke, I invited him back for a full night CPAP titration study. He had this on 10/08/2014: Sleep efficiency was 73.6% with a latency of sleep of 13.5 minutes and wake after sleep onset of 87 minutes with mild sleep fragmentation noted. He had an increased percentage of stage 2 sleep, absence of slow wave sleep and increased REM sleep at 34.8% with a mildly reduced REM latency of 51.5 minutes. He had no significant PLMS, EKG or EEG changes. Average oxygen saturation was 94%, nadir was 90%. CPAP was titrated from 5 cm to 8 cm. AHI was 0 per hour on a pressure of 7 cm. Based on his test results I prescribed CPAP therapy for home use.   I reviewed his CPAP compliance data from 01/09/2015 through 02/07/2015 which is a total of 30 days during which time he used his machine 27 days with percent used days greater than 4 hours at 80%, indicating very good compliance with an average usage of 4 hours and 52 minutes, residual AHI low at 0.7 per hour, leak at times high with the 95th percentile at 28.7 L/m on a pressure of 7 cm with EPR of 1.   07/20/2014: He reports snoring, has multiple nighttime awakenings, has nocturia and excessive daytime somnolence. His ESS is 20/24. His snoring can be loud according to his wife, who provides most of his history. His bedtime varies. He may be in bed around 10 PM but typically does not fall asleep until late at night. He watches TV in bed. He may fall asleep around 2 AM. He goes to the bathroom multiple times each night, an average of  5 times per night. He denies morning headaches. He's currently not working. He works in Psychologist, educational at [employer]. He works 12 hour shifts, from 7 AM to 7 PM. He works in [place]. He lives in [place]. He denies restless leg symptoms. He's not known to twitch in his sleep according to his wife. He denies peripheral edema. He does not drink caffeine every day. He denies alcohol consumption. He quit smoking in April 2016. His rise time varies. He may be up by 5 AM.  His Past Medical History Is Significant For: Past Medical History:  Diagnosis Date  . Abnormal ultrasound of carotid artery 04/2014   no significant obstruction  . Ataxia 04/2014  . Diabetes ([place])   . EKG abnormalities 04/2014   poor R wave progression,  NSR  . Former smoker    25 years x 1.5 ppd, stopped 04/2014  . Gait disturbance 04/2014   s/p stroke  . H/O echocardiogram 04/2014   moderate LVH, 60-65% EF, no valve disease  . History of heart attack   . Hypertensive heart disease 04/2014  . Intraparenchymal hemorrhage of brain [facility]) 05/05/14   hospitalization [facility]  . Short-term memory loss 04/2014  . Stroke [facility]) 04/2014    His Past Surgical History Is Significant For: Past Surgical History:  Procedure Laterality Date  . APPENDECTOMY    . HEMORRHOID SURGERY      His Family History Is Significant For: Family History  Problem Relation Age of Onset  . Heart disease Mother   . Hypertension Mother   . Hypertension Brother   . Aneurysm Paternal Grandmother     His Social History Is Significant For: Social History   Socioeconomic History  . Marital status: Married    Spouse name: [employer]  . Number of children: 2  . Years of education: 57  . Highest education level: Not on file  Occupational History  . Not on file  Social Needs  . Financial resource strain: Not on file  . Food insecurity    Worry: Not on file    Inability: Not on file  . Transportation needs    Medical: Not on file     Non-medical: Not on file  Tobacco Use  . Smoking status: Former Smoker    Packs/day: 1.50    Years: 25.00    Pack years: 43.50    Quit date: 05/05/2014    Years since quitting: 4.2  . Smokeless tobacco: Never Used  Substance and Sexual Activity  . Alcohol use: No    Alcohol/week: 0.0 standard drinks  . Drug use: No  . Sexual activity: Not on file  Lifestyle  . Physical activity    Days per week: Not on file    Minutes per session: Not on file  . Stress: Not on file  Relationships  . Social Herbalist on phone: Not on file    Gets together: Not on file    Attends religious service: Not on file    Active member of club or organization: Not on file    Attends meetings of clubs or organizations: Not on file    Relationship status: Not on file  Other Topics Concern  . Not on file  Social History Narrative   Lives at home with wife   Married to [employer], non denominational, works at [employer] in [place], [place].  From [place], but moved to [place] 2016.     Caffeine use: Drinks tea occass    His Allergies Are:  No Known Allergies:   His Current Medications Are:  Outpatient Encounter Medications as of 08/11/2018  Medication Sig  . amLODipine (NORVASC) 10 MG tablet Take 1 tablet (10 mg total) by mouth daily.  . blood glucose meter kit and supplies KIT Dispense based on patient and insurance preference. Check fasting blood sugar once daily ICD10 R73.03  . Blood Glucose Monitoring Suppl (BLOOD GLUCOSE SYSTEM PAK) KIT Dispense per patient and insurance preference. Use as directed to monitor FSBS 1x daily. Dx: R73.03.  . glucose blood test strip Use as instructed to monitor FSBS 1x daily. Dx: R73.09  . Insulin Pen Needle 32G X 4 MM MISC Use as directed to inject Victoza SQ QD.  Marland Kitchen labetalol (NORMODYNE) 200 MG tablet Take  1 tablet (200 mg total) by mouth 2 (two) times daily.  . Lancets (ONETOUCH ULTRASOFT) lancets Use as instructed  . liraglutide (VICTOZA) 18 MG/3ML  SOPN Inject 1.'8mg'$  once a day  . losartan (COZAAR) 100 MG tablet Take 1 tablet (100 mg total) by mouth daily.  Marland Kitchen spironolactone (ALDACTONE) 50 MG tablet TAKE 1 TABLET(50 MG) BY MOUTH DAILY  . [DISCONTINUED] tadalafil (ADCIRCA/CIALIS) 20 MG tablet TAKE 1 TABLET(20 MG) BY MOUTH DAILY AS NEEDED FOR ERECTILE DYSFUNCTION   No facility-administered encounter medications on file as of 08/11/2018.   :  Review of Systems:  Out of a complete 14 point review of systems, all are reviewed and negative with the exception of these symptoms as listed below: Review of Systems  Neurological:       Pt presents today to discuss his cpap. Pt reports that his cpap is going well.    Objective:  Neurological Exam  Physical Exam Physical Examination:   Vitals:   08/11/18 1041  BP: (!) 158/98  Pulse: 64    General Examination: The patient is a very pleasant 58 y.o. male in no acute distress. He appears well-developed and well-nourished and well groomed.   HEENT:Normocephalic, atraumatic, pupils are equal, round and reactive to light and accommodation. Extraocular tracking is good without limitation to gaze excursion or nystagmus noted. Normal smooth pursuit is noted. Hearing is grossly intact. Face is symmetric with normal facial animation, speech isclear,no hypophonia. There is no lip, neck/head, jaw or voice tremor. Oropharynx exam reveals:stable findings,marked airway crowding. Tongue protrudes centrally and palate elevates symmetrically. No carotid bruits.  Chest:Clear to auscultation without wheezing, rhonchi or crackles noted.  Heart:S1+S2+0, regular and normal without murmurs, rubs or gallops noted.   Abdomen:Soft, non-tender and non-distended with normal bowel sounds appreciated on auscultation.  Extremities:Thereare no new findings, no edema.  Skin: Warm and dry without trophic changes noted.   Musculoskeletal: exam reveals no obvious joint deformities, tenderness or joint  swelling or erythema.   Neurologically:  Mental status: The patient is awake, alert and oriented in all 4 spheres. His immediate and remote memory, attention, language skills and fund of knowledge are appropriate. There is no evidence of aphasia, agnosia, apraxia or anomia. Speech is clear with normal prosody and enunciation. Thought process is linear. Mood and affectarenormal.  Cranial nerves II - XII are as described above under HEENT exam. Motor exam: Normal bulk, strength and tone is noted. There is no tremor or rebound. Romberg is negative except for minimal sway. Fine motor skills are grossly intact.  Sensory exam: intact to light touch.  Gait, station and balance: He stands without difficulty. Posture is age-appropriate, has a slight lean to the left and stance is narrow based, toes point out b/l. Gait shows normal stride length and normal pace. No problems turning are noted.   Assessment and Plan:   In summary, Brad [name] is a very pleasant 58 year old male with an underlying medical history ofhypertension, hyperlipidemia, lacunar strokes, hemorrhagic stroke in April 2016, and obesity, whopresents for follow-up consultation of his obstructive sleep apnea,well established onCPAP therapy at 7 cm with ongoing good results reported and excellent compliance. He had a baseline sleep study in August 2016, CPAP titration study in September 2016 and has since then establish treatment at home. He Has been working on weight loss, currently not able to go to the gym of course.  He is advised to continue to work on weight loss and try to walk in the neighborhood if  possible.  He is up-to-date with supplies typically.  I renewed his prescription which we will send to his DME company.  He is commended for his treatment adherence with his CPAP and advised to follow-up routinely in 1 year, he may see the nurse practitioner next time.  I answered all his questions today and he was in  agreement. I spent 20 minutes in total face-to-face time with the patient, more than 50% of which was spent in counseling and coordination of care, reviewing test results, reviewing medication and discussing or reviewing the diagnosis of OSA, its prognosis and treatment options. Pertinent laboratory and imaging test results that were available during this visit with the patient were reviewed by me and considered in my medical decision making (see chart for details).

## 2018-08-11 NOTE — Patient Instructions (Signed)
You have continued to do well with your CPAP, Keep up the good work! I will see you back in one year, you may see the nurse Practitioner if you like as you have been stable and doing well.

## 2018-11-11 ENCOUNTER — Other Ambulatory Visit: Payer: Self-pay

## 2018-11-11 ENCOUNTER — Ambulatory Visit: Payer: 59 | Admitting: Cardiovascular Disease

## 2018-11-11 ENCOUNTER — Encounter: Payer: Self-pay | Admitting: Cardiovascular Disease

## 2018-11-11 VITALS — BP 140/92 | HR 68 | Ht >= 80 in | Wt 332.4 lb

## 2018-11-11 DIAGNOSIS — Z6836 Body mass index (BMI) 36.0-36.9, adult: Secondary | ICD-10-CM

## 2018-11-11 DIAGNOSIS — I10 Essential (primary) hypertension: Secondary | ICD-10-CM | POA: Diagnosis not present

## 2018-11-11 DIAGNOSIS — I214 Non-ST elevation (NSTEMI) myocardial infarction: Secondary | ICD-10-CM

## 2018-11-11 LAB — BASIC METABOLIC PANEL
BUN/Creatinine Ratio: 12 (ref 9–20)
BUN: 13 mg/dL (ref 6–24)
CO2: 24 mmol/L (ref 20–29)
Calcium: 9.1 mg/dL (ref 8.7–10.2)
Chloride: 101 mmol/L (ref 96–106)
Creatinine, Ser: 1.11 mg/dL (ref 0.76–1.27)
GFR calc Af Amer: 85 mL/min/{1.73_m2} (ref >59)
GFR calc non Af Amer: 74 mL/min/{1.73_m2} (ref >59)
Glucose: 225 mg/dL — ABNORMAL HIGH (ref 65–99)
Potassium: 4.6 mmol/L (ref 3.5–5.2)
Sodium: 137 mmol/L (ref 134–144)

## 2018-11-11 LAB — HEPATIC FUNCTION PANEL
ALT: 21 IU/L (ref 0–44)
AST: 13 IU/L (ref 0–40)
Albumin: 4.3 g/dL (ref 3.8–4.9)
Alkaline Phosphatase: 57 IU/L (ref 39–117)
Bilirubin Total: 0.7 mg/dL (ref 0.0–1.2)
Bilirubin, Direct: 0.21 mg/dL (ref 0.00–0.40)
Total Protein: 6.5 g/dL (ref 6.0–8.5)

## 2018-11-11 LAB — LIPID PANEL
Chol/HDL Ratio: 2.6 ratio (ref 0.0–5.0)
Cholesterol, Total: 110 mg/dL (ref 100–199)
HDL: 42 mg/dL (ref >39)
LDL Chol Calc (NIH): 57 mg/dL (ref 0–99)
Triglycerides: 46 mg/dL (ref 0–149)
VLDL Cholesterol Cal: 11 mg/dL (ref 5–40)

## 2018-11-11 MED ORDER — AMLODIPINE BESYLATE 10 MG PO TABS: 10.0000 mg | ORAL_TABLET | Freq: Every day | ORAL | 3 refills | Status: DC

## 2018-11-11 MED ORDER — LOSARTAN POTASSIUM 100 MG PO TABS: 100.0000 mg | ORAL_TABLET | Freq: Every day | ORAL | 3 refills | Status: DC

## 2018-11-11 MED ORDER — SPIRONOLACTONE 50 MG PO TABS: ORAL_TABLET | ORAL | 3 refills | Status: DC

## 2018-11-11 NOTE — Progress Notes (Signed)
Cardiology Office Note   Date:  11/11/2018   ID:  Brad [name], DOB 12-May-1960, MRN [id number]  PCP:  Alycia Rossetti, MD  Cardiologist:   Mertie Moores, MD   No chief complaint on file.  Problem list: 1. 1. Malignant hypertension - Echo from the past  shows normal left ventricular  systolic function with EF of 60-65% with moderate LVH 2. Intracranial hemorrhage caused by malignant hypertension 3. Non-ST segment elevation myocardial infarction-in the setting of malignant hypertension,  Echo shows normal LF function with EF of 60-65% with no segmental wall abn.     Brad [name] is a 58 y.o. male who presents for follow up for malignant hypertension and a type 2 non-ST segment myocardial infarction.  He's been doing fairly well from a cardiac point. He's not had any episodes of chest discomfort. He's currently in rehabilitation for intracranial hemorrhage. His blood pressure is much better controlled.  He has lost quite a bit of weight.   Oct. 26, 2016:  No Cp or dyspnea.  BP is fairly well controlled.   May 23, 2015:  Brad [name] is doing well.  BP is well controlled. Is recovering from his CVA  Walking    OCt. 23, 2017:   No CP or dyspnea.  Echo  [facility] [place], May 06, 2014 - Normal left ventricle systolic function - ejection fraction 60-65%. He has concentric left ventricular hypertrophy.  Works out every day   Jan. 23, 2018:  Feeling well.   Works out every day . Goes to the gym every day  Weight today is 329.  Sept. 13, 2018 Wt = 332 Brad [name] is seen today  BP is well controlled.  No CP or dyspnea  Had stopped the Aldactone,  Is back on it now Is ready to go on a diet - HbA1C is up  Labs from 9/11 look good  October 11, 2017: Has been working out regularly  Had lost some weight but has gained some back   November 11, 2018: Brad [name] is seen today for follow-up visit.  He has a history of hypertension.  Has had an intracranial bleed and a NSTEMI .  Wants to  see a nutritionist.  Just getting back to the gym .  Has been walking   Past Medical History:  Diagnosis Date  . Abnormal ultrasound of carotid artery 04/2014   no significant obstruction  . Ataxia 04/2014  . Diabetes ([place])   . EKG abnormalities 04/2014   poor R wave progression, NSR  . Former smoker    25 years x 1.5 ppd, stopped 04/2014  . Gait disturbance 04/2014   s/p stroke  . H/O echocardiogram 04/2014   moderate LVH, 60-65% EF, no valve disease  . History of heart attack   . Hypertensive heart disease 04/2014  . Intraparenchymal hemorrhage of brain [facility]) 05/05/14   hospitalization [facility]  . Short-term memory loss 04/2014  . Stroke [facility]) 04/2014    Past Surgical History:  Procedure Laterality Date  . APPENDECTOMY    . HEMORRHOID SURGERY       Current Outpatient Medications  Medication Sig Dispense Refill  . amLODipine (NORVASC) 10 MG tablet Take 1 tablet (10 mg total) by mouth daily. 90 tablet 3  . blood glucose meter kit and supplies KIT Dispense based on patient and insurance preference. Check fasting blood sugar once daily ICD10 R73.03 1 each 0  . Blood Glucose Monitoring Suppl (BLOOD GLUCOSE SYSTEM PAK) KIT Dispense per patient and  insurance preference. Use as directed to monitor FSBS 1x daily. Dx: R73.03. 1 each 1  . glucose blood test strip Use as instructed to monitor FSBS 1x daily. Dx: R73.09 100 each 12  . Insulin Pen Needle 32G X 4 MM MISC Use as directed to inject Victoza SQ QD. 100 each 1  . labetalol (NORMODYNE) 200 MG tablet Take 1 tablet (200 mg total) by mouth 2 (two) times daily. 180 tablet 3  . Lancets (ONETOUCH ULTRASOFT) lancets Use as instructed 100 each 12  . liraglutide (VICTOZA) 18 MG/3ML SOPN Inject 1.'8mg'$  once a day 3 pen 3  . losartan (COZAAR) 100 MG tablet Take 1 tablet (100 mg total) by mouth daily. 90 tablet 3  . spironolactone (ALDACTONE) 50 MG tablet TAKE 1 TABLET(50 MG) BY MOUTH DAILY 90 tablet 3  . tadalafil (CIALIS) 20 MG  tablet Take 1 tablet by mouth as needed.     No current facility-administered medications for this visit.     Allergies:   Patient has no known allergies.    Social History:  The patient  reports that he quit smoking about 4 years ago. He has a 37.50 pack-year smoking history. He has never used smokeless tobacco. He reports that he does not drink alcohol or use drugs.   Family History:  The patient's family history includes Aneurysm in his paternal grandmother; Heart disease in his mother; Hypertension in his brother and mother.    ROS:  Please see the history of present illness.    Physical Exam: Blood pressure (!) 140/92, pulse 68, height '6\' 9"'$  (2.057 m), weight (!) 332 lb 6.4 oz (150.8 kg), SpO2 97 %.  GEN:  Middle age male, Well nourished, well developed in no acute distress HEENT: Normal NECK: No JVD; No carotid bruits LYMPHATICS: No lymphadenopathy CARDIAC: RRR , no murmurs, rubs, gallops RESPIRATORY:  Clear to auscultation without rales, wheezing or rhonchi  ABDOMEN: Soft, non-tender, non-distended MUSCULOSKELETAL:  No edema; No deformity  SKIN: Warm and dry NEUROLOGIC:  Alert and oriented x 3   EKG:   November 11, 2018: Normal sinus rhythm at 68 beats a minute.  Occasional premature ventricular contractions.  Otherwise normal EKG.   Recent Labs: 02/18/2018: ALT 21; BUN 23; Creat 1.50; Hemoglobin 13.6; Platelets 271; Potassium 4.5; Sodium 141    Lipid Panel    Component Value Date/Time   CHOL 116 06/07/2017 0926   CHOL 84 (L) 10/01/2014 0001   TRIG 47 06/07/2017 0926   TRIG 51 10/01/2014 0001   HDL 42 06/07/2017 0926   HDL 40 10/01/2014 0001   CHOLHDL 2.8 06/07/2017 0926   VLDL 9 11/21/2015 1124   LDLCALC 61 06/07/2017 0926   LDLCALC 34 10/01/2014 0001      Wt Readings from Last 3 Encounters:  11/11/18 (!) 332 lb 6.4 oz (150.8 kg)  08/11/18 (!) 320 lb (145.2 kg)  02/18/18 (!) 333 lb (151 kg)      Other studies Reviewed: Additional studies/ records  that were reviewed today include: . Review of the above records demonstrates:    ASSESSMENT AND PLAN:  1.  Malignant hypertension -blood pressures fairly well controlled.  The diastolic pressure still slightly high.  He still admits to eating some salty foods.  Encouraged him to get rid of the salty foods.  We will continue the same medications.  I will see him again in 1 year.  2. Intracranial hemorrhage   -no evidence of any further intracranial hemorrhage.  3. Non-ST segment elevation  myocardial infarction-i       He denies any chest pain or shortness of breath.  He is been exercising without any difficulties. I will see him again in 1 year.  Current medicines are reviewed at length with the patient today.  The patient does not have concerns regarding medicines.  The following changes have been made:  no change  Labs/ tests ordered today include:  No orders of the defined types were placed in this encounter.   Disposition:   FU with me in 1 year    Mertie Moores, MD  11/11/2018 10:13 AM    [address], [place], [place]  [address] Phone: [phone]; Fax: [phone]

## 2018-11-11 NOTE — Patient Instructions (Signed)
Medication Instructions:   If you need a refill on your cardiac medications before your next appointment, please call your pharmacy.   Lab work: Your physician recommends that you have lab work today- BMET, Lipid and Liver panel.   If you have labs (blood work) drawn today and your tests are completely normal, you will receive your results only by: Marland Kitchen MyChart Message (if you have MyChart) OR . A paper copy in the mail If you have any lab test that is abnormal or we need to change your treatment, we will call you to review the results.  Testing/Procedures: None ordered today.  Follow-Up: At [facility], you and your health needs are our priority.  As part of our continuing mission to provide you with exceptional heart care, we have created designated Provider Care Teams.  These Care Teams include your primary Cardiologist (physician) and Advanced Practice Providers (APPs -  Physician Assistants and Nurse Practitioners) who all work together to provide you with the care you need, when you need it. You will need a follow up appointment in:  1 years.  Please call our office 2 months in advance to schedule this appointment.  You may see Dr. Acie Fredrickson or one of the following Advanced Practice Providers on your designated Care Team: Richardson Dopp, PA-C [place], [place] . Daune Perch, NP   You have been referred to [facility].

## 2018-11-25 ENCOUNTER — Encounter: Payer: Self-pay | Admitting: Family Medicine

## 2018-11-25 ENCOUNTER — Ambulatory Visit: Payer: 59 | Admitting: Family Medicine

## 2018-11-25 ENCOUNTER — Other Ambulatory Visit: Payer: Self-pay

## 2018-11-25 VITALS — BP 132/78 | HR 86 | Temp 98.6°F | Resp 14 | Ht >= 80 in | Wt 327.0 lb

## 2018-11-25 DIAGNOSIS — Z8673 Personal history of transient ischemic attack (TIA), and cerebral infarction without residual deficits: Secondary | ICD-10-CM

## 2018-11-25 DIAGNOSIS — I10 Essential (primary) hypertension: Secondary | ICD-10-CM | POA: Diagnosis not present

## 2018-11-25 DIAGNOSIS — Z6835 Body mass index (BMI) 35.0-35.9, adult: Secondary | ICD-10-CM

## 2018-11-25 DIAGNOSIS — E119 Type 2 diabetes mellitus without complications: Secondary | ICD-10-CM

## 2018-11-25 DIAGNOSIS — N528 Other male erectile dysfunction: Secondary | ICD-10-CM

## 2018-11-25 MED ORDER — TADALAFIL 20 MG PO TABS: 20.0000 mg | ORAL_TABLET | ORAL | 2 refills | Status: DC | PRN

## 2018-11-25 MED ORDER — VICTOZA 18 MG/3ML ~~LOC~~ SOPN: PEN_INJECTOR | SUBCUTANEOUS | 3 refills | Status: DC

## 2018-11-25 NOTE — Assessment & Plan Note (Signed)
Diabetes recheck A1C goal is less than 7%  Continue victoza  Lipids at goal

## 2018-11-25 NOTE — Patient Instructions (Addendum)
F/U 4 months for Physical  

## 2018-11-25 NOTE — Assessment & Plan Note (Signed)
Controlled no changes 

## 2018-11-25 NOTE — Assessment & Plan Note (Signed)
No deficits, try ing to control risk factors to prevent recurrence

## 2018-11-25 NOTE — Progress Notes (Signed)
   Subjective:    Patient ID: Brad [name], male    DOB: 02/13/1960, 58 y.o.   MRN: [id number]  Patient presents for Follow-up (DM- is fasting)  Pt here to f/u DM , last A1C 6.6% in Jan   Had labs with cardiology glucose was  225 He admits to indulging the past few months.  He does not have any particular concerns.  His lipid panel is at goal his liver function and renal function were normal.  He is taking his Victoza 1.8 mg as prescribed.  He still walking for exercise at the park   needs cialis refilled   Review Of Systems:  GEN- denies fatigue, fever, weight loss,weakness, recent illness HEENT- denies eye drainage, change in vision, nasal discharge, CVS- denies chest pain, palpitations RESP- denies SOB, cough, wheeze ABD- denies N/V, change in stools, abd pain GU- denies dysuria, hematuria, dribbling, incontinence MSK- denies joint pain, muscle aches, injury Neuro- denies headache, dizziness, syncope, seizure activity       Objective:    BP 132/78   Pulse 86   Temp 98.6 F (37 C) (Oral)   Resp 14   Ht 6\' 9"  (2.057 m)   Wt (!) 327 lb (148.3 kg)   SpO2 98%   BMI 35.04 kg/m  GEN- NAD, alert and oriented x3 HEENT- PERRL, EOMI, non injected sclera, pink conjunctiva, MMM, oropharynx clear Neck- Supple, no thyromegaly CVS- RRR, no murmur RESP-CTAB ABD-NABS,soft,NT,ND EXT- No edema Pulses- Radial, DP- 2+        Assessment & Plan:     obtain last eye note Problem List Items Addressed This Visit      Unprioritized   Diabetes mellitus without complication ([place])    Diabetes recheck A1C goal is less than 7%  Continue victoza  Lipids at goal      Relevant Medications   liraglutide (VICTOZA) 18 MG/3ML SOPN   Other Relevant Orders   Hemoglobin A1c   HM DIABETES FOOT EXAM (Completed)   Essential hypertension - Primary    Controlled no changes       Relevant Medications   tadalafil (CIALIS) 20 MG tablet   Other Relevant Orders   CBC with Differential/Platelet    History of stroke    No deficits, try ing to control risk factors to prevent recurrence       Obesity   Relevant Medications   liraglutide (VICTOZA) 18 MG/3ML SOPN   Other male erectile dysfunction    Refilled cialis at request         Note: This dictation was prepared with Dragon dictation along with smaller phrase technology. Any transcriptional errors that result from this process are unintentional.

## 2018-11-25 NOTE — Assessment & Plan Note (Signed)
Refilled cialis at request

## 2018-11-26 LAB — CBC WITH DIFFERENTIAL/PLATELET
Absolute Monocytes: 583 cells/uL (ref 200–950)
Basophils Absolute: 27 cells/uL (ref 0–200)
Basophils Relative: 0.4 %
Eosinophils Absolute: 127 cells/uL (ref 15–500)
Eosinophils Relative: 1.9 %
HCT: 39.9 % (ref 38.5–50.0)
Hemoglobin: 13.4 g/dL (ref 13.2–17.1)
Lymphs Abs: 2124 cells/uL (ref 850–3900)
MCH: 29 pg (ref 27.0–33.0)
MCHC: 33.6 g/dL (ref 32.0–36.0)
MCV: 86.4 fL (ref 80.0–100.0)
MPV: 10.5 fL (ref 7.5–12.5)
Monocytes Relative: 8.7 %
Neutro Abs: 3839 cells/uL (ref 1500–7800)
Neutrophils Relative %: 57.3 %
Platelets: 299 10*3/uL (ref 140–400)
RBC: 4.62 10*6/uL (ref 4.20–5.80)
RDW: 12.9 % (ref 11.0–15.0)
Total Lymphocyte: 31.7 %
WBC: 6.7 10*3/uL (ref 3.8–10.8)

## 2018-11-26 LAB — HEMOGLOBIN A1C
Hgb A1c MFr Bld: 9.5 % of total Hgb — ABNORMAL HIGH (ref <5.7)
Mean Plasma Glucose: 226 (calc)
eAG (mmol/L): 12.5 (calc)

## 2018-11-27 ENCOUNTER — Other Ambulatory Visit: Payer: Self-pay | Admitting: *Deleted

## 2018-11-27 MED ORDER — METFORMIN HCL 500 MG PO TABS: 500.0000 mg | ORAL_TABLET | Freq: Two times a day (BID) | ORAL | 3 refills | Status: DC

## 2018-12-05 ENCOUNTER — Encounter: Payer: Self-pay | Admitting: *Deleted

## 2019-03-26 ENCOUNTER — Encounter (INDEPENDENT_AMBULATORY_CARE_PROVIDER_SITE_OTHER): Payer: Self-pay

## 2019-04-07 ENCOUNTER — Encounter (INDEPENDENT_AMBULATORY_CARE_PROVIDER_SITE_OTHER): Payer: Self-pay | Admitting: Family Medicine

## 2019-04-07 ENCOUNTER — Other Ambulatory Visit: Payer: Self-pay

## 2019-04-07 ENCOUNTER — Ambulatory Visit (INDEPENDENT_AMBULATORY_CARE_PROVIDER_SITE_OTHER): Payer: 59 | Admitting: Family Medicine

## 2019-04-07 VITALS — BP 146/89 | HR 64 | Temp 97.8°F | Ht 79.0 in | Wt 334.0 lb

## 2019-04-07 DIAGNOSIS — E119 Type 2 diabetes mellitus without complications: Secondary | ICD-10-CM | POA: Diagnosis not present

## 2019-04-07 DIAGNOSIS — R0602 Shortness of breath: Secondary | ICD-10-CM | POA: Diagnosis not present

## 2019-04-07 DIAGNOSIS — Z9189 Other specified personal risk factors, not elsewhere classified: Secondary | ICD-10-CM | POA: Diagnosis not present

## 2019-04-07 DIAGNOSIS — G4733 Obstructive sleep apnea (adult) (pediatric): Secondary | ICD-10-CM

## 2019-04-07 DIAGNOSIS — Z6837 Body mass index (BMI) 37.0-37.9, adult: Secondary | ICD-10-CM

## 2019-04-07 DIAGNOSIS — R5383 Other fatigue: Secondary | ICD-10-CM

## 2019-04-07 DIAGNOSIS — E1159 Type 2 diabetes mellitus with other circulatory complications: Secondary | ICD-10-CM

## 2019-04-07 DIAGNOSIS — Z1331 Encounter for screening for depression: Secondary | ICD-10-CM | POA: Diagnosis not present

## 2019-04-07 DIAGNOSIS — I10 Essential (primary) hypertension: Secondary | ICD-10-CM

## 2019-04-07 DIAGNOSIS — Z0289 Encounter for other administrative examinations: Secondary | ICD-10-CM

## 2019-04-07 NOTE — Progress Notes (Signed)
Dear Dr. Cathie Olden,   Thank you for referring Brad [name] to our clinic. The following note includes my evaluation and treatment recommendations.  Chief Complaint:   OBESITY Brad [name] (MR# [id number]) is a 59 y.o. male who presents for evaluation and treatment of obesity and related comorbidities. Current BMI is Body mass index is 37.63 kg/m. [name] has been struggling with his weight for many years and has been unsuccessful in either losing weight, maintaining weight loss, or reaching his healthy weight goal.  Brad [name] is currently in the action stage of change and ready to dedicate time achieving and maintaining a healthier weight. Brad [name] is interested in becoming our patient and working on intensive lifestyle modifications including (but not limited to) diet and exercise for weight loss.  Brad [name] is exercising everyday at the gym or the park.  He walks 3-4 miles a day.  Brad [name]'s habits were reviewed today and are as follows: His family sometimes eats meals together, his desired weight loss is 46 pounds, he started gaining weight about 20 years ago, his heaviest weight ever was his current weight of 334 pounds, he craves fried foods, he snacks frequently in the evenings, he wakes up sometimes in the middle of the night to eat, he sometimes skips meals and says he does not eat on a schedule, he is frequently drinking liquids with calories, he frequently makes poor food choices, he sometimes eats larger portions than normal and he struggles with emotional eating.  Depression Screen Brad [name]'s Food and Mood (modified PHQ-9) score was 6.  Depression screen PHQ 2/9 04/07/2019  Decreased Interest 1  Down, Depressed, Hopeless 1  PHQ - 2 Score 2  Altered sleeping 1  Tired, decreased energy 1  Change in appetite 0  Feeling bad or failure about yourself  0  Trouble concentrating 2  Moving slowly or fidgety/restless 0  Suicidal thoughts 0  PHQ-9 Score 6  Difficult doing work/chores Not  difficult at all   Subjective:   1. Other fatigue Brad [name] admits to daytime somnolence and admits to waking up still tired. Patent has a history of symptoms of daytime fatigue, morning fatigue and snoring. Brad [name] generally gets 6 hours of sleep per night, and states that he has poor sleep quality. Snoring is present. Apneic episodes are not present. Epworth Sleepiness Score is 10.  2. SOB (shortness of breath) on exertion Brad [name] notes increasing shortness of breath with exercising and seems to be worsening over time with weight gain. He notes getting out of breath sooner with activity than he used to. This has not gotten worse recently. Brad [name] denies shortness of breath at rest or orthopnea.  3. Type 2 diabetes mellitus without complication, without long-term current use of insulin (HCC) Medications reviewed. He is taking Victoza 1.8 mg.  He was prescribed metformin, but never started taking it.  Lab Results  Component Value Date   HGBA1C 9.5 (H) 11/25/2018   HGBA1C 6.6 (H) 02/18/2018   HGBA1C 6.0 (H) 06/07/2017   Lab Results  Component Value Date   MICROALBUR 0.3 02/06/2017   LDLCALC 57 11/11/2018   CREATININE 1.11 11/11/2018   4. Hypertension associated with diabetes ([name]) Review: taking medications as instructed, no medication side effects noted, no chest pain on exertion, no dyspnea on exertion, no swelling of ankles.  [name] is taking Norvasc, labetalol, losartan, and spironolactone.  BP Readings from Last 3 Encounters:  04/07/19 (!) 146/89  11/25/18 132/78  11/11/18 (!) 140/92   5. OSA (obstructive sleep apnea)  Myrl has a diagnosis of sleep apnea. He reports that he is using a CPAP regularly.   Situation Chance of Dozing or Sleeping  Sitting and reading 2 = moderate chance of dozing or sleeping  Watching television 3 = high chance of dozing or sleeping  Sitting inactive in a public place (theater or meeting) 1 = slight chance of dozing or sleeping  Lying down in the  afternoon when circumstances permit 1 = slight chance of dozing or sleeping  Sitting as a passenger in a car for an hour 1 = slight chance of dozing or sleeping  Sitting and talking to someone 1 = slight chance of dozing or sleeping  Sitting quietly after lunch without alcohol 1 = slight chance of dozing or sleeping  In a car, while stopped for a few minutes in traffic 0 = would never doze or sleep  TOTAL 10   Assessment/Plan:   1. Other fatigue Jasdeep does not feel that his weight is causing his energy to be lower than it should be. Fatigue may be related to obesity, depression or many other causes. Labs will be ordered, and in the meanwhile, [name] will focus on self care including making healthy food choices, increasing physical activity and focusing on stress reduction.  Orders - EKG 12-Lead - Lipid panel - VITAMIN D 25 Hydroxy (Vit-D Deficiency, Fractures) - Vitamin B12 - TSH - T4, free - T3  2. SOB (shortness of breath) on exertion Brad [name] does not feel that he gets out of breath more easily that he used to when he exercises. Brad [name]'s shortness of breath appears to be obesity related and exercise induced. He has agreed to work on weight loss and gradually increase exercise to treat his exercise induced shortness of breath. Will continue to monitor closely.  3. Type 2 diabetes mellitus without complication, without long-term current use of insulin (HCC) Good blood sugar control is important to decrease the likelihood of diabetic complications such as nephropathy, neuropathy, limb loss, blindness, coronary artery disease, and death. Intensive lifestyle modification including diet, exercise and weight loss are the first line of treatment for diabetes.   Orders - Comprehensive metabolic panel - Hemoglobin A1c  4. Hypertension associated with diabetes (HCC) Brad [name] is working on healthy weight loss and exercise to improve blood pressure control. We will watch for signs of hypotension  as he continues his lifestyle modifications.  Orders - CBC with Differential/Platelet  5. OSA (obstructive sleep apnea) Intensive lifestyle modifications are the first line treatment for this issue. We discussed several lifestyle modifications today and he will continue to work on diet, exercise and weight loss efforts. We will continue to monitor. Orders and follow up as documented in patient record.   Counseling  Sleep apnea is a condition in which breathing pauses or becomes shallow during sleep. This happens over and over during the night. This disrupts your sleep and keeps your body from getting the rest that it needs, which can cause tiredness and lack of energy (fatigue) during the day.  Sleep apnea treatment: If you were given a device to open your airway while you sleep, USE IT!  Sleep hygiene:   Limit or avoid alcohol, caffeinated beverages, and cigarettes, especially close to bedtime.   Do not eat a large meal or eat spicy foods right before bedtime. This can lead to digestive discomfort that can make it hard for you to sleep.  Keep a sleep diary to help you and your health care provider figure out what  could be causing your insomnia.  . Make your bedroom a dark, comfortable place where it is easy to fall asleep. ? Put up shades or blackout curtains to block light from outside. ? Use a Shen noise machine to block noise. ? Keep the temperature cool. . Limit screen use before bedtime. This includes: ? Watching TV. ? Using your smartphone, tablet, or computer. . Stick to a routine that includes going to bed and waking up at the same times every day and night. This can help you fall asleep faster. Consider making a quiet activity, such as reading, part of your nighttime routine. . Try to avoid taking naps during the day so that you sleep better at night. . Get out of bed if you are still awake after 15 minutes of trying to sleep. Keep the lights down, but try reading or doing a  quiet activity. When you feel sleepy, go back to bed.  6. Depression screening Dvante had a positive depression screening. Depression is commonly associated with obesity and often results in emotional eating behaviors. We will monitor this closely and work on CBT to help improve the non-hunger eating patterns. Referral to Psychology may be required if no improvement is seen as he continues in our clinic.  7. At risk for heart disease Kaedan was given approximately 15 minutes of coronary artery disease prevention counseling today. He is 59 y.o. male and has risk factors for heart disease including obesity. We discussed intensive lifestyle modifications today with an emphasis on specific weight loss instructions and strategies.   Repetitive spaced learning was employed today to elicit superior memory formation and behavioral change.  8. Class 2 severe obesity with serious comorbidity and body mass index (BMI) of 37.0 to 37.9 in adult, unspecified obesity type (HCC) Kenzo is currently in the action stage of change and his goal is to continue with weight loss efforts. I recommend Tiwan begin the structured treatment plan as follows:  He has agreed to the Category 3 Plan.  Exercise goals: As is.   Behavioral modification strategies: increasing lean protein intake, decreasing simple carbohydrates, increasing vegetables, increasing water intake and decreasing liquid calories.  He was informed of the importance of frequent follow-up visits to maximize his success with intensive lifestyle modifications for his multiple health conditions. He was informed we would discuss his lab results at his next visit unless there is a critical issue that needs to be addressed sooner. Jw agreed to keep his next visit at the agreed upon time to discuss these results.  Objective:   Blood pressure (!) 146/89, pulse 64, temperature 97.8 F (36.6 C), temperature source Oral, height 6\' 7"  (2.007 m), weight (!) 334  lb (151.5 kg), SpO2 99 %. Body mass index is 37.63 kg/m.  EKG: Normal sinus rhythm, rate 61 bpm..  Indirect Calorimeter completed today shows a VO2 of 327 and a REE of 2278.  His calculated basal metabolic rate is 2279 thus his basal metabolic rate is worse than expected.  General: Cooperative, alert, well developed, in no acute distress. HEENT: Conjunctivae and lids unremarkable. Cardiovascular: Regular rhythm.  Lungs: Normal work of breathing. Neurologic: No focal deficits.   Lab Results  Component Value Date   CREATININE 1.11 11/11/2018   BUN 13 11/11/2018   NA 137 11/11/2018   K 4.6 11/11/2018   CL 101 11/11/2018   CO2 24 11/11/2018   Lab Results  Component Value Date   ALT 21 11/11/2018   AST 13 11/11/2018  ALKPHOS 57 11/11/2018   BILITOT 0.7 11/11/2018   Lab Results  Component Value Date   HGBA1C 9.5 (H) 11/25/2018   HGBA1C 6.6 (H) 02/18/2018   HGBA1C 6.0 (H) 06/07/2017   HGBA1C 5.6 02/06/2017   HGBA1C 6.8 (H) 10/09/2016   Lab Results  Component Value Date   TSH 1.403 08/12/2014   Lab Results  Component Value Date   CHOL 110 11/11/2018   HDL 42 11/11/2018   LDLCALC 57 11/11/2018   TRIG 46 11/11/2018   CHOLHDL 2.6 11/11/2018   Lab Results  Component Value Date   WBC 6.7 11/25/2018   HGB 13.4 11/25/2018   HCT 39.9 11/25/2018   MCV 86.4 11/25/2018   PLT 299 11/25/2018   Attestation Statements:   This is the patient's first visit at [facility]. The patient's NEW PATIENT PACKET was reviewed at length. Included in the packet: current and past health history, medications, allergies, ROS, gynecologic history (women only), surgical history, family history, social history, weight history, weight loss surgery history (for those that have had weight loss surgery), nutritional evaluation, mood and food questionnaire, PHQ9, Epworth questionnaire, sleep habits questionnaire, patient life and health improvement goals questionnaire. These will all be  scanned into the patient's chart under media.   During the visit, I independently reviewed the patient's EKG, bioimpedance scale results, and indirect calorimeter results. I used this information to tailor a meal plan for the patient that will help him to lose weight and will improve his obesity-related conditions going forward. I performed a medically necessary appropriate examination and/or evaluation. I discussed the assessment and treatment plan with the patient. The patient was provided an opportunity to ask questions and all were answered. The patient agreed with the plan and demonstrated an understanding of the instructions. Labs were ordered at this visit and will be reviewed at the next visit unless more critical results need to be addressed immediately. Clinical information was updated and documented in the EMR.   I, Insurance claims handler, CMA, am acting as Energy manager for [employer], DO.  I have reviewed the above documentation for accuracy and completeness, and I agree with the above. Helane Rima, DO

## 2019-04-08 LAB — CBC WITH DIFFERENTIAL/PLATELET
Basophils Absolute: 0 10*3/uL (ref 0.0–0.2)
Basos: 1 %
EOS (ABSOLUTE): 0.2 10*3/uL (ref 0.0–0.4)
Eos: 3 %
Hematocrit: 41.4 % (ref 37.5–51.0)
Hemoglobin: 13.9 g/dL (ref 13.0–17.7)
Immature Grans (Abs): 0 10*3/uL (ref 0.0–0.1)
Immature Granulocytes: 0 %
Lymphocytes Absolute: 1.8 10*3/uL (ref 0.7–3.1)
Lymphs: 33 %
MCH: 29.6 pg (ref 26.6–33.0)
MCHC: 33.6 g/dL (ref 31.5–35.7)
MCV: 88 fL (ref 79–97)
Monocytes Absolute: 0.5 10*3/uL (ref 0.1–0.9)
Monocytes: 9 %
Neutrophils Absolute: 3 10*3/uL (ref 1.4–7.0)
Neutrophils: 54 %
Platelets: 264 10*3/uL (ref 150–450)
RBC: 4.7 x10E6/uL (ref 4.14–5.80)
RDW: 13 % (ref 11.6–15.4)
WBC: 5.5 10*3/uL (ref 3.4–10.8)

## 2019-04-08 LAB — LIPID PANEL
Chol/HDL Ratio: 2.5 ratio (ref 0.0–5.0)
Cholesterol, Total: 103 mg/dL (ref 100–199)
HDL: 41 mg/dL (ref >39)
LDL Chol Calc (NIH): 52 mg/dL (ref 0–99)
Triglycerides: 35 mg/dL (ref 0–149)
VLDL Cholesterol Cal: 10 mg/dL (ref 5–40)

## 2019-04-08 LAB — COMPREHENSIVE METABOLIC PANEL
ALT: 23 IU/L (ref 0–44)
AST: 17 IU/L (ref 0–40)
Albumin/Globulin Ratio: 1.8 (ref 1.2–2.2)
Albumin: 4.5 g/dL (ref 3.8–4.9)
Alkaline Phosphatase: 54 IU/L (ref 39–117)
BUN/Creatinine Ratio: 10 (ref 9–20)
BUN: 11 mg/dL (ref 6–24)
Bilirubin Total: 0.5 mg/dL (ref 0.0–1.2)
CO2: 24 mmol/L (ref 20–29)
Calcium: 9.2 mg/dL (ref 8.7–10.2)
Chloride: 105 mmol/L (ref 96–106)
Creatinine, Ser: 1.06 mg/dL (ref 0.76–1.27)
GFR calc Af Amer: 90 mL/min/{1.73_m2} (ref >59)
GFR calc non Af Amer: 78 mL/min/{1.73_m2} (ref >59)
Globulin, Total: 2.5 g/dL (ref 1.5–4.5)
Glucose: 157 mg/dL — ABNORMAL HIGH (ref 65–99)
Potassium: 4.6 mmol/L (ref 3.5–5.2)
Sodium: 141 mmol/L (ref 134–144)
Total Protein: 7 g/dL (ref 6.0–8.5)

## 2019-04-08 LAB — HEMOGLOBIN A1C
Est. average glucose Bld gHb Est-mCnc: 134 mg/dL
Hgb A1c MFr Bld: 6.3 % — ABNORMAL HIGH (ref 4.8–5.6)

## 2019-04-08 LAB — T4, FREE: Free T4: 1.18 ng/dL (ref 0.82–1.77)

## 2019-04-08 LAB — VITAMIN D 25 HYDROXY (VIT D DEFICIENCY, FRACTURES): Vit D, 25-Hydroxy: 30.4 ng/mL (ref 30.0–100.0)

## 2019-04-08 LAB — TSH: TSH: 1.26 u[IU]/mL (ref 0.450–4.500)

## 2019-04-08 LAB — T3: T3, Total: 108 ng/dL (ref 71–180)

## 2019-04-08 LAB — VITAMIN B12: Vitamin B-12: 650 pg/mL (ref 232–1245)

## 2019-04-21 ENCOUNTER — Other Ambulatory Visit: Payer: Self-pay

## 2019-04-21 ENCOUNTER — Encounter (INDEPENDENT_AMBULATORY_CARE_PROVIDER_SITE_OTHER): Payer: Self-pay | Admitting: Family Medicine

## 2019-04-21 ENCOUNTER — Ambulatory Visit (INDEPENDENT_AMBULATORY_CARE_PROVIDER_SITE_OTHER): Payer: 59 | Admitting: Family Medicine

## 2019-04-21 VITALS — BP 123/76 | HR 65 | Temp 97.7°F | Ht 79.0 in | Wt 329.0 lb

## 2019-04-21 DIAGNOSIS — I10 Essential (primary) hypertension: Secondary | ICD-10-CM

## 2019-04-21 DIAGNOSIS — G4733 Obstructive sleep apnea (adult) (pediatric): Secondary | ICD-10-CM | POA: Diagnosis not present

## 2019-04-21 DIAGNOSIS — E1169 Type 2 diabetes mellitus with other specified complication: Secondary | ICD-10-CM

## 2019-04-21 DIAGNOSIS — E559 Vitamin D deficiency, unspecified: Secondary | ICD-10-CM

## 2019-04-21 DIAGNOSIS — Z6837 Body mass index (BMI) 37.0-37.9, adult: Secondary | ICD-10-CM

## 2019-04-21 DIAGNOSIS — E1159 Type 2 diabetes mellitus with other circulatory complications: Secondary | ICD-10-CM

## 2019-04-21 DIAGNOSIS — Z9989 Dependence on other enabling machines and devices: Secondary | ICD-10-CM

## 2019-04-21 MED ORDER — VITAMIN D (ERGOCALCIFEROL) 1.25 MG (50000 UNIT) PO CAPS: 50000.0000 [IU] | ORAL_CAPSULE | ORAL | 0 refills | Status: DC

## 2019-04-22 NOTE — Progress Notes (Signed)
Chief Complaint:   OBESITY Brad [name] is here to discuss his progress with his obesity treatment plan along with follow-up of his obesity related diagnoses. Brad [name] is on the Category 3 Plan and states he is following his eating plan approximately 100% of the time. Brad [name] states he is going to the gym for 60-90 minutes 5 times per week.  Today's visit was #: 2 Starting weight: 334 lbs Starting date: 04/07/2019 Today's weight: 329 lbs Today's date: 04/21/2019 Total lbs lost to date: 5 lbs Total lbs lost since last in-office visit: 5 lbs  Interim History:   Brad [name] provided the following food recall today: Breakfast:  [place], leftover salmon cake from [employer]. Lunch:  Ham sandwich/turkey sandwich, mustard, tomato, pepper. Dinner:  [place], turnip greens. Snacks:  Beef jerky, peanut butter with celery, popcorn.  He reports he has stopped snacking at night.  Subjective:   1. Type 2 diabetes mellitus with other specified complication, without long-term current use of insulin (HCC) Medications reviewed. Brad [name] is taking Victoza 1.8 mg daily.  Lab Results  Component Value Date   HGBA1C 6.3 (H) 04/07/2019   HGBA1C 9.5 (H) 11/25/2018   HGBA1C 6.6 (H) 02/18/2018   Lab Results  Component Value Date   MICROALBUR 0.3 02/06/2017   LDLCALC 52 04/07/2019   CREATININE 1.06 04/07/2019   2. Vitamin D deficiency Brad [name]'s Vitamin D level was 30.4 on 04/07/2019. He is not currently taking vit D. He denies nausea, vomiting or muscle weakness.  3. Hypertension associated with diabetes ([place]) Review: taking medications as instructed, no medication side effects noted, no chest pain on exertion, no dyspnea on exertion, no swelling of ankles.  Brad [name] is taking amlodipine, labetalol, losartan, and spironolactone for blood pressure.  At goal today.  BP Readings from Last 3 Encounters:  04/21/19 123/76  04/07/19 (!) 146/89  11/25/18 132/78   4. OSA on CPAP Brad [name] has a diagnosis of sleep apnea. He  reports that he is using a CPAP regularly.   Assessment/Plan:   1. Type 2 diabetes mellitus with other specified complication, without long-term current use of insulin (HCC) Good blood sugar control is important to decrease the likelihood of diabetic complications such as nephropathy, neuropathy, limb loss, blindness, coronary artery disease, and death. Intensive lifestyle modification including diet, exercise and weight loss are the first line of treatment for diabetes.   2. Vitamin D deficiency Low Vitamin D level contributes to fatigue and are associated with obesity, breast, and colon cancer. He agrees to continue to take prescription Vitamin D @50 ,000 IU every week and will follow-up for routine testing of Vitamin D, at least 2-3 times per year to avoid over-replacement.  Orders - Vitamin D, Ergocalciferol, (DRISDOL) 1.25 MG (50000 UNIT) CAPS capsule; Take 1 capsule (50,000 Units total) by mouth every 7 (seven) days.  Dispense: 4 capsule; Refill: 0  3. Hypertension associated with diabetes ([place]) Brad [name] is working on healthy weight loss and exercise to improve blood pressure control. We will watch for signs of hypotension as he continues his lifestyle modifications.  4. OSA on CPAP Intensive lifestyle modifications are the first line treatment for this issue. We discussed several lifestyle modifications today and he will continue to work on diet, exercise and weight loss efforts. We will continue to monitor. Orders and follow up as documented in patient record.   Counseling  Sleep apnea is a condition in which breathing pauses or becomes shallow during sleep. This happens over and over during the night. This disrupts your  sleep and keeps your body from getting the rest that it needs, which can cause tiredness and lack of energy (fatigue) during the day.  Sleep apnea treatment: If you were given a device to open your airway while you sleep, USE IT!  Sleep hygiene:   Limit or avoid  alcohol, caffeinated beverages, and cigarettes, especially close to bedtime.   Do not eat a large meal or eat spicy foods right before bedtime. This can lead to digestive discomfort that can make it hard for you to sleep.  Keep a sleep diary to help you and your health care provider figure out what could be causing your insomnia.  . Make your bedroom a dark, comfortable place where it is easy to fall asleep. ? Put up shades or blackout curtains to block light from outside. ? Use a [place] noise machine to block noise. ? Keep the temperature cool. . Limit screen use before bedtime. This includes: ? Watching TV. ? Using your smartphone, tablet, or computer. . Stick to a routine that includes going to bed and waking up at the same times every day and night. This can help you fall asleep faster. Consider making a quiet activity, such as reading, part of your nighttime routine. . Try to avoid taking naps during the day so that you sleep better at night. . Get out of bed if you are still awake after 15 minutes of trying to sleep. Keep the lights down, but try reading or doing a quiet activity. When you feel sleepy, go back to bed.  5. Class 2 severe obesity with serious comorbidity and body mass index (BMI) of 37.0 to 37.9 in adult, unspecified obesity type (HCC) Brad [name] is currently in the action stage of change. As such, his goal is to continue with weight loss efforts. He has agreed to the Category 3 Plan.   Exercise goals: No exercise has been prescribed at this time.  Behavioral modification strategies: increasing lean protein intake and increasing water intake.  Brad [name] has agreed to follow-up with our clinic in 2 weeks. He was informed of the importance of frequent follow-up visits to maximize his success with intensive lifestyle modifications for his multiple health conditions.   Objective:   Blood pressure 123/76, pulse 65, temperature 97.7 F (36.5 C), temperature source Oral, height 6'  7" (2.007 m), weight (!) 329 lb (149.2 kg), SpO2 100 %. Body mass index is 37.06 kg/m.  General: Cooperative, alert, well developed, in no acute distress. HEENT: Conjunctivae and lids unremarkable. Cardiovascular: Regular rhythm.  Lungs: Normal work of breathing. Neurologic: No focal deficits.   Lab Results  Component Value Date   CREATININE 1.06 04/07/2019   BUN 11 04/07/2019   NA 141 04/07/2019   K 4.6 04/07/2019   CL 105 04/07/2019   CO2 24 04/07/2019   Lab Results  Component Value Date   ALT 23 04/07/2019   AST 17 04/07/2019   ALKPHOS 54 04/07/2019   BILITOT 0.5 04/07/2019   Lab Results  Component Value Date   HGBA1C 6.3 (H) 04/07/2019   HGBA1C 9.5 (H) 11/25/2018   HGBA1C 6.6 (H) 02/18/2018   HGBA1C 6.0 (H) 06/07/2017   HGBA1C 5.6 02/06/2017   Lab Results  Component Value Date   TSH 1.260 04/07/2019   Lab Results  Component Value Date   CHOL 103 04/07/2019   HDL 41 04/07/2019   LDLCALC 52 04/07/2019   TRIG 35 04/07/2019   CHOLHDL 2.5 04/07/2019   Lab Results  Component Value  Date   WBC 5.5 04/07/2019   HGB 13.9 04/07/2019   HCT 41.4 04/07/2019   MCV 88 04/07/2019   PLT 264 04/07/2019   Attestation Statements:   Reviewed by clinician on day of visit: allergies, medications, problem list, medical history, surgical history, family history, social history, and previous encounter notes.  I, Insurance claims handler, CMA, am acting as Energy manager for [employer], DO.  I have reviewed the above documentation for accuracy and completeness, and I agree with the above. Helane Rima, DO

## 2019-05-11 ENCOUNTER — Encounter (INDEPENDENT_AMBULATORY_CARE_PROVIDER_SITE_OTHER): Payer: Self-pay | Admitting: Family Medicine

## 2019-05-11 ENCOUNTER — Ambulatory Visit (INDEPENDENT_AMBULATORY_CARE_PROVIDER_SITE_OTHER): Payer: 59 | Admitting: Family Medicine

## 2019-05-11 ENCOUNTER — Other Ambulatory Visit: Payer: Self-pay

## 2019-05-11 VITALS — BP 118/72 | HR 68 | Temp 98.2°F | Ht 79.0 in | Wt 326.0 lb

## 2019-05-11 DIAGNOSIS — E559 Vitamin D deficiency, unspecified: Secondary | ICD-10-CM

## 2019-05-11 DIAGNOSIS — E1159 Type 2 diabetes mellitus with other circulatory complications: Secondary | ICD-10-CM

## 2019-05-11 DIAGNOSIS — I10 Essential (primary) hypertension: Secondary | ICD-10-CM

## 2019-05-11 DIAGNOSIS — E1169 Type 2 diabetes mellitus with other specified complication: Secondary | ICD-10-CM | POA: Diagnosis not present

## 2019-05-11 DIAGNOSIS — Z9189 Other specified personal risk factors, not elsewhere classified: Secondary | ICD-10-CM

## 2019-05-11 DIAGNOSIS — I152 Hypertension secondary to endocrine disorders: Secondary | ICD-10-CM

## 2019-05-11 DIAGNOSIS — Z6836 Body mass index (BMI) 36.0-36.9, adult: Secondary | ICD-10-CM

## 2019-05-12 NOTE — Progress Notes (Signed)
Chief Complaint:   OBESITY Brad [name] is here to discuss his progress with his obesity treatment plan along with follow-up of his obesity related diagnoses. Brad [name] is on the Category 3 Plan and states he is following his eating plan approximately 75% of the time. Brad [name] states he is going to the gym for 60-90 minutes 7 times per week.  Today's visit was #: 3 Starting weight: 334 lbs Starting date: 04/07/2019 Today's weight: 326 lbs  Today's date: 05/12/2019 Total lbs lost to date: 8 lbs Total lbs lost since last in-office visit: 3 lbs  Interim History: Brad [name] continues to do very well with exercise. He reports no struggles with his diet.  Denies any polyphagia or orthostatic symptoms.  Subjective:   1. Hypertension associated with diabetes ([place]) Review: taking medications as instructed, no medication side effects noted, no chest pain on exertion, no dyspnea on exertion, no swelling of ankles.  He is taking labetalol, Novasc, and losartan.     BP Readings from Last 3 Encounters:  05/11/19 118/72  04/21/19 123/76  04/07/19 (!) 146/89   2. Type 2 diabetes mellitus with other specified complication, without long-term current use of insulin (HCC) Medications reviewed. Diabetic ROS: no polyuria or polydipsia, no chest pain, dyspnea or TIA's, no numbness, tingling or pain in extremities.  Brad [name] takes Victoza 1.8 mg daily.  Lab Results  Component Value Date   HGBA1C 6.3 (H) 04/07/2019   HGBA1C 9.5 (H) 11/25/2018   HGBA1C 6.6 (H) 02/18/2018   Lab Results  Component Value Date   MICROALBUR 0.3 02/06/2017   LDLCALC 52 04/07/2019   CREATININE 1.06 04/07/2019   3. Vitamin D deficiency [name]'s Vitamin D level was 30.4 on 04/07/2019. He is currently taking prescription vitamin D 50,000 IU each week. He denies nausea, vomiting or muscle weakness.  4. At risk for complication associated with hypotension The patient is at a higher than average risk of hypotension due to weight loss and  medication.  Assessment/Plan:   1. Hypertension associated with diabetes ([place]) Consider decreasing losartan to 50 mg if blood pressure decreases or he becomes orthostatic.  2. Type 2 diabetes mellitus with other specified complication, without long-term current use of insulin (HCC) Good blood sugar control is important to decrease the likelihood of diabetic complications such as nephropathy, neuropathy, limb loss, blindness, coronary artery disease, and death. Intensive lifestyle modification including diet, exercise and weight loss are the first line of treatment for diabetes.   3. Vitamin D deficiency Low Vitamin D level contributes to fatigue and are associated with obesity, breast, and colon cancer. He agrees to continue to take prescription Vitamin D @50 ,000 IU every week and will follow-up for routine testing of Vitamin D, at least 2-3 times per year to avoid over-replacement.  4. At risk for complication associated with hypotension Brad [name] was given approximately 15 minutes of education and counseling today to help avoid hypotension. We discussed risks of hypotension with weight loss and signs of hypotension such as feeling lightheaded or unsteady.  Repetitive spaced learning was employed today to elicit superior memory formation and behavioral change.  5. Class 2 severe obesity with serious comorbidity and body mass index (BMI) of 36.0 to 36.9 in adult, unspecified obesity type (HCC) Brad [name] is currently in the action stage of change. As such, his goal is to continue with weight loss efforts. He has agreed to the Category 3 Plan.   Exercise goals: As is.  Behavioral modification strategies: increasing lean protein intake.  Rykar  has agreed to follow-up with our clinic in 2 weeks. He was informed of the importance of frequent follow-up visits to maximize his success with intensive lifestyle modifications for his multiple health conditions.   Objective:   Blood pressure 118/72,  pulse 68, temperature 98.2 F (36.8 C), temperature source Oral, height 6\' 7"  (2.007 m), weight (!) 326 lb (147.9 kg), SpO2 98 %. Body mass index is 36.73 kg/m.  General: Cooperative, alert, well developed, in no acute distress. HEENT: Conjunctivae and lids unremarkable. Cardiovascular: Regular rhythm.  Lungs: Normal work of breathing. Neurologic: No focal deficits.   Lab Results  Component Value Date   CREATININE 1.06 04/07/2019   BUN 11 04/07/2019   NA 141 04/07/2019   K 4.6 04/07/2019   CL 105 04/07/2019   CO2 24 04/07/2019   Lab Results  Component Value Date   ALT 23 04/07/2019   AST 17 04/07/2019   ALKPHOS 54 04/07/2019   BILITOT 0.5 04/07/2019   Lab Results  Component Value Date   HGBA1C 6.3 (H) 04/07/2019   HGBA1C 9.5 (H) 11/25/2018   HGBA1C 6.6 (H) 02/18/2018   HGBA1C 6.0 (H) 06/07/2017   HGBA1C 5.6 02/06/2017   Lab Results  Component Value Date   TSH 1.260 04/07/2019   Lab Results  Component Value Date   CHOL 103 04/07/2019   HDL 41 04/07/2019   LDLCALC 52 04/07/2019   TRIG 35 04/07/2019   CHOLHDL 2.5 04/07/2019   Lab Results  Component Value Date   WBC 5.5 04/07/2019   HGB 13.9 04/07/2019   HCT 41.4 04/07/2019   MCV 88 04/07/2019   PLT 264 04/07/2019   Attestation Statements:   Reviewed by clinician on day of visit: allergies, medications, problem list, medical history, surgical history, family history, social history, and previous encounter notes.  I, 06/07/2019, CMA, am acting as Insurance claims handler for Energy manager, DO.  I have reviewed the above documentation for accuracy and completeness, and I agree with the above. [employer], DO   MEDICAL DECISION MAKING:  1. MOD: 2 or more chronic, stable illness 2. Review of results of each unique test 3. MOD: Reviewed red flags with patient. Will monitor with regular follow-up visits.

## 2019-06-03 ENCOUNTER — Ambulatory Visit (INDEPENDENT_AMBULATORY_CARE_PROVIDER_SITE_OTHER): Payer: 59 | Admitting: Family Medicine

## 2019-06-09 ENCOUNTER — Encounter (INDEPENDENT_AMBULATORY_CARE_PROVIDER_SITE_OTHER): Payer: Self-pay | Admitting: Family Medicine

## 2019-06-09 ENCOUNTER — Ambulatory Visit (INDEPENDENT_AMBULATORY_CARE_PROVIDER_SITE_OTHER): Payer: 59 | Admitting: Family Medicine

## 2019-06-09 ENCOUNTER — Other Ambulatory Visit: Payer: Self-pay

## 2019-06-09 VITALS — BP 135/86 | HR 64 | Temp 97.9°F | Ht 79.0 in | Wt 329.0 lb

## 2019-06-09 DIAGNOSIS — I10 Essential (primary) hypertension: Secondary | ICD-10-CM | POA: Diagnosis not present

## 2019-06-09 DIAGNOSIS — Z6837 Body mass index (BMI) 37.0-37.9, adult: Secondary | ICD-10-CM

## 2019-06-09 DIAGNOSIS — E1169 Type 2 diabetes mellitus with other specified complication: Secondary | ICD-10-CM

## 2019-06-09 DIAGNOSIS — E1159 Type 2 diabetes mellitus with other circulatory complications: Secondary | ICD-10-CM | POA: Diagnosis not present

## 2019-06-10 NOTE — Progress Notes (Signed)
Chief Complaint:   OBESITY Brad [name] is here to discuss his progress with his obesity treatment plan along with follow-up of his obesity related diagnoses. Edgel is on the Category 3 Plan and states he is following his eating plan approximately 65% of the time. Brad [name] states he is going to the gym for 60-90 minutes 6 times per week.  Today's visit was #: 4 Starting weight: 334 lbs Starting date: 04/07/2019 Today's weight: 329 lbs Today's date: 06/09/2019 Total lbs lost to date: 5 lbs Total lbs lost since last in-office visit: 0  Interim History: Brad [name] attended a wedding in [place] and ate more food that usual.  He is going to the beach next week with his wife.  He is still exercising.  Subjective:   1. Type 2 diabetes mellitus with other specified complication, without long-term current use of insulin (HCC) Medications reviewed. Diabetic ROS: no polyuria or polydipsia, no chest pain, dyspnea or TIA's, no numbness, tingling or pain in extremities.   Lab Results  Component Value Date   HGBA1C 6.3 (H) 04/07/2019   HGBA1C 9.5 (H) 11/25/2018   HGBA1C 6.6 (H) 02/18/2018   Lab Results  Component Value Date   MICROALBUR 0.3 02/06/2017   LDLCALC 52 04/07/2019   CREATININE 1.06 04/07/2019   2. Hypertension associated with diabetes (HCC) Review: taking medications as instructed, no medication side effects noted, no chest pain on exertion, no dyspnea on exertion, no swelling of ankles.   BP Readings from Last 3 Encounters:  06/09/19 135/86  05/11/19 118/72  04/21/19 123/76   Assessment/Plan:   1. Type 2 diabetes mellitus with other specified complication, without long-term current use of insulin (HCC) Good blood sugar control is important to decrease the likelihood of diabetic complications such as nephropathy, neuropathy, limb loss, blindness, coronary artery disease, and death. Intensive lifestyle modification including diet, exercise and weight loss are the first line of treatment  for diabetes.   2. Hypertension associated with diabetes (HCC) Brad [name] is working on healthy weight loss and exercise to improve blood pressure control. We will watch for signs of hypotension as he continues his lifestyle modifications.  3. Class 2 severe obesity with serious comorbidity and body mass index (BMI) of 37.0 to 37.9 in adult, unspecified obesity type (HCC) [name] is currently in the action stage of change. As such, his goal is to continue with weight loss efforts. He has agreed to the Category 3 Plan.   Exercise goals: For substantial health benefits, adults should do at least 150 minutes (2 hours and 30 minutes) a week of moderate-intensity, or 75 minutes (1 hour and 15 minutes) a week of vigorous-intensity aerobic physical activity, or an equivalent combination of moderate- and vigorous-intensity aerobic activity. Aerobic activity should be performed in episodes of at least 10 minutes, and preferably, it should be spread throughout the week.  Behavioral modification strategies: increasing lean protein intake.  Brad [name] has agreed to follow-up with our clinic in 2 weeks. He was informed of the importance of frequent follow-up visits to maximize his success with intensive lifestyle modifications for his multiple health conditions.   Objective:   Blood pressure 135/86, pulse 64, temperature 97.9 F (36.6 C), temperature source Oral, height 6\' 7"  (2.007 m), weight (!) 329 lb (149.2 kg), SpO2 98 %. Body mass index is 37.06 kg/m.  General: Cooperative, alert, well developed, in no acute distress. HEENT: Conjunctivae and lids unremarkable. Cardiovascular: Regular rhythm.  Lungs: Normal work of breathing. Neurologic: No focal deficits.  Lab Results  Component Value Date   CREATININE 1.06 04/07/2019   BUN 11 04/07/2019   NA 141 04/07/2019   K 4.6 04/07/2019   CL 105 04/07/2019   CO2 24 04/07/2019   Lab Results  Component Value Date   ALT 23 04/07/2019   AST 17 04/07/2019    ALKPHOS 54 04/07/2019   BILITOT 0.5 04/07/2019   Lab Results  Component Value Date   HGBA1C 6.3 (H) 04/07/2019   HGBA1C 9.5 (H) 11/25/2018   HGBA1C 6.6 (H) 02/18/2018   HGBA1C 6.0 (H) 06/07/2017   HGBA1C 5.6 02/06/2017   Lab Results  Component Value Date   TSH 1.260 04/07/2019   Lab Results  Component Value Date   CHOL 103 04/07/2019   HDL 41 04/07/2019   LDLCALC 52 04/07/2019   TRIG 35 04/07/2019   CHOLHDL 2.5 04/07/2019   Lab Results  Component Value Date   WBC 5.5 04/07/2019   HGB 13.9 04/07/2019   HCT 41.4 04/07/2019   MCV 88 04/07/2019   PLT 264 04/07/2019   Attestation Statements:   Reviewed by clinician on day of visit: allergies, medications, problem list, medical history, surgical history, family history, social history, and previous encounter notes.  Time spent on visit including pre-visit chart review and post-visit care and charting was 20 minutes.   I, Water quality scientist, CMA, am acting as Location manager for [employer], DO.  I have reviewed the above documentation for accuracy and completeness, and I agree with the above. Briscoe Deutscher, DO

## 2019-07-06 ENCOUNTER — Other Ambulatory Visit: Payer: Self-pay

## 2019-07-06 ENCOUNTER — Other Ambulatory Visit: Payer: Self-pay | Admitting: Family Medicine

## 2019-07-06 ENCOUNTER — Encounter (INDEPENDENT_AMBULATORY_CARE_PROVIDER_SITE_OTHER): Payer: Self-pay | Admitting: Family Medicine

## 2019-07-06 ENCOUNTER — Ambulatory Visit (INDEPENDENT_AMBULATORY_CARE_PROVIDER_SITE_OTHER): Payer: 59 | Admitting: Family Medicine

## 2019-07-06 VITALS — BP 130/89 | HR 64 | Temp 98.2°F | Ht 79.0 in | Wt 327.0 lb

## 2019-07-06 DIAGNOSIS — Z9189 Other specified personal risk factors, not elsewhere classified: Secondary | ICD-10-CM | POA: Diagnosis not present

## 2019-07-06 DIAGNOSIS — Z6836 Body mass index (BMI) 36.0-36.9, adult: Secondary | ICD-10-CM | POA: Diagnosis not present

## 2019-07-06 DIAGNOSIS — E1159 Type 2 diabetes mellitus with other circulatory complications: Secondary | ICD-10-CM | POA: Diagnosis not present

## 2019-07-06 DIAGNOSIS — I10 Essential (primary) hypertension: Secondary | ICD-10-CM

## 2019-07-06 DIAGNOSIS — E1169 Type 2 diabetes mellitus with other specified complication: Secondary | ICD-10-CM

## 2019-07-06 DIAGNOSIS — E559 Vitamin D deficiency, unspecified: Secondary | ICD-10-CM

## 2019-07-06 MED ORDER — AMLODIPINE BESYLATE 10 MG PO TABS: 10.0000 mg | ORAL_TABLET | Freq: Every day | ORAL | 0 refills | Status: DC

## 2019-07-06 MED ORDER — LOSARTAN POTASSIUM 100 MG PO TABS: 100.0000 mg | ORAL_TABLET | Freq: Every day | ORAL | 0 refills | Status: DC

## 2019-07-06 NOTE — Progress Notes (Signed)
Chief Complaint:   OBESITY Brad [name] is here to discuss his progress with his obesity treatment plan along with follow-up of his obesity related diagnoses. Brad [name] is on the Category 3 Plan and states he is following his eating plan approximately 65% of the time. Brad [name] states he is going to the gym for 60-90 minutes 6 times per week.  Today's visit was #: 5 Starting weight: 334 lbs Starting date: 04/07/2019 Today's weight: 327 lbs Today's date: 07/06/2019 Total lbs lost to date: 7 lbs Total lbs lost since last in-office visit: 2 lbs  Interim History: Brad [name] says he is still happy with the plan.  He went to the beach and says he made good choices.  He is due for an A1c.  Subjective:   1. Type 2 diabetes mellitus with other specified complication, without long-term current use of insulin (HCC) Medications reviewed. Diabetic ROS: no polyuria or polydipsia, no chest pain, dyspnea or TIA's, no numbness, tingling or pain in extremities.   Lab Results  Component Value Date   HGBA1C 6.3 (H) 04/07/2019   HGBA1C 9.5 (H) 11/25/2018   HGBA1C 6.6 (H) 02/18/2018   Lab Results  Component Value Date   MICROALBUR 0.3 02/06/2017   LDLCALC 52 04/07/2019   CREATININE 1.06 04/07/2019   2. Hypertension associated with diabetes ([place]) Review: taking medications as instructed, no medication side effects noted, no chest pain on exertion, no dyspnea on exertion, no swelling of ankles.   BP Readings from Last 3 Encounters:  07/06/19 130/89  06/09/19 135/86  05/11/19 118/72   3. Vitamin D deficiency Brad [name]'s Vitamin D level was 30.4 on 04/07/2019. He is currently taking prescription vitamin D 50,000 IU each week. He denies nausea, vomiting or muscle weakness.  Assessment/Plan:   1. Type 2 diabetes mellitus with other specified complication, without long-term current use of insulin (HCC) Good blood sugar control is important to decrease the likelihood of diabetic complications such as nephropathy,  neuropathy, limb loss, blindness, coronary artery disease, and death. Intensive lifestyle modification including diet, exercise and weight loss are the first line of treatment for diabetes.  Will check his A1c today.  2. Hypertension associated with diabetes (Brad [name]) Brad [name] is working on healthy weight loss and exercise to improve blood pressure control. We will watch for signs of hypotension as he continues his lifestyle modifications.  Orders - losartan (COZAAR) 100 MG tablet; Take 1 tablet (100 mg total) by mouth daily.  Dispense: 30 tablet; Refill: 0 - amLODipine (NORVASC) 10 MG tablet; Take 1 tablet (10 mg total) by mouth daily.  Dispense: 30 tablet; Refill: 0  3. Vitamin D deficiency Low Vitamin D level contributes to fatigue and are associated with obesity, breast, and colon cancer. He agrees to start to take OTC Vitamin D @2 ,000 IU daily and will follow-up for routine testing of Vitamin D, at least 2-3 times per year to avoid over-replacement.  4. At risk for heart disease Brad [name] was given approximately 15 minutes of coronary artery disease prevention counseling today. He is 59 y.o. male and has risk factors for heart disease including obesity. We discussed intensive lifestyle modifications today with an emphasis on specific weight loss instructions and strategies.   Repetitive spaced learning was employed today to elicit superior memory formation and behavioral change.  5. Class 2 severe obesity with serious comorbidity and body mass index (BMI) of 36.0 to 36.9 in adult, unspecified obesity type (HCC) Tereso is currently in the action stage of change. As such, his  goal is to continue with weight loss efforts. He has agreed to the Category 3 Plan.   Exercise goals: For substantial health benefits, adults should do at least 150 minutes (2 hours and 30 minutes) a week of moderate-intensity, or 75 minutes (1 hour and 15 minutes) a week of vigorous-intensity aerobic physical activity, or an  equivalent combination of moderate- and vigorous-intensity aerobic activity. Aerobic activity should be performed in episodes of at least 10 minutes, and preferably, it should be spread throughout the week.  Behavioral modification strategies: increasing lean protein intake.  Brad [name] has agreed to follow-up with our clinic in 3 weeks. He was informed of the importance of frequent follow-up visits to maximize his success with intensive lifestyle modifications for his multiple health conditions.   Brad [name] was informed we would discuss his lab results at his next visit unless there is a critical issue that needs to be addressed sooner. Brad [name] agreed to keep his next visit at the agreed upon time to discuss these results.  Objective:   Blood pressure 130/89, pulse 64, temperature 98.2 F (36.8 C), temperature source Oral, height 6\' 7"  (2.007 m), weight (!) 327 lb (148.3 kg), SpO2 99 %. Body mass index is 36.84 kg/m.  General: Cooperative, alert, well developed, in no acute distress. HEENT: Conjunctivae and lids unremarkable. Cardiovascular: Regular rhythm.  Lungs: Normal work of breathing. Neurologic: No focal deficits.   Lab Results  Component Value Date   CREATININE 1.06 04/07/2019   BUN 11 04/07/2019   NA 141 04/07/2019   K 4.6 04/07/2019   CL 105 04/07/2019   CO2 24 04/07/2019   Lab Results  Component Value Date   ALT 23 04/07/2019   AST 17 04/07/2019   ALKPHOS 54 04/07/2019   BILITOT 0.5 04/07/2019   Lab Results  Component Value Date   HGBA1C 6.3 (H) 04/07/2019   HGBA1C 9.5 (H) 11/25/2018   HGBA1C 6.6 (H) 02/18/2018   HGBA1C 6.0 (H) 06/07/2017   HGBA1C 5.6 02/06/2017   Lab Results  Component Value Date   TSH 1.260 04/07/2019   Lab Results  Component Value Date   CHOL 103 04/07/2019   HDL 41 04/07/2019   LDLCALC 52 04/07/2019   TRIG 35 04/07/2019   CHOLHDL 2.5 04/07/2019   Lab Results  Component Value Date   WBC 5.5 04/07/2019   HGB 13.9 04/07/2019   HCT  41.4 04/07/2019   MCV 88 04/07/2019   PLT 264 04/07/2019   Attestation Statements:   Reviewed by clinician on day of visit: allergies, medications, problem list, medical history, surgical history, family history, social history, and previous encounter notes.  I, 06/07/2019, CMA, am acting as transcriptionist for Insurance claims handler, DO  I have reviewed the above documentation for accuracy and completeness, and I agree with the above. Helane Rima, DO

## 2019-07-07 LAB — HEMOGLOBIN A1C
Est. average glucose Bld gHb Est-mCnc: 146 mg/dL
Hgb A1c MFr Bld: 6.7 % — ABNORMAL HIGH (ref 4.8–5.6)

## 2019-07-27 ENCOUNTER — Ambulatory Visit (INDEPENDENT_AMBULATORY_CARE_PROVIDER_SITE_OTHER): Payer: 59 | Admitting: Family Medicine

## 2019-07-30 ENCOUNTER — Other Ambulatory Visit: Payer: Self-pay | Admitting: Family Medicine

## 2019-08-12 ENCOUNTER — Telehealth: Payer: Self-pay

## 2019-08-12 ENCOUNTER — Ambulatory Visit: Payer: 59 | Admitting: Neurology

## 2019-08-12 NOTE — Telephone Encounter (Signed)
Pt no showed 08/12/2019 f/u with Dr. Frances Furbish.

## 2019-08-13 ENCOUNTER — Encounter (INDEPENDENT_AMBULATORY_CARE_PROVIDER_SITE_OTHER): Payer: Self-pay | Admitting: Family Medicine

## 2019-08-13 ENCOUNTER — Other Ambulatory Visit: Payer: Self-pay

## 2019-08-13 ENCOUNTER — Ambulatory Visit (INDEPENDENT_AMBULATORY_CARE_PROVIDER_SITE_OTHER): Payer: Medicare Other | Admitting: Family Medicine

## 2019-08-13 VITALS — BP 135/100 | HR 58 | Temp 98.1°F | Ht 79.0 in | Wt 333.0 lb

## 2019-08-13 DIAGNOSIS — E1159 Type 2 diabetes mellitus with other circulatory complications: Secondary | ICD-10-CM

## 2019-08-13 DIAGNOSIS — I10 Essential (primary) hypertension: Secondary | ICD-10-CM

## 2019-08-13 DIAGNOSIS — N528 Other male erectile dysfunction: Secondary | ICD-10-CM | POA: Diagnosis not present

## 2019-08-13 DIAGNOSIS — E559 Vitamin D deficiency, unspecified: Secondary | ICD-10-CM | POA: Diagnosis not present

## 2019-08-13 DIAGNOSIS — Z9189 Other specified personal risk factors, not elsewhere classified: Secondary | ICD-10-CM | POA: Diagnosis not present

## 2019-08-13 DIAGNOSIS — E1169 Type 2 diabetes mellitus with other specified complication: Secondary | ICD-10-CM

## 2019-08-13 DIAGNOSIS — I152 Hypertension secondary to endocrine disorders: Secondary | ICD-10-CM

## 2019-08-13 DIAGNOSIS — Z6837 Body mass index (BMI) 37.0-37.9, adult: Secondary | ICD-10-CM

## 2019-08-13 MED ORDER — TADALAFIL 20 MG PO TABS: 20.0000 mg | ORAL_TABLET | ORAL | 0 refills | Status: DC | PRN

## 2019-08-13 MED ORDER — AMLODIPINE BESYLATE 10 MG PO TABS: 10.0000 mg | ORAL_TABLET | Freq: Every day | ORAL | 0 refills | Status: DC

## 2019-08-13 MED ORDER — LOSARTAN POTASSIUM 100 MG PO TABS: 100.0000 mg | ORAL_TABLET | Freq: Every day | ORAL | 0 refills | Status: DC

## 2019-08-13 MED ORDER — VITAMIN D (ERGOCALCIFEROL) 1.25 MG (50000 UNIT) PO CAPS: 50000.0000 [IU] | ORAL_CAPSULE | ORAL | 0 refills | Status: DC

## 2019-08-13 NOTE — Progress Notes (Signed)
Chief Complaint:   OBESITY Brad [name] is here to discuss his progress with his obesity treatment plan along with follow-up of his obesity related diagnoses. Brad [name] is on the Category 3 Plan and states he is following his eating plan approximately 65% of the time. Brad [name] states he is doing cardio and strength training for 60 minutes 6 times per week.  Today's visit was #: 6 Starting weight: 334 lbs Starting date: 04/07/2019 Today's weight: 333 lbs Today's date: 08/13/2019 Total lbs lost to date: 1 lb Total lbs lost since last in-office visit: 0  Interim History: Brad [name]'s last visit was 5 weeks ago.  He says he had a right hamstring strain a few weeks ago.  Subjective:   1. Type 2 diabetes mellitus with other specified complication, without long-term current use of insulin (HCC) Medications reviewed. Diabetic ROS: no polyuria or polydipsia, no chest pain, dyspnea or TIA's, no numbness, tingling or pain in extremities.  He is taking Victoza and metformin.  Lab Results  Component Value Date   HGBA1C 6.7 (H) 07/06/2019   HGBA1C 6.3 (H) 04/07/2019   HGBA1C 9.5 (H) 11/25/2018   Lab Results  Component Value Date   MICROALBUR 0.3 02/06/2017   LDLCALC 52 04/07/2019   CREATININE 1.06 04/07/2019   2. Hypertension associated with type 2 diabetes mellitus (HCC) Review: taking medications as instructed, no medication side effects noted, no chest pain on exertion, no dyspnea on exertion, no swelling of ankles.  Brad [name]'s blood pressure is not at goal.  He is on Norvasc, Cozaar, spironolactone, and labetalol.   BP Readings from Last 3 Encounters:  08/13/19 (!) 135/100  07/06/19 130/89  06/09/19 135/86   3. Vitamin D deficiency Brad [name]'s Vitamin D level was 30.4 on 04/07/2019. He is currently taking prescription vitamin D 50,000 IU each week. He denies nausea, vomiting or muscle weakness.  4. Other male erectile dysfunction Brad [name] has ED and needs a refill on his Cialis today.  5. At risk  for heart disease Brad [name] is at a higher than average risk for cardiovascular disease due to obesity.   Assessment/Plan:   1. Type 2 diabetes mellitus with other specified complication, without long-term current use of insulin (HCC) Good blood sugar control is important to decrease the likelihood of diabetic complications such as nephropathy, neuropathy, limb loss, blindness, coronary artery disease, and death. Intensive lifestyle modification including diet, exercise and weight loss are the first line of treatment for diabetes.   2. Hypertension associated with type 2 diabetes mellitus (HCC) Brad [name] is working on healthy weight loss and exercise to improve blood pressure control. We will watch for signs of hypotension as he continues his lifestyle modifications.  Will refill Norvasc and losartan, as per below.  Orders - amLODipine (NORVASC) 10 MG tablet; Take 1 tablet (10 mg total) by mouth daily.  Dispense: 30 tablet; Refill: 0 - losartan (COZAAR) 100 MG tablet; Take 1 tablet (100 mg total) by mouth daily.  Dispense: 30 tablet; Refill: 0  3. Vitamin D deficiency Low Vitamin D level contributes to fatigue and are associated with obesity, breast, and colon cancer. He agrees to continue to take prescription Vitamin D @50 ,000 IU every week and will follow-up for routine testing of Vitamin D, at least 2-3 times per year to avoid over-replacement.  Orders - Vitamin D, Ergocalciferol, (DRISDOL) 1.25 MG (50000 UNIT) CAPS capsule; Take 1 capsule (50,000 Units total) by mouth every 7 (seven) days.  Dispense: 4 capsule; Refill: 0  4. Other male erectile  dysfunction Will provide courtesy refill for Cialis today, as per below.  Orders - tadalafil (CIALIS) 20 MG tablet; Take 1 tablet (20 mg total) by mouth as needed.  Dispense: 10 tablet; Refill: 0  5. At risk for heart disease Brad [name] was given approximately 15 minutes of coronary artery disease prevention counseling today. He is 59 y.o. male and has  risk factors for heart disease including obesity. We discussed intensive lifestyle modifications today with an emphasis on specific weight loss instructions and strategies.   Repetitive spaced learning was employed today to elicit superior memory formation and behavioral change.  6. Class 2 severe obesity with serious comorbidity and body mass index (BMI) of 37.0 to 37.9 in adult, unspecified obesity type (HCC) Brad [name] is currently in the action stage of change. As such, his goal is to continue with weight loss efforts. He has agreed to the Category 3 Plan.   Exercise goals: For substantial health benefits, adults should do at least 150 minutes (2 hours and 30 minutes) a week of moderate-intensity, or 75 minutes (1 hour and 15 minutes) a week of vigorous-intensity aerobic physical activity, or an equivalent combination of moderate- and vigorous-intensity aerobic activity. Aerobic activity should be performed in episodes of at least 10 minutes, and preferably, it should be spread throughout the week.  Behavioral modification strategies: increasing lean protein intake.  Brad [name] has agreed to follow-up with our clinic in 3 weeks. He was informed of the importance of frequent follow-up visits to maximize his success with intensive lifestyle modifications for his multiple health conditions.   Objective:   Blood pressure (!) 135/100, pulse (!) 58, temperature 98.1 F (36.7 C), temperature source Oral, height 6\' 7"  (2.007 m), weight (!) 333 lb (151 kg), SpO2 97 %. Body mass index is 37.51 kg/m.  General: Cooperative, alert, well developed, in no acute distress. HEENT: Conjunctivae and lids unremarkable. Cardiovascular: Regular rhythm.  Lungs: Normal work of breathing. Neurologic: No focal deficits.   Lab Results  Component Value Date   CREATININE 1.06 04/07/2019   BUN 11 04/07/2019   NA 141 04/07/2019   K 4.6 04/07/2019   CL 105 04/07/2019   CO2 24 04/07/2019   Lab Results  Component Value  Date   ALT 23 04/07/2019   AST 17 04/07/2019   ALKPHOS 54 04/07/2019   BILITOT 0.5 04/07/2019   Lab Results  Component Value Date   HGBA1C 6.7 (H) 07/06/2019   HGBA1C 6.3 (H) 04/07/2019   HGBA1C 9.5 (H) 11/25/2018   HGBA1C 6.6 (H) 02/18/2018   HGBA1C 6.0 (H) 06/07/2017   Lab Results  Component Value Date   TSH 1.260 04/07/2019   Lab Results  Component Value Date   CHOL 103 04/07/2019   HDL 41 04/07/2019   LDLCALC 52 04/07/2019   TRIG 35 04/07/2019   CHOLHDL 2.5 04/07/2019   Lab Results  Component Value Date   WBC 5.5 04/07/2019   HGB 13.9 04/07/2019   HCT 41.4 04/07/2019   MCV 88 04/07/2019   PLT 264 04/07/2019   Attestation Statements:   Reviewed by clinician on day of visit: allergies, medications, problem list, medical history, surgical history, family history, social history, and previous encounter notes.  I, 06/07/2019, CMA, am acting as transcriptionist for Insurance claims handler, DO  I have reviewed the above documentation for accuracy and completeness, and I agree with the above. Helane Rima, DO

## 2019-09-07 ENCOUNTER — Other Ambulatory Visit: Payer: Self-pay

## 2019-09-07 ENCOUNTER — Encounter (INDEPENDENT_AMBULATORY_CARE_PROVIDER_SITE_OTHER): Payer: Self-pay | Admitting: Family Medicine

## 2019-09-07 ENCOUNTER — Ambulatory Visit (INDEPENDENT_AMBULATORY_CARE_PROVIDER_SITE_OTHER): Payer: Medicare Other | Admitting: Family Medicine

## 2019-09-07 VITALS — BP 150/96 | HR 63 | Temp 97.8°F | Ht 79.0 in | Wt 333.0 lb

## 2019-09-07 DIAGNOSIS — Z9189 Other specified personal risk factors, not elsewhere classified: Secondary | ICD-10-CM

## 2019-09-07 DIAGNOSIS — E1169 Type 2 diabetes mellitus with other specified complication: Secondary | ICD-10-CM | POA: Diagnosis not present

## 2019-09-07 DIAGNOSIS — Z6837 Body mass index (BMI) 37.0-37.9, adult: Secondary | ICD-10-CM | POA: Diagnosis not present

## 2019-09-07 MED ORDER — OZEMPIC (0.25 OR 0.5 MG/DOSE) 2 MG/1.5ML ~~LOC~~ SOPN: 0.5000 mg | PEN_INJECTOR | SUBCUTANEOUS | 0 refills | Status: DC

## 2019-09-07 NOTE — Progress Notes (Signed)
Chief Complaint:   OBESITY Brad [name] is here to discuss his progress with his obesity treatment plan along with follow-up of his obesity related diagnoses. Brad [name] is on the Category 3 Plan and states he is following his eating plan approximately 60% of the time. Brad [name] states he is doing cardio for 60 minutes 5 times per week.  Today's visit was #: 7 Starting weight: 334 lbs Starting date: 04/07/2019 Today's weight: 333 lbs Today's date: 09/07/2019 Total lbs lost to date: 1 lb Total lbs lost since last in-office visit: 0  Interim History: Brad [name] says he is frustrated with his lack of weight loss.  He would like to be more aggressive.  Subjective:   1. Type 2 diabetes mellitus with other specified complication, without long-term current use of insulin (HCC) Medications reviewed. Diabetic ROS: no polyuria or polydipsia, no chest pain, dyspnea or TIA's, no numbness, tingling or pain in extremities.   Lab Results  Component Value Date   HGBA1C 6.7 (H) 07/06/2019   HGBA1C 6.3 (H) 04/07/2019   HGBA1C 9.5 (H) 11/25/2018   Lab Results  Component Value Date   MICROALBUR 0.3 02/06/2017   LDLCALC 52 04/07/2019   CREATININE 1.06 04/07/2019   Assessment/Plan:   1. Type 2 diabetes mellitus with other specified complication, without long-term current use of insulin (HCC) Good blood sugar control is important to decrease the likelihood of diabetic complications such as nephropathy, neuropathy, limb loss, blindness, coronary artery disease, and death. Intensive lifestyle modification including diet, exercise and weight loss are the first line of treatment for diabetes.  Stop Victoza.  Continue Metamucil.  Start Ozempic, as per below.  Orders - Semaglutide,0.25 or 0.5MG /DOS, (OZEMPIC, 0.25 OR 0.5 MG/DOSE,) 2 MG/1.5ML SOPN; Inject 0.375 mLs (0.5 mg total) into the skin once a week.  Dispense: 1.5 mL; Refill: 0  2. At risk for heart disease Brad [name] was given approximately 15 minutes of coronary  artery disease prevention counseling today. He is 59 y.o. male and has risk factors for heart disease including obesity and diabetes. We discussed intensive lifestyle modifications today with an emphasis on specific weight loss instructions and strategies.   Repetitive spaced learning was employed today to elicit superior memory formation and behavioral change.  3. Class 2 severe obesity with serious comorbidity and body mass index (BMI) of 37.0 to 37.9 in adult, unspecified obesity type (HCC) Brad [name] is currently in the action stage of change. As such, his goal is to continue with weight loss efforts. He has agreed to the Category 3 Plan.   Exercise goals: For substantial health benefits, adults should do at least 150 minutes (2 hours and 30 minutes) a week of moderate-intensity, or 75 minutes (1 hour and 15 minutes) a week of vigorous-intensity aerobic physical activity, or an equivalent combination of moderate- and vigorous-intensity aerobic activity. Aerobic activity should be performed in episodes of at least 10 minutes, and preferably, it should be spread throughout the week.  Behavioral modification strategies: increasing lean protein intake and increasing high fiber foods.  Brad [name] has agreed to follow-up with our clinic in 3 weeks. He was informed of the importance of frequent follow-up visits to maximize his success with intensive lifestyle modifications for his multiple health conditions.   Objective:   Blood pressure (!) 150/96, pulse 63, temperature 97.8 F (36.6 C), temperature source Oral, height 6\' 7"  (2.007 m), weight (!) 333 lb (151 kg), SpO2 97 %. Body mass index is 37.51 kg/m.  General: Cooperative, alert, well developed, in  no acute distress. HEENT: Conjunctivae and lids unremarkable. Cardiovascular: Regular rhythm.  Lungs: Normal work of breathing. Neurologic: No focal deficits.   Lab Results  Component Value Date   CREATININE 1.06 04/07/2019   BUN 11 04/07/2019   NA  141 04/07/2019   K 4.6 04/07/2019   CL 105 04/07/2019   CO2 24 04/07/2019   Lab Results  Component Value Date   ALT 23 04/07/2019   AST 17 04/07/2019   ALKPHOS 54 04/07/2019   BILITOT 0.5 04/07/2019   Lab Results  Component Value Date   HGBA1C 6.7 (H) 07/06/2019   HGBA1C 6.3 (H) 04/07/2019   HGBA1C 9.5 (H) 11/25/2018   HGBA1C 6.6 (H) 02/18/2018   HGBA1C 6.0 (H) 06/07/2017   Lab Results  Component Value Date   TSH 1.260 04/07/2019   Lab Results  Component Value Date   CHOL 103 04/07/2019   HDL 41 04/07/2019   LDLCALC 52 04/07/2019   TRIG 35 04/07/2019   CHOLHDL 2.5 04/07/2019   Lab Results  Component Value Date   WBC 5.5 04/07/2019   HGB 13.9 04/07/2019   HCT 41.4 04/07/2019   MCV 88 04/07/2019   PLT 264 04/07/2019   Attestation Statements:   Reviewed by clinician on day of visit: allergies, medications, problem list, medical history, surgical history, family history, social history, and previous encounter notes.  I, Brad [name], Brad [name], am acting as transcriptionist for Brad Rima, DO  I have reviewed the above documentation for accuracy and completeness, and I agree with the above. Brad Rima, DO

## 2019-09-16 ENCOUNTER — Encounter: Payer: Self-pay | Admitting: Neurology

## 2019-09-16 ENCOUNTER — Ambulatory Visit: Payer: Medicare Other | Admitting: Neurology

## 2019-09-16 VITALS — BP 146/86 | HR 64 | Ht >= 80 in | Wt 334.0 lb

## 2019-09-16 DIAGNOSIS — Z9989 Dependence on other enabling machines and devices: Secondary | ICD-10-CM

## 2019-09-16 DIAGNOSIS — G4733 Obstructive sleep apnea (adult) (pediatric): Secondary | ICD-10-CM

## 2019-09-16 NOTE — Patient Instructions (Signed)
Keep up the good work with your CPAP and weight loss! I will see you back in one year. Stay well and keep your positive attitude and outlook, it's refreshing!

## 2019-09-16 NOTE — Progress Notes (Signed)
Subjective:    Patient ID: Brad [name] is a 59 y.o. male.  HPI     Interim history:   Brad [name] is a 59 year old right-handed gentleman with an underlying medical history of hypertension, prior lacunar strokes, recent hemorrhagic stroke in April 2016, recent smoking cessation, and obesity, who presents for follow up consultation of his obstructive sleep apnea, on CPAP therapy, for his yearly checkup. The patient is unaccompanied today. He missed an appointment on 08/12/19. I last saw him on 08/11/2018, At which time he was compliant with his CPAP and doing well.  He was advised to follow-up in 1 year routinely.   Today, 09/16/2019: I reviewed his CPAP compliance data from 08/16/2019 through 09/14/2019, which is a total of 30 days, during which time he used his machine every night with percent use days greater than 4 hours at 100%, indicating superb compliance with an average usage of 7 hours and 25 minutes, residual AHI at goal at 0.5/h, leak acceptable with a 95th percentile at 12.4 L/min on a pressure of 7 cm with EPR of 1.  He reports doing well with his CPAP.  He continues to benefit from treatment and is up-to-date with his supplies.  He is motivated to continue with treatment.  He is working on weight loss, for the past 3 months he has been going to the medical weight management clinic through [facility].  Previously:  I saw him on 08/08/2017, at which time he was compliant with his CPAP and doing well.  He was working on weight loss.  He was advised to follow-up in 1 year.    I reviewed his CPAP compliance data from 07/08/2018 through 078 2020 which is a total of 30 days, during which time he used his machine 29 days with percent used days greater than 4 hours at 97%, indicating excellent compliance with an average usage of 7 hours and 21 minutes, residual AHI at goal at 0.3/h, leak acceptable with a 95th percentile at 13.4 L/min on a pressure of 7 cm with EPR of 1.  Set up date was 10/18/2014.      I reviewed his CPAP compliance data from 07/07/2017 through 08/05/2017 which is a total of 30 days, during which time he used his CPAP every night with percent used days greater than 4 hours at 97%, indicating excellent compliance with an average usage of 5 hours and 53 minutes, residual AHI at goal at 0.5 per hour, leak acceptable with the 95th percentile at 7.9 L/m on a pressure of 7 cm with EPR of 1.   I saw him on 08/08/2016, at which time he was compliant with CPAP and reported ongoing good results. Working on weight loss as well.   I reviewed his CPAP compliance data from 07/08/2016 through 08/06/2016, which is a total of 30 days, during which time he used his CPAP every night with percent used days greater than 4 hours at 90%, indicating excellent compliance with an average usage of 5 hours and 3 minutes, residual AHI low at 0.5 per hour, leak acceptable with the 95th percentile at 14.6 L/m on a pressure of 7 cm with EPR of 1.    I saw him 02/09/2015, at which time he reported doing fairly well. He was tolerating CPAP and he was compliant with treatment. He indicated that he would wake up rested and daytime energy was better. He was working on weight loss and exercising regularly. We talked about is his baseline sleep study results  from August 2016 and his CPAP titration study results from September 2016 in detail at the time. He was encouraged to continue with CPAP therapy regularly.   I reviewed his CPAP compliance data from 07/09/2015 through 08/07/2015 which is a total of 30 days during which time he used his machine 24 days with percent used days greater than 4 hours at 60%, indicating suboptimal compliance with an average usage for all nights of 3 hours and 3 minutes, residual AHI 1.1 per hour, leak acceptable with the 95th percentile at 15.6 L/m on a pressure of 7 cm with EPR of 1.   I first met him on 07/20/2014 at the request of Dr. Jaynee Eagles, at which time the patient reported snoring,  multiple nighttime awakenings, nocturia and excessive daytime somnolence. I invited him back for sleep study. He had a baseline sleep study, followed by a CPAP titration study. I went over his test results with him in detail today. His baseline sleep study from 09/03/2014 showed a sleep efficiency of 63.9% with a latency to sleep of 81.5 minutes, and wake after sleep onset of 78 minutes with mild to moderate sleep fragmentation noted. He had an increased percentage of stage II sleep, absence of slow-wave sleep and REM sleep at 26.1% with a REM latency of 56 minutes. He had no significant PLMS, EKG or EEG changes. He had mild to moderate snoring. He did not have any supine sleep. Total AHI was 10 per hour, rising to 20.4 per hour during REM sleep. Average oxygen saturation was 96%, nadir was 90%. Given his sleep related complaints and his history of stroke, I invited him back for a full night CPAP titration study. He had this on 10/08/2014: Sleep efficiency was 73.6% with a latency of sleep of 13.5 minutes and wake after sleep onset of 87 minutes with mild sleep fragmentation noted. He had an increased percentage of stage 2 sleep, absence of slow wave sleep and increased REM sleep at 34.8% with a mildly reduced REM latency of 51.5 minutes. He had no significant PLMS, EKG or EEG changes. Average oxygen saturation was 94%, nadir was 90%. CPAP was titrated from 5 cm to 8 cm. AHI was 0 per hour on a pressure of 7 cm. Based on his test results I prescribed CPAP therapy for home use.   I reviewed his CPAP compliance data from 01/09/2015 through 02/07/2015 which is a total of 30 days during which time he used his machine 27 days with percent used days greater than 4 hours at 80%, indicating very good compliance with an average usage of 4 hours and 52 minutes, residual AHI low at 0.7 per hour, leak at times high with the 95th percentile at 28.7 L/m on a pressure of 7 cm with EPR of 1.   07/20/2014: He reports snoring,  has multiple nighttime awakenings, has nocturia and excessive daytime somnolence. His ESS is 20/24. His snoring can be loud according to his wife, who provides most of his history. His bedtime varies. He may be in bed around 10 PM but typically does not fall asleep until late at night. He watches TV in bed. He may fall asleep around 2 AM. He goes to the bathroom multiple times each night, an average of 5 times per night. He denies morning headaches. He's currently not working. He works in Psychologist, educational at [employer]. He works 12 hour shifts, from 7 AM to 7 PM. He works in [place]. He lives in [place]. He denies restless leg symptoms.  He's not known to twitch in his sleep according to his wife. He denies peripheral edema. He does not drink caffeine every day. He denies alcohol consumption. He quit smoking in April 2016. His rise time varies. He may be up by 5 AM.  His Past Medical History Is Significant For: Past Medical History:  Diagnosis Date  . Abnormal ultrasound of carotid artery 04/2014   no significant obstruction  . Ataxia 04/2014  . Diabetes ([place])   . EKG abnormalities 04/2014   poor R wave progression, NSR  . Former smoker    25 years x 1.5 ppd, stopped 04/2014  . Gait disturbance 04/2014   s/p stroke  . H/O echocardiogram 04/2014   moderate LVH, 60-65% EF, no valve disease  . History of heart attack   . Hypertensive heart disease 04/2014  . Intraparenchymal hemorrhage of brain [facility]) 05/05/14   hospitalization [facility]  . Obesity   . Prediabetes   . Short-term memory loss 04/2014  . Stroke [facility]) 04/2014    His Past Surgical History Is Significant For: Past Surgical History:  Procedure Laterality Date  . APPENDECTOMY    . CHOLECYSTECTOMY    . CIRCUMCISION    . HEMORRHOID SURGERY      His Family History Is Significant For: Family History  Problem Relation Age of Onset  . Heart disease Mother   . Hypertension Mother   . Sudden death Mother   . Hypertension  Brother   . Aneurysm Paternal Grandmother     His Social History Is Significant For: Social History   Socioeconomic History  . Marital status: Married    Spouse name: [employer]  . Number of children: 2  . Years of education: 60  . Highest education level: Not on file  Occupational History  . Occupation: Retired   Tobacco Use  . Smoking status: Former Smoker    Packs/day: 1.50    Years: 25.00    Pack years: 32.50    Quit date: 05/05/2014    Years since quitting: 5.3  . Smokeless tobacco: Never Used  Vaping Use  . Vaping Use: Never used  Substance and Sexual Activity  . Alcohol use: No    Alcohol/week: 0.0 standard drinks  . Drug use: No  . Sexual activity: Not on file  Other Topics Concern  . Not on file  Social History Narrative   Lives at home with wife   Married to [employer], non denominational, works at [employer] in [place], [place].  From [place], but moved to [place] 2016.     Caffeine use: Drinks tea occass   Social Determinants of Health   Financial Resource Strain:   . Difficulty of Paying Living Expenses:   Food Insecurity:   . Worried About Charity fundraiser in the Last Year:   . Arboriculturist in the Last Year:   Transportation Needs:   . Film/video editor (Medical):   Marland Kitchen Lack of Transportation (Non-Medical):   Physical Activity:   . Days of Exercise per Week:   . Minutes of Exercise per Session:   Stress:   . Feeling of Stress :   Social Connections:   . Frequency of Communication with Friends and Family:   . Frequency of Social Gatherings with Friends and Family:   . Attends Religious Services:   . Active Member of Clubs or Organizations:   . Attends Archivist Meetings:   Marland Kitchen Marital Status:  His Allergies Are:  No Known Allergies:   Her Current Medications Are:  Outpatient Encounter Medications as of 09/16/2019  Medication Sig  . amLODipine (NORVASC) 10 MG tablet Take 1 tablet (10 mg total) by mouth daily.  .  blood glucose meter kit and supplies KIT Dispense based on patient and insurance preference. Check fasting blood sugar once daily ICD10 R73.03  . glucose blood test strip Use as instructed to monitor FSBS 1x daily. Dx: R73.09  . Insulin Pen Needle 32G X 4 MM MISC Use as directed to inject Victoza SQ QD.  Marland Kitchen labetalol (NORMODYNE) 200 MG tablet TAKE 1 TABLET(200 MG) BY MOUTH TWICE DAILY  . Lancets (ONETOUCH ULTRASOFT) lancets Use as instructed  . losartan (COZAAR) 100 MG tablet Take 1 tablet (100 mg total) by mouth daily.  . Semaglutide,0.25 or 0.5MG/DOS, (OZEMPIC, 0.25 OR 0.5 MG/DOSE,) 2 MG/1.5ML SOPN Inject 0.375 mLs (0.5 mg total) into the skin once a week.  . spironolactone (ALDACTONE) 50 MG tablet TAKE 1 TABLET(50 MG) BY MOUTH DAILY  . tadalafil (CIALIS) 20 MG tablet Take 1 tablet (20 mg total) by mouth as needed.  . Vitamin D, Ergocalciferol, (DRISDOL) 1.25 MG (50000 UNIT) CAPS capsule Take 1 capsule (50,000 Units total) by mouth every 7 (seven) days.   No facility-administered encounter medications on file as of 09/16/2019.  :  Review of Systems:  Out of a complete 14 point review of systems, all are reviewed and negative with the exception of these symptoms as listed below:  Review of Systems  Neurological:       Here for 1 year f/u on cpap. Reports he has been doing well. No issues to report on his machine.    Objective:  Neurological Exam  Physical Exam Physical Examination:   Vitals:   09/16/19 0956  BP: (!) 146/86  Pulse: 64  SpO2: 97%    General Examination: The patient is a very pleasant 59 y.o. male in no acute distress. He appears well-developed and well-nourished and well groomed.   HEENT:Normocephalic, atraumatic, pupils are equal, round and reactive to light, tracking is good without limitation to gaze excursion or nystagmus noted. Normal smooth pursuit is noted. Hearing is grossly intact. Face is symmetric with normal facial animation, speech isclear,no  hypophonia. There is no lip, neck/head, jaw or voice tremor. Oropharynx exam reveals:stable findings,marked airway crowding. Tongue protrudes centrally and palate elevates symmetrically. No carotid bruits.  Chest:Clear to auscultation without wheezing, rhonchi or crackles noted.  Heart:S1+S2+0, regular and normal without murmurs, rubs or gallops noted.   Abdomen:Soft, non-tender and non-distended with normal bowel sounds appreciated on auscultation.  Extremities:Thereare no new findings, no edema.  Skin: Warm and dry without trophic changes noted.   Musculoskeletal: exam reveals no obvious joint deformities, tenderness or joint swelling or erythema.   Neurologically:  Mental status: The patient is awake, alert and oriented in all 4 spheres. His immediate and remote memory, attention, language skills and fund of knowledge are appropriate. There is no evidence of aphasia, agnosia, apraxia or anomia. Speech is clear with normal prosody and enunciation. Thought process is linear. Mood and affectarenormal.  Cranial nerves II - XII are as described above under HEENT exam. Motor exam: Normal bulk, strength and tone is noted. There is no tremor or rebound. Romberg is negativeexcept for minimal sway. Fine motor skills are grossly intact.  Sensory exam: intact to light touch.  Gait, station and balance: He stands without difficulty. Posture is age-appropriate. Gait shows normal stride length and  normal pace. No problems turning are noted.   Assessment and Plan:   In summary, Brad [name] is a very pleasant17 year old male with an underlying medical history ofhypertension, hyperlipidemia, lacunar strokes, hemorrhagic stroke in April 2016, and obesity, whopresents for follow-up consultation of his obstructive sleep apnea,well established onCPAP therapy at 7 cmwith ongoing good results reported and excellent compliance. He had a baseline sleep study in August 2016,  and a subsequent CPAP titration study in September 2016 and has since then establish treatment at home. He has been working on weight loss, has gone back to the gym.  In addition, he has establish care with medical weight management. He is up-to-date with PAP supplies typically.  I renewed his prescription which we will send to his DME company.  He is commended for his treatment adherence with his CPAP and advised to follow-up routinely in 1 year.  I answered all his questions today and he was in agreement. I spent 20 minutes in total face-to-face time and in reviewing records during pre-charting, more than 50% of which was spent in counseling and coordination of care, reviewing test results, reviewing medications and treatment regimen and/or in discussing or reviewing the diagnosis of OSA, the prognosis and treatment options. Pertinent laboratory and imaging test results that were available during this visit with the patient were reviewed by me and considered in my medical decision making (see chart for details).

## 2019-09-29 ENCOUNTER — Other Ambulatory Visit: Payer: Self-pay

## 2019-09-29 ENCOUNTER — Encounter (INDEPENDENT_AMBULATORY_CARE_PROVIDER_SITE_OTHER): Payer: Self-pay | Admitting: Family Medicine

## 2019-09-29 ENCOUNTER — Ambulatory Visit (INDEPENDENT_AMBULATORY_CARE_PROVIDER_SITE_OTHER): Payer: Medicare Other | Admitting: Family Medicine

## 2019-09-29 VITALS — BP 117/76 | HR 66 | Temp 98.1°F | Ht >= 80 in | Wt 328.0 lb

## 2019-09-29 DIAGNOSIS — E1169 Type 2 diabetes mellitus with other specified complication: Secondary | ICD-10-CM | POA: Diagnosis not present

## 2019-09-29 DIAGNOSIS — N528 Other male erectile dysfunction: Secondary | ICD-10-CM | POA: Diagnosis not present

## 2019-09-29 DIAGNOSIS — Z6837 Body mass index (BMI) 37.0-37.9, adult: Secondary | ICD-10-CM | POA: Diagnosis not present

## 2019-09-29 MED ORDER — TADALAFIL 20 MG PO TABS: 20.0000 mg | ORAL_TABLET | ORAL | 0 refills | Status: DC | PRN

## 2019-09-29 MED ORDER — OZEMPIC (1 MG/DOSE) 4 MG/3ML ~~LOC~~ SOPN: 1.0000 mg | PEN_INJECTOR | SUBCUTANEOUS | 0 refills | Status: DC

## 2019-09-29 NOTE — Progress Notes (Signed)
Chief Complaint:   OBESITY Brad [name] is here to discuss his progress with his obesity treatment plan along with follow-up of his obesity related diagnoses. Brad [name] is on the Category 1 Plan and states he is following his eating plan approximately 65% of the time. Brad [name] states he is doing cardio for 60-75 minutes 5 times per week.  Today's visit was #: 8 Starting weight: 334 lbs Starting date: 04/07/2019 Today's weight: 328 lbs Today's date: 09/29/2019 Total lbs lost to date: 6 lbs Total lbs lost since last in-office visit: 5 lbs  Interim History: Brad [name] says he has been going to the gym twice daily sometimes.  He is doing cardio and HIIT.  He has stopped night snacking.   Assessment/Plan:   1. Other male erectile dysfunction I have sent in a refill as a courtesy. Further refills will be completed by PCP.  - tadalafil (CIALIS) 20 MG tablet; Take 1 tablet (20 mg total) by mouth as needed.  Dispense: 10 tablet; Refill: 0  2. Type 2 diabetes mellitus with other specified complication, without long-term current use of insulin (HCC) The current medical regimen is effective;  continue present plan and medications.  2. Class 2 severe obesity with serious comorbidity and body mass index (BMI) of 37.0 to 37.9 in adult, unspecified obesity type [facility]) The current medical regimen is effective;  continue present plan and medications.  - Semaglutide, 1 MG/DOSE, (OZEMPIC, 1 MG/DOSE,) 4 MG/3ML SOPN; Inject 0.75 mLs (1 mg total) into the skin once a week.  Dispense: 3 mL; Refill: 0  Brad [name] is currently in the action stage of change. As such, his goal is to continue with weight loss efforts. He has agreed to the Category 1 Plan.   Exercise goals: For substantial health benefits, adults should do at least 150 minutes (2 hours and 30 minutes) a week of moderate-intensity, or 75 minutes (1 hour and 15 minutes) a week of vigorous-intensity aerobic physical activity, or an equivalent combination of  moderate- and vigorous-intensity aerobic activity. Aerobic activity should be performed in episodes of at least 10 minutes, and preferably, it should be spread throughout the week.  Behavioral modification strategies: increasing lean protein intake.  Brad [name] has agreed to follow-up with our clinic in 2-3 weeks. He was informed of the importance of frequent follow-up visits to maximize his success with intensive lifestyle modifications for his multiple health conditions.   Objective:   Blood pressure 117/76, pulse 66, temperature 98.1 F (36.7 C), temperature source Oral, height 6\' 8"  (2.032 m), weight (!) 328 lb (148.8 kg), SpO2 99 %. Body mass index is 36.03 kg/m.  General: Cooperative, alert, well developed, in no acute distress. HEENT: Conjunctivae and lids unremarkable. Cardiovascular: Regular rhythm.  Lungs: Normal work of breathing. Neurologic: No focal deficits.   Lab Results  Component Value Date   CREATININE 1.06 04/07/2019   BUN 11 04/07/2019   NA 141 04/07/2019   K 4.6 04/07/2019   CL 105 04/07/2019   CO2 24 04/07/2019   Lab Results  Component Value Date   ALT 23 04/07/2019   AST 17 04/07/2019   ALKPHOS 54 04/07/2019   BILITOT 0.5 04/07/2019   Lab Results  Component Value Date   HGBA1C 6.7 (H) 07/06/2019   HGBA1C 6.3 (H) 04/07/2019   HGBA1C 9.5 (H) 11/25/2018   HGBA1C 6.6 (H) 02/18/2018   HGBA1C 6.0 (H) 06/07/2017   Lab Results  Component Value Date   TSH 1.260 04/07/2019   Lab Results  Component  Value Date   CHOL 103 04/07/2019   HDL 41 04/07/2019   LDLCALC 52 04/07/2019   TRIG 35 04/07/2019   CHOLHDL 2.5 04/07/2019   Lab Results  Component Value Date   WBC 5.5 04/07/2019   HGB 13.9 04/07/2019   HCT 41.4 04/07/2019   MCV 88 04/07/2019   PLT 264 04/07/2019   Obesity Behavioral Intervention:   Approximately 15 minutes were spent on the discussion below.  ASK: We discussed the diagnosis of obesity with Brad [name] today and Brad [name] agreed to give  [name] permission to discuss obesity behavioral modification therapy today.  ASSESS: Brad [name] has the diagnosis of obesity and his BMI today is 37.0. Brad [name] is in the action stage of change.   ADVISE: Brad [name] was educated on the multiple health risks of obesity as well as the benefit of weight loss to improve his health. He was advised of the need for long term treatment and the importance of lifestyle modifications to improve his current health and to decrease his risk of future health problems.  AGREE: Multiple dietary modification options and treatment options were discussed and Brad [name] agreed to follow the recommendations documented in the above note.  ARRANGE: Brad [name] was educated on the importance of frequent visits to treat obesity as outlined per CMS and USPSTF guidelines and agreed to schedule his next follow up appointment today.  Attestation Statements:   Reviewed by clinician on day of visit: allergies, medications, problem list, medical history, surgical history, family history, social history, and previous encounter notes.  I, Insurance claims handler, CMA, am acting as transcriptionist for Helane Rima, DO  I have reviewed the above documentation for accuracy and completeness, and I agree with the above. Helane Rima, DO

## 2019-10-20 ENCOUNTER — Ambulatory Visit (INDEPENDENT_AMBULATORY_CARE_PROVIDER_SITE_OTHER): Payer: Medicare Other | Admitting: Family Medicine

## 2019-10-20 ENCOUNTER — Other Ambulatory Visit: Payer: Self-pay

## 2019-10-20 ENCOUNTER — Encounter (INDEPENDENT_AMBULATORY_CARE_PROVIDER_SITE_OTHER): Payer: Self-pay | Admitting: Family Medicine

## 2019-10-20 VITALS — BP 135/83 | HR 65 | Temp 98.0°F | Ht >= 80 in | Wt 324.0 lb

## 2019-10-20 DIAGNOSIS — Z9989 Dependence on other enabling machines and devices: Secondary | ICD-10-CM

## 2019-10-20 DIAGNOSIS — G4733 Obstructive sleep apnea (adult) (pediatric): Secondary | ICD-10-CM

## 2019-10-20 DIAGNOSIS — E1169 Type 2 diabetes mellitus with other specified complication: Secondary | ICD-10-CM

## 2019-10-20 DIAGNOSIS — E785 Hyperlipidemia, unspecified: Secondary | ICD-10-CM

## 2019-10-20 DIAGNOSIS — Z6837 Body mass index (BMI) 37.0-37.9, adult: Secondary | ICD-10-CM | POA: Diagnosis not present

## 2019-10-20 MED ORDER — OZEMPIC (1 MG/DOSE) 4 MG/3ML ~~LOC~~ SOPN: 1.0000 mg | PEN_INJECTOR | SUBCUTANEOUS | 0 refills | Status: DC

## 2019-10-22 NOTE — Progress Notes (Signed)
Chief Complaint:   OBESITY Brad [name] is here to discuss his progress with his obesity treatment plan along with follow-up of his obesity related diagnoses. Brad [name] is on the Category 1 Plan and states he is following his eating plan approximately 65% of the time. Brad [name] states he is doing cardio for 90 minutes 5-6 times per week.  Today's visit was #: 9 Starting weight: 334 lbs Starting date: 04/07/2019 Today's weight: 324 lbs Today's date: 10/20/2019 Total lbs lost to date: 10 lbs Total lbs lost since last in-office visit: 4 lbs Total weight loss percentage to date: -2.99%  Interim History:  Brad [name] says that he is exercising regularly.  He is happy with his progress.  He has had his COVID vaccinations and says that he wants his flu vaccine.   Assessment/Plan:   1. Type 2 diabetes mellitus with other specified complication, without long-term current use of insulin (HCC) Diabetes Mellitus: reasonably well controlled Issues reviewed with him: blood sugar goals, complications of diabetes mellitus, hypoglycemia prevention and treatment, exercise, nutrition, and carbohydrate counting.   Lab Results  Component Value Date   HGBA1C 6.7 (H) 07/06/2019   HGBA1C 6.3 (H) 04/07/2019   HGBA1C 9.5 (H) 11/25/2018   Lab Results  Component Value Date   MICROALBUR 0.3 02/06/2017   LDLCALC 52 04/07/2019   CREATININE 1.06 04/07/2019   2. OSA on CPAP Brad [name] has a diagnosis of sleep apnea. He reports that he is using a CPAP regularly. Will continue to monitor symptoms as they relate to his weight loss journey. This issue directly impacts care plan for optimization of BMI and metabolic health as it impacts the patient's ability to make lifestyle changes.  3. Hyperlipidemia associated with type 2 diabetes mellitus (HCC) He is not on a statin.  LDL is low, but discussed recommendation for statin for all diabetics for cardiovascular event reduction. He will discuss this with his PCP going forward.  Lab  Results  Component Value Date   ALT 23 04/07/2019   AST 17 04/07/2019   ALKPHOS 54 04/07/2019   BILITOT 0.5 04/07/2019   Lab Results  Component Value Date   CHOL 103 04/07/2019   HDL 41 04/07/2019   LDLCALC 52 04/07/2019   TRIG 35 04/07/2019   CHOLHDL 2.5 04/07/2019   4. Class 2 severe obesity with serious comorbidity and body mass index (BMI) of 37.0 to 37.9 in adult, unspecified obesity type Brad [name])  The current medical regimen is effective;  continue present plan and medications. - Semaglutide, 1 MG/DOSE, (OZEMPIC, 1 MG/DOSE,) 4 MG/3ML SOPN; Inject 1 mg into the skin once a week.  Dispense: 9 mL; Refill: 0  Brad [name] is currently in the action stage of change. As such, his goal is to continue with weight loss efforts. He has agreed to the Category 1 Plan.   Exercise goals: For substantial health benefits, adults should do at least 150 minutes (2 hours and 30 minutes) a week of moderate-intensity, or 75 minutes (1 hour and 15 minutes) a week of vigorous-intensity aerobic physical activity, or an equivalent combination of moderate- and vigorous-intensity aerobic activity. Aerobic activity should be performed in episodes of at least 10 minutes, and preferably, it should be spread throughout the week.  Behavioral modification strategies: increasing water intake.  Brad [name] has agreed to follow-up with our clinic in 3 weeks. He was informed of the importance of frequent follow-up visits to maximize his success with intensive lifestyle modifications for his multiple health conditions.   Objective:  Blood pressure 135/83, pulse 65, temperature 98 F (36.7 C), temperature source Oral, height 6\' 8"  (2.032 m), weight (!) 324 lb (147 kg), SpO2 99 %. Body mass index is 35.59 kg/m.  General: Cooperative, alert, well developed, in no acute distress. HEENT: Conjunctivae and lids unremarkable. Cardiovascular: Regular rhythm.  Lungs: Normal work of breathing. Neurologic: No focal deficits.   Lab  Results  Component Value Date   CREATININE 1.06 04/07/2019   BUN 11 04/07/2019   NA 141 04/07/2019   K 4.6 04/07/2019   CL 105 04/07/2019   CO2 24 04/07/2019   Lab Results  Component Value Date   ALT 23 04/07/2019   AST 17 04/07/2019   ALKPHOS 54 04/07/2019   BILITOT 0.5 04/07/2019   Lab Results  Component Value Date   HGBA1C 6.7 (H) 07/06/2019   HGBA1C 6.3 (H) 04/07/2019   HGBA1C 9.5 (H) 11/25/2018   HGBA1C 6.6 (H) 02/18/2018   HGBA1C 6.0 (H) 06/07/2017   Lab Results  Component Value Date   TSH 1.260 04/07/2019   Lab Results  Component Value Date   CHOL 103 04/07/2019   HDL 41 04/07/2019   LDLCALC 52 04/07/2019   TRIG 35 04/07/2019   CHOLHDL 2.5 04/07/2019   Lab Results  Component Value Date   WBC 5.5 04/07/2019   HGB 13.9 04/07/2019   HCT 41.4 04/07/2019   MCV 88 04/07/2019   PLT 264 04/07/2019   Obesity Behavioral Intervention:   Approximately 15 minutes were spent on the discussion below.  ASK: We discussed the diagnosis of obesity with Brad [name] today and Brad [name] agreed to give 06/07/2019 permission to discuss obesity behavioral modification therapy today.  ASSESS: Brad [name] has the diagnosis of obesity and his BMI today is 35.6. Brad [name] is in the action stage of change.   ADVISE: Brad [name] was educated on the multiple health risks of obesity as well as the benefit of weight loss to improve his health. He was advised of the need for long term treatment and the importance of lifestyle modifications to improve his current health and to decrease his risk of future health problems.  AGREE: Multiple dietary modification options and treatment options were discussed and Brad [name] agreed to follow the recommendations documented in the above note.  ARRANGE: Brad [name] was educated on the importance of frequent visits to treat obesity as outlined per CMS and USPSTF guidelines and agreed to schedule his next follow up appointment today.  Attestation Statements:   Reviewed by  clinician on day of visit: allergies, medications, problem list, medical history, surgical history, family history, social history, and previous encounter notes.  I, Briscoe Burns, CMA, am acting as transcriptionist for Insurance claims handler, DO  I have reviewed the above documentation for accuracy and completeness, and I agree with the above. Helane Rima, DO

## 2019-11-17 ENCOUNTER — Ambulatory Visit (INDEPENDENT_AMBULATORY_CARE_PROVIDER_SITE_OTHER): Payer: Medicare Other | Admitting: Family Medicine

## 2019-11-17 ENCOUNTER — Other Ambulatory Visit: Payer: Self-pay

## 2019-11-17 ENCOUNTER — Encounter (INDEPENDENT_AMBULATORY_CARE_PROVIDER_SITE_OTHER): Payer: Self-pay | Admitting: Family Medicine

## 2019-11-17 VITALS — BP 156/76 | HR 68 | Temp 98.1°F | Ht >= 80 in | Wt 320.0 lb

## 2019-11-17 DIAGNOSIS — E559 Vitamin D deficiency, unspecified: Secondary | ICD-10-CM

## 2019-11-17 DIAGNOSIS — N528 Other male erectile dysfunction: Secondary | ICD-10-CM

## 2019-11-17 DIAGNOSIS — I10 Essential (primary) hypertension: Secondary | ICD-10-CM | POA: Diagnosis not present

## 2019-11-17 DIAGNOSIS — E1169 Type 2 diabetes mellitus with other specified complication: Secondary | ICD-10-CM | POA: Diagnosis not present

## 2019-11-17 DIAGNOSIS — Z6835 Body mass index (BMI) 35.0-35.9, adult: Secondary | ICD-10-CM

## 2019-11-18 MED ORDER — VITAMIN D (ERGOCALCIFEROL) 1.25 MG (50000 UNIT) PO CAPS: 50000.0000 [IU] | ORAL_CAPSULE | ORAL | 0 refills | Status: DC

## 2019-11-18 MED ORDER — TADALAFIL 20 MG PO TABS: 20.0000 mg | ORAL_TABLET | ORAL | 0 refills | Status: DC | PRN

## 2019-11-18 NOTE — Progress Notes (Signed)
Chief Complaint:   OBESITY Brad [name] is here to discuss his progress with his obesity treatment plan along with follow-up of his obesity related diagnoses.   Today's visit was #: 10 Starting weight: 334 lbs Starting date: 04/07/2019 Today's weight: 320 lbs Today's date: 11/17/2019 Total lbs lost to date: 14 lbs Body mass index is 35.15 kg/m.  Total weight loss percentage to date: -4.19%  Interim History: Brad [name] is very happy with his weight loss. His bilateral knee arthralgias have improved with weight loss.  Nutrition Plan: Category 3 for 60% of the time. Anti-obesity medications: Ozempic. Reported side effects: None. Hunger is well controlled controlled. Cravings are well controlled controlled.  Activity: Cardio for 60+ minutes 6 times per week Sleep: Sleep is restful.   Assessment/Plan:   1. Vitamin D deficiency Current vitamin D is 30.4, tested on 04/07/2019. Not at goal. Optimal goal > 50 ng/dL.   Plan: Continue Vitamin D @50 ,000 IU every week with follow-up for routine testing of Vitamin D at least 2-3 times per year to avoid over-replacement.  - Refill Vitamin D, Ergocalciferol, (DRISDOL) 1.25 MG (50000 UNIT) CAPS capsule; Take 1 capsule (50,000 Units total) by mouth every 7 (seven) days.  Dispense: 4 capsule; Refill: 0  2. Type 2 diabetes mellitus with other specified complication, without long-term current use of insulin (HCC) Diabetes Mellitus: stable. Medication: Ozempic. He will continue to focus on protein-rich, low simple carbohydrate foods. We reviewed the importance of hydration, regular exercise for stress reduction, and restorative sleep.   Lab Results  Component Value Date   HGBA1C 6.7 (H) 07/06/2019   HGBA1C 6.3 (H) 04/07/2019   HGBA1C 9.5 (H) 11/25/2018   Lab Results  Component Value Date   MICROALBUR 0.3 02/06/2017   LDLCALC 52 04/07/2019   CREATININE 1.06 04/07/2019   3. Other male erectile dysfunction Brad [name] is taking Cialis and requests a  refill. The current medical regimen is effective;  continue present plan and medications.  - Refill tadalafil (CIALIS) 20 MG tablet; Take 1 tablet (20 mg total) by mouth as needed.  Dispense: 10 tablet; Refill: 0  4. Essential hypertension Elevated today, but generally at goal. Medications: Norvasc 10 mg daily, labetalol 200 mg twice daily, Cozaar 100 mg daily, and spironolactone 50 mg daily. Plan: Monitor home BP. Diet: Avoid buying foods that are: processed, frozen, or prepackaged to avoid excess salt. Recheck BP at next visit. The patient understands monitoring parameters and red flags.    BP Readings from Last 3 Encounters:  11/17/19 (!) 156/76  10/20/19 135/83  09/29/19 117/76   Lab Results  Component Value Date   CREATININE 1.06 04/07/2019   5. Class 2 severe obesity due to excess calories with serious comorbidity and body mass index (BMI) of 35.0 to 35.9 in adult [facility])  Brad [name] is currently in the action stage of change. As such, his goal is to continue with weight loss efforts. The current medical regimen is effective;  continue present plan and medications.  Nutrition goals: He has agreed to the Category 3 Plan.   Exercise goals: For substantial health benefits, adults should do at least 150 minutes (2 hours and 30 minutes) a week of moderate-intensity, or 75 minutes (1 hour and 15 minutes) a week of vigorous-intensity aerobic physical activity, or an equivalent combination of moderate- and vigorous-intensity aerobic activity. Aerobic activity should be performed in episodes of at least 10 minutes, and preferably, it should be spread throughout the week.  Behavioral modification strategies: increasing lean  protein intake, decreasing simple carbohydrates, increasing vegetables and increasing water intake.  [name] has agreed to follow-up with our clinic in 3 weeks. He was informed of the importance of frequent follow-up visits to maximize his success with intensive lifestyle  modifications for his multiple health conditions.   Objective:   Blood pressure (!) 156/76, pulse 68, temperature 98.1 F (36.7 C), temperature source Oral, height 6\' 8"  (2.032 m), weight (!) 320 lb (145.2 kg), SpO2 99 %. Body mass index is 35.15 kg/m.  General: Cooperative, alert, well developed, in no acute distress. HEENT: Conjunctivae and lids unremarkable. Cardiovascular: Regular rhythm.  Lungs: Normal work of breathing. Neurologic: No focal deficits.   Lab Results  Component Value Date   CREATININE 1.06 04/07/2019   BUN 11 04/07/2019   NA 141 04/07/2019   K 4.6 04/07/2019   CL 105 04/07/2019   CO2 24 04/07/2019   Lab Results  Component Value Date   ALT 23 04/07/2019   AST 17 04/07/2019   ALKPHOS 54 04/07/2019   BILITOT 0.5 04/07/2019   Lab Results  Component Value Date   HGBA1C 6.7 (H) 07/06/2019   HGBA1C 6.3 (H) 04/07/2019   HGBA1C 9.5 (H) 11/25/2018   HGBA1C 6.6 (H) 02/18/2018   HGBA1C 6.0 (H) 06/07/2017   Lab Results  Component Value Date   TSH 1.260 04/07/2019   Lab Results  Component Value Date   CHOL 103 04/07/2019   HDL 41 04/07/2019   LDLCALC 52 04/07/2019   TRIG 35 04/07/2019   CHOLHDL 2.5 04/07/2019   Lab Results  Component Value Date   WBC 5.5 04/07/2019   HGB 13.9 04/07/2019   HCT 41.4 04/07/2019   MCV 88 04/07/2019   PLT 264 04/07/2019   Attestation Statements:   Reviewed by clinician on day of visit: allergies, medications, problem list, medical history, surgical history, family history, social history, and previous encounter notes.  I, 06/07/2019, CMA, am acting as transcriptionist for Insurance claims handler, DO  I have reviewed the above documentation for accuracy and completeness, and I agree with the above. Helane Rima, DO

## 2019-11-23 MED ORDER — TADALAFIL 20 MG PO TABS: 20.0000 mg | ORAL_TABLET | ORAL | 4 refills | Status: DC | PRN

## 2019-11-24 ENCOUNTER — Other Ambulatory Visit: Payer: Self-pay

## 2019-11-24 ENCOUNTER — Encounter: Payer: Self-pay | Admitting: Cardiovascular Disease

## 2019-11-24 ENCOUNTER — Ambulatory Visit: Payer: Medicare Other | Admitting: Cardiovascular Disease

## 2019-11-24 VITALS — BP 136/78 | HR 70 | Ht >= 80 in | Wt 323.8 lb

## 2019-11-24 DIAGNOSIS — I214 Non-ST elevation (NSTEMI) myocardial infarction: Secondary | ICD-10-CM | POA: Diagnosis not present

## 2019-11-24 DIAGNOSIS — I10 Essential (primary) hypertension: Secondary | ICD-10-CM

## 2019-11-24 LAB — BASIC METABOLIC PANEL
BUN/Creatinine Ratio: 14 (ref 9–20)
BUN: 16 mg/dL (ref 6–24)
CO2: 22 mmol/L (ref 20–29)
Calcium: 9.4 mg/dL (ref 8.7–10.2)
Chloride: 106 mmol/L (ref 96–106)
Creatinine, Ser: 1.18 mg/dL (ref 0.76–1.27)
GFR calc Af Amer: 79 mL/min/{1.73_m2} (ref >59)
GFR calc non Af Amer: 68 mL/min/{1.73_m2} (ref >59)
Glucose: 108 mg/dL — ABNORMAL HIGH (ref 65–99)
Potassium: 4.4 mmol/L (ref 3.5–5.2)
Sodium: 140 mmol/L (ref 134–144)

## 2019-11-24 MED ORDER — SPIRONOLACTONE 50 MG PO TABS: ORAL_TABLET | ORAL | 3 refills | Status: DC

## 2019-11-24 NOTE — Progress Notes (Signed)
Cardiology Office Note   Date:  11/24/2019   ID:  Brad [name], DOB [birth year], MRN [id number]  PCP:  Alycia Rossetti, MD  Cardiologist:   Mertie Moores, MD   Chief Complaint  Patient presents with  . Hypertension   Problem list: 1. 1. Malignant hypertension - Echo from the past  shows normal left ventricular  systolic function with EF of 60-65% with moderate LVH 2. Intracranial hemorrhage caused by malignant hypertension 3. Non-ST segment elevation myocardial infarction-in the setting of malignant hypertension,  Echo shows normal LF function with EF of 60-65% with no segmental wall abn.     Brad [name] is a 59 y.o. male who presents for follow up for malignant hypertension and a type 2 non-ST segment myocardial infarction.  He's been doing fairly well from a cardiac point. He's not had any episodes of chest discomfort. He's currently in rehabilitation for intracranial hemorrhage. His blood pressure is much better controlled.  He has lost quite a bit of weight.   Oct. 26, 2016:  No Cp or dyspnea.  BP is fairly well controlled.   May 23, 2015:  [name] is doing well.  BP is well controlled. Is recovering from his CVA  Walking    OCt. 23, 2017:   No CP or dyspnea.  Echo  [facility] [place], May 06, 2014 - Normal left ventricle systolic function - ejection fraction 60-65%. He has concentric left ventricular hypertrophy.  Works out every day   Jan. 23, 2018:  Feeling well.   Works out every day . Goes to the gym every day  Weight today is 329.  Sept. 13, 2018 Wt = 332 [place] is seen today  BP is well controlled.  No CP or dyspnea  Had stopped the Aldactone,  Is back on it now Is ready to go on a diet - HbA1C is up  Labs from 9/11 look good  October 11, 2017: Has been working out regularly  Had lost some weight but has gained some back   November 11, 2018: Brad [name] is seen today for follow-up visit.  He has a history of hypertension.  Has had an intracranial  bleed and a NSTEMI .  Wants to see a nutritionist.  Just getting back to the gym .  Has been walking   November 24, 2019: Artery seen today for follow-up visit.  His blood pressure is well controlled.  Weight today is 232 pounds. He has a history of a non-ST segment elevation myocardial infarction and also has a history of an intracranial bleed. Is exercising regularly .  Is going to [employer] weight loss program.  Knees are feeling better with weigh loss   Past Medical History:  Diagnosis Date  . Abnormal ultrasound of carotid artery 04/2014   no significant obstruction  . Ataxia 04/2014  . Diabetes ([place])   . EKG abnormalities 04/2014   poor R wave progression, NSR  . Former smoker    25 years x 1.5 ppd, stopped 04/2014  . Gait disturbance 04/2014   s/p stroke  . H/O echocardiogram 04/2014   moderate LVH, 60-65% EF, no valve disease  . History of heart attack   . Hypertensive heart disease 04/2014  . Intraparenchymal hemorrhage of brain [facility]) 05/05/14   hospitalization [facility]  . Obesity   . Prediabetes   . Short-term memory loss 04/2014  . Stroke [facility]) 04/2014    Past Surgical History:  Procedure Laterality Date  . APPENDECTOMY    .  CHOLECYSTECTOMY    . CIRCUMCISION    . HEMORRHOID SURGERY       Current Outpatient Medications  Medication Sig Dispense Refill  . amLODipine (NORVASC) 10 MG tablet Take 1 tablet (10 mg total) by mouth daily. 30 tablet 0  . blood glucose meter kit and supplies KIT Dispense based on patient and insurance preference. Check fasting blood sugar once daily ICD10 R73.03 1 each 0  . glucose blood test strip Use as instructed to monitor FSBS 1x daily. Dx: R73.09 100 each 12  . labetalol (NORMODYNE) 200 MG tablet TAKE 1 TABLET(200 MG) BY MOUTH TWICE DAILY 180 tablet 3  . Lancets (ONETOUCH ULTRASOFT) lancets Use as instructed 100 each 12  . losartan (COZAAR) 100 MG tablet Take 1 tablet (100 mg total) by mouth daily. 30 tablet 0  .  Semaglutide, 1 MG/DOSE, (OZEMPIC, 1 MG/DOSE,) 4 MG/3ML SOPN Inject 1 mg into the skin once a week. 9 mL 0  . spironolactone (ALDACTONE) 50 MG tablet TAKE 1 TABLET(50 MG) BY MOUTH DAILY 90 tablet 3  . tadalafil (CIALIS) 20 MG tablet Take 1 tablet (20 mg total) by mouth as needed. 10 tablet 4  . Vitamin D, Ergocalciferol, (DRISDOL) 1.25 MG (50000 UNIT) CAPS capsule Take 1 capsule (50,000 Units total) by mouth every 7 (seven) days. 4 capsule 0   No current facility-administered medications for this visit.    Allergies:   Patient has no known allergies.    Social History:  The patient  reports that he quit smoking about 5 years ago. He has a 37.50 pack-year smoking history. He has never used smokeless tobacco. He reports that he does not drink alcohol and does not use drugs.   Family History:  The patient's family history includes Aneurysm in his paternal grandmother; Heart disease in his mother; Hypertension in his brother and mother; Sudden death in his mother.    ROS:  Please see the history of present illness.   Physical Exam: Blood pressure 136/78, pulse 70, height $RemoveBe'6\' 8"'dmxsmAKDc$  (2.032 m), weight (!) 323 lb 12.8 oz (146.9 kg), SpO2 99 %.  GEN:  Large , middle age man,  Very tall.   NAD  HEENT: Normal NECK: No JVD; No carotid bruits LYMPHATICS: No lymphadenopathy CARDIAC: RRR , no murmurs, rubs, gallops RESPIRATORY:  Clear to auscultation without rales, wheezing or rhonchi  ABDOMEN: Soft, non-tender, non-distended MUSCULOSKELETAL:  No edema; No deformity  SKIN: Warm and dry NEUROLOGIC:  Alert and oriented x 3   EKG:      Recent Labs: 04/07/2019: ALT 23; BUN 11; Creatinine, Ser 1.06; Hemoglobin 13.9; Platelets 264; Potassium 4.6; Sodium 141; TSH 1.260    Lipid Panel    Component Value Date/Time   CHOL 103 04/07/2019 1223   CHOL 84 (L) 10/01/2014 0001   TRIG 35 04/07/2019 1223   TRIG 51 10/01/2014 0001   HDL 41 04/07/2019 1223   HDL 40 10/01/2014 0001   CHOLHDL 2.5 04/07/2019 1223    CHOLHDL 2.8 06/07/2017 0926   VLDL 9 11/21/2015 1124   LDLCALC 52 04/07/2019 1223   LDLCALC 61 06/07/2017 0926   LDLCALC 34 10/01/2014 0001      Wt Readings from Last 3 Encounters:  11/24/19 (!) 323 lb 12.8 oz (146.9 kg)  11/17/19 (!) 320 lb (145.2 kg)  10/20/19 (!) 324 lb (147 kg)      Other studies Reviewed: Additional studies/ records that were reviewed today include: . Review of the above records demonstrates:    ASSESSMENT  AND PLAN:  1.  Malignant hypertension -blood pressure is well controlled.  Continue current medications.  We will check a basic metabolic profile today.  2. Intracranial hemorrhage   -no neurologic signs or symptoms.  3. Non-ST segment elevation myocardial infarction-i he denies having any episodes of chest pain.    Current medicines are reviewed at length with the patient today.  The patient does not have concerns regarding medicines.  The following changes have been made:  no change  Labs/ tests ordered today include:   Orders Placed This Encounter  Procedures  . Basic metabolic panel  . Basic metabolic panel    Disposition:   FU with me in 1 year    Mertie Moores, MD  11/24/2019 9:26 AM    [facility] [address], [place], [place]  [address] Phone: [phone]; Fax: [phone]

## 2019-11-24 NOTE — Patient Instructions (Addendum)
Medication Instructions:  Your physician recommends that you continue on your current medications as directed. Please refer to the Current Medication list given to you today.  *If you need a refill on your cardiac medications before your next appointment, please call your pharmacy*  Lab Work: Your physician recommends that you have lab work today. BMET  Your physician recommends that you return for lab work in: 1 year for BMET before office visit.   If you have labs (blood work) drawn today and your tests are completely normal, you will receive your results only by: Marland Kitchen MyChart Message (if you have MyChart) OR . A paper copy in the mail If you have any lab test that is abnormal or we need to change your treatment, we will call you to review the results.  Follow-Up: At [facility], you and your health needs are our priority.  As part of our continuing mission to provide you with exceptional heart care, we have created designated Provider Care Teams.  These Care Teams include your primary Cardiologist (physician) and Advanced Practice Providers (APPs -  Physician Assistants and Nurse Practitioners) who all work together to provide you with the care you need, when you need it.  We recommend signing up for the patient portal called "MyChart".  Sign up information is provided on this After Visit Summary.  MyChart is used to connect with patients for Virtual Visits (Telemedicine).  Patients are able to view lab/test results, encounter notes, upcoming appointments, etc.  Non-urgent messages can be sent to your provider as well.   To learn more about what you can do with MyChart, go to [email or web address].    Your next appointment:   1 year(s)  The format for your next appointment:   In Person  Provider:   You may see Dr. Elease Hashimoto or one of the following Advanced Practice Providers on your designated Care Team:    Tereso Newcomer, PA-C  [place], [place]

## 2019-12-07 ENCOUNTER — Ambulatory Visit (INDEPENDENT_AMBULATORY_CARE_PROVIDER_SITE_OTHER): Payer: Medicare Other | Admitting: Family Medicine

## 2019-12-14 ENCOUNTER — Other Ambulatory Visit (INDEPENDENT_AMBULATORY_CARE_PROVIDER_SITE_OTHER): Payer: Self-pay | Admitting: Family Medicine

## 2019-12-14 DIAGNOSIS — I152 Hypertension secondary to endocrine disorders: Secondary | ICD-10-CM

## 2019-12-14 NOTE — Telephone Encounter (Signed)
Last seen by Dr. Wallace. 

## 2019-12-16 ENCOUNTER — Ambulatory Visit (INDEPENDENT_AMBULATORY_CARE_PROVIDER_SITE_OTHER): Payer: Medicare Other | Admitting: Family Medicine

## 2019-12-16 ENCOUNTER — Other Ambulatory Visit: Payer: Self-pay

## 2019-12-16 ENCOUNTER — Other Ambulatory Visit: Payer: Self-pay | Admitting: Family Medicine

## 2019-12-16 ENCOUNTER — Encounter (INDEPENDENT_AMBULATORY_CARE_PROVIDER_SITE_OTHER): Payer: Self-pay | Admitting: Family Medicine

## 2019-12-16 VITALS — BP 135/81 | HR 67 | Temp 97.8°F | Ht >= 80 in | Wt 320.0 lb

## 2019-12-16 DIAGNOSIS — E1169 Type 2 diabetes mellitus with other specified complication: Secondary | ICD-10-CM

## 2019-12-16 DIAGNOSIS — E1159 Type 2 diabetes mellitus with other circulatory complications: Secondary | ICD-10-CM

## 2019-12-16 DIAGNOSIS — I152 Hypertension secondary to endocrine disorders: Secondary | ICD-10-CM

## 2019-12-16 DIAGNOSIS — M170 Bilateral primary osteoarthritis of knee: Secondary | ICD-10-CM | POA: Diagnosis not present

## 2019-12-16 DIAGNOSIS — N528 Other male erectile dysfunction: Secondary | ICD-10-CM

## 2019-12-16 DIAGNOSIS — M1711 Unilateral primary osteoarthritis, right knee: Secondary | ICD-10-CM

## 2019-12-16 DIAGNOSIS — Z6835 Body mass index (BMI) 35.0-35.9, adult: Secondary | ICD-10-CM

## 2019-12-16 MED ORDER — AMLODIPINE BESYLATE 10 MG PO TABS: 10.0000 mg | ORAL_TABLET | Freq: Every day | ORAL | 3 refills | Status: DC

## 2019-12-16 MED ORDER — LOSARTAN POTASSIUM 100 MG PO TABS: 100.0000 mg | ORAL_TABLET | Freq: Every day | ORAL | 3 refills | Status: DC

## 2019-12-16 MED ORDER — TADALAFIL 20 MG PO TABS: 20.0000 mg | ORAL_TABLET | ORAL | 6 refills | Status: DC | PRN

## 2019-12-21 NOTE — Progress Notes (Signed)
Chief Complaint:   OBESITY Brad [name] is here to discuss his progress with his obesity treatment plan along with follow-up of his obesity related diagnoses.   Today's visit was #: 11 Starting weight: 334 lbs Starting date: 04/07/2019 Today's weight: 320 lbs Today's date: 12/16/2019 Total lbs lost to date: 14 lbs Body mass index is 35.15 kg/m.  Total weight loss percentage to date: -4.19%  Interim History: Brad [name] is doing well on the plan.  Brad [name] is due for labs at his next visit.  I have reviewed his last office visit note with Dr. [name] Popper.   Nutrition Plan: the Category 1 Plan for 65% of the time.  Anti-obesity medications: Ozempic. Reported side effects: None. Hunger is moderately controlled controlled. Cravings are moderately controlled controlled.  Activity: Cardio and free weights for 60-120 minutes 6 times per week. Sleep: Sleep is restful.   Assessment/Plan:   1. Hypertension associated with type 2 diabetes mellitus (HCC) At goal. Medications: Norvasc and Cozaar. Plan: Avoid buying foods that are: processed, frozen, or prepackaged to avoid excess salt. We will continue to monitor symptoms as they relate to his weight loss journey.  BP Readings from Last 3 Encounters:  12/16/19 135/81  11/24/19 136/78  11/17/19 (!) 156/76   Lab Results  Component Value Date   CREATININE 1.18 11/24/2019   - Refill amLODipine (NORVASC) 10 MG tablet; Take 1 tablet (10 mg total) by mouth daily.  Dispense: 90 tablet; Refill: 3 - Refill losartan (COZAAR) 100 MG tablet; Take 1 tablet (100 mg total) by mouth daily.  Dispense: 90 tablet; Refill: 3  2. Type 2 diabetes mellitus with other specified complication, without long-term current use of insulin (HCC) Diabetes Mellitus: well controlled. Medication: Ozempic. Issues reviewed with him: blood sugar goals, complications of diabetes mellitus, hypoglycemia prevention and treatment, exercise, and nutrition.  Plan:  Will check A1c at next  visit.  Lab Results  Component Value Date   HGBA1C 6.7 (H) 07/06/2019   HGBA1C 6.3 (H) 04/07/2019   HGBA1C 9.5 (H) 11/25/2018   Lab Results  Component Value Date   MICROALBUR 0.3 02/06/2017   LDLCALC 52 04/07/2019   CREATININE 1.18 11/24/2019   3. Other male erectile dysfunction Will refill Cialis today, as per below.  - Refill tadalafil (CIALIS) 20 MG tablet; Take 1 tablet (20 mg total) by mouth as needed.  Dispense: 10 tablet; Refill: 6  4. Primary osteoarthritis of both knees Pain has improved with weight loss. We will continue to monitor symptoms as they relate to his weight loss journey.  5. Class 2 severe obesity due to excess calories with serious comorbidity and body mass index (BMI) of 35.0 to 35.9 in adult [facility])  Course: Brad [name] is currently in the action stage of change. As such, his goal is to continue with weight loss efforts.   Nutrition goals: Brad [name] has agreed to the Category 1 Plan.   Exercise goals: For substantial health benefits, adults should do at least 150 minutes (2 hours and 30 minutes) a week of moderate-intensity, or 75 minutes (1 hour and 15 minutes) a week of vigorous-intensity aerobic physical activity, or an equivalent combination of moderate- and vigorous-intensity aerobic activity. Aerobic activity should be performed in episodes of at least 10 minutes, and preferably, it should be spread throughout the week.  Behavioral modification strategies: increasing lean protein intake, decreasing simple carbohydrates, increasing vegetables, increasing water intake and decreasing liquid calories.  Brad [name] has agreed to follow-up with our clinic in 3-4 weeks. Brad [name]  was informed of the importance of frequent follow-up visits to maximize his success with intensive lifestyle modifications for his multiple health conditions.   Objective:   Blood pressure 135/81, pulse 67, temperature 97.8 F (36.6 C), height 6\' 8"  (2.032 m), weight (!) 320 lb (145.2 kg), SpO2 100  %. Body mass index is 35.15 kg/m.  General: Cooperative, alert, well developed, in no acute distress. HEENT: Conjunctivae and lids unremarkable. Cardiovascular: Regular rhythm.  Lungs: Normal work of breathing. Neurologic: No focal deficits.   Lab Results  Component Value Date   CREATININE 1.18 11/24/2019   BUN 16 11/24/2019   NA 140 11/24/2019   K 4.4 11/24/2019   CL 106 11/24/2019   CO2 22 11/24/2019   Lab Results  Component Value Date   ALT 23 04/07/2019   AST 17 04/07/2019   ALKPHOS 54 04/07/2019   BILITOT 0.5 04/07/2019   Lab Results  Component Value Date   HGBA1C 6.7 (H) 07/06/2019   HGBA1C 6.3 (H) 04/07/2019   HGBA1C 9.5 (H) 11/25/2018   HGBA1C 6.6 (H) 02/18/2018   HGBA1C 6.0 (H) 06/07/2017   No results found for: INSULIN Lab Results  Component Value Date   TSH 1.260 04/07/2019   Lab Results  Component Value Date   CHOL 103 04/07/2019   HDL 41 04/07/2019   LDLCALC 52 04/07/2019   TRIG 35 04/07/2019   CHOLHDL 2.5 04/07/2019   Lab Results  Component Value Date   WBC 5.5 04/07/2019   HGB 13.9 04/07/2019   HCT 41.4 04/07/2019   MCV 88 04/07/2019   PLT 264 04/07/2019   Attestation Statements:   Reviewed by clinician on day of visit: allergies, medications, problem list, medical history, surgical history, family history, social history, and previous encounter notes.  I, 06/07/2019, CMA, am acting as transcriptionist for Insurance claims handler, DO  I have reviewed the above documentation for accuracy and completeness, and I agree with the above. Helane Rima, DO

## 2020-01-07 ENCOUNTER — Ambulatory Visit (INDEPENDENT_AMBULATORY_CARE_PROVIDER_SITE_OTHER): Payer: Medicare Other | Admitting: Family Medicine

## 2020-01-12 ENCOUNTER — Ambulatory Visit (INDEPENDENT_AMBULATORY_CARE_PROVIDER_SITE_OTHER): Payer: Medicare Other | Admitting: Family Medicine

## 2020-01-12 ENCOUNTER — Other Ambulatory Visit: Payer: Self-pay

## 2020-01-12 ENCOUNTER — Encounter (INDEPENDENT_AMBULATORY_CARE_PROVIDER_SITE_OTHER): Payer: Self-pay | Admitting: Family Medicine

## 2020-01-12 VITALS — BP 143/83 | HR 64 | Temp 97.8°F | Ht >= 80 in | Wt 320.0 lb

## 2020-01-12 DIAGNOSIS — E1169 Type 2 diabetes mellitus with other specified complication: Secondary | ICD-10-CM | POA: Diagnosis not present

## 2020-01-12 DIAGNOSIS — N528 Other male erectile dysfunction: Secondary | ICD-10-CM

## 2020-01-12 DIAGNOSIS — E785 Hyperlipidemia, unspecified: Secondary | ICD-10-CM

## 2020-01-12 DIAGNOSIS — E1159 Type 2 diabetes mellitus with other circulatory complications: Secondary | ICD-10-CM | POA: Diagnosis not present

## 2020-01-12 DIAGNOSIS — E559 Vitamin D deficiency, unspecified: Secondary | ICD-10-CM

## 2020-01-12 DIAGNOSIS — I152 Hypertension secondary to endocrine disorders: Secondary | ICD-10-CM

## 2020-01-12 DIAGNOSIS — Z6835 Body mass index (BMI) 35.0-35.9, adult: Secondary | ICD-10-CM | POA: Diagnosis not present

## 2020-01-12 MED ORDER — TADALAFIL 20 MG PO TABS: 20.0000 mg | ORAL_TABLET | ORAL | 0 refills | Status: DC | PRN

## 2020-01-12 NOTE — Progress Notes (Signed)
Chief Complaint:   OBESITY Brad [name] is here to discuss his progress with his obesity treatment plan along with follow-up of his obesity related diagnoses.   Today's visit was #: 12 Starting weight: 334 lbs Starting date: 04/07/2019 Today's weight: 320 lbs Today's date: 01/12/2020 Total lbs lost to date: 14 lbs Body mass index is 35.15 kg/m.  Total weight loss percentage to date: -4.19%  Interim History: Brad [name] says he is doing well.  He was down 3 more pounds before Thanksgiving. Nutrition Plan: the Category 1 Plan for 60% of the time.  Anti-obesity medications: Ozempic 1 mg subcutaneously weely. Reported side effects: None. Hunger is well controlled. Cravings are well controlled.  Activity: Cardio for 60-120 minutes 6 times per week.  Assessment/Plan:   1. Type 2 diabetes mellitus with other specified complication, without long-term current use of insulin (HCC) Diabetes Mellitus: well controlled. Medication: Ozempic 1 mg subcutaneously weekly. Issues reviewed with him: blood sugar goals, complications of diabetes mellitus, hypoglycemia prevention and treatment, exercise, and nutrition.  Lab Results  Component Value Date   HGBA1C 6.7 (H) 07/06/2019   HGBA1C 6.3 (H) 04/07/2019   HGBA1C 9.5 (H) 11/25/2018   Lab Results  Component Value Date   MICROALBUR 0.3 02/06/2017   LDLCALC 52 04/07/2019   CREATININE 1.18 11/24/2019   - CBC with Differential/Platelet - Hemoglobin A1c  2. Hypertension associated with type 2 diabetes mellitus (HCC) Slightly elevated today. Medications: Norvasc and Cozaar.   Plan: Avoid buying foods that are: processed, frozen, or prepackaged to avoid excess salt. We will continue to monitor symptoms as they relate to his weight loss journey.  BP Readings from Last 3 Encounters:  01/12/20 (!) 143/83  12/16/19 135/81  11/24/19 136/78   Lab Results  Component Value Date   CREATININE 1.18 11/24/2019   - Comprehensive metabolic panel  3.  Vitamin D deficiency Not at goal. Current vitamin D is 30.4, tested on 04/07/2019. Optimal goal > 50 ng/dL.   Plan:  [x]   Continue Vitamin D @50 ,000 IU every week. []   Continue home supplement daily. [x]   Follow-up for routine testing of Vitamin D at least 2-3 times per year to avoid over-replacement.  - VITAMIN D 25 Hydroxy (Vit-D Deficiency, Fractures)  4. Hyperlipidemia associated with type 2 diabetes mellitus (HCC) Course: Stable.   Plan: Dietary changes: Increase soluble fiber. Decrease simple carbohydrates. Exercise changes: An average 40 minutes of moderate to vigorous-intensity aerobic activity 3 or 4 times per week.   Lab Results  Component Value Date   CHOL 103 04/07/2019   HDL 41 04/07/2019   LDLCALC 52 04/07/2019   TRIG 35 04/07/2019   CHOLHDL 2.5 04/07/2019   Lab Results  Component Value Date   ALT 23 04/07/2019   AST 17 04/07/2019   ALKPHOS 54 04/07/2019   BILITOT 0.5 04/07/2019   - Lipid panel  5. Other male erectile dysfunction Rizwan is taking Cialis 20 mg as needed. The current medical regimen is effective;  continue present plan and medications.  Plan:  Refill Cialis.  -Refill tadalafil (CIALIS) 20 MG tablet; Take 1 tablet (20 mg total) by mouth as needed.  Dispense: 30 tablet; Refill: 0  6. Class 2 severe obesity due to excess calories with serious comorbidity and body mass index (BMI) of 35.0 to 35.9 in adult [facility])  Course: Brad [name] is currently in the action stage of change. As such, his goal is to continue with weight loss efforts.   Nutrition goals: He has agreed  to the Category 1 Plan.   Exercise goals: For substantial health benefits, adults should do at least 150 minutes (2 hours and 30 minutes) a week of moderate-intensity, or 75 minutes (1 hour and 15 minutes) a week of vigorous-intensity aerobic physical activity, or an equivalent combination of moderate- and vigorous-intensity aerobic activity. Aerobic activity should be performed in episodes of  at least 10 minutes, and preferably, it should be spread throughout the week.  Behavioral modification strategies: increasing lean protein intake, decreasing simple carbohydrates, increasing vegetables and increasing water intake.  Brad [name] has agreed to follow-up with our clinic in 4 weeks. He was informed of the importance of frequent follow-up visits to maximize his success with intensive lifestyle modifications for his multiple health conditions.   Objective:   Blood pressure (!) 143/83, pulse 64, temperature 97.8 F (36.6 C), temperature source Oral, height 6\' 8"  (2.032 m), weight (!) 320 lb (145.2 kg), SpO2 97 %. Body mass index is 35.15 kg/m.  General: Cooperative, alert, well developed, in no acute distress. HEENT: Conjunctivae and lids unremarkable. Cardiovascular: Regular rhythm.  Lungs: Normal work of breathing. Neurologic: No focal deficits.   Lab Results  Component Value Date   CREATININE 1.18 11/24/2019   BUN 16 11/24/2019   NA 140 11/24/2019   K 4.4 11/24/2019   CL 106 11/24/2019   CO2 22 11/24/2019   Lab Results  Component Value Date   ALT 23 04/07/2019   AST 17 04/07/2019   ALKPHOS 54 04/07/2019   BILITOT 0.5 04/07/2019   Lab Results  Component Value Date   HGBA1C 6.7 (H) 07/06/2019   HGBA1C 6.3 (H) 04/07/2019   HGBA1C 9.5 (H) 11/25/2018   HGBA1C 6.6 (H) 02/18/2018   HGBA1C 6.0 (H) 06/07/2017   Lab Results  Component Value Date   TSH 1.260 04/07/2019   Lab Results  Component Value Date   CHOL 103 04/07/2019   HDL 41 04/07/2019   LDLCALC 52 04/07/2019   TRIG 35 04/07/2019   CHOLHDL 2.5 04/07/2019   Lab Results  Component Value Date   WBC 5.5 04/07/2019   HGB 13.9 04/07/2019   HCT 41.4 04/07/2019   MCV 88 04/07/2019   PLT 264 04/07/2019   Obesity Behavioral Intervention:   Approximately 15 minutes were spent on the discussion below.  ASK: We discussed the diagnosis of obesity with Brad [name] today and Brad [name] agreed to give 06/07/2019 permission to  discuss obesity behavioral modification therapy today.  ASSESS: Brad [name] has the diagnosis of obesity and his BMI today is 35.2. Brad [name] is in the action stage of change.   ADVISE: Brad [name] was educated on the multiple health risks of obesity as well as the benefit of weight loss to improve his health. He was advised of the need for long term treatment and the importance of lifestyle modifications to improve his current health and to decrease his risk of future health problems.  AGREE: Multiple dietary modification options and treatment options were discussed and Mandell agreed to follow the recommendations documented in the above note.  ARRANGE: Lanson was educated on the importance of frequent visits to treat obesity as outlined per CMS and USPSTF guidelines and agreed to schedule his next follow up appointment today.  Attestation Statements:   Reviewed by clinician on day of visit: allergies, medications, problem list, medical history, surgical history, family history, social history, and previous encounter notes.  I, Briscoe Burns, CMA, am acting as transcriptionist for Insurance claims handler, DO  I have reviewed the above documentation for  accuracy and completeness, and I agree with the above. Briscoe Deutscher, DO

## 2020-01-13 LAB — COMPREHENSIVE METABOLIC PANEL
ALT: 29 IU/L (ref 0–44)
AST: 22 IU/L (ref 0–40)
Albumin/Globulin Ratio: 1.3 (ref 1.2–2.2)
Albumin: 4 g/dL (ref 3.8–4.9)
Alkaline Phosphatase: 54 IU/L (ref 44–121)
BUN/Creatinine Ratio: 10 (ref 9–20)
BUN: 12 mg/dL (ref 6–24)
Bilirubin Total: 0.7 mg/dL (ref 0.0–1.2)
CO2: 25 mmol/L (ref 20–29)
Calcium: 9 mg/dL (ref 8.7–10.2)
Chloride: 105 mmol/L (ref 96–106)
Creatinine, Ser: 1.23 mg/dL (ref 0.76–1.27)
GFR calc Af Amer: 75 mL/min/{1.73_m2} (ref >59)
GFR calc non Af Amer: 65 mL/min/{1.73_m2} (ref >59)
Globulin, Total: 3 g/dL (ref 1.5–4.5)
Glucose: 83 mg/dL (ref 65–99)
Potassium: 4.7 mmol/L (ref 3.5–5.2)
Sodium: 141 mmol/L (ref 134–144)
Total Protein: 7 g/dL (ref 6.0–8.5)

## 2020-01-13 LAB — CBC WITH DIFFERENTIAL/PLATELET
Basophils Absolute: 0 10*3/uL (ref 0.0–0.2)
Basos: 1 %
EOS (ABSOLUTE): 0.2 10*3/uL (ref 0.0–0.4)
Eos: 3 %
Hematocrit: 41.3 % (ref 37.5–51.0)
Hemoglobin: 14.1 g/dL (ref 13.0–17.7)
Immature Grans (Abs): 0 10*3/uL (ref 0.0–0.1)
Immature Granulocytes: 0 %
Lymphocytes Absolute: 2 10*3/uL (ref 0.7–3.1)
Lymphs: 34 %
MCH: 29.6 pg (ref 26.6–33.0)
MCHC: 34.1 g/dL (ref 31.5–35.7)
MCV: 87 fL (ref 79–97)
Monocytes Absolute: 0.5 10*3/uL (ref 0.1–0.9)
Monocytes: 8 %
Neutrophils Absolute: 3.1 10*3/uL (ref 1.4–7.0)
Neutrophils: 54 %
Platelets: 261 10*3/uL (ref 150–450)
RBC: 4.77 x10E6/uL (ref 4.14–5.80)
RDW: 13.4 % (ref 11.6–15.4)
WBC: 5.8 10*3/uL (ref 3.4–10.8)

## 2020-01-13 LAB — LIPID PANEL
Chol/HDL Ratio: 2.9 ratio (ref 0.0–5.0)
Cholesterol, Total: 125 mg/dL (ref 100–199)
HDL: 43 mg/dL (ref >39)
LDL Chol Calc (NIH): 71 mg/dL (ref 0–99)
Triglycerides: 47 mg/dL (ref 0–149)
VLDL Cholesterol Cal: 11 mg/dL (ref 5–40)

## 2020-01-13 LAB — HEMOGLOBIN A1C
Est. average glucose Bld gHb Est-mCnc: 111 mg/dL
Hgb A1c MFr Bld: 5.5 % (ref 4.8–5.6)

## 2020-01-13 LAB — VITAMIN D 25 HYDROXY (VIT D DEFICIENCY, FRACTURES): Vit D, 25-Hydroxy: 23.9 ng/mL — ABNORMAL LOW (ref 30.0–100.0)

## 2020-02-11 ENCOUNTER — Telehealth (INDEPENDENT_AMBULATORY_CARE_PROVIDER_SITE_OTHER): Payer: Self-pay | Admitting: Family Medicine

## 2020-02-11 NOTE — Telephone Encounter (Signed)
Miachel would like a call concerning his Ozempic, his has been waiting on a refill and the pharmacy says they haven't heard anything back from Dr. Earlene Plater.  Please call him

## 2020-02-12 MED ORDER — OZEMPIC (1 MG/DOSE) 4 MG/3ML ~~LOC~~ SOPN: 1.0000 mg | PEN_INJECTOR | SUBCUTANEOUS | 0 refills | Status: DC

## 2020-02-12 NOTE — Telephone Encounter (Signed)
I am not showing that any request was received, RX sent

## 2020-02-17 ENCOUNTER — Telehealth (INDEPENDENT_AMBULATORY_CARE_PROVIDER_SITE_OTHER): Payer: Medicare Other | Admitting: Family Medicine

## 2020-02-17 ENCOUNTER — Encounter (INDEPENDENT_AMBULATORY_CARE_PROVIDER_SITE_OTHER): Payer: Self-pay | Admitting: Family Medicine

## 2020-02-17 ENCOUNTER — Encounter (INDEPENDENT_AMBULATORY_CARE_PROVIDER_SITE_OTHER): Payer: Self-pay

## 2020-02-17 ENCOUNTER — Other Ambulatory Visit: Payer: Self-pay

## 2020-02-17 DIAGNOSIS — E559 Vitamin D deficiency, unspecified: Secondary | ICD-10-CM

## 2020-02-17 DIAGNOSIS — E1169 Type 2 diabetes mellitus with other specified complication: Secondary | ICD-10-CM

## 2020-02-17 DIAGNOSIS — E1159 Type 2 diabetes mellitus with other circulatory complications: Secondary | ICD-10-CM

## 2020-02-17 DIAGNOSIS — N528 Other male erectile dysfunction: Secondary | ICD-10-CM | POA: Diagnosis not present

## 2020-02-17 DIAGNOSIS — Z6835 Body mass index (BMI) 35.0-35.9, adult: Secondary | ICD-10-CM

## 2020-02-17 DIAGNOSIS — I152 Hypertension secondary to endocrine disorders: Secondary | ICD-10-CM

## 2020-02-18 MED ORDER — TADALAFIL 20 MG PO TABS: 20.0000 mg | ORAL_TABLET | ORAL | 0 refills | Status: DC | PRN

## 2020-02-18 MED ORDER — VITAMIN D (ERGOCALCIFEROL) 1.25 MG (50000 UNIT) PO CAPS: 50000.0000 [IU] | ORAL_CAPSULE | ORAL | 0 refills | Status: DC

## 2020-02-18 MED ORDER — OZEMPIC (1 MG/DOSE) 4 MG/3ML ~~LOC~~ SOPN: 1.0000 mg | PEN_INJECTOR | SUBCUTANEOUS | 0 refills | Status: DC

## 2020-02-18 NOTE — Progress Notes (Signed)
TeleHealth Visit:  Due to the COVID-19 pandemic, this visit was completed with telemedicine (audio/video) technology to reduce patient and provider exposure as well as to preserve personal protective equipment.   Brad [name] has verbally consented to this TeleHealth visit. The patient is located at home, the provider is located at the [facility] office. The participants in this visit include the listed provider and patient and. The visit was conducted today via MyChart.  OBESITY Brad [name] is here to discuss his progress with his obesity treatment plan along with follow-up of his obesity related diagnoses.   Today's visit was #: 13 Starting weight: 334 lbs Starting date: 04/07/2019 Today's date: 02/17/2020  Interim History: Brad [name] says he is doing well.  He is happy with the plan and medications.  He had a delay in getting Ozempic last month.  Nutrition Plan: the Category 1 Plan for 60% of the time.  Anti-obesity medications: Ozempic. Reported side effects: None. Activity: Cardio for 60-120 minutes 6 times per week.  Assessment/Plan:   1. Type 2 diabetes mellitus with other specified complication, without long-term current use of insulin (HCC) Medication: Ozempic 1 mg subcutaneously weekly. Issues reviewed with him: blood sugar goals, complications of diabetes mellitus, hypoglycemia prevention and treatment, exercise, and nutrition.  Lab Results  Component Value Date   HGBA1C 5.5 01/12/2020   HGBA1C 6.7 (H) 07/06/2019   HGBA1C 6.3 (H) 04/07/2019   Lab Results  Component Value Date   MICROALBUR 0.3 02/06/2017   LDLCALC 71 01/12/2020   CREATININE 1.23 01/12/2020   2. Hypertension associated with type 2 diabetes mellitus (HCC) At goal. Medications: amlodipine, labetalol, losartan, and spironolactone.   Plan: Avoid buying foods that are: processed, frozen, or prepackaged to avoid excess salt. We will continue to monitor symptoms as they relate to his weight loss  journey.  BP Readings from Last 3 Encounters:  01/12/20 (!) 143/83  12/16/19 135/81  11/24/19 136/78   Lab Results  Component Value Date   CREATININE 1.23 01/12/2020   3. Vitamin D deficiency Improving, but not optimized. Current vitamin D is 23.9, tested on 01/12/2020. Optimal goal > 50 ng/dL.   Plan:  [x]   Continue Vitamin D @50 ,000 IU every week. []   Continue home supplement daily. [x]   Follow-up for routine testing of Vitamin D at least 2-3 times per year to avoid over-replacement.  4. Other male erectile dysfunction The current medical regimen is effective;  continue present plan and medications. Will refill Cialis today.   5. Class 2 severe obesity due to excess calories with serious comorbidity and body mass index (BMI) of 35.0 to 35.9 in adult Brad [name])  Course: Brad [name] is currently in the action stage of change. As such, his goal is to continue with weight loss efforts.   Nutrition goals: He has agreed to following a lower carbohydrate, vegetable and lean protein rich diet plan.   Exercise goals: For substantial health benefits, adults should do at least 150 minutes (2 hours and 30 minutes) a week of moderate-intensity, or 75 minutes (1 hour and 15 minutes) a week of vigorous-intensity aerobic physical activity, or an equivalent combination of moderate- and vigorous-intensity aerobic activity. Aerobic activity should be performed in episodes of at least 10 minutes, and preferably, it should be spread throughout the week.  Behavioral modification strategies: increasing lean protein intake, decreasing simple carbohydrates, increasing vegetables and increasing water intake.  Brad [name] has agreed to follow-up with our clinic in 4 weeks. He was informed of the importance of  frequent follow-up visits to maximize his success with intensive lifestyle modifications for his multiple health conditions.   Objective:   There were no vitals taken for this visit. There is no height or weight  on file to calculate BMI.  General: Cooperative, alert, well developed, in no acute distress. HEENT: Conjunctivae and lids unremarkable. Cardiovascular: Regular rhythm.  Lungs: Normal work of breathing. Neurologic: No focal deficits.   Lab Results  Component Value Date   CREATININE 1.23 01/12/2020   BUN 12 01/12/2020   NA 141 01/12/2020   K 4.7 01/12/2020   CL 105 01/12/2020   CO2 25 01/12/2020   Lab Results  Component Value Date   ALT 29 01/12/2020   AST 22 01/12/2020   ALKPHOS 54 01/12/2020   BILITOT 0.7 01/12/2020   Lab Results  Component Value Date   HGBA1C 5.5 01/12/2020   HGBA1C 6.7 (H) 07/06/2019   HGBA1C 6.3 (H) 04/07/2019   HGBA1C 9.5 (H) 11/25/2018   HGBA1C 6.6 (H) 02/18/2018   Lab Results  Component Value Date   TSH 1.260 04/07/2019   Lab Results  Component Value Date   CHOL 125 01/12/2020   HDL 43 01/12/2020   LDLCALC 71 01/12/2020   TRIG 47 01/12/2020   CHOLHDL 2.9 01/12/2020   Lab Results  Component Value Date   WBC 5.8 01/12/2020   HGB 14.1 01/12/2020   HCT 41.3 01/12/2020   MCV 87 01/12/2020   PLT 261 01/12/2020   Attestation Statements:   Reviewed by clinician on day of visit: allergies, medications, problem list, medical history, surgical history, family history, social history, and previous encounter notes.  I, Insurance claims handler, CMA, am acting as transcriptionist for Helane Rima, DO  I have reviewed the above documentation for accuracy and completeness, and I agree with the above. Helane Rima, DO

## 2020-03-16 ENCOUNTER — Ambulatory Visit (INDEPENDENT_AMBULATORY_CARE_PROVIDER_SITE_OTHER): Payer: Medicare Other | Admitting: Family Medicine

## 2020-03-16 ENCOUNTER — Encounter (INDEPENDENT_AMBULATORY_CARE_PROVIDER_SITE_OTHER): Payer: Self-pay | Admitting: Family Medicine

## 2020-03-16 ENCOUNTER — Other Ambulatory Visit: Payer: Self-pay

## 2020-03-16 VITALS — BP 142/84 | HR 70 | Temp 97.9°F | Ht >= 80 in | Wt 328.0 lb

## 2020-03-16 DIAGNOSIS — Z6836 Body mass index (BMI) 36.0-36.9, adult: Secondary | ICD-10-CM | POA: Diagnosis not present

## 2020-03-16 DIAGNOSIS — E1159 Type 2 diabetes mellitus with other circulatory complications: Secondary | ICD-10-CM | POA: Diagnosis not present

## 2020-03-16 DIAGNOSIS — E1169 Type 2 diabetes mellitus with other specified complication: Secondary | ICD-10-CM

## 2020-03-16 DIAGNOSIS — I152 Hypertension secondary to endocrine disorders: Secondary | ICD-10-CM | POA: Diagnosis not present

## 2020-03-16 DIAGNOSIS — E559 Vitamin D deficiency, unspecified: Secondary | ICD-10-CM

## 2020-03-21 NOTE — Progress Notes (Signed)
Chief Complaint:   OBESITY Brad [name] is here to discuss his progress with his obesity treatment plan along with follow-up of his obesity related diagnoses.   Today's visit was #: 14 Starting weight: 334 lbs Starting date: 04/07/2019 Today's weight: 328 lbs Today's date: 03/17/2019 Total lbs lost to date: 6 lbs Body mass index is 36.03 kg/m.  Total weight loss percentage to date: -1.8%  Interim History: Brad [name] says he is still going to the gym daily. He is having increased hunger. We discussed Wellbutrin and tracking. Plan to increase Ozempic mid year. Nutrition Plan: the Category 1 plan 60% of the time.. Anti-obesity medications: Ozempic. Reported side effects: None. Activity: Cardio for 60-80 minutes 5-6 times a week.  Assessment/Plan:   1. Vitamin D deficiency Improving, but not optimized. Current vitamin D is 23.9, tested on 01/12/2020. Optimal goal > 50 ng/dL.   Plan: Continue to take prescription Vitamin D @50 ,000 IU every week as prescribed.  Follow-up for routine testing of Vitamin D, at least 2-3 times per year to avoid over-replacement.  2. Type 2 diabetes mellitus with other specified complication, without long-term current use of insulin (HCC) Medication: Ozempic 1 MG subcutaneously weekly. Issues reviewed: blood sugar goals, complications of diabetes mellitus, hypoglycemia prevention and treatment, exercise, and nutrition.   Plan: The importance of regular follow up with PCP and all other specialists as scheduled was stressed to patient today. Referral placed for primary care to Dr. .  Lab Results  Component Value Date   HGBA1C 5.5 01/12/2020   HGBA1C 6.7 (H) 07/06/2019   HGBA1C 6.3 (H) 04/07/2019   Lab Results  Component Value Date   MICROALBUR 0.3 02/06/2017   LDLCALC 71 01/12/2020   CREATININE 1.23 01/12/2020   - Ambulatory referral to Internal Medicine  3. Hypertension associated with type 2 diabetes mellitus (HCC) Worsening. Medications:  amlodipine, labetalol, losartan, and spironolactone.   Plan: Avoid buying foods that are: processed, frozen, or prepackaged to avoid excess salt. We will continue to monitor closely alongside his PCP and/or Specialist.  Regular follow up with PCP and specialists was also encouraged. Referral placed for primary care to Dr. 01/14/2020.  BP Readings from Last 3 Encounters:  03/16/20 (!) 142/84  01/12/20 (!) 143/83  12/16/19 135/81   Lab Results  Component Value Date   CREATININE 1.23 01/12/2020   - Ambulatory referral to Internal Medicine  4. Class 2 severe obesity with serious comorbidity and body mass index (BMI) of 36.0 to 36.9 in adult, unspecified obesity type Brad [name]) Course: Brad [name] is currently in the action stage of change. As such, his goal is to continue with weight loss efforts.   Nutrition goals: He has agreed to keeping a food journal and adhering to recommended goals of 1500 calories and 100 grams of protein.   Exercise goals: As is.  Behavioral modification strategies: increasing lean protein intake, decreasing simple carbohydrates, increasing vegetables, increasing water intake and decreasing liquid calories.  Brad [name] has agreed to follow-up with our clinic in 3-4 weeks. He was informed of the importance of frequent follow-up visits to maximize his success with intensive lifestyle modifications for his multiple health conditions.   Objective:   Blood pressure (!) 142/84, pulse 70, temperature 97.9 F (36.6 C), temperature source Oral, height 6\' 8"  (2.032 m), weight (!) 328 lb (148.8 kg), SpO2 99 %. Body mass index is 36.03 kg/m.  General: Cooperative, alert, well developed, in no acute distress. HEENT: Conjunctivae and lids unremarkable. Cardiovascular: Regular rhythm.  Lungs: Normal  work of breathing. Neurologic: No focal deficits.   Lab Results  Component Value Date   CREATININE 1.23 01/12/2020   BUN 12 01/12/2020   NA 141 01/12/2020   K 4.7 01/12/2020   CL 105  01/12/2020   CO2 25 01/12/2020   Lab Results  Component Value Date   ALT 29 01/12/2020   AST 22 01/12/2020   ALKPHOS 54 01/12/2020   BILITOT 0.7 01/12/2020   Lab Results  Component Value Date   HGBA1C 5.5 01/12/2020   HGBA1C 6.7 (H) 07/06/2019   HGBA1C 6.3 (H) 04/07/2019   HGBA1C 9.5 (H) 11/25/2018   HGBA1C 6.6 (H) 02/18/2018   Lab Results  Component Value Date   TSH 1.260 04/07/2019   Lab Results  Component Value Date   CHOL 125 01/12/2020   HDL 43 01/12/2020   LDLCALC 71 01/12/2020   TRIG 47 01/12/2020   CHOLHDL 2.9 01/12/2020   Lab Results  Component Value Date   WBC 5.8 01/12/2020   HGB 14.1 01/12/2020   HCT 41.3 01/12/2020   MCV 87 01/12/2020   PLT 261 01/12/2020   Obesity Behavioral Intervention:   Approximately 15 minutes were spent on the discussion below.  ASK: We discussed the diagnosis of obesity with Qamar today and Mase agreed to give [name] permission to discuss obesity behavioral modification therapy today.  ASSESS: Meryl has the diagnosis of obesity and his BMI today is 36.1. Perry is in the action stage of change.   ADVISE: Helton was educated on the multiple health risks of obesity as well as the benefit of weight loss to improve his health. He was advised of the need for long term treatment and the importance of lifestyle modifications to improve his current health and to decrease his risk of future health problems.  AGREE: Multiple dietary modification options and treatment options were discussed and Kolbie agreed to follow the recommendations documented in the above note.  ARRANGE: Lejuan was educated on the importance of frequent visits to treat obesity as outlined per CMS and USPSTF guidelines and agreed to schedule his next follow up appointment today.  Attestation Statements:   Reviewed by clinician on day of visit: allergies, medications, problem list, medical history, surgical history, family history, social history, and previous  encounter notes.  IKathie Rhodes Friedenbach, am acting as Energy manager for [employer], DO.  I have reviewed the above documentation for accuracy and completeness, and I agree with the above. Helane Rima, DO

## 2020-04-13 ENCOUNTER — Ambulatory Visit (INDEPENDENT_AMBULATORY_CARE_PROVIDER_SITE_OTHER): Payer: Medicare Other | Admitting: Family Medicine

## 2020-05-23 ENCOUNTER — Other Ambulatory Visit: Payer: Self-pay

## 2020-05-23 ENCOUNTER — Encounter (INDEPENDENT_AMBULATORY_CARE_PROVIDER_SITE_OTHER): Payer: Self-pay | Admitting: Family Medicine

## 2020-05-23 ENCOUNTER — Ambulatory Visit (INDEPENDENT_AMBULATORY_CARE_PROVIDER_SITE_OTHER): Payer: Medicare Other | Admitting: Family Medicine

## 2020-05-23 VITALS — BP 133/76 | HR 68 | Temp 98.0°F | Ht >= 80 in | Wt 326.0 lb

## 2020-05-23 DIAGNOSIS — N528 Other male erectile dysfunction: Secondary | ICD-10-CM

## 2020-05-23 DIAGNOSIS — E1159 Type 2 diabetes mellitus with other circulatory complications: Secondary | ICD-10-CM | POA: Diagnosis not present

## 2020-05-23 DIAGNOSIS — E66812 Obesity, class 2: Secondary | ICD-10-CM

## 2020-05-23 DIAGNOSIS — Z6837 Body mass index (BMI) 37.0-37.9, adult: Secondary | ICD-10-CM

## 2020-05-23 DIAGNOSIS — I152 Hypertension secondary to endocrine disorders: Secondary | ICD-10-CM

## 2020-05-23 DIAGNOSIS — E1169 Type 2 diabetes mellitus with other specified complication: Secondary | ICD-10-CM | POA: Diagnosis not present

## 2020-05-24 MED ORDER — AMLODIPINE BESYLATE 10 MG PO TABS: 10.0000 mg | ORAL_TABLET | Freq: Every day | ORAL | 3 refills | Status: DC

## 2020-05-24 MED ORDER — TADALAFIL 20 MG PO TABS: 20.0000 mg | ORAL_TABLET | Freq: Every day | ORAL | 3 refills | Status: DC | PRN

## 2020-05-24 MED ORDER — LOSARTAN POTASSIUM 100 MG PO TABS: 100.0000 mg | ORAL_TABLET | Freq: Every day | ORAL | 3 refills | Status: DC

## 2020-05-24 MED ORDER — LABETALOL HCL 200 MG PO TABS: ORAL_TABLET | ORAL | 3 refills | Status: DC

## 2020-05-24 NOTE — Progress Notes (Signed)
Chief Complaint:   OBESITY Brad [name] is here to discuss his progress with his obesity treatment plan along with follow-up of his obesity related diagnoses.   Today's visit was #: 15 Starting weight: 334 lbs Starting date: 04/07/2019 Today's weight: 326 lbs Today's date: 05/23/2020 Total lbs lost to date: 8 lbs Body mass index is 35.81 kg/m.  Total weight loss percentage to date: -2.40%  Interim History:  Brad [name] says he is doing well. Current Meal Plan: keeping a food journal and adhering to recommended goals of 1500 calories and 100 grams of protein for 60% of the time.  Current Exercise Plan: Cardio for 90-120 minutes 6 times per week. Current Anti-Obesity Medications: Ozempic 1 mg subcutaneously weekly. Side effects: None.  Assessment/Plan:   1. Hypertension associated with type 2 diabetes mellitus (HCC) At goal. Medications: Norvasc 10 mg daily, labetalol 200 mg twice daily, losartan 100 mg daily.  Plan: Avoid buying foods that are: processed, frozen, or prepackaged to avoid excess salt. We will continue to monitor closely alongside his PCP and/or Specialist.    BP Readings from Last 3 Encounters:  05/23/20 133/76  03/16/20 (!) 142/84  01/12/20 (!) 143/83   Lab Results  Component Value Date   CREATININE 1.23 01/12/2020   - Refill amLODipine (NORVASC) 10 MG tablet; Take 1 tablet (10 mg total) by mouth daily.  Dispense: 90 tablet; Refill: 3 - Refill labetalol (NORMODYNE) 200 MG tablet; TAKE 1 TABLET(200 MG) BY MOUTH TWICE DAILY  Dispense: 180 tablet; Refill: 3 - Refill losartan (COZAAR) 100 MG tablet; Take 1 tablet (100 mg total) by mouth daily.  Dispense: 90 tablet; Refill: 3  2. Type 2 diabetes mellitus with other specified complication, without long-term current use of insulin (HCC) Diabetes Mellitus: At goal. Medication: Ozempic 1 mg subcutaneously weekly. Issues reviewed: blood sugar goals, complications of diabetes mellitus, hypoglycemia prevention and treatment,  exercise, and nutrition.   Plan: The importance of regular follow up with PCP and all other specialists as scheduled was stressed to patient today.  Lab Results  Component Value Date   HGBA1C 5.5 01/12/2020   HGBA1C 6.7 (H) 07/06/2019   HGBA1C 6.3 (H) 04/07/2019   Lab Results  Component Value Date   MICROALBUR 0.3 02/06/2017   LDLCALC 71 01/12/2020   CREATININE 1.23 01/12/2020   3. Other male erectile dysfunction Will refill Cialis today, as per below.  - Refill tadalafil (CIALIS) 20 MG tablet; Take 1 tablet (20 mg total) by mouth daily as needed.  Dispense: 30 tablet; Refill: 3  4. Obesity, current BMI 35.9  Course: Brad [name] is currently in the action stage of change. As such, his goal is to continue with weight loss efforts.   Nutrition goals: He has agreed to keeping a food journal and adhering to recommended goals of 1500 calories and 100 grams of protein.   Exercise goals: For substantial health benefits, adults should do at least 150 minutes (2 hours and 30 minutes) a week of moderate-intensity, or 75 minutes (1 hour and 15 minutes) a week of vigorous-intensity aerobic physical activity, or an equivalent combination of moderate- and vigorous-intensity aerobic activity. Aerobic activity should be performed in episodes of at least 10 minutes, and preferably, it should be spread throughout the week.  Behavioral modification strategies: increasing lean protein intake, decreasing simple carbohydrates and increasing vegetables.  Brad [name] has agreed to follow-up with our clinic in 4 weeks. He was informed of the importance of frequent follow-up visits to maximize his success with intensive  lifestyle modifications for his multiple health conditions.   Objective:   Blood pressure 133/76, pulse 68, temperature 98 F (36.7 C), temperature source Oral, height 6\' 8"  (2.032 m), weight (!) 326 lb (147.9 kg), SpO2 97 %. Body mass index is 35.81 kg/m.  General: Cooperative, alert, well  developed, in no acute distress. HEENT: Conjunctivae and lids unremarkable. Cardiovascular: Regular rhythm.  Lungs: Normal work of breathing. Neurologic: No focal deficits.   Lab Results  Component Value Date   CREATININE 1.23 01/12/2020   BUN 12 01/12/2020   NA 141 01/12/2020   K 4.7 01/12/2020   CL 105 01/12/2020   CO2 25 01/12/2020   Lab Results  Component Value Date   ALT 29 01/12/2020   AST 22 01/12/2020   ALKPHOS 54 01/12/2020   BILITOT 0.7 01/12/2020   Lab Results  Component Value Date   HGBA1C 5.5 01/12/2020   HGBA1C 6.7 (H) 07/06/2019   HGBA1C 6.3 (H) 04/07/2019   HGBA1C 9.5 (H) 11/25/2018   HGBA1C 6.6 (H) 02/18/2018   Lab Results  Component Value Date   TSH 1.260 04/07/2019   Lab Results  Component Value Date   CHOL 125 01/12/2020   HDL 43 01/12/2020   LDLCALC 71 01/12/2020   TRIG 47 01/12/2020   CHOLHDL 2.9 01/12/2020   Lab Results  Component Value Date   WBC 5.8 01/12/2020   HGB 14.1 01/12/2020   HCT 41.3 01/12/2020   MCV 87 01/12/2020   PLT 261 01/12/2020   Obesity Behavioral Intervention:   Approximately 15 minutes were spent on the discussion below.  ASK: We discussed the diagnosis of obesity with Brad [name] today and Brad [name] agreed to give 01/14/2020 permission to discuss obesity behavioral modification therapy today.  ASSESS: Brad [name] has the diagnosis of obesity and his BMI today is 35.9. Brad [name] is in the action stage of change.   ADVISE: Brad [name] was educated on the multiple health risks of obesity as well as the benefit of weight loss to improve his health. He was advised of the need for long term treatment and the importance of lifestyle modifications to improve his current health and to decrease his risk of future health problems.  AGREE: Multiple dietary modification options and treatment options were discussed and Brad [name] agreed to follow the recommendations documented in the above note.  ARRANGE: Brad [name] was educated on the importance of  frequent visits to treat obesity as outlined per CMS and USPSTF guidelines and agreed to schedule his next follow up appointment today.  Attestation Statements:   Reviewed by clinician on day of visit: allergies, medications, problem list, medical history, surgical history, family history, social history, and previous encounter notes.  I, Brad [name], CMA, am acting as transcriptionist for Insurance claims handler, DO  I have reviewed the above documentation for accuracy and completeness, and I agree with the above. Brad Rima, DO

## 2020-06-10 ENCOUNTER — Encounter: Payer: Self-pay | Admitting: Family Medicine

## 2020-06-10 ENCOUNTER — Ambulatory Visit: Payer: Medicare Other | Admitting: Family Medicine

## 2020-06-10 ENCOUNTER — Other Ambulatory Visit: Payer: Self-pay

## 2020-06-10 VITALS — BP 130/68 | HR 69 | Ht >= 80 in | Wt 332.0 lb

## 2020-06-10 DIAGNOSIS — N528 Other male erectile dysfunction: Secondary | ICD-10-CM

## 2020-06-10 DIAGNOSIS — I10 Essential (primary) hypertension: Secondary | ICD-10-CM

## 2020-06-10 DIAGNOSIS — Z87891 Personal history of nicotine dependence: Secondary | ICD-10-CM

## 2020-06-10 DIAGNOSIS — E559 Vitamin D deficiency, unspecified: Secondary | ICD-10-CM

## 2020-06-10 DIAGNOSIS — Z114 Encounter for screening for human immunodeficiency virus [HIV]: Secondary | ICD-10-CM | POA: Diagnosis not present

## 2020-06-10 DIAGNOSIS — E119 Type 2 diabetes mellitus without complications: Secondary | ICD-10-CM

## 2020-06-10 MED ORDER — TADALAFIL 20 MG PO TABS: 20.0000 mg | ORAL_TABLET | Freq: Every day | ORAL | 3 refills | Status: DC | PRN

## 2020-06-10 NOTE — Assessment & Plan Note (Signed)
Continue 1 mg Ozempic on Tuesdays.  Last A1c<6 in December 2021.  Repeat today.  Previous labs are reviewed.  He has a stable serum creatinine.  LDL at goal.  Not on a statin.  Encouraged to continue to work out.  Praised patient on his lifestyle changes having a stroke.  Follow-up in 3 months.

## 2020-06-10 NOTE — Assessment & Plan Note (Signed)
Stable.  Refill Cialis upon request.

## 2020-06-10 NOTE — Assessment & Plan Note (Signed)
Discuss low-dose CT at next visit.

## 2020-06-10 NOTE — Assessment & Plan Note (Signed)
BP at goal.  Continue amlodipine 10 mg, labetalol 200 mg twice daily, losartan 100 mg daily, Aldactone 50 mg daily.

## 2020-06-10 NOTE — Assessment & Plan Note (Signed)
December 2021 vitamin D low.  Status post repletion.  Repeat vitamin D level today.  Replete if needed.

## 2020-06-10 NOTE — Progress Notes (Signed)
   SUBJECTIVE:   CHIEF COMPLAINT / HPI:   Chief Complaint  Patient presents with  . New Patient (Initial Visit)     Brad [name] is a 60 y.o. male here to establish care at [facility]. His previous physician was Brad [name] in [facility]. He has no concerns today. Has well controlled diabetes with Ozempic. Previously had malignant HTN, CVA in 2016. HTN is well controlled now with several medications.  History of vitamin D deficiency.  Has completed course of high-dose vitamin D.  He goes to the gym five days a week. Former smoker 2016 for 1 ppd for 16-17 years, drink alcohol or use illicit drugs. He is married and has 2 older children from a previous marriage.   PERTINENT  PMH / PSH: reviewed and updated as appropriate   OBJECTIVE:   BP 130/68   Pulse 69   Ht 6' 8.5" (2.045 m)   Wt (!) 332 lb (150.6 kg)   SpO2 96%   BMI 36.02 kg/m    GEN: pleasant well-appearing male, in no acute distress  CV: regular rate and rhythm, no murmurs appreciated  RESP: no increased work of breathing, clear to ascultation bilaterally  ABD: Bowel sounds present, soft, nontender, nondistended MSK: no lower extremity edema edema, strength 5 out of 5 bilateral upper and lower EXTR SKIN: warm, dry NEURO: No focal weakness, normal speech, alert and oriented, moves all extremities appropriately PSYCH: Normal affect, appropriate speech and behavior    ASSESSMENT/PLAN:   Essential hypertension BP at goal.  Continue amlodipine 10 mg, labetalol 200 mg twice daily, losartan 100 mg daily, Aldactone 50 mg daily.  Diabetes mellitus without complication (HCC) Continue 1 mg Ozempic on Tuesdays.  Last A1c<6 in December 2021.  Repeat today.  Previous labs are reviewed.  He has a stable serum creatinine.  LDL at goal.  Not on a statin.  Encouraged to continue to work out.  Praised patient on his lifestyle changes having a stroke.  Follow-up in 3 months.  Former smoker Discuss low-dose CT at next visit.  Erectile  dysfunction Stable.  Refill Cialis upon request.  Vitamin D deficiency December 2021 vitamin D low.  Status post repletion.  Repeat vitamin D level today.  Replete if needed.   Reports having a normal colonoscopy <10 years ago.   Brad Cabal, DO PGY-2, [facility] 06/10/2020

## 2020-06-10 NOTE — Patient Instructions (Signed)
It was great seeing you today!  Please check-out at the front desk before leaving the clinic. I'd like to see you back in  3 months but if you need to be seen earlier than that for any new issues we're happy to fit you in, just give [name] a call!  Visit Remembers: - Stop by the pharmacy to pick up your prescriptions  - Continue to work on your healthy eating habits and incorporating exercise into your daily life.  - Your goal is to have an BP < 120/80 - Medicine Changes: None - To Do: Get your eyes checked and have Dr. Suzan Slick send me the records.     Regarding lab work today:  Due to recent changes in healthcare laws, you may see the results of your imaging and laboratory studies on MyChart before your provider has had a chance to review them.  I understand that in some cases there may be results that are confusing or concerning to you. Not all laboratory results come back in the same time frame and you may be waiting for multiple results in order to interpret others.  Please give [name] 72 hours in order for your provider to thoroughly review all the results before contacting the office for clarification of your results. If everything is normal, you will get a letter in the mail or a message in My Chart. Please give [name] a call if you do not hear from [name] after 2 weeks.  Please bring all of your medications with you to each visit.    If you haven't already, sign up for My Chart to have easy access to your labs results, and communication with your primary care physician.  Feel free to call with any questions or concerns at any time, at [phone].   Take care,  Dr. Katherina [facility]

## 2020-06-11 LAB — VITAMIN D 25 HYDROXY (VIT D DEFICIENCY, FRACTURES): Vit D, 25-Hydroxy: 21 ng/mL — ABNORMAL LOW (ref 30.0–100.0)

## 2020-06-11 LAB — HEMOGLOBIN A1C
Est. average glucose Bld gHb Est-mCnc: 123 mg/dL
Hgb A1c MFr Bld: 5.9 % — ABNORMAL HIGH (ref 4.8–5.6)

## 2020-06-12 ENCOUNTER — Other Ambulatory Visit: Payer: Self-pay | Admitting: Family Medicine

## 2020-06-12 DIAGNOSIS — E559 Vitamin D deficiency, unspecified: Secondary | ICD-10-CM

## 2020-06-12 MED ORDER — VITAMIN D (ERGOCALCIFEROL) 1.25 MG (50000 UNIT) PO CAPS: 50000.0000 [IU] | ORAL_CAPSULE | ORAL | 0 refills | Status: DC

## 2020-06-22 ENCOUNTER — Ambulatory Visit (INDEPENDENT_AMBULATORY_CARE_PROVIDER_SITE_OTHER): Payer: Medicare Other | Admitting: Family Medicine

## 2020-07-06 ENCOUNTER — Ambulatory Visit (INDEPENDENT_AMBULATORY_CARE_PROVIDER_SITE_OTHER): Payer: Medicare Other | Admitting: Family Medicine

## 2020-07-06 ENCOUNTER — Other Ambulatory Visit: Payer: Self-pay

## 2020-07-06 ENCOUNTER — Encounter (INDEPENDENT_AMBULATORY_CARE_PROVIDER_SITE_OTHER): Payer: Self-pay | Admitting: Family Medicine

## 2020-07-06 VITALS — BP 139/82 | HR 66 | Temp 98.1°F | Ht >= 80 in | Wt 331.0 lb

## 2020-07-06 DIAGNOSIS — E1159 Type 2 diabetes mellitus with other circulatory complications: Secondary | ICD-10-CM

## 2020-07-06 DIAGNOSIS — E1169 Type 2 diabetes mellitus with other specified complication: Secondary | ICD-10-CM | POA: Diagnosis not present

## 2020-07-06 DIAGNOSIS — E559 Vitamin D deficiency, unspecified: Secondary | ICD-10-CM

## 2020-07-06 DIAGNOSIS — Z6837 Body mass index (BMI) 37.0-37.9, adult: Secondary | ICD-10-CM

## 2020-07-06 DIAGNOSIS — I152 Hypertension secondary to endocrine disorders: Secondary | ICD-10-CM

## 2020-07-06 MED ORDER — SEMAGLUTIDE (2 MG/DOSE) 8 MG/3ML ~~LOC~~ SOPN: 2.0000 mg | PEN_INJECTOR | Freq: Every day | SUBCUTANEOUS | 3 refills | Status: DC

## 2020-07-06 NOTE — Progress Notes (Signed)
Chief Complaint:   OBESITY Brad Brad [name] is here to discuss his progress with his obesity treatment plan along with follow-up of his obesity related diagnoses. See Medical Weight Management Flowsheet for bioelectrical impedance results.  Today's visit was #: 16 Starting weight: 334 lbs Starting date: 04/07/2019 Today's weight: 331 lbs Today's date: 07/06/2020 Weight change since last visit: +5 lbs Total lbs lost to date: 3 lbs Body mass index is 36.36 kg/m.  Total weight loss percentage to date: -0.90%  Interim History: Brad Brad [name] says he enjoyed meeting his new PCP.  His vitamin Brad [name] and FLP were reviewed.  Today's bioimpedance results indicate that Brad Brad [name] has gained 5 pounds of water weight since his last visit.  Nutrition Plan: keeping a food journal and adhering to recommended goals of 1500 calories and 90 grams of protein. Activity:  Cardio/strength training for 90 minutes 5-6 times per week. Anti-obesity medications: Ozempic 1 mg subcutaneously weekly. Reported side effects: None.  Assessment/Plan:   1. Vitamin Brad [name] deficiency Not at goal. Current vitamin Brad [name] is 21.0, tested on 06/10/2020. Optimal goal > 50 ng/dL.  Brad Brad [name] 50,000 IU weekly.  Plan: Continue to take prescription Vitamin Brad [name] @50 ,000 IU every week as prescribed.  Follow-up for routine testing of Vitamin Brad [name], at least 2-3 times per year to avoid over-replacement.  2. Type 2 diabetes mellitus with other specified complication, without long-term current use of insulin (HCC) Diabetes Mellitus: Not at goal. Medication: Ozempic 1 mg subcutaneously weekly. Issues reviewed: blood sugar goals, complications of diabetes mellitus, hypoglycemia prevention and treatment, exercise, and nutrition.   Plan:  Increase Ozempic to 2 mg subcutaneously weekly, as per below.  The patient will continue to focus on protein-rich, low simple carbohydrate foods. We reviewed the importance of hydration, regular exercise for stress reduction, and  restorative sleep.   Lab Results  Component Value Date   HGBA1C 5.9 (H) 06/10/2020   HGBA1C 5.5 01/12/2020   HGBA1C 6.7 (H) 07/06/2019   Lab Results  Component Value Date   MICROALBUR 0.3 02/06/2017   LDLCALC 71 01/12/2020   CREATININE 1.23 01/12/2020   - Increase and refill Semaglutide, 2 MG/DOSE, 8 MG/3ML SOPN; Inject 2 mg into the skin once a week.  Dispense: 3 mL; Refill: 3  3. Hypertension associated with type 2 diabetes mellitus (HCC) At goal. Medications: Norvasc 10 mg daily, labetalol 200 mg daily, Cozaar 100 mg daily, Aldactone 50 mg daily.   Plan: Avoid buying foods that are: processed, frozen, or prepackaged to avoid excess salt. We will watch for signs of hypotension as he continues lifestyle modifications.  BP Readings from Last 3 Encounters:  07/06/20 139/82  06/10/20 130/68  05/23/20 133/76   Lab Results  Component Value Date   CREATININE 1.23 01/12/2020   4. Obesity, current BMI 36.4  Course: Brad Brad [name] is currently in the action stage of change. As such, his goal is to continue with weight loss efforts.   Nutrition goals: He has agreed to keeping a food journal and adhering to recommended goals of 1500 calories and 100 grams of protein.   Exercise goals: For substantial health benefits, adults should do at least 150 minutes (2 hours and 30 minutes) a week of moderate-intensity, or 75 minutes (1 hour and 15 minutes) a week of vigorous-intensity aerobic physical activity, or an equivalent combination of moderate- and vigorous-intensity aerobic activity. Aerobic activity should be performed in episodes of at least 10 minutes, and preferably, it should be spread throughout the week.  Behavioral modification strategies: increasing lean protein intake, decreasing simple carbohydrates, increasing vegetables, and increasing water intake.  Brad [name] has agreed to follow-up with our clinic in 4 weeks. He was informed of the importance of frequent follow-up visits to maximize  his success with intensive lifestyle modifications for his multiple health conditions.   Objective:   Blood pressure 139/82, pulse 66, temperature 98.1 F (36.7 C), temperature source Oral, height 6\' 8"  (2.032 m), weight (!) 331 lb (150.1 kg), SpO2 99 %. Body mass index is 36.36 kg/m.  General: Cooperative, alert, well developed, in no acute distress. HEENT: Conjunctivae and lids unremarkable. Cardiovascular: Regular rhythm.  Lungs: Normal work of breathing. Neurologic: No focal deficits.   Lab Results  Component Value Date   CREATININE 1.23 01/12/2020   BUN 12 01/12/2020   NA 141 01/12/2020   K 4.7 01/12/2020   CL 105 01/12/2020   CO2 25 01/12/2020   Lab Results  Component Value Date   ALT 29 01/12/2020   AST 22 01/12/2020   ALKPHOS 54 01/12/2020   BILITOT 0.7 01/12/2020   Lab Results  Component Value Date   HGBA1C 5.9 (H) 06/10/2020   HGBA1C 5.5 01/12/2020   HGBA1C 6.7 (H) 07/06/2019   HGBA1C 6.3 (H) 04/07/2019   HGBA1C 9.5 (H) 11/25/2018   Lab Results  Component Value Date   TSH 1.260 04/07/2019   Lab Results  Component Value Date   CHOL 125 01/12/2020   HDL 43 01/12/2020   LDLCALC 71 01/12/2020   TRIG 47 01/12/2020   CHOLHDL 2.9 01/12/2020   Lab Results  Component Value Date   WBC 5.8 01/12/2020   HGB 14.1 01/12/2020   HCT 41.3 01/12/2020   MCV 87 01/12/2020   PLT 261 01/12/2020   Obesity Behavioral Intervention:   Approximately 15 minutes were spent on the discussion below.  ASK: We discussed the diagnosis of obesity with Brad [name] today and Brad [name] agreed to give 01/14/2020 permission to discuss obesity behavioral modification therapy today.  ASSESS: Brad [name] has the diagnosis of obesity and his BMI today is 36.4. Brad [name] is in the action stage of change.   ADVISE: Brad [name] was educated on the multiple health risks of obesity as well as the benefit of weight loss to improve his health. He was advised of the need for long term treatment and the importance of  lifestyle modifications to improve his current health and to decrease his risk of future health problems.  AGREE: Multiple dietary modification options and treatment options were discussed and Brad Brad [name] agreed to follow the recommendations documented in the above note.  ARRANGE: Chester was educated on the importance of frequent visits to treat obesity as outlined per CMS and USPSTF guidelines and agreed to schedule his next follow up appointment today.  Attestation Statements:   Reviewed by clinician on day of visit: allergies, medications, problem list, medical history, surgical history, family history, social history, and previous encounter notes.  I, Briscoe Burns, CMA, am acting as transcriptionist for Insurance claims handler, DO  I have reviewed the above documentation for accuracy and completeness, and I agree with the above. Helane Rima, DO

## 2020-07-08 MED ORDER — SEMAGLUTIDE (2 MG/DOSE) 8 MG/3ML ~~LOC~~ SOPN: 2.0000 mg | PEN_INJECTOR | SUBCUTANEOUS | 3 refills | Status: DC

## 2020-08-09 ENCOUNTER — Ambulatory Visit (INDEPENDENT_AMBULATORY_CARE_PROVIDER_SITE_OTHER): Payer: Medicare Other | Admitting: Family Medicine

## 2020-08-10 ENCOUNTER — Ambulatory Visit (INDEPENDENT_AMBULATORY_CARE_PROVIDER_SITE_OTHER): Payer: Medicare Other | Admitting: Family Medicine

## 2020-08-10 ENCOUNTER — Encounter (INDEPENDENT_AMBULATORY_CARE_PROVIDER_SITE_OTHER): Payer: Self-pay | Admitting: Family Medicine

## 2020-08-10 ENCOUNTER — Other Ambulatory Visit: Payer: Self-pay

## 2020-08-10 VITALS — BP 122/79 | HR 64 | Temp 97.8°F | Ht >= 80 in | Wt 328.0 lb

## 2020-08-10 DIAGNOSIS — Z6837 Body mass index (BMI) 37.0-37.9, adult: Secondary | ICD-10-CM | POA: Diagnosis not present

## 2020-08-10 DIAGNOSIS — E1169 Type 2 diabetes mellitus with other specified complication: Secondary | ICD-10-CM

## 2020-08-10 DIAGNOSIS — Z9989 Dependence on other enabling machines and devices: Secondary | ICD-10-CM

## 2020-08-10 DIAGNOSIS — G4733 Obstructive sleep apnea (adult) (pediatric): Secondary | ICD-10-CM

## 2020-08-10 DIAGNOSIS — E1159 Type 2 diabetes mellitus with other circulatory complications: Secondary | ICD-10-CM | POA: Diagnosis not present

## 2020-08-10 DIAGNOSIS — I152 Hypertension secondary to endocrine disorders: Secondary | ICD-10-CM

## 2020-08-10 DIAGNOSIS — R609 Edema, unspecified: Secondary | ICD-10-CM

## 2020-08-10 MED ORDER — CHLORTHALIDONE 25 MG PO TABS: 25.0000 mg | ORAL_TABLET | Freq: Every day | ORAL | 2 refills | Status: DC

## 2020-08-23 NOTE — Progress Notes (Signed)
Chief Complaint:   OBESITY Brad [name] is here to discuss his progress with his obesity treatment plan along with follow-up of his obesity related diagnoses.   Today's visit was #: 17 Starting weight: 334 lbs Starting date: 04/07/2019 Today's weight: 328 lbs Today's date: 08/10/2020 Weight change since last visit: 3 lbs Total lbs lost to date: 6 lbs Body mass index is 36.03 kg/m.  Total weight loss percentage to date: -1.80%  Interim History:  Halim has had increased edema.  Otherwise, he is happy with his progress. Current Meal Plan: keeping a food journal and adhering to recommended goals of 1500 calories and 100 grams of protein for 64% of the time.  Current Exercise Plan: Cardio/strength training for 90-120 minutes 5 times per week.  Assessment/Plan:   Meds ordered this encounter  Medications   chlorthalidone (HYGROTON) 25 MG tablet    Sig: Take 1 tablet (25 mg total) by mouth daily.    Dispense:  30 tablet    Refill:  2    1. Edema, unspecified type Stop spironolactone and start chlorthalidone 25 mg daily, as per below.  - Start chlorthalidone (HYGROTON) 25 MG tablet; Take 1 tablet (25 mg total) by mouth daily.  Dispense: 30 tablet; Refill: 2  2. Type 2 diabetes mellitus with other specified complication, without long-term current use of insulin (HCC) Diabetes Mellitus: Not at goal. Medication: None. Issues reviewed: blood sugar goals, complications of diabetes mellitus, hypoglycemia prevention and treatment, exercise, and nutrition.   Plan: The patient will continue to focus on protein-rich, low simple carbohydrate foods. We reviewed the importance of hydration, regular exercise for stress reduction, and restorative sleep.   Lab Results  Component Value Date   HGBA1C 5.9 (H) 06/10/2020   HGBA1C 5.5 01/12/2020   HGBA1C 6.7 (H) 07/06/2019   Lab Results  Component Value Date   MICROALBUR 0.3 02/06/2017   LDLCALC 71 01/12/2020   CREATININE 1.23 01/12/2020   3.  Hypertension associated with type 2 diabetes mellitus (HCC) At goal. Medications: Norvasc 10 mg daily, labetalol 200 mg twice daily, losartan 100 mg daily.   Plan: Avoid buying foods that are: processed, frozen, or prepackaged to avoid excess salt. We will watch for signs of hypotension as he continues lifestyle modifications.  BP Readings from Last 3 Encounters:  08/10/20 122/79  07/06/20 139/82  06/10/20 130/68   Lab Results  Component Value Date   CREATININE 1.23 01/12/2020   4. OSA on CPAP OSA is a cause of systemic hypertension and is associated with an increased incidence of stroke, heart failure, atrial fibrillation, and coronary heart disease. Severe OSA increases all-cause mortality and cardiovascular mortality.   Goal: Treatment of OSA via CPAP compliance and weight loss. Plasma ghrelin levels (appetite or "hunger hormone") are significantly higher in OSA patients than in BMI-matched controls, but decrease to levels similar to those of obese patients without OSA after CPAP treatment.  Weight loss improves OSA by several mechanisms, including reduction in fatty tissue in the throat (i.e. parapharyngeal fat) and the tongue. Loss of abdominal fat increases mediastinal traction on the upper airway making it less likely to collapse during sleep. Studies have also shown that compliance with CPAP treatment improves leptin (hunger inhibitory hormone) imbalance.  5. Obesity, current BMI 36.1  Course: Bradely is currently in the action stage of change. As such, his goal is to continue with weight loss efforts.   Nutrition goals: He has agreed to keeping a food journal and adhering to recommended  goals of 1500 calories and 100 grams of protein.   Exercise goals:  As is.  Behavioral modification strategies: increasing lean protein intake, decreasing simple carbohydrates, increasing vegetables, and increasing water intake.  Davied has agreed to follow-up with our clinic in 4 weeks. He was  informed of the importance of frequent follow-up visits to maximize his success with intensive lifestyle modifications for his multiple health conditions.   Objective:   Blood pressure 122/79, pulse 64, temperature 97.8 F (36.6 C), temperature source Oral, height 6\' 8"  (2.032 m), weight (!) 328 lb (148.8 kg), SpO2 97 %. Body mass index is 36.03 kg/m.  General: Cooperative, alert, well developed, in no acute distress. HEENT: Conjunctivae and lids unremarkable. Cardiovascular: Regular rhythm.  Lungs: Normal work of breathing. Neurologic: No focal deficits.   Lab Results  Component Value Date   CREATININE 1.23 01/12/2020   BUN 12 01/12/2020   NA 141 01/12/2020   K 4.7 01/12/2020   CL 105 01/12/2020   CO2 25 01/12/2020   Lab Results  Component Value Date   ALT 29 01/12/2020   AST 22 01/12/2020   ALKPHOS 54 01/12/2020   BILITOT 0.7 01/12/2020   Lab Results  Component Value Date   HGBA1C 5.9 (H) 06/10/2020   HGBA1C 5.5 01/12/2020   HGBA1C 6.7 (H) 07/06/2019   HGBA1C 6.3 (H) 04/07/2019   HGBA1C 9.5 (H) 11/25/2018   Lab Results  Component Value Date   TSH 1.260 04/07/2019   Lab Results  Component Value Date   CHOL 125 01/12/2020   HDL 43 01/12/2020   LDLCALC 71 01/12/2020   TRIG 47 01/12/2020   CHOLHDL 2.9 01/12/2020   Lab Results  Component Value Date   VD25OH 21.0 (L) 06/10/2020   VD25OH 23.9 (L) 01/12/2020   VD25OH 30.4 04/07/2019   Lab Results  Component Value Date   WBC 5.8 01/12/2020   HGB 14.1 01/12/2020   HCT 41.3 01/12/2020   MCV 87 01/12/2020   PLT 261 01/12/2020   Obesity Behavioral Intervention:   Approximately 15 minutes were spent on the discussion below.  ASK: We discussed the diagnosis of obesity with Jad today and Omarri agreed to give 01/14/2020 permission to discuss obesity behavioral modification therapy today.  ASSESS: Mardy has the diagnosis of obesity and his BMI today is 36.1. Jahden is in the action stage of change.    ADVISE: Takao was educated on the multiple health risks of obesity as well as the benefit of weight loss to improve his health. He was advised of the need for long term treatment and the importance of lifestyle modifications to improve his current health and to decrease his risk of future health problems.  AGREE: Multiple dietary modification options and treatment options were discussed and Huntington agreed to follow the recommendations documented in the above note.  ARRANGE: Ryin was educated on the importance of frequent visits to treat obesity as outlined per CMS and USPSTF guidelines and agreed to schedule his next follow up appointment today.  Attestation Statements:   Reviewed by clinician on day of visit: allergies, medications, problem list, medical history, surgical history, family history, social history, and previous encounter notes.  I, Briscoe Burns, CMA, am acting as transcriptionist for Insurance claims handler, DO  I have reviewed the above documentation for accuracy and completeness, and I agree with the above. Helane Rima, DO

## 2020-09-12 ENCOUNTER — Encounter (INDEPENDENT_AMBULATORY_CARE_PROVIDER_SITE_OTHER): Payer: Self-pay | Admitting: Family Medicine

## 2020-09-12 ENCOUNTER — Other Ambulatory Visit: Payer: Self-pay

## 2020-09-12 ENCOUNTER — Ambulatory Visit (INDEPENDENT_AMBULATORY_CARE_PROVIDER_SITE_OTHER): Payer: Medicare Other | Admitting: Family Medicine

## 2020-09-12 VITALS — BP 121/73 | HR 77 | Temp 97.6°F | Ht >= 80 in | Wt 327.0 lb

## 2020-09-12 DIAGNOSIS — E1159 Type 2 diabetes mellitus with other circulatory complications: Secondary | ICD-10-CM

## 2020-09-12 DIAGNOSIS — E1169 Type 2 diabetes mellitus with other specified complication: Secondary | ICD-10-CM | POA: Diagnosis not present

## 2020-09-12 DIAGNOSIS — N528 Other male erectile dysfunction: Secondary | ICD-10-CM

## 2020-09-12 DIAGNOSIS — I152 Hypertension secondary to endocrine disorders: Secondary | ICD-10-CM

## 2020-09-12 DIAGNOSIS — Z6837 Body mass index (BMI) 37.0-37.9, adult: Secondary | ICD-10-CM | POA: Diagnosis not present

## 2020-09-13 MED ORDER — TADALAFIL 20 MG PO TABS: 20.0000 mg | ORAL_TABLET | Freq: Every day | ORAL | 3 refills | Status: DC | PRN

## 2020-09-14 NOTE — Progress Notes (Signed)
Chief Complaint:   OBESITY Brad [name] is here to discuss his progress with his obesity treatment plan along with follow-up of his obesity related diagnoses.   Today's visit was #: 18 Starting weight: 334 lbs Starting date: 04/07/2019 Today's weight: 327 lbs Today's date: 09/12/2020 Weight change since last visit: -1 lb Total lbs lost to date: 7 lbs Body mass index is 35.92 kg/m.  Total weight loss percentage to date: -2.10%  Current Meal Plan: keeping a food journal and adhering to recommended goals of 1500 calories and 100 grams of protein for 50% of the time.  Current Exercise Plan: Cardio for 90 minutes 6 times per week.  Interim History: Trason is doing well and loves exercise. He has is check up with his PCP soon.  Assessment/Plan:   1. Type 2 diabetes mellitus with other specified complication, without long-term current use of insulin (HCC) Diabetes Mellitus: At goal. Medication: None.   Plan: We will recheck labs in 3 months if not done with Endocrinology or PCP.  The patient will continue to focus on protein-rich, low simple carbohydrate foods. We reviewed the importance of hydration, regular exercise for stress reduction, and restorative sleep.   Lab Results  Component Value Date   HGBA1C 5.9 (H) 06/10/2020   HGBA1C 5.5 01/12/2020   HGBA1C 6.7 (H) 07/06/2019   Lab Results  Component Value Date   MICROALBUR 0.3 02/06/2017   LDLCALC 71 01/12/2020   CREATININE 1.23 01/12/2020    2. Hypertension associated with type 2 diabetes mellitus (HCC) Controlled. Medications: Norvasc 10 mg daily, labetalol 200 mg twice daily, losartan 100 mg daily.   Plan: Avoid buying foods that are: processed, frozen, or prepackaged to avoid excess salt. We will watch for signs of hypotension as he continues lifestyle modifications. We will continue to monitor closely alongside his PCP and/or Specialist.    BP Readings from Last 3 Encounters:  09/12/20 121/73  08/10/20 122/79   07/06/20 139/82   Lab Results  Component Value Date   CREATININE 1.23 01/12/2020    3. Other male erectile dysfunction Stable.  Will refill Cialis today upon request.  - Refill tadalafil (CIALIS) 20 MG tablet; Take 1 tablet (20 mg total) by mouth daily as needed.  Dispense: 30 tablet; Refill: 3  4. Obesity, current BMI 36 Course: Johnedward is currently in the action stage of change. As such, his goal is to continue with weight loss efforts.   Nutrition goals: He has agreed to keeping a food journal and adhering to recommended goals of 1500 calories and 100 grams of protein.   Exercise goals: As is.  Behavioral modification strategies: increasing lean protein intake, decreasing simple carbohydrates, increasing vegetables, and increasing water intake.  Papa has agreed to follow-up with our clinic in 4 weeks. He was informed of the importance of frequent follow-up visits to maximize his success with intensive lifestyle modifications for his multiple health conditions.   Objective:   Blood pressure 121/73, pulse 77, temperature 97.6 F (36.4 C), temperature source Oral, height 6\' 8"  (2.032 m), weight (!) 327 lb (148.3 kg), SpO2 99 %. Body mass index is 35.92 kg/m.  General: Cooperative, alert, well developed, in no acute distress. HEENT: Conjunctivae and lids unremarkable. Cardiovascular: Regular rhythm.  Lungs: Normal work of breathing. Neurologic: No focal deficits.   Lab Results  Component Value Date   CREATININE 1.23 01/12/2020   BUN 12 01/12/2020   NA 141 01/12/2020   K 4.7 01/12/2020   CL 105 01/12/2020  CO2 25 01/12/2020   Lab Results  Component Value Date   ALT 29 01/12/2020   AST 22 01/12/2020   ALKPHOS 54 01/12/2020   BILITOT 0.7 01/12/2020   Lab Results  Component Value Date   HGBA1C 5.9 (H) 06/10/2020   HGBA1C 5.5 01/12/2020   HGBA1C 6.7 (H) 07/06/2019   HGBA1C 6.3 (H) 04/07/2019   HGBA1C 9.5 (H) 11/25/2018   Lab Results  Component Value Date    TSH 1.260 04/07/2019   Lab Results  Component Value Date   CHOL 125 01/12/2020   HDL 43 01/12/2020   LDLCALC 71 01/12/2020   TRIG 47 01/12/2020   CHOLHDL 2.9 01/12/2020   Lab Results  Component Value Date   VD25OH 21.0 (L) 06/10/2020   VD25OH 23.9 (L) 01/12/2020   VD25OH 30.4 04/07/2019   Lab Results  Component Value Date   WBC 5.8 01/12/2020   HGB 14.1 01/12/2020   HCT 41.3 01/12/2020   MCV 87 01/12/2020   PLT 261 01/12/2020   Obesity Behavioral Intervention:   Approximately 15 minutes were spent on the discussion below.  ASK: We discussed the diagnosis of obesity with Lugene today and Baker agreed to give [name] permission to discuss obesity behavioral modification therapy today.  ASSESS: Achilles has the diagnosis of obesity and his BMI today is 36. Maycen is in the action stage of change.   ADVISE: Alphons was educated on the multiple health risks of obesity as well as the benefit of weight loss to improve his health. He was advised of the need for long term treatment and the importance of lifestyle modifications to improve his current health and to decrease his risk of future health problems.  AGREE: Multiple dietary modification options and treatment options were discussed and Bryndan agreed to follow the recommendations documented in the above note.  ARRANGE: Frederik was educated on the importance of frequent visits to treat obesity as outlined per CMS and USPSTF guidelines and agreed to schedule his next follow up appointment today.  Attestation Statements:   Reviewed by clinician on day of visit: allergies, medications, problem list, medical history, surgical history, family history, social history, and previous encounter notes.  Carlos Levering Friedenbach, CMA, am acting as Energy manager for [employer], DO.  I have reviewed the above documentation for accuracy and completeness, and I agree with the above. Helane Rima, DO

## 2020-09-15 ENCOUNTER — Encounter: Payer: Self-pay | Admitting: Neurology

## 2020-09-15 ENCOUNTER — Ambulatory Visit: Payer: Medicare Other | Admitting: Neurology

## 2020-09-15 VITALS — BP 133/82 | HR 69 | Ht >= 80 in | Wt 336.4 lb

## 2020-09-15 DIAGNOSIS — G4733 Obstructive sleep apnea (adult) (pediatric): Secondary | ICD-10-CM | POA: Diagnosis not present

## 2020-09-15 DIAGNOSIS — Z9989 Dependence on other enabling machines and devices: Secondary | ICD-10-CM | POA: Diagnosis not present

## 2020-09-15 NOTE — Progress Notes (Signed)
Subjective:    Patient ID: Brad [name] is a 60 y.o. male.    Interim history:   Brad [name] is a 60 year old right-handed gentleman with an underlying medical history of hypertension, prior lacunar strokes, recent hemorrhagic stroke in April 2016, recent smoking cessation, and obesity, who presents for follow up consultation of his obstructive sleep apnea, on CPAP therapy, for his yearly checkup. The patient is unaccompanied today. I last saw him on 09/16/2019, at which time he was fully compliant with his CPAP of 7 cm.  He was doing well and advised to follow-up routinely in 1 year.  Today, 09/15/2020: I reviewed his CPAP compliance data from the past 30 days from 08/16/2020 through 09/14/2020, during which time he used his machine 100% with percent use days greater than 4 hours also at 100%, indicating superb compliance.  Average usage of 7 hours and 54 minutes, residual AHI at goal at 0.4/h, 95th percentile of leak at 12.7 L/min on a pressure of 7 cm with EPR of 1.  He needs new supplies.  He uses a nasal mask.  He indicates ongoing good results with CPAP and no problems using it.  He has been working on weight loss and goes to the weight management clinic.  He has been on prescription vitamin D and was recently started on chlorthalidone.  Overall, he is doing well.     Previously:   He missed an appointment on 08/12/19. I saw him on 08/11/2018, At which time he was compliant with his CPAP and doing well.  He was advised to follow-up in 1 year routinely.    I reviewed his CPAP compliance data from 08/16/2019 through 09/14/2019, which is a total of 30 days, during which time he used his machine every night with percent use days greater than 4 hours at 100%, indicating superb compliance with an average usage of 7 hours and 25 minutes, residual AHI at goal at 0.5/h, leak acceptable with a 95th percentile at 12.4 L/min on a pressure of 7 cm with EPR of 1.    I saw him on 08/08/2017, at which time he was  compliant with his CPAP and doing well.  He was working on weight loss.  He was advised to follow-up in 1 year.    I reviewed his CPAP compliance data from 07/08/2018 through 078 2020 which is a total of 30 days, during which time he used his machine 29 days with percent used days greater than 4 hours at 97%, indicating excellent compliance with an average usage of 7 hours and 21 minutes, residual AHI at goal at 0.3/h, leak acceptable with a 95th percentile at 13.4 L/min on a pressure of 7 cm with EPR of 1.  Set up date was 10/18/2014.     I reviewed his CPAP compliance data from 07/07/2017 through 08/05/2017 which is a total of 30 days, during which time he used his CPAP every night with percent used days greater than 4 hours at 97%, indicating excellent compliance with an average usage of 5 hours and 53 minutes, residual AHI at goal at 0.5 per hour, leak acceptable with the 95th percentile at 7.9 L/m on a pressure of 7 cm with EPR of 1.   I saw him on 08/08/2016, at which time he was compliant with CPAP and reported ongoing good results. Working on weight loss as well.   I reviewed his CPAP compliance data from 07/08/2016 through 08/06/2016, which is a total of 30 days, during which time  he used his CPAP every night with percent used days greater than 4 hours at 90%, indicating excellent compliance with an average usage of 5 hours and 3 minutes, residual AHI low at 0.5 per hour, leak acceptable with the 95th percentile at 14.6 L/m on a pressure of 7 cm with EPR of 1.    I saw him 02/09/2015, at which time he reported doing fairly well. He was tolerating CPAP and he was compliant with treatment. He indicated that he would wake up rested and daytime energy was better. He was working on weight loss and exercising regularly. We talked about is his baseline sleep study results from August 2016 and his CPAP titration study results from September 2016 in detail at the time. He was encouraged to continue with CPAP  therapy regularly.   I reviewed his CPAP compliance data from 07/09/2015 through 08/07/2015 which is a total of 30 days during which time he used his machine 24 days with percent used days greater than 4 hours at 60%, indicating suboptimal compliance with an average usage for all nights of 3 hours and 3 minutes, residual AHI 1.1 per hour, leak acceptable with the 95th percentile at 15.6 L/m on a pressure of 7 cm with EPR of 1.   I first met him on 07/20/2014 at the request of Dr. Jaynee Eagles, at which time the patient reported snoring, multiple nighttime awakenings, nocturia and excessive daytime somnolence. I invited him back for sleep study. He had a baseline sleep study, followed by a CPAP titration study. I went over his test results with him in detail today. His baseline sleep study from 09/03/2014 showed a sleep efficiency of 63.9% with a latency to sleep of 81.5 minutes, and wake after sleep onset of 78 minutes with mild to moderate sleep fragmentation noted. He had an increased percentage of stage II sleep, absence of slow-wave sleep and REM sleep at 26.1% with a REM latency of 56 minutes. He had no significant PLMS, EKG or EEG changes. He had mild to moderate snoring. He did not have any supine sleep. Total AHI was 10 per hour, rising to 20.4 per hour during REM sleep. Average oxygen saturation was 96%, nadir was 90%. Given his sleep related complaints and his history of stroke, I invited him back for a full night CPAP titration study. He had this on 10/08/2014: Sleep efficiency was 73.6% with a latency of sleep of 13.5 minutes and wake after sleep onset of 87 minutes with mild sleep fragmentation noted. He had an increased percentage of stage 2 sleep, absence of slow wave sleep and increased REM sleep at 34.8% with a mildly reduced REM latency of 51.5 minutes. He had no significant PLMS, EKG or EEG changes. Average oxygen saturation was 94%, nadir was 90%. CPAP was titrated from 5 cm to 8 cm. AHI was 0 per  hour on a pressure of 7 cm. Based on his test results I prescribed CPAP therapy for home use.   I reviewed his CPAP compliance data from 01/09/2015 through 02/07/2015 which is a total of 30 days during which time he used his machine 27 days with percent used days greater than 4 hours at 80%, indicating very good compliance with an average usage of 4 hours and 52 minutes, residual AHI low at 0.7 per hour, leak at times high with the 95th percentile at 28.7 L/m on a pressure of 7 cm with EPR of 1.   07/20/2014: He reports snoring, has multiple nighttime awakenings, has  nocturia and excessive daytime somnolence. His ESS is 20/24. His snoring can be loud according to his wife, who provides most of his history. His bedtime varies. He may be in bed around 10 PM but typically does not fall asleep until late at night. He watches TV in bed. He may fall asleep around 2 AM. He goes to the bathroom multiple times each night, an average of 5 times per night. He denies morning headaches. He's currently not working. He works in Psychologist, educational at [employer]. He works 12 hour shifts, from 7 AM to 7 PM. He works in [place]. He lives in [place]. He denies restless leg symptoms. He's not known to twitch in his sleep according to his wife. He denies peripheral edema. He does not drink caffeine every day. He denies alcohol consumption. He quit smoking in April 2016. His rise time varies. He may be up by 5 AM.   His Past Medical History Is Significant For: Past Medical History:  Diagnosis Date   Abnormal ultrasound of carotid artery 04/2014   no significant obstruction   Ataxia 04/2014   Diabetes ([place])    EKG abnormalities 04/2014   poor R wave progression, NSR   Former smoker    25 years x 1.5 ppd, stopped 04/2014   Gait disturbance 04/2014   s/p stroke   H/O echocardiogram 04/2014   moderate LVH, 60-65% EF, no valve disease   History of heart attack    Hypertensive heart disease 04/2014   Intraparenchymal  hemorrhage of brain ([place]) 05/05/2014   hospitalization [facility]   Obesity    Prediabetes    Short-term memory loss 04/2014   Sleep apnea    Stroke ([place]) 04/2014    His Past Surgical History Is Significant For: Past Surgical History:  Procedure Laterality Date   APPENDECTOMY     CHOLECYSTECTOMY     CIRCUMCISION     HEMORRHOID SURGERY      His Family History Is Significant For: Family History  Problem Relation Age of Onset   Heart disease Mother    Hypertension Mother    Sudden death Mother    Hypertension Brother    Aneurysm Paternal Grandmother     His Social History Is Significant For: Social History   Socioeconomic History   Marital status: Married    Spouse name: Web designer   Number of children: 2   Years of education: 12   Highest education level: Not on file  Occupational History   Occupation: Retired   Tobacco Use   Smoking status: Former    Packs/day: 1.50    Years: 25.00    Pack years: 37.50    Types: Cigarettes    Quit date: 05/05/2014    Years since quitting: 6.3   Smokeless tobacco: Never  Vaping Use   Vaping Use: Never used  Substance and Sexual Activity   Alcohol use: No    Alcohol/week: 0.0 standard drinks   Drug use: No   Sexual activity: Not on file  Other Topics Concern   Not on file  Social History Narrative   Lives at home with wife   Married to [employer], non denominational, works at [employer] in [place], [place].  From [place], but moved to [place] 2016.     Caffeine use: Drinks tea occass   Social Determinants of Health   Financial Resource Strain: Not on file  Food Insecurity: Not on file  Transportation Needs: Not on file  Physical Activity: Not on file  Stress: Not on file  Social Connections: Not on file     His Allergies Are:  No Known Allergies:   His Current Medications Are:  Outpatient Encounter Medications as of 09/15/2020  Medication Sig   amLODipine (NORVASC) 10 MG tablet Take 1  tablet (10 mg total) by mouth daily.   blood glucose meter kit and supplies KIT Dispense based on patient and insurance preference. Check fasting blood sugar once daily ICD10 R73.03   chlorthalidone (HYGROTON) 25 MG tablet Take 1 tablet (25 mg total) by mouth daily.   glucose blood test strip Use as instructed to monitor FSBS 1x daily. Dx: R73.09   labetalol (NORMODYNE) 200 MG tablet TAKE 1 TABLET(200 MG) BY MOUTH TWICE DAILY   Lancets (ONETOUCH ULTRASOFT) lancets Use as instructed   losartan (COZAAR) 100 MG tablet Take 1 tablet (100 mg total) by mouth daily.   tadalafil (CIALIS) 20 MG tablet Take 1 tablet (20 mg total) by mouth daily as needed.   Vitamin D, Ergocalciferol, (DRISDOL) 1.25 MG (50000 UNIT) CAPS capsule Take 1 capsule (50,000 Units total) by mouth every 7 (seven) days.   No facility-administered encounter medications on file as of 09/15/2020.  :  Review of Systems:  Out of a complete 14 point review of systems, all are reviewed and negative with the exception of these symptoms as listed below:  Review of Systems  Neurological:        Pt is here for I year follow up for CPAP pt states that are no complaints.   Epworth Sleepiness Scale 0= would never doze 1= slight chance of dozing 2= moderate chance of dozing 3= high chance of dozing  Sitting and reading:2 Watching TV:2 Sitting inactive in a public place (ex. Theater or meeting):1 As a passenger in a car for an hour without a break:2 Lying down to rest in the afternoon:3 Sitting and talking to someone:0 Sitting quietly after lunch (no alcohol):2 In a car, while stopped in traffic:1 Total: 13   Objective:  Neurological Exam  Physical Exam Physical Examination:   Vitals:   09/15/20 0941  BP: 133/82  Pulse: 69    General Examination: The patient is a very pleasant 60 y.o. male in no acute distress. He appears well-developed and well-nourished and well groomed.   HEENT: Normocephalic, atraumatic, pupils are  equal, round and reactive to light, tracking is good without limitation to gaze excursion or nystagmus noted. Normal smooth pursuit is noted. Hearing is grossly intact. Face is symmetric with normal facial animation, speech is clear, no hypophonia. There is no lip, neck/head, jaw or voice tremor. Oropharynx exam reveals: No significant mouth dryness, stable findings, with airway crowding. Tongue protrudes centrally and palate elevates symmetrically. No carotid bruits.   Chest: Clear to auscultation without wheezing, rhonchi or crackles noted.   Heart: S1+S2+0, regular and normal without murmurs, rubs or gallops noted.    Abdomen: Soft, non-tender and non-distended.   Extremities: There are no new findings, no edema.    Skin: Warm and dry without trophic changes noted.    Musculoskeletal: exam reveals no obvious joint deformities.    Neurologically:  Mental status: The patient is awake, alert and oriented in all 4 spheres. His immediate and remote memory, attention, language skills and fund of knowledge are appropriate. There is no evidence of aphasia, agnosia, apraxia or anomia. Speech is clear with normal prosody and enunciation. Thought process is linear. Mood and affect are normal.  Cranial nerves II - XII are as described  above under HEENT exam. Motor exam: Normal bulk, strength and tone is noted. There is no tremor or rebound. Fine motor skills are grossly intact.  Sensory exam: intact to light touch.  Gait, station and balance: He stands without difficulty. Posture is age-appropriate. Gait shows normal stride length and normal pace. No problems turning are noted.                  Assessment and Plan:    In summary, Nikki Rusnak is a very pleasant 60 year old male with an underlying medical history of hypertension, hyperlipidemia, lacunar strokes, hemorrhagic stroke in April 2016, and obesity, who presents for follow-up consultation of his obstructive sleep apnea, well established on CPAP  therapy at 7 cm with ongoing good results reported and excellent compliance.  Of note, he had a baseline sleep study in August 2016, and a subsequent CPAP titration study in September 2016 and has since then establish treatment at home. He has been working on weight loss, goes to the gym 6 days a week.  In addition, he goes to the medical weight management clinic. He is up-to-date with PAP supplies typically.  I renewed his prescription which we will send to his DME company.  He is commended for his treatment adherence with his CPAP and advised to follow-up routinely in 1 year.  I answered all his questions today and he was in agreement.I spent 20 minutes in total face-to-face time and in reviewing records during pre-charting, more than 50% of which was spent in counseling and coordination of care, reviewing test results, reviewing medications and treatment regimen and/or in discussing or reviewing the diagnosis of OSA, the prognosis and treatment options. Pertinent laboratory and imaging test results that were available during this visit with the patient were reviewed by me and considered in my medical decision making (see chart for details).

## 2020-09-15 NOTE — Patient Instructions (Signed)
Please continue using your CPAP regularly. While your insurance requires that you use CPAP at least 4 hours each night on 70% of the nights, I recommend, that you not skip any nights and use it throughout the night if you can. Getting used to CPAP and staying with the treatment long term does take time and patience and discipline. Untreated obstructive sleep apnea when it is moderate to severe can have an adverse impact on cardiovascular health and raise her risk for heart disease, arrhythmias, hypertension, congestive heart failure, stroke and diabetes. Untreated obstructive sleep apnea causes sleep disruption, nonrestorative sleep, and sleep deprivation. This can have an impact on your day to day functioning and cause daytime sleepiness and impairment of cognitive function, memory loss, mood disturbance, and problems focussing. Using CPAP regularly can improve these symptoms. Keep up the good work, see you in 1 year!

## 2020-09-28 NOTE — Progress Notes (Addendum)
Order sent to [facility] ([facility]). Blair Promise, RN got it!

## 2020-10-04 ENCOUNTER — Encounter (INDEPENDENT_AMBULATORY_CARE_PROVIDER_SITE_OTHER): Payer: Self-pay | Admitting: Family Medicine

## 2020-10-04 DIAGNOSIS — E1169 Type 2 diabetes mellitus with other specified complication: Secondary | ICD-10-CM

## 2020-10-05 NOTE — Telephone Encounter (Signed)
Pt last seen by Dr. Wallace.  

## 2020-10-06 ENCOUNTER — Telehealth (INDEPENDENT_AMBULATORY_CARE_PROVIDER_SITE_OTHER): Payer: Self-pay

## 2020-10-06 MED ORDER — OZEMPIC (1 MG/DOSE) 4 MG/3ML ~~LOC~~ SOPN: 1.0000 mg | PEN_INJECTOR | SUBCUTANEOUS | 0 refills | Status: DC

## 2020-10-06 MED ORDER — OZEMPIC (1 MG/DOSE) 4 MG/3ML ~~LOC~~ SOPN: 2.0000 mg | PEN_INJECTOR | SUBCUTANEOUS | 0 refills | Status: DC

## 2020-10-06 NOTE — Telephone Encounter (Signed)
Prior authorization is not required for Ozempic.  

## 2020-10-06 NOTE — Telephone Encounter (Signed)
Brad [name] at [facility] wants to confirm dosage for Ozempic is 2x per week.  If so, it will need a PA.  Please advise.  Thank you

## 2020-10-06 NOTE — Telephone Encounter (Signed)
Dr.Wallace °

## 2020-10-06 NOTE — Telephone Encounter (Signed)
Last OV with Dr Wallace 

## 2020-10-10 ENCOUNTER — Encounter (INDEPENDENT_AMBULATORY_CARE_PROVIDER_SITE_OTHER): Payer: Self-pay

## 2020-10-17 ENCOUNTER — Other Ambulatory Visit: Payer: Self-pay

## 2020-10-17 ENCOUNTER — Encounter (INDEPENDENT_AMBULATORY_CARE_PROVIDER_SITE_OTHER): Payer: Self-pay | Admitting: Family Medicine

## 2020-10-17 ENCOUNTER — Ambulatory Visit (INDEPENDENT_AMBULATORY_CARE_PROVIDER_SITE_OTHER): Payer: Medicare Other | Admitting: Family Medicine

## 2020-10-17 VITALS — BP 111/71 | HR 78 | Temp 97.8°F | Ht >= 80 in | Wt 327.0 lb

## 2020-10-17 DIAGNOSIS — E1169 Type 2 diabetes mellitus with other specified complication: Secondary | ICD-10-CM | POA: Diagnosis not present

## 2020-10-17 DIAGNOSIS — I152 Hypertension secondary to endocrine disorders: Secondary | ICD-10-CM

## 2020-10-17 DIAGNOSIS — E1159 Type 2 diabetes mellitus with other circulatory complications: Secondary | ICD-10-CM

## 2020-10-17 DIAGNOSIS — E559 Vitamin D deficiency, unspecified: Secondary | ICD-10-CM | POA: Diagnosis not present

## 2020-10-17 DIAGNOSIS — Z6837 Body mass index (BMI) 37.0-37.9, adult: Secondary | ICD-10-CM

## 2020-10-17 MED ORDER — TIRZEPATIDE 7.5 MG/0.5ML ~~LOC~~ SOAJ: 7.5000 mg | SUBCUTANEOUS | 0 refills | Status: DC

## 2020-10-18 ENCOUNTER — Encounter (INDEPENDENT_AMBULATORY_CARE_PROVIDER_SITE_OTHER): Payer: Self-pay

## 2020-10-18 NOTE — Telephone Encounter (Signed)
Last OV with Dr Wallace 

## 2020-10-18 NOTE — Progress Notes (Signed)
Chief Complaint:   OBESITY Brad [name] is here to discuss his progress with his obesity treatment plan along with follow-up of his obesity related diagnoses.   Today's visit was #: 19 Starting weight: 334 lbs Starting date: 04/07/2019 Today's weight: 327 lbs Today's date: 10/17/2020 Weight change since last visit: 0 Total lbs lost to date: 7 lbs Body mass index is 35.92 kg/m.  Total weight loss percentage to date: -2.10%  Current Meal Plan: keeping a food journal and adhering to recommended goals of 1500 calories and 100 grams of protein for 45% of the time.  Current Exercise Plan: Cardio for 90 minutes 5 times per week. Current Anti-Obesity Medications: Ozempic 1 mg subcutaneously twice daily. Side effects: None.  Interim History:  Brad [name]'s body fat percentage has decreased.  He says he is frustrated with the lack of scale movement.    Plan:  Check labs at next visit.  Assessment/Plan:   1. Type 2 diabetes mellitus with other specified complication, without long-term current use of insulin (HCC) Diabetes Mellitus: Not at goal. Medication: Ozempic 1 mg subcutaneously twice daily.   Plan:  Stop Ozempic and start Mounjaro 7.5 mg subcutaneously weekly. The patient will continue to focus on protein-rich, low simple carbohydrate foods. We reviewed the importance of hydration, regular exercise for stress reduction, and restorative sleep.   Lab Results  Component Value Date   HGBA1C 5.9 (H) 06/10/2020   HGBA1C 5.5 01/12/2020   HGBA1C 6.7 (H) 07/06/2019   Lab Results  Component Value Date   MICROALBUR 0.3 02/06/2017   LDLCALC 71 01/12/2020   CREATININE 1.23 01/12/2020   - Start tirzepatide (MOUNJARO) 7.5 MG/0.5ML Pen; Inject 7.5 mg into the skin once a week.  Dispense: 2 mL; Refill: 0  2. Hypertension associated with type 2 diabetes mellitus (HCC) At goal. Medications: Norvasc 10 mg daily, chlorthalidone 25 mg daily, labetalol 200 mg twice daily, Cozaar 100 mg daily.   Plan:  Avoid buying foods that are: processed, frozen, or prepackaged to avoid excess salt. We will watch for signs of hypotension as he continues lifestyle modifications.  BP Readings from Last 3 Encounters:  10/17/20 111/71  09/15/20 133/82  09/12/20 121/73   Lab Results  Component Value Date   CREATININE 1.23 01/12/2020   3. Vitamin D deficiency Not at goal.  He is taking vitamin D 50,000 IU weekly.  Plan: Continue to take prescription Vitamin D @50 ,000 IU every week as prescribed.  Follow-up for routine testing of Vitamin D, at least 2-3 times per year to avoid over-replacement.  Lab Results  Component Value Date   VD25OH 21.0 (L) 06/10/2020   VD25OH 23.9 (L) 01/12/2020   VD25OH 30.4 04/07/2019   4. Obesity, current BMI 36  Course: Brad [name] is currently in the action stage of change. As such, his goal is to continue with weight loss efforts.   Nutrition goals: He has agreed to keeping a food journal and adhering to recommended goals of 1500 calories and 100 grams of protein.   Exercise goals:  As is.  Behavioral modification strategies: increasing lean protein intake, decreasing simple carbohydrates, and increasing vegetables.  Brad [name] has agreed to follow-up with our clinic in 4 weeks. He was informed of the importance of frequent follow-up visits to maximize his success with intensive lifestyle modifications for his multiple health conditions.   Objective:   Blood pressure 111/71, pulse 78, temperature 97.8 F (36.6 C), temperature source Oral, height 6\' 8"  (2.032 m), weight (!) 327 lb (148.3  kg), SpO2 100 %. Body mass index is 35.92 kg/m.  General: Cooperative, alert, well developed, in no acute distress. HEENT: Conjunctivae and lids unremarkable. Cardiovascular: Regular rhythm.  Lungs: Normal work of breathing. Neurologic: No focal deficits.   Lab Results  Component Value Date   CREATININE 1.23 01/12/2020   BUN 12 01/12/2020   NA 141 01/12/2020   K 4.7 01/12/2020    CL 105 01/12/2020   CO2 25 01/12/2020   Lab Results  Component Value Date   ALT 29 01/12/2020   AST 22 01/12/2020   ALKPHOS 54 01/12/2020   BILITOT 0.7 01/12/2020   Lab Results  Component Value Date   HGBA1C 5.9 (H) 06/10/2020   HGBA1C 5.5 01/12/2020   HGBA1C 6.7 (H) 07/06/2019   HGBA1C 6.3 (H) 04/07/2019   HGBA1C 9.5 (H) 11/25/2018   Lab Results  Component Value Date   TSH 1.260 04/07/2019   Lab Results  Component Value Date   CHOL 125 01/12/2020   HDL 43 01/12/2020   LDLCALC 71 01/12/2020   TRIG 47 01/12/2020   CHOLHDL 2.9 01/12/2020   Lab Results  Component Value Date   VD25OH 21.0 (L) 06/10/2020   VD25OH 23.9 (L) 01/12/2020   VD25OH 30.4 04/07/2019   Lab Results  Component Value Date   WBC 5.8 01/12/2020   HGB 14.1 01/12/2020   HCT 41.3 01/12/2020   MCV 87 01/12/2020   PLT 261 01/12/2020   Obesity Behavioral Intervention:   Approximately 15 minutes were spent on the discussion below.  ASK: We discussed the diagnosis of obesity with [name] today and [name] agreed to give [name] permission to discuss obesity behavioral modification therapy today.  ASSESS: [name] has the diagnosis of obesity and his BMI today is 36.0. [name] is in the action stage of change.   ADVISE: [name] was educated on the multiple health risks of obesity as well as the benefit of weight loss to improve his health. He was advised of the need for long term treatment and the importance of lifestyle modifications to improve his current health and to decrease his risk of future health problems.  AGREE: Multiple dietary modification options and treatment options were discussed and [name] agreed to follow the recommendations documented in the above note.  ARRANGE: [name] was educated on the importance of frequent visits to treat obesity as outlined per CMS and USPSTF guidelines and agreed to schedule his next follow up appointment today.  Attestation Statements:   Reviewed by clinician on  day of visit: allergies, medications, problem list, medical history, surgical history, family history, social history, and previous encounter notes.  I, Insurance claims handler, CMA, am acting as transcriptionist for Helane Rima, DO  I have reviewed the above documentation for accuracy and completeness, and I agree with the above. Helane Rima, DO

## 2020-11-02 ENCOUNTER — Ambulatory Visit: Payer: Medicare Other | Admitting: Family Medicine

## 2020-11-02 ENCOUNTER — Other Ambulatory Visit: Payer: Self-pay

## 2020-11-02 ENCOUNTER — Encounter: Payer: Self-pay | Admitting: Family Medicine

## 2020-11-02 VITALS — BP 134/82 | HR 71 | Ht >= 80 in | Wt 334.5 lb

## 2020-11-02 DIAGNOSIS — Z23 Encounter for immunization: Secondary | ICD-10-CM | POA: Diagnosis not present

## 2020-11-02 DIAGNOSIS — E119 Type 2 diabetes mellitus without complications: Secondary | ICD-10-CM | POA: Diagnosis not present

## 2020-11-02 DIAGNOSIS — I10 Essential (primary) hypertension: Secondary | ICD-10-CM | POA: Diagnosis not present

## 2020-11-02 DIAGNOSIS — E559 Vitamin D deficiency, unspecified: Secondary | ICD-10-CM

## 2020-11-02 LAB — POCT GLYCOSYLATED HEMOGLOBIN (HGB A1C): HbA1c, POC (controlled diabetic range): 6.2 % (ref 0.0–7.0)

## 2020-11-02 MED ORDER — OZEMPIC (0.25 OR 0.5 MG/DOSE) 2 MG/1.5ML ~~LOC~~ SOPN: 0.5000 mg | PEN_INJECTOR | SUBCUTANEOUS | 0 refills | Status: DC

## 2020-11-02 NOTE — Patient Instructions (Signed)
It was great seeing you today!  Please check-out at the front desk before leaving the clinic. I'd like to see you back in 3 months but if you need to be seen earlier than that for any new issues we're happy to fit you in, just give [name] a call!  Visit Remembers: - Stop by the pharmacy to pick up your prescriptions  - Continue to work on your healthy eating habits and incorporating exercise into your daily life. (see below) - Your goal is to have an A1c < 7 - Medicine Changes: Continue Ozempic 2 mg weekly until you can get covered for [facility]. The pharmacy team will call you to discuss the application   Diet Recommendations for Diabetes  Carbohydrate includes starch, sugar, and fiber.  Of these, only sugar and starch raise blood glucose.  (Fiber is found in fruits, vegetables [especially skin, seeds, and stalks] and whole grains.)   Starchy (carb) foods: Bread, rice, pasta, potatoes, corn, cereal, grits, crackers, bagels, muffins, all baked goods.  (Fruit, milk, and yogurt also have carbohydrate, but most of these foods will not spike your blood sugar as most starchy foods will.)  A few fruits do cause high blood sugars; use small portions of bananas (limit to 1/2 at a time), grapes, watermelon, oranges, and most tropical fruits.   Protein foods: Meat, fish, poultry, eggs, dairy foods, and beans such as pinto and kidney beans (beans also provide carbohydrate).   1. Eat at least REAL 3 meals and 1-2 snacks per day. Never go more than 4-5 hours while awake without eating. Eat breakfast within the first hour of getting up.   2. Limit starchy foods to TWO per meal and ONE per snack. ONE portion of a starchy food is equal to the following:   - ONE slice of bread (or its equivalent, such as half of a hamburger bun).   - 1/2 cup of a "scoopable" starchy food such as potatoes or rice.   - 15 grams of Total Carbohydrate as shown on food label.   - Every 4 ounces of a sweet drink (including fruit  juice). 3. Include at every meal: a protein food, a carb food, and vegetables and/or fruit.   - Obtain twice the volume of veg's as protein or carbohydrate foods for both lunch and dinner.   - Fresh or frozen veg's are best.   - Keep frozen veg's on hand for a quick vegetable serving.       Regarding lab work today:  Due to recent changes in healthcare laws, you may see the results of your imaging and laboratory studies on MyChart before your doctor has had a chance to review them.  We understand that in some cases there may be results that are confusing or concerning to you. Not all laboratory results come back in the same time frame and your doctor may be waiting for multiple results in order to interpret others.  Please give [name] 72 hours in order for your doctor to thoroughly review all the results before contacting the office for clarification of your results. If everything is normal, you will get a letter in the mail or a message in My Chart. Please give [name] a call if you do not hear from [name] after 2 weeks.  Please bring all of your medications with you to each visit.    If you haven't already, sign up for My Chart to have easy access to your labs results, and communication with your primary  care physician.  Feel free to call with any questions or concerns at any time, at [phone].   Take care,  Dr. Rushie [facility]

## 2020-11-02 NOTE — Progress Notes (Signed)
   SUBJECTIVE:   CHIEF COMPLAINT / HPI:   Chief Complaint  Patient presents with   Follow-up    a1c     Brad [name] is a 60 y.o. male here for follow-up of his chronic health conditions:   HTN Takes chlorthalidone, amlodipine, labetalol, losartan. Denies missing doses of antihypertensive medications. Denies chest pain, palpitations, lower extremity edema, exertional dyspnea, lightheadedness, headaches and vision changes.    Diabetes Mellitus  Continues to workout at the gym most days of the week.  Denies missing any doses of DM medications.  Was switched to [facility] however states that this medication is more expensive than the Ozempic and would like to go to Ozempic. Marland Kitchen   PERTINENT  PMH / PSH: reviewed and updated as appropriate   OBJECTIVE:   BP 134/82   Pulse 71   Ht 6\' 8"  (2.032 m)   Wt (!) 334 lb 8 oz (151.7 kg)   SpO2 100%   BMI 36.75 kg/m    GEN: pleasant well appearing male, in no acute distress  CV: regular rate and rhythm, no murmurs appreciated  RESP: no increased work of breathing, clear to ascultation bilaterally MSK: no edema, normal ROM SKIN: warm, dry, no rash on visible skin    ASSESSMENT/PLAN:   Type 2 diabetes mellitus (HCC) A1c today 6.2  and previously 5.9. Weight management physician switched to [facility]. He is not taking Mounjaro as states $70 co-pay whereas Ozempic was $40 co-pay. Discussed with pharmacy to see if he qualifies for a patient assistance program. 2 mg Ozempic sample provided until pharmacy evaluation. BMP today.  Previous labs are reviewed.   Encouraged to continue to work out.  Praised patient on his lifestyle changes having a stroke.  Follow-up in 3 months Encouraged continued diet rich in vegetables and complex carbs.  Heart healthy carb modified diet. Counseled on need to continue exercising.   Statin therapy: needs ACEi/ARB: Losartan  Urine microalbumine: at next visit Eye exam: follows with [facility]. [facility] in [place], UTD per  pt  Essential hypertension BP at goal. No changes to medications.   BMP today.  Vitamin D deficiency Reviewed previous labs.  Patient has completed supplementation.  Recheck vitamin D level today.    [place], DO PGY-3, [facility] 11/02/2020

## 2020-11-03 ENCOUNTER — Telehealth: Payer: Self-pay

## 2020-11-03 LAB — BASIC METABOLIC PANEL
BUN/Creatinine Ratio: 12 (ref 9–20)
BUN: 16 mg/dL (ref 6–24)
CO2: 22 mmol/L (ref 20–29)
Calcium: 9.7 mg/dL (ref 8.7–10.2)
Chloride: 100 mmol/L (ref 96–106)
Creatinine, Ser: 1.32 mg/dL — ABNORMAL HIGH (ref 0.76–1.27)
Glucose: 115 mg/dL — ABNORMAL HIGH (ref 70–99)
Potassium: 3.8 mmol/L (ref 3.5–5.2)
Sodium: 138 mmol/L (ref 134–144)
eGFR: 63 mL/min/{1.73_m2} (ref >59)

## 2020-11-03 LAB — VITAMIN D 25 HYDROXY (VIT D DEFICIENCY, FRACTURES): Vit D, 25-Hydroxy: 30.5 ng/mL (ref 30.0–100.0)

## 2020-11-03 NOTE — Telephone Encounter (Signed)
Called pharmacy to f/u on copays of medications after PA's were approved.  Mounjaro $70 - 1 month supply Ozempic 8mg  pen $30 - 1 month supply  Mounjaro has no patient assistance programs yet. Ozempic can be rec'd through if needed.  Pt does have sample of Ozempic he rec'd 11/02/20 (placed as sample on med list by Dr. 01/02/21).  Will reach out to Dr. Rachael Darby for suggestions.

## 2020-11-06 ENCOUNTER — Encounter: Payer: Self-pay | Admitting: Family Medicine

## 2020-11-06 NOTE — Assessment & Plan Note (Signed)
Reviewed previous labs.  Patient has completed supplementation.  Recheck vitamin D level today.

## 2020-11-06 NOTE — Assessment & Plan Note (Addendum)
BP at goal. No changes to medications.   BMP today.

## 2020-11-06 NOTE — Assessment & Plan Note (Signed)
A1c today 6.2  and previously 5.9. Weight management physician switched to [facility]. He is not taking Mounjaro as states $70 co-pay whereas Ozempic was $40 co-pay. Discussed with pharmacy to see if he qualifies for a patient assistance program. 2 mg Ozempic sample provided until pharmacy evaluation. BMP today.  Previous labs are reviewed.   Encouraged to continue to work out.  Praised patient on his lifestyle changes having a stroke.  Follow-up in 3 months Encouraged continued diet rich in vegetables and complex carbs.  Heart healthy carb modified diet. Counseled on need to continue exercising.   Statin therapy: needs ACEi/ARB: Losartan  Urine microalbumine: at next visit Eye exam: follows with Dr. Suzan Slick in [place], UTD per pt

## 2020-11-22 ENCOUNTER — Encounter (INDEPENDENT_AMBULATORY_CARE_PROVIDER_SITE_OTHER): Payer: Self-pay | Admitting: Family Medicine

## 2020-11-22 ENCOUNTER — Ambulatory Visit (INDEPENDENT_AMBULATORY_CARE_PROVIDER_SITE_OTHER): Payer: Medicare Other | Admitting: Family Medicine

## 2020-11-22 ENCOUNTER — Other Ambulatory Visit: Payer: Self-pay

## 2020-11-22 VITALS — BP 119/78 | HR 65 | Temp 98.0°F | Ht >= 80 in | Wt 334.0 lb

## 2020-11-22 DIAGNOSIS — R609 Edema, unspecified: Secondary | ICD-10-CM | POA: Diagnosis not present

## 2020-11-22 DIAGNOSIS — Z6837 Body mass index (BMI) 37.0-37.9, adult: Secondary | ICD-10-CM

## 2020-11-22 DIAGNOSIS — N528 Other male erectile dysfunction: Secondary | ICD-10-CM

## 2020-11-22 DIAGNOSIS — E1169 Type 2 diabetes mellitus with other specified complication: Secondary | ICD-10-CM

## 2020-11-23 MED ORDER — TADALAFIL 20 MG PO TABS: 20.0000 mg | ORAL_TABLET | Freq: Every day | ORAL | 3 refills | Status: DC | PRN

## 2020-11-23 MED ORDER — SEMAGLUTIDE (2 MG/DOSE) 8 MG/3ML ~~LOC~~ SOPN: 2.0000 mg | PEN_INJECTOR | SUBCUTANEOUS | 3 refills | Status: DC

## 2020-11-23 MED ORDER — CHLORTHALIDONE 25 MG PO TABS: 25.0000 mg | ORAL_TABLET | Freq: Every day | ORAL | 2 refills | Status: DC

## 2020-11-24 NOTE — Progress Notes (Signed)
Chief Complaint:   OBESITY Brad [name] is here to discuss his progress with his obesity treatment plan along with follow-up of his obesity related diagnoses. See Medical Weight Management Flowsheet for complete bioelectrical impedance results.  Today's visit was #: 20 Starting weight: 334 lbs Starting date: 04/07/2019 Weight change since last visit: +7 lbs Total lbs lost to date: 0  Nutrition Plan: Keeping a food journal and adhering to recommended goals of 1500 calories and 100 grams of protein daily for 50% of the time. Activity: Cardio for 90-120 minutes 5 times per week.  Anti-obesity medications: Ozempic 2 mg subcutaneously weekly. Reported side effects: None.  Interim History: Brad [name] reports doing more off plan eating.  He says he is ready to restart the plan.  Assessment/Plan:   1. Type 2 diabetes mellitus with other specified complication, without long-term current use of insulin (HCC) Diabetes Mellitus: Not at goal. Medication: Ozempic 2 mg subcutaneously weekly.   Plan:  Refill Ozempic 2 mg subcutaneously weekly, as per below. The patient will continue to focus on protein-rich, low simple carbohydrate foods. We reviewed the importance of hydration, regular exercise for stress reduction, and restorative sleep.   Lab Results  Component Value Date   HGBA1C 6.2 11/02/2020   HGBA1C 5.9 (H) 06/10/2020   HGBA1C 5.5 01/12/2020   Lab Results  Component Value Date   MICROALBUR 0.3 02/06/2017   LDLCALC 71 01/12/2020   CREATININE 1.32 (H) 11/02/2020   - Refill Semaglutide, 2 MG/DOSE, 8 MG/3ML SOPN; Inject 2 mg into the skin once a week.  Dispense: 3 mL; Refill: 3  2. Edema [name] is taking chlorthalidone 25 mg daily.  Will refill this at the same dose today, as per below.  - Refill chlorthalidone (HYGROTON) 25 MG tablet; Take 1 tablet (25 mg total) by mouth daily.  Dispense: 30 tablet; Refill: 2  3. Other male erectile dysfunction Refill Cialis 20 mg daily.  - Refill  tadalafil (CIALIS) 20 MG tablet; Take 1 tablet (20 mg total) by mouth daily as needed.  Dispense: 30 tablet; Refill: 3  4. Obesity, current BMI 36.7  Course: [name] is currently in the action stage of change. As such, his goal is to continue with weight loss efforts.   Nutrition goals: He has agreed to keeping a food journal and adhering to recommended goals of 1500 calories and 100 grams of protein.   Exercise goals:  As is.  Behavioral modification strategies: increasing lean protein intake, decreasing simple carbohydrates, increasing vegetables, and increasing water intake.  [name] has agreed to follow-up with our clinic in 4 weeks. He was informed of the importance of frequent follow-up visits to maximize his success with intensive lifestyle modifications for his multiple health conditions.   Objective:   Blood pressure 119/78, pulse 65, temperature 98 F (36.7 C), temperature source Oral, height 6\' 8"  (2.032 m), weight (!) 334 lb (151.5 kg), SpO2 98 %. Body mass index is 36.69 kg/m.  General: Cooperative, alert, well developed, in no acute distress. HEENT: Conjunctivae and lids unremarkable. Cardiovascular: Regular rhythm.  Lungs: Normal work of breathing. Neurologic: No focal deficits.   Lab Results  Component Value Date   CREATININE 1.32 (H) 11/02/2020   BUN 16 11/02/2020   NA 138 11/02/2020   K 3.8 11/02/2020   CL 100 11/02/2020   CO2 22 11/02/2020   Lab Results  Component Value Date   ALT 29 01/12/2020   AST 22 01/12/2020   ALKPHOS 54 01/12/2020   BILITOT 0.7  01/12/2020   Lab Results  Component Value Date   HGBA1C 6.2 11/02/2020   HGBA1C 5.9 (H) 06/10/2020   HGBA1C 5.5 01/12/2020   HGBA1C 6.7 (H) 07/06/2019   HGBA1C 6.3 (H) 04/07/2019   Lab Results  Component Value Date   TSH 1.260 04/07/2019   Lab Results  Component Value Date   CHOL 125 01/12/2020   HDL 43 01/12/2020   LDLCALC 71 01/12/2020   TRIG 47 01/12/2020   CHOLHDL 2.9 01/12/2020   Lab  Results  Component Value Date   VD25OH 30.5 11/02/2020   VD25OH 21.0 (L) 06/10/2020   VD25OH 23.9 (L) 01/12/2020   Lab Results  Component Value Date   WBC 5.8 01/12/2020   HGB 14.1 01/12/2020   HCT 41.3 01/12/2020   MCV 87 01/12/2020   PLT 261 01/12/2020   Attestation Statements:   Reviewed by clinician on day of visit: allergies, medications, problem list, medical history, surgical history, family history, social history, and previous encounter notes.  I, Insurance claims handler, CMA, am acting as transcriptionist for Helane Rima, DO  I have reviewed the above documentation for accuracy and completeness, and I agree with the above. -  Helane Rima, DO, MS, FAAFP, DABOM - Family and Bariatric Medicine.

## 2020-11-27 ENCOUNTER — Encounter: Payer: Self-pay | Admitting: Cardiovascular Disease

## 2020-11-27 NOTE — Progress Notes (Signed)
Cardiology Office Note   Date:  11/28/2020   ID:  [name], DOB 08/27/1960, MRN [id number]  PCP:  Lyndee Hensen, DO  Cardiologist:   Mertie Moores, MD   Chief Complaint  Patient presents with   Hypertension   Problem list: 1. 1. Malignant hypertension - Echo from the past  shows normal left ventricular  systolic function with EF of 60-65% with moderate LVH 2. Intracranial hemorrhage caused by malignant hypertension 3. Non-ST segment elevation myocardial infarction-in the setting of malignant hypertension,  Echo shows normal LF function with EF of 60-65% with no segmental wall abn.     Brad [name] is a 60 y.o. male who presents for follow up for malignant hypertension and a type 2 non-ST segment myocardial infarction.  He's been doing fairly well from a cardiac point. He's not had any episodes of chest discomfort. He's currently in rehabilitation for intracranial hemorrhage. His blood pressure is much better controlled.  He has lost quite a bit of weight.   Oct. 26, 2016:  No Cp or dyspnea.  BP is fairly well controlled.   May 23, 2015:  Raylee is doing well.  BP is well controlled. Is recovering from his CVA  Walking    OCt. 23, 2017:   No CP or dyspnea.  Echo  [facility] [place], May 06, 2014 - Normal left ventricle systolic function - ejection fraction 60-65%. He has concentric left ventricular hypertrophy.  Works out every day   Jan. 23, 2018:  Feeling well.   Works out every day . Goes to the gym every day  Weight today is 329.  Sept. 13, 2018 Wt = 332 Brad [name] is seen today  BP is well controlled.  No CP or dyspnea  Had stopped the Aldactone,  Is back on it now Is ready to go on a diet - HbA1C is up  Labs from 9/11 look good  October 11, 2017: Has been working out regularly  Had lost some weight but has gained some back   November 11, 2018: Brad [name] is seen today for follow-up visit.  He has a history of hypertension.  Has had an intracranial bleed  and a NSTEMI .  Wants to see a nutritionist.  Just getting back to the gym .  Has been walking   November 24, 2019: Artery seen today for follow-up visit.  His blood pressure is well controlled.  Weight today is 232 pounds. He has a history of a non-ST segment elevation myocardial infarction and also has a history of an intracranial bleed. Is exercising regularly .  Is going to [employer] weight loss program.  Knees are feeling better with weigh loss  Oct. 31, 2022: Brad [name] is seen today for follow up fo his HTN, NSTEMI, obesity Wt is 333 lbs  Still goes to the gym Exercising 6 days a week .   Wants to improve his eating  Has started takeing Mounjaro for weight loss   Past Medical History:  Diagnosis Date   Abnormal ultrasound of carotid artery 04/2014   no significant obstruction   Ataxia 04/2014   Diabetes ([place])    EKG abnormalities 04/2014   poor R wave progression, NSR   Former smoker    25 years x 1.5 ppd, stopped 04/2014   Gait disturbance 04/2014   s/p stroke   H/O echocardiogram 04/2014   moderate LVH, 60-65% EF, no valve disease   History of heart attack    Hypertensive heart disease 04/2014   Intraparenchymal  hemorrhage of brain [facility]) 05/05/2014   hospitalization [facility]   Obesity    Prediabetes    Short-term memory loss 04/2014   Sleep apnea    Stroke [facility]) 04/2014    Past Surgical History:  Procedure Laterality Date   APPENDECTOMY     CHOLECYSTECTOMY     CIRCUMCISION     HEMORRHOID SURGERY       Current Outpatient Medications  Medication Sig Dispense Refill   amLODipine (NORVASC) 10 MG tablet Take 1 tablet (10 mg total) by mouth daily. 90 tablet 3   blood glucose meter kit and supplies KIT Dispense based on patient and insurance preference. Check fasting blood sugar once daily ICD10 R73.03 1 each 0   chlorthalidone (HYGROTON) 25 MG tablet Take 1 tablet (25 mg total) by mouth daily. 30 tablet 2   glucose blood test strip Use as  instructed to monitor FSBS 1x daily. Dx: R73.09 100 each 12   labetalol (NORMODYNE) 200 MG tablet TAKE 1 TABLET(200 MG) BY MOUTH TWICE DAILY 180 tablet 3   Lancets (ONETOUCH ULTRASOFT) lancets Use as instructed 100 each 12   losartan (COZAAR) 100 MG tablet Take 1 tablet (100 mg total) by mouth daily. 90 tablet 3   potassium chloride (KLOR-CON) 10 MEQ tablet Take 1 tablet (10 mEq total) by mouth daily. 90 tablet 3   Semaglutide, 2 MG/DOSE, 8 MG/3ML SOPN Inject 2 mg into the skin once a week. 3 mL 3   tadalafil (CIALIS) 20 MG tablet Take 1 tablet (20 mg total) by mouth daily as needed. 30 tablet 3   tirzepatide (MOUNJARO) 7.5 MG/0.5ML Pen Inject 7.5 mg into the skin once a week.     Vitamin D, Ergocalciferol, (DRISDOL) 1.25 MG (50000 UNIT) CAPS capsule Take 1 capsule (50,000 Units total) by mouth every 7 (seven) days. 8 capsule 0   No current facility-administered medications for this visit.    Allergies:   Patient has no known allergies.    Social History:  The patient  reports that he quit smoking about 6 years ago. His smoking use included cigarettes. He has a 37.50 pack-year smoking history. He has never used smokeless tobacco. He reports that he does not drink alcohol and does not use drugs.   Family History:  The patient's family history includes Aneurysm in his paternal grandmother; Heart disease in his mother; Hypertension in his brother and mother; Sudden death in his mother.    ROS:  Please see the history of present illness.   Physical Exam: Blood pressure 130/80, pulse 69, height $RemoveBe'6\' 9"'vBCGVFlmH$  (2.057 m), weight (!) 333 lb 6.4 oz (151.2 kg), SpO2 98 %.  GEN:  Well nourished, well developed in no acute distress HEENT: Normal NECK: No JVD; No carotid bruits LYMPHATICS: No lymphadenopathy CARDIAC: RRR , no murmurs, rubs, gallops RESPIRATORY:  Clear to auscultation without rales, wheezing or rhonchi  ABDOMEN: Soft, non-tender, non-distended MUSCULOSKELETAL:  No edema; No deformity   SKIN: Warm and dry NEUROLOGIC:  Alert and oriented x 3    EKG:    November 28, 2020: Normal sinus rhythm at 69.  No ST or T wave changes.  Recent Labs: 01/12/2020: ALT 29; Hemoglobin 14.1; Platelets 261 11/02/2020: BUN 16; Creatinine, Ser 1.32; Potassium 3.8; Sodium 138    Lipid Panel    Component Value Date/Time   CHOL 125 01/12/2020 1056   CHOL 84 (L) 10/01/2014 0001   TRIG 47 01/12/2020 1056   TRIG 51 10/01/2014 0001   HDL 43  01/12/2020 1056   HDL 40 10/01/2014 0001   CHOLHDL 2.9 01/12/2020 1056   CHOLHDL 2.8 06/07/2017 0926   VLDL 9 11/21/2015 1124   LDLCALC 71 01/12/2020 1056   LDLCALC 61 06/07/2017 0926   LDLCALC 34 10/01/2014 0001      Wt Readings from Last 3 Encounters:  11/28/20 (!) 333 lb 6.4 oz (151.2 kg)  11/22/20 (!) 334 lb (151.5 kg)  11/02/20 (!) 334 lb 8 oz (151.7 kg)      Other studies Reviewed: Additional studies/ records that were reviewed today include: . Review of the above records demonstrates:    ASSESSMENT AND PLAN:  1.  Malignant hypertension -overall his blood pressure is very well controlled.  His potassium level was on the lower end of normal.  We will add potassium chloride 10 mill equivalents a day.  We will recheck a basic metabolic profile in 2 to 3 weeks.  2. Intracranial hemorrhage   -  3. Non-ST segment elevation myocardial infarction- He is not having any angina.  He works out on a regular basis.  We will check lipids and ALT at the time of his blood draw in 2 to 3 weeks.  Continue current medications.    Current medicines are reviewed at length with the patient today.  The patient does not have concerns regarding medicines.  The following changes have been made:  no change  Labs/ tests ordered today include:   Orders Placed This Encounter  Procedures   Basic metabolic panel   Lipid panel   ALT   EKG 12-Lead     Disposition:   FU with me in 1 year    Mertie Moores, MD  11/28/2020 5:32 PM    [facility] [address], [place], [place]  [address] Phone: [phone]; Fax: [phone]

## 2020-11-28 ENCOUNTER — Ambulatory Visit: Payer: Medicare Other | Admitting: Cardiovascular Disease

## 2020-11-28 ENCOUNTER — Other Ambulatory Visit: Payer: Self-pay

## 2020-11-28 ENCOUNTER — Encounter: Payer: Self-pay | Admitting: Cardiovascular Disease

## 2020-11-28 VITALS — BP 130/80 | HR 69 | Ht >= 80 in | Wt 333.4 lb

## 2020-11-28 DIAGNOSIS — I25119 Atherosclerotic heart disease of native coronary artery with unspecified angina pectoris: Secondary | ICD-10-CM

## 2020-11-28 DIAGNOSIS — I10 Essential (primary) hypertension: Secondary | ICD-10-CM | POA: Diagnosis not present

## 2020-11-28 MED ORDER — POTASSIUM CHLORIDE ER 10 MEQ PO TBCR: 10.0000 meq | EXTENDED_RELEASE_TABLET | Freq: Every day | ORAL | 3 refills | Status: DC

## 2020-11-28 NOTE — Patient Instructions (Signed)
Medication Instructions:  Your physician has recommended you make the following change in your medication:  START K-DUR 10 MEQ DAILY    *If you need a refill on your cardiac medications before your next appointment, please call your pharmacy*   Lab Work: TO BE DONE IN 2 WEEKS: BMP, ALT, LIPIDS If you have labs (blood work) drawn today and your tests are completely normal, you will receive your results only by: MyChart Message (if you have MyChart) OR A paper copy in the mail If you have any lab test that is abnormal or we need to change your treatment, we will call you to review the results.   Testing/Procedures: NONE   Follow-Up: At [facility], you and your health needs are our priority.  As part of our continuing mission to provide you with exceptional heart care, we have created designated Provider Care Teams.  These Care Teams include your primary Cardiologist (physician) and Advanced Practice Providers (APPs -  Physician Assistants and Nurse Practitioners) who all work together to provide you with the care you need, when you need it.  We recommend signing up for the patient portal called "MyChart".  Sign up information is provided on this After Visit Summary.  MyChart is used to connect with patients for Virtual Visits (Telemedicine).  Patients are able to view lab/test results, encounter notes, upcoming appointments, etc.  Non-urgent messages can be sent to your provider as well.   To learn more about what you can do with MyChart, go to [email or web address].    Your next appointment:   1 year(s)  The format for your next appointment:   In Person  Provider:   You may see DR. [name] or one of the following Advanced Practice Providers on your designated Care Team:   Tereso Newcomer, PA-C [place], [place]

## 2020-12-09 ENCOUNTER — Other Ambulatory Visit: Payer: Self-pay | Admitting: Family Medicine

## 2020-12-09 DIAGNOSIS — N528 Other male erectile dysfunction: Secondary | ICD-10-CM

## 2020-12-12 ENCOUNTER — Other Ambulatory Visit: Payer: Self-pay

## 2020-12-12 ENCOUNTER — Other Ambulatory Visit: Payer: Medicare Other

## 2020-12-12 DIAGNOSIS — I25119 Atherosclerotic heart disease of native coronary artery with unspecified angina pectoris: Secondary | ICD-10-CM

## 2020-12-12 DIAGNOSIS — I10 Essential (primary) hypertension: Secondary | ICD-10-CM

## 2020-12-12 LAB — BASIC METABOLIC PANEL
BUN/Creatinine Ratio: 15 (ref 9–20)
BUN: 20 mg/dL (ref 6–24)
CO2: 25 mmol/L (ref 20–29)
Calcium: 9.7 mg/dL (ref 8.7–10.2)
Chloride: 103 mmol/L (ref 96–106)
Creatinine, Ser: 1.32 mg/dL — ABNORMAL HIGH (ref 0.76–1.27)
Glucose: 105 mg/dL — ABNORMAL HIGH (ref 70–99)
Potassium: 4.2 mmol/L (ref 3.5–5.2)
Sodium: 141 mmol/L (ref 134–144)
eGFR: 63 mL/min/{1.73_m2} (ref >59)

## 2020-12-12 LAB — LIPID PANEL
Chol/HDL Ratio: 2.9 ratio (ref 0.0–5.0)
Cholesterol, Total: 115 mg/dL (ref 100–199)
HDL: 39 mg/dL — ABNORMAL LOW (ref >39)
LDL Chol Calc (NIH): 64 mg/dL (ref 0–99)
Triglycerides: 49 mg/dL (ref 0–149)
VLDL Cholesterol Cal: 12 mg/dL (ref 5–40)

## 2020-12-12 LAB — ALT: ALT: 32 IU/L (ref 0–44)

## 2020-12-14 ENCOUNTER — Encounter (INDEPENDENT_AMBULATORY_CARE_PROVIDER_SITE_OTHER): Payer: Self-pay | Admitting: Family Medicine

## 2020-12-14 ENCOUNTER — Other Ambulatory Visit: Payer: Self-pay

## 2020-12-14 ENCOUNTER — Ambulatory Visit (INDEPENDENT_AMBULATORY_CARE_PROVIDER_SITE_OTHER): Payer: Medicare Other | Admitting: Family Medicine

## 2020-12-14 VITALS — BP 97/63 | Temp 98.1°F | Ht >= 80 in | Wt 327.0 lb

## 2020-12-14 DIAGNOSIS — I152 Hypertension secondary to endocrine disorders: Secondary | ICD-10-CM

## 2020-12-14 DIAGNOSIS — E1159 Type 2 diabetes mellitus with other circulatory complications: Secondary | ICD-10-CM

## 2020-12-14 DIAGNOSIS — E1169 Type 2 diabetes mellitus with other specified complication: Secondary | ICD-10-CM | POA: Diagnosis not present

## 2020-12-14 DIAGNOSIS — Z6837 Body mass index (BMI) 37.0-37.9, adult: Secondary | ICD-10-CM

## 2020-12-15 MED ORDER — AMLODIPINE BESYLATE 10 MG PO TABS: 10.0000 mg | ORAL_TABLET | Freq: Every day | ORAL | 3 refills | Status: DC

## 2020-12-15 MED ORDER — SEMAGLUTIDE (2 MG/DOSE) 8 MG/3ML ~~LOC~~ SOPN: 2.0000 mg | PEN_INJECTOR | SUBCUTANEOUS | 3 refills | Status: DC

## 2020-12-15 MED ORDER — LOSARTAN POTASSIUM 100 MG PO TABS: 100.0000 mg | ORAL_TABLET | Freq: Every day | ORAL | 3 refills | Status: DC

## 2020-12-15 NOTE — Progress Notes (Signed)
Chief Complaint:   OBESITY Brad [name] is here to discuss his progress with his obesity treatment plan along with follow-up of his obesity related diagnoses. See Medical Weight Management Flowsheet for complete bioelectrical impedance results.  Today's visit was #: 21 Starting weight: 334 lbs Starting date: 04/07/2019 Weight change since last visit: 7 lbs Total lbs lost to date: 7 lbs Total weight loss percentage to date: -2.10%  Nutrition Plan: Keeping a food journal and adhering to recommended goals of 1500 calories and 100 grams of protein daily for 50% of the time. Activity: Cardio for 60-90 minutes 5-6 times per week.  Anti-obesity medications: Ozempic 2 mg subcutaneously weekly. Reported side effects: None.  Interim History: Tyton says he increased his water intake and is doing limited snacking.  He is also doing more exercise.   His blood pressure is low today.  Denies dizziness or lightheadedness. He says that [facility] costs $75 per month. He saw Cardiology recently.  Note reviewed.  Assessment/Plan:   1. Hypertension associated with type 2 diabetes mellitus (HCC) Low today. Medications: Norvac 10 mg daily, Cozaar 100 mg daily, chlorthalidone 25 mg daily, labetalol 200 mg daily.   Plan:  Continue medications.  Will refill Norvasc and Cozaar today. Avoid buying foods that are: processed, frozen, or prepackaged to avoid excess salt. We will watch for signs of hypotension as he continues lifestyle modifications.  BP Readings from Last 3 Encounters:  12/14/20 97/63  11/28/20 130/80  11/22/20 119/78   Lab Results  Component Value Date   CREATININE 1.32 (H) 12/12/2020   - Refill amLODipine (NORVASC) 10 MG tablet; Take 1 tablet (10 mg total) by mouth daily.  Dispense: 90 tablet; Refill: 3 - Refill losartan (COZAAR) 100 MG tablet; Take 1 tablet (100 mg total) by mouth daily.  Dispense: 90 tablet; Refill: 3  2. Type 2 diabetes mellitus with other specified complication,  without long-term current use of insulin (HCC) Diabetes Mellitus: Improving, but not optimized. Medication: Ozempic 2 mg subcutaneously weekly. Issues reviewed: blood sugar goals, complications of diabetes mellitus, hypoglycemia prevention and treatment, exercise, and nutrition.  Plan:  Continue Ozempic 2 mg subcutaneously weekly.  Will refill today.  The patient will continue to focus on protein-rich, low simple carbohydrate foods. We reviewed the importance of hydration, regular exercise for stress reduction, and restorative sleep.   Lab Results  Component Value Date   HGBA1C 6.2 11/02/2020   HGBA1C 5.9 (H) 06/10/2020   HGBA1C 5.5 01/12/2020   Lab Results  Component Value Date   MICROALBUR 0.3 02/06/2017   LDLCALC 64 12/12/2020   CREATININE 1.32 (H) 12/12/2020   - Refill Semaglutide, 2 MG/DOSE, 8 MG/3ML SOPN; Inject 2 mg into the skin once a week.  Dispense: 3 mL; Refill: 3  3. Obesity BMI today is 36  Course: Farmer is currently in the action stage of change. As such, his goal is to continue with weight loss efforts.   Nutrition goals: He has agreed to keeping a food journal and adhering to recommended goals of 1500 calories and 100 grams of protein.   Exercise goals:  As is.  Behavioral modification strategies: increasing lean protein intake, decreasing simple carbohydrates, increasing vegetables, and increasing water intake.  Brevon has agreed to follow-up with our clinic in 4 weeks. He was informed of the importance of frequent follow-up visits to maximize his success with intensive lifestyle modifications for his multiple health conditions.   Objective:   Blood pressure 97/63, temperature 98.1 F (36.7 C),  temperature source Oral, height 6\' 8"  (2.032 m), weight (!) 327 lb (148.3 kg), SpO2 96 %. Body mass index is 35.92 kg/m.  General: Cooperative, alert, well developed, in no acute distress. HEENT: Conjunctivae and lids unremarkable. Cardiovascular: Regular rhythm.   Lungs: Normal work of breathing. Neurologic: No focal deficits.   Lab Results  Component Value Date   CREATININE 1.32 (H) 12/12/2020   BUN 20 12/12/2020   NA 141 12/12/2020   K 4.2 12/12/2020   CL 103 12/12/2020   CO2 25 12/12/2020   Lab Results  Component Value Date   ALT 32 12/12/2020   AST 22 01/12/2020   ALKPHOS 54 01/12/2020   BILITOT 0.7 01/12/2020   Lab Results  Component Value Date   HGBA1C 6.2 11/02/2020   HGBA1C 5.9 (H) 06/10/2020   HGBA1C 5.5 01/12/2020   HGBA1C 6.7 (H) 07/06/2019   HGBA1C 6.3 (H) 04/07/2019   Lab Results  Component Value Date   TSH 1.260 04/07/2019   Lab Results  Component Value Date   CHOL 115 12/12/2020   HDL 39 (L) 12/12/2020   LDLCALC 64 12/12/2020   TRIG 49 12/12/2020   CHOLHDL 2.9 12/12/2020   Lab Results  Component Value Date   VD25OH 30.5 11/02/2020   VD25OH 21.0 (L) 06/10/2020   VD25OH 23.9 (L) 01/12/2020   Lab Results  Component Value Date   WBC 5.8 01/12/2020   HGB 14.1 01/12/2020   HCT 41.3 01/12/2020   MCV 87 01/12/2020   PLT 261 01/12/2020   Attestation Statements:   Reviewed by clinician on day of visit: allergies, medications, problem list, medical history, surgical history, family history, social history, and previous encounter notes.  Time spent on visit including pre-visit chart review and post-visit care and documentation was 34 minutes. Time was spent on: Food choices and timing of food intake reviewed today. I discussed a personalized meal plan with the patient that will help him to lose weight and will improve his obesity-related conditions going forward. I performed a medically necessary appropriate examination and/or evaluation. I discussed the assessment and treatment plan with the patient. Motivational interviewing as well as evidence-based interventions for health behavior change were utilized today including the discussion of self monitoring techniques, problem-solving barriers and SMART goal setting  techniques.  An exercise prescription was reviewed.  The patient was provided an opportunity to ask questions and all were answered. The patient agreed with the plan and demonstrated an understanding of the instructions. Clinical information was updated and documented in the EMR.  I, 01/14/2020, CMA, am acting as transcriptionist for Insurance claims handler, DO  I have reviewed the above documentation for accuracy and completeness, and I agree with the above. -  Helane Rima, DO, MS, FAAFP, DABOM - Family and Bariatric Medicine.

## 2021-01-10 ENCOUNTER — Encounter (INDEPENDENT_AMBULATORY_CARE_PROVIDER_SITE_OTHER): Payer: Self-pay | Admitting: Family Medicine

## 2021-01-10 ENCOUNTER — Other Ambulatory Visit: Payer: Self-pay

## 2021-01-10 ENCOUNTER — Ambulatory Visit (INDEPENDENT_AMBULATORY_CARE_PROVIDER_SITE_OTHER): Payer: Medicare Other | Admitting: Family Medicine

## 2021-01-10 VITALS — BP 114/73 | HR 64 | Temp 97.6°F | Ht >= 80 in | Wt 324.0 lb

## 2021-01-10 DIAGNOSIS — E1169 Type 2 diabetes mellitus with other specified complication: Secondary | ICD-10-CM | POA: Diagnosis not present

## 2021-01-10 DIAGNOSIS — Z6837 Body mass index (BMI) 37.0-37.9, adult: Secondary | ICD-10-CM

## 2021-01-10 DIAGNOSIS — R609 Edema, unspecified: Secondary | ICD-10-CM

## 2021-01-10 DIAGNOSIS — E559 Vitamin D deficiency, unspecified: Secondary | ICD-10-CM | POA: Diagnosis not present

## 2021-01-10 DIAGNOSIS — I152 Hypertension secondary to endocrine disorders: Secondary | ICD-10-CM

## 2021-01-10 DIAGNOSIS — E1159 Type 2 diabetes mellitus with other circulatory complications: Secondary | ICD-10-CM | POA: Diagnosis not present

## 2021-01-11 MED ORDER — SEMAGLUTIDE (2 MG/DOSE) 8 MG/3ML ~~LOC~~ SOPN: 2.0000 mg | PEN_INJECTOR | SUBCUTANEOUS | 3 refills | Status: AC

## 2021-01-11 MED ORDER — CHLORTHALIDONE 25 MG PO TABS: 25.0000 mg | ORAL_TABLET | Freq: Every day | ORAL | 2 refills | Status: DC

## 2021-01-11 NOTE — Progress Notes (Signed)
Chief Complaint:   OBESITY Brad [name] is here to discuss his progress with his obesity treatment plan along with follow-up of his obesity related diagnoses. See Medical Weight Management Flowsheet for complete bioelectrical impedance results.  Today's visit was #: 22 Starting weight: 334 lbs Starting date: 04/07/2019 Weight change since last visit: 3 lbs Total lbs lost to date: 10 lbs Total weight loss percentage to date: -2.99%  Nutrition Plan: Keeping a food journal and adhering to recommended goals of 1500 calories and 100 grams of protein daily for 60% of the time. Activity: Cardio for 60-90 minutes 5-6 times per week. Anti-obesity medications: Ozempic 2 mg subcutaneously weekly. Reported side effects: None.  Interim History: Brad [name] says he is happy with his weight loss.  He is feeling better.  Assessment/Plan:   1. Type 2 diabetes mellitus with other specified complication, without long-term current use of insulin (HCC) Diabetes Mellitus: Not at goal. Medication: Ozempic 2 mg subcutaneously weekly. Issues reviewed: blood sugar goals, complications of diabetes mellitus, hypoglycemia prevention and treatment, exercise, and nutrition.  Plan:  See if Ozempic 2 mg is back in stock.  The patient will continue to focus on protein-rich, low simple carbohydrate foods. We reviewed the importance of hydration, regular exercise for stress reduction, and restorative sleep.   Lab Results  Component Value Date   HGBA1C 6.2 11/02/2020   HGBA1C 5.9 (H) 06/10/2020   HGBA1C 5.5 01/12/2020   Lab Results  Component Value Date   MICROALBUR 0.3 02/06/2017   LDLCALC 64 12/12/2020   CREATININE 1.32 (H) 12/12/2020   - Refill Semaglutide, 2 MG/DOSE, 8 MG/3ML SOPN; Inject 2 mg into the skin once a week.  Dispense: 3 mL; Refill: 3  2. Hypertension associated with type 2 diabetes mellitus (HCC) At goal. Medications: Norvasc 10 mg daily, chlorthalidone 25 mg daily, labetalol 200 m daily, losartan 100  mg daily.   Plan: Avoid buying foods that are: processed, frozen, or prepackaged to avoid excess salt. We will watch for signs of hypotension as he continues lifestyle modifications.  BP Readings from Last 3 Encounters:  01/10/21 114/73  12/14/20 97/63  11/28/20 130/80   Lab Results  Component Value Date   CREATININE 1.32 (H) 12/12/2020   3. Vitamin D deficiency Not at goal.  He is taking vitamin D 50,000 IU weekly.  Plan: Continue to take prescription Vitamin D @50 ,000 IU every week as prescribed.  Follow-up for routine testing of Vitamin D, at least 2-3 times per year to avoid over-replacement.  Lab Results  Component Value Date   VD25OH 30.5 11/02/2020   VD25OH 21.0 (L) 06/10/2020   VD25OH 23.9 (L) 01/12/2020   4. Edema, unspecified type Brad [name] is taking chlorthalidone 25 mg daily.  Plan:  Continue chlorthalidone at 25 mg daily.  Will refill today, as per below.  - Refill chlorthalidone (HYGROTON) 25 MG tablet; Take 1 tablet (25 mg total) by mouth daily.  Dispense: 30 tablet; Refill: 2  5. Obesity BMI today is 35.7  Course: Brad [name] is currently in the action stage of change. As such, his goal is to continue with weight loss efforts.   Nutrition goals: He has agreed to keeping a food journal and adhering to recommended goals of 1500 calories and 100 grams of protein.   Exercise goals:  As is.  Behavioral modification strategies: increasing lean protein intake, decreasing simple carbohydrates, increasing vegetables, increasing water intake, and decreasing liquid calories.  Brad [name] has agreed to follow-up with our clinic in 4 weeks.  He was informed of the importance of frequent follow-up visits to maximize his success with intensive lifestyle modifications for his multiple health conditions.   Objective:   Blood pressure 114/73, pulse 64, temperature 97.6 F (36.4 C), temperature source Oral, height 6\' 8"  (2.032 m), weight (!) 324 lb (147 kg), SpO2 98 %. Body mass index  is 35.59 kg/m.  General: Cooperative, alert, well developed, in no acute distress. HEENT: Conjunctivae and lids unremarkable. Cardiovascular: Regular rhythm.  Lungs: Normal work of breathing. Neurologic: No focal deficits.   Lab Results  Component Value Date   CREATININE 1.32 (H) 12/12/2020   BUN 20 12/12/2020   NA 141 12/12/2020   K 4.2 12/12/2020   CL 103 12/12/2020   CO2 25 12/12/2020   Lab Results  Component Value Date   ALT 32 12/12/2020   AST 22 01/12/2020   ALKPHOS 54 01/12/2020   BILITOT 0.7 01/12/2020   Lab Results  Component Value Date   HGBA1C 6.2 11/02/2020   HGBA1C 5.9 (H) 06/10/2020   HGBA1C 5.5 01/12/2020   HGBA1C 6.7 (H) 07/06/2019   HGBA1C 6.3 (H) 04/07/2019   Lab Results  Component Value Date   TSH 1.260 04/07/2019   Lab Results  Component Value Date   CHOL 115 12/12/2020   HDL 39 (L) 12/12/2020   LDLCALC 64 12/12/2020   TRIG 49 12/12/2020   CHOLHDL 2.9 12/12/2020   Lab Results  Component Value Date   VD25OH 30.5 11/02/2020   VD25OH 21.0 (L) 06/10/2020   VD25OH 23.9 (L) 01/12/2020   Lab Results  Component Value Date   WBC 5.8 01/12/2020   HGB 14.1 01/12/2020   HCT 41.3 01/12/2020   MCV 87 01/12/2020   PLT 261 01/12/2020   Attestation Statements:   Reviewed by clinician on day of visit: allergies, medications, problem list, medical history, surgical history, family history, social history, and previous encounter notes.  I, 01/14/2020, CMA, am acting as transcriptionist for Insurance claims handler, DO  I have reviewed the above documentation for accuracy and completeness, and I agree with the above. -  Helane Rima, DO, MS, FAAFP, DABOM - Family and Bariatric Medicine.

## 2021-01-31 ENCOUNTER — Ambulatory Visit (INDEPENDENT_AMBULATORY_CARE_PROVIDER_SITE_OTHER): Payer: Medicare Other | Admitting: Family Medicine

## 2021-02-01 ENCOUNTER — Encounter (INDEPENDENT_AMBULATORY_CARE_PROVIDER_SITE_OTHER): Payer: Self-pay

## 2021-03-01 ENCOUNTER — Other Ambulatory Visit: Payer: Self-pay

## 2021-03-01 ENCOUNTER — Encounter (INDEPENDENT_AMBULATORY_CARE_PROVIDER_SITE_OTHER): Payer: Self-pay | Admitting: Family Medicine

## 2021-03-01 ENCOUNTER — Ambulatory Visit (INDEPENDENT_AMBULATORY_CARE_PROVIDER_SITE_OTHER): Payer: Medicare Other | Admitting: Family Medicine

## 2021-03-01 VITALS — BP 112/72 | HR 68 | Temp 97.9°F | Ht >= 80 in | Wt 328.0 lb

## 2021-03-01 DIAGNOSIS — E1169 Type 2 diabetes mellitus with other specified complication: Secondary | ICD-10-CM | POA: Diagnosis not present

## 2021-03-01 DIAGNOSIS — E1159 Type 2 diabetes mellitus with other circulatory complications: Secondary | ICD-10-CM | POA: Diagnosis not present

## 2021-03-01 DIAGNOSIS — N521 Erectile dysfunction due to diseases classified elsewhere: Secondary | ICD-10-CM

## 2021-03-01 DIAGNOSIS — E669 Obesity, unspecified: Secondary | ICD-10-CM

## 2021-03-01 DIAGNOSIS — Z6836 Body mass index (BMI) 36.0-36.9, adult: Secondary | ICD-10-CM

## 2021-03-01 DIAGNOSIS — I152 Hypertension secondary to endocrine disorders: Secondary | ICD-10-CM | POA: Diagnosis not present

## 2021-03-01 DIAGNOSIS — Z7985 Long-term (current) use of injectable non-insulin antidiabetic drugs: Secondary | ICD-10-CM

## 2021-03-01 MED ORDER — OZEMPIC (2 MG/DOSE) 8 MG/3ML ~~LOC~~ SOPN: 2.0000 mg | PEN_INJECTOR | SUBCUTANEOUS | 3 refills | Status: DC

## 2021-03-01 MED ORDER — TADALAFIL 20 MG PO TABS: ORAL_TABLET | ORAL | 3 refills | Status: DC

## 2021-03-02 NOTE — Progress Notes (Signed)
Chief Complaint:   OBESITY Brad [name] is here to discuss his progress with his obesity treatment plan along with follow-up of his obesity related diagnoses. See Medical Weight Management Flowsheet for complete bioelectrical impedance results.  Today's visit was #: 23 Starting weight: 334 lbs Starting date: 04/07/2019 Weight change since last visit: +4 lbs Total lbs lost to date: 6 lbs Total weight loss percentage to date: -1.82%  Nutrition Plan: Keeping a food journal and adhering to recommended goals of 1500 calories and 100 grams of protein daily for 70% of the time. Activity: Cardio for 90-120 minutes 5 times per week.   Interim History: Brad [name] says his friend died of an MI last week. This has shaken him.  Assessment/Plan:   1. Type 2 diabetes mellitus with other specified complication, without long-term current use of insulin (HCC) Diabetes Mellitus: Not at goal. Medication: Ozempic 2 mg subcutaneously weekly. Issues reviewed: blood sugar goals, complications of diabetes mellitus, hypoglycemia prevention and treatment, exercise, and nutrition.  Plan: The patient will continue to focus on protein-rich, low simple carbohydrate foods. We reviewed the importance of hydration, regular exercise for stress reduction, and restorative sleep.   Lab Results  Component Value Date   HGBA1C 6.2 11/02/2020   HGBA1C 5.9 (H) 06/10/2020   HGBA1C 5.5 01/12/2020   Lab Results  Component Value Date   MICROALBUR 0.3 02/06/2017   LDLCALC 64 12/12/2020   CREATININE 1.32 (H) 12/12/2020   - Refill OZEMPIC, 2 MG/DOSE, 8 MG/3ML SOPN; Inject 2 mg into the skin once a week.  Dispense: 9 mL; Refill: 3  2. Hypertension associated with type 2 diabetes mellitus (HCC) At goal. Medications: Norvasc 10 mg daily, chlorthalidone 25 mg daily, Cozaar 100 mg daily.   Plan: Avoid buying foods that are: processed, frozen, or prepackaged to avoid excess salt. We will watch for signs of hypotension as he continues  lifestyle modifications.  BP Readings from Last 3 Encounters:  03/01/21 112/72  01/10/21 114/73  12/14/20 97/63   Lab Results  Component Value Date   CREATININE 1.32 (H) 12/12/2020   3. Erectile dysfunction associated with type 2 diabetes mellitus (HCC) Taking Cialis 20 mg daily as needed.  Will refill today.  - Refill tadalafil (CIALIS) 20 MG tablet; TAKE 1 TABLET(20 MG) BY MOUTH DAILY AS NEEDED  Dispense: 30 tablet; Refill: 3  4. Obesity BMI today is 36.1  Course: [name] is currently in the action stage of change. As such, his goal is to continue with weight loss efforts.   Nutrition goals: He has agreed to keeping a food journal and adhering to recommended goals of 1500 calories and 100 grams of protein.   Exercise goals:  As is.  Behavioral modification strategies: increasing lean protein intake, decreasing simple carbohydrates, increasing vegetables, increasing water intake, and decreasing liquid calories.  Brad [name] has agreed to follow-up with our clinic in 4 weeks. He was informed of the importance of frequent follow-up visits to maximize his success with intensive lifestyle modifications for his multiple health conditions.   Objective:   Blood pressure 112/72, pulse 68, temperature 97.9 F (36.6 C), temperature source Oral, height 6\' 8"  (2.032 m), weight (!) 328 lb (148.8 kg), SpO2 97 %. Body mass index is 36.03 kg/m.  General: Cooperative, alert, well developed, in no acute distress. HEENT: Conjunctivae and lids unremarkable. Cardiovascular: Regular rhythm.  Lungs: Normal work of breathing. Neurologic: No focal deficits.   Lab Results  Component Value Date   CREATININE 1.32 (H) 12/12/2020  BUN 20 12/12/2020   NA 141 12/12/2020   K 4.2 12/12/2020   CL 103 12/12/2020   CO2 25 12/12/2020   Lab Results  Component Value Date   ALT 32 12/12/2020   AST 22 01/12/2020   ALKPHOS 54 01/12/2020   BILITOT 0.7 01/12/2020   Lab Results  Component Value Date    HGBA1C 6.2 11/02/2020   HGBA1C 5.9 (H) 06/10/2020   HGBA1C 5.5 01/12/2020   HGBA1C 6.7 (H) 07/06/2019   HGBA1C 6.3 (H) 04/07/2019   Lab Results  Component Value Date   TSH 1.260 04/07/2019   Lab Results  Component Value Date   CHOL 115 12/12/2020   HDL 39 (L) 12/12/2020   LDLCALC 64 12/12/2020   TRIG 49 12/12/2020   CHOLHDL 2.9 12/12/2020   Lab Results  Component Value Date   VD25OH 30.5 11/02/2020   VD25OH 21.0 (L) 06/10/2020   VD25OH 23.9 (L) 01/12/2020   Lab Results  Component Value Date   WBC 5.8 01/12/2020   HGB 14.1 01/12/2020   HCT 41.3 01/12/2020   MCV 87 01/12/2020   PLT 261 01/12/2020   Attestation Statements:   Reviewed by clinician on day of visit: allergies, medications, problem list, medical history, surgical history, family history, social history, and previous encounter notes.  I, Insurance claims handler, CMA, am acting as transcriptionist for Helane Rima, DO  I have reviewed the above documentation for accuracy and completeness, and I agree with the above. -  Helane Rima, DO, MS, FAAFP, DABOM - Family and Bariatric Medicine.

## 2021-03-29 ENCOUNTER — Ambulatory Visit (INDEPENDENT_AMBULATORY_CARE_PROVIDER_SITE_OTHER): Payer: Medicare Other | Admitting: Family Medicine

## 2021-04-11 ENCOUNTER — Ambulatory Visit (INDEPENDENT_AMBULATORY_CARE_PROVIDER_SITE_OTHER): Payer: Medicare Other | Admitting: Family Medicine

## 2021-04-17 ENCOUNTER — Other Ambulatory Visit: Payer: Self-pay

## 2021-04-17 ENCOUNTER — Encounter (INDEPENDENT_AMBULATORY_CARE_PROVIDER_SITE_OTHER): Payer: Self-pay | Admitting: Family Medicine

## 2021-04-17 ENCOUNTER — Ambulatory Visit (INDEPENDENT_AMBULATORY_CARE_PROVIDER_SITE_OTHER): Payer: Medicare Other | Admitting: Family Medicine

## 2021-04-17 VITALS — BP 118/73 | HR 66 | Temp 97.8°F | Ht >= 80 in | Wt 332.0 lb

## 2021-04-17 DIAGNOSIS — E1169 Type 2 diabetes mellitus with other specified complication: Secondary | ICD-10-CM | POA: Diagnosis not present

## 2021-04-17 DIAGNOSIS — I152 Hypertension secondary to endocrine disorders: Secondary | ICD-10-CM | POA: Diagnosis not present

## 2021-04-17 DIAGNOSIS — E1159 Type 2 diabetes mellitus with other circulatory complications: Secondary | ICD-10-CM | POA: Diagnosis not present

## 2021-04-17 DIAGNOSIS — Z7985 Long-term (current) use of injectable non-insulin antidiabetic drugs: Secondary | ICD-10-CM

## 2021-04-17 DIAGNOSIS — Z6836 Body mass index (BMI) 36.0-36.9, adult: Secondary | ICD-10-CM

## 2021-04-17 DIAGNOSIS — Z9989 Dependence on other enabling machines and devices: Secondary | ICD-10-CM

## 2021-04-17 DIAGNOSIS — G4733 Obstructive sleep apnea (adult) (pediatric): Secondary | ICD-10-CM

## 2021-04-17 DIAGNOSIS — E669 Obesity, unspecified: Secondary | ICD-10-CM

## 2021-04-17 DIAGNOSIS — R609 Edema, unspecified: Secondary | ICD-10-CM

## 2021-04-18 MED ORDER — LABETALOL HCL 200 MG PO TABS: ORAL_TABLET | ORAL | 3 refills | Status: DC

## 2021-04-18 MED ORDER — CHLORTHALIDONE 25 MG PO TABS: 25.0000 mg | ORAL_TABLET | Freq: Every day | ORAL | 2 refills | Status: DC

## 2021-04-26 NOTE — Progress Notes (Signed)
Chief Complaint:   OBESITY Brad [name] is here to discuss his progress with his obesity treatment plan along with follow-up of his obesity related diagnoses. See Medical Weight Management Flowsheet for complete bioelectrical impedance results.  Today's visit was #: 24 Starting weight: 334 lbs Starting date: 04/07/2019 Weight change since last visit: +4 lbs Total lbs lost to date: 2 lbs Total weight loss percentage to date: -0.60%  Nutrition Plan: Keeping a food journal and adhering to recommended goals of 1500 calories and 100 grams of protein daily for 60% of the time. Activity: Cardio for 60-90 minutes 6 times per week. Anti-obesity medications: Ozempic 2 mg subcutaneously weekly. Reported side effects: None.  Interim History: Brad [name] says he is unhappy with his weight gain.  He plans to get back on track.  Assessment/Plan:   1. Type 2 diabetes mellitus with other specified complication, without long-term current use of insulin (HCC) Diabetes Mellitus: Not at goal. Medication: Ozempic 2 mg subcutaneously daily. Issues reviewed: blood sugar goals, complications of diabetes mellitus, hypoglycemia prevention and treatment, exercise, and nutrition.  Plan: Continue Ozempic 2 mg subcutaneously weekly.  The patient will continue to focus on protein-rich, low simple carbohydrate foods. We reviewed the importance of hydration, regular exercise for stress reduction, and restorative sleep.   Lab Results  Component Value Date   HGBA1C 6.2 11/02/2020   HGBA1C 5.9 (H) 06/10/2020   HGBA1C 5.5 01/12/2020   Lab Results  Component Value Date   MICROALBUR 0.3 02/06/2017   LDLCALC 64 12/12/2020   CREATININE 1.32 (H) 12/12/2020   2. OSA on CPAP OSA is a cause of systemic hypertension and is associated with an increased incidence of stroke, heart failure, atrial fibrillation, and coronary heart disease. Severe OSA increases all-cause mortality and cardiovascular mortality.   Goal: Treatment of  OSA via CPAP compliance and weight loss. Plasma ghrelin levels (appetite or "hunger hormone") are significantly higher in OSA patients than in BMI-matched controls, but decrease to levels similar to those of obese patients without OSA after CPAP treatment.  Weight loss improves OSA by several mechanisms, including reduction in fatty tissue in the throat (i.e. parapharyngeal fat) and the tongue. Loss of abdominal fat increases mediastinal traction on the upper airway making it less likely to collapse during sleep. Studies have also shown that compliance with CPAP treatment improves leptin (hunger inhibitory hormone) imbalance.  3. Edema, unspecified type Brad [name] is taking chlorthalidone 25 mg daily for edema.  Plan:  Continue chlorthalidone.  Will refill today, as per below.  - Refill chlorthalidone (HYGROTON) 25 MG tablet; Take 1 tablet (25 mg total) by mouth daily.  Dispense: 30 tablet; Refill: 2  4. Hypertension associated with type 2 diabetes mellitus (HCC) At goal. Medications: Norvasc 10 mg daily, chlorthalidone 25 mg daily, labetalol 200 mg twice daily, Cozaar 100 mg daily.   Plan:  Continue all medications as prescribed.  Will provide refill for labetalol today, as per below. Avoid buying foods that are: processed, frozen, or prepackaged to avoid excess salt. We will watch for signs of hypotension as he continues lifestyle modifications.  BP Readings from Last 3 Encounters:  04/17/21 118/73  03/01/21 112/72  01/10/21 114/73   Lab Results  Component Value Date   CREATININE 1.32 (H) 12/12/2020   - Refill labetalol (NORMODYNE) 200 MG tablet; TAKE 1 TABLET(200 MG) BY MOUTH TWICE DAILY  Dispense: 180 tablet; Refill: 3  5. Obesity BMI today is 36.5  Course: Brad [name] is currently in the action stage of  change. As such, his goal is to continue with weight loss efforts.   Nutrition goals: He has agreed to keeping a food journal and adhering to recommended goals of 1500 calories and 100  grams of protein.   Exercise goals:  As is.  Behavioral modification strategies: better snacking choices and emotional eating strategies.  Brad [name] has agreed to follow-up with our clinic in 4 weeks. He was informed of the importance of frequent follow-up visits to maximize his success with intensive lifestyle modifications for his multiple health conditions.   Objective:   Blood pressure 118/73, pulse 66, temperature 97.8 F (36.6 C), temperature source Oral, height 6\' 8"  (2.032 m), weight (!) 332 lb (150.6 kg), SpO2 98 %. Body mass index is 36.47 kg/m.  General: Cooperative, alert, well developed, in no acute distress. HEENT: Conjunctivae and lids unremarkable. Cardiovascular: Regular rhythm.  Lungs: Normal work of breathing. Neurologic: No focal deficits.   Lab Results  Component Value Date   CREATININE 1.32 (H) 12/12/2020   BUN 20 12/12/2020   NA 141 12/12/2020   K 4.2 12/12/2020   CL 103 12/12/2020   CO2 25 12/12/2020   Lab Results  Component Value Date   ALT 32 12/12/2020   AST 22 01/12/2020   ALKPHOS 54 01/12/2020   BILITOT 0.7 01/12/2020   Lab Results  Component Value Date   HGBA1C 6.2 11/02/2020   HGBA1C 5.9 (H) 06/10/2020   HGBA1C 5.5 01/12/2020   HGBA1C 6.7 (H) 07/06/2019   HGBA1C 6.3 (H) 04/07/2019   Lab Results  Component Value Date   TSH 1.260 04/07/2019   Lab Results  Component Value Date   CHOL 115 12/12/2020   HDL 39 (L) 12/12/2020   LDLCALC 64 12/12/2020   TRIG 49 12/12/2020   CHOLHDL 2.9 12/12/2020   Lab Results  Component Value Date   VD25OH 30.5 11/02/2020   VD25OH 21.0 (L) 06/10/2020   VD25OH 23.9 (L) 01/12/2020   Lab Results  Component Value Date   WBC 5.8 01/12/2020   HGB 14.1 01/12/2020   HCT 41.3 01/12/2020   MCV 87 01/12/2020   PLT 261 01/12/2020   Attestation Statements:   Reviewed by clinician on day of visit: allergies, medications, problem list, medical history, surgical history, family history, social history, and  previous encounter notes.  I, 01/14/2020, CMA, am acting as transcriptionist for Insurance claims handler, DO  I have reviewed the above documentation for accuracy and completeness, and I agree with the above. -  Helane Rima, DO, MS, FAAFP, DABOM - Family and Bariatric Medicine.

## 2021-07-04 ENCOUNTER — Encounter: Payer: Self-pay | Admitting: *Deleted

## 2021-09-05 NOTE — Progress Notes (Unsigned)
    SUBJECTIVE:   CHIEF COMPLAINT / HPI:   Diabetic Follow Up: Patient is a 61 y.o. male who present today for diabetic follow up.   Patient endorses {rwdmsmartlistproblems:24882}  Home medications include: Ozempic 2 mg weekly Patient endorses taking these medications as prescribed.***  Most recent A1Cs:  Lab Results  Component Value Date   HGBA1C 6.2 11/02/2020   HGBA1C 5.9 (H) 06/10/2020   HGBA1C 5.5 01/12/2020   Last Microalbumin, LDL, Creatinine: Lab Results  Component Value Date   MICROALBUR 0.3 02/06/2017   LDLCALC 64 12/12/2020   CREATININE 1.32 (H) 12/12/2020   Patient {rwdoesdoesnot:24881} check blood glucose on a regular basis.  Patient {rwisisnot:24883} up to date on diabetic eye. Patient {rwisisnot:24883} up to date on diabetic foot exam. ***statin-last LDL 64-seen by cardiology 40/34/7425   Hypertension: Patient is a 61 y.o. male who present today for follow up of hypertension.   Patient endorses {rwdmsmartlistproblems:24882}  Home medications include:  Home meds-amlodipine 10 mg daily,labetalol 200 mg BID, losartan 100 mg daily, chlorthalidone 25 mg daily, 10 meq KCl Patient endorses taking these medications as prescribed.***  Most recent creatinine trend:  Lab Results  Component Value Date   CREATININE 1.32 (H) 12/12/2020   CREATININE 1.32 (H) 11/02/2020   CREATININE 1.23 01/12/2020   Patient {rwdoesdoesnot:24881} check blood pressure at home.  Patient {HAS HAS ZDG:38756} had a BMP in the past 1 year.  Health Maintenance Colonoscopy   PERTINENT  PMH / PSH: CVA hx, NSTEMI, OSA  OBJECTIVE:   There were no vitals taken for this visit.  ***  ASSESSMENT/PLAN:   No problem-specific Assessment & Plan notes found for this encounter.     Levin Erp, MD [facility]

## 2021-09-06 ENCOUNTER — Ambulatory Visit: Payer: Medicare Other | Admitting: Student

## 2021-09-06 ENCOUNTER — Encounter: Payer: Self-pay | Admitting: Student

## 2021-09-06 ENCOUNTER — Encounter (INDEPENDENT_AMBULATORY_CARE_PROVIDER_SITE_OTHER): Payer: Self-pay

## 2021-09-06 VITALS — BP 120/74 | HR 65 | Ht >= 80 in | Wt 336.5 lb

## 2021-09-06 DIAGNOSIS — Z13228 Encounter for screening for other metabolic disorders: Secondary | ICD-10-CM

## 2021-09-06 DIAGNOSIS — E1169 Type 2 diabetes mellitus with other specified complication: Secondary | ICD-10-CM | POA: Diagnosis not present

## 2021-09-06 DIAGNOSIS — I10 Essential (primary) hypertension: Secondary | ICD-10-CM | POA: Diagnosis not present

## 2021-09-06 LAB — POCT GLYCOSYLATED HEMOGLOBIN (HGB A1C): HbA1c, POC (controlled diabetic range): 5.8 % (ref 0.0–7.0)

## 2021-09-06 NOTE — Patient Instructions (Addendum)
It was great to see you! Thank you for allowing me to participate in your care!   I recommend that you always bring your medications to each appointment as this makes it easy to ensure we are on the correct medications and helps [name] not miss when refills are needed.  Our plans for today:  - Your A1c was 5.8 today, great job! - I would recommend statin medication for you given your history of heart attack/diabetes - will reach out to pharmacy for [facility] cost  We are checking some labs today, I will call you if they are abnormal will send you a MyChart message or a letter if they are normal.  If you do not hear about your labs in the next 2 weeks please let us know.  Take care and seek immediate care sooner if you develop any concerns. Please remember to show up 15 minutes before your scheduled appointment time!  Levin Erp, MD [facility]

## 2021-09-06 NOTE — Assessment & Plan Note (Signed)
149/88>120/74 on manual recheck. -Continue amlodipine 10 mg daily,labetalol 200 mg BID, losartan 100 mg daily, chlorthalidone 25 mg daily -consider pharmacy referral if BP becomes elevated -BMP

## 2021-09-06 NOTE — Assessment & Plan Note (Signed)
A1c 5.8 today -Ozempic 2 mg weekly -Health weight and wellness f/u -Lipid Panel, BMP -Discussed statin in depth, patient does not want to try currently, will discuss in future visits as well

## 2021-09-07 LAB — LIPID PANEL
Chol/HDL Ratio: 2.6 ratio (ref 0.0–5.0)
Cholesterol, Total: 102 mg/dL (ref 100–199)
HDL: 39 mg/dL — ABNORMAL LOW (ref >39)
LDL Chol Calc (NIH): 52 mg/dL (ref 0–99)
Triglycerides: 43 mg/dL (ref 0–149)
VLDL Cholesterol Cal: 11 mg/dL (ref 5–40)

## 2021-09-07 LAB — BASIC METABOLIC PANEL
BUN/Creatinine Ratio: 11 (ref 9–20)
BUN: 15 mg/dL (ref 6–24)
CO2: 20 mmol/L (ref 20–29)
Calcium: 9.3 mg/dL (ref 8.7–10.2)
Chloride: 103 mmol/L (ref 96–106)
Creatinine, Ser: 1.31 mg/dL — ABNORMAL HIGH (ref 0.76–1.27)
Glucose: 108 mg/dL — ABNORMAL HIGH (ref 70–99)
Potassium: 4.2 mmol/L (ref 3.5–5.2)
Sodium: 138 mmol/L (ref 134–144)
eGFR: 63 mL/min/{1.73_m2} (ref >59)

## 2021-09-07 LAB — HCV INTERPRETATION

## 2021-09-07 LAB — HCV AB W REFLEX TO QUANT PCR: HCV Ab: NONREACTIVE

## 2021-09-08 ENCOUNTER — Encounter: Payer: Self-pay | Admitting: Student

## 2021-09-21 ENCOUNTER — Ambulatory Visit: Payer: Medicare Other | Admitting: Neurology

## 2021-09-21 ENCOUNTER — Encounter: Payer: Self-pay | Admitting: Neurology

## 2021-09-21 VITALS — BP 118/75 | HR 73 | Ht >= 80 in | Wt 333.2 lb

## 2021-09-21 DIAGNOSIS — Z9989 Dependence on other enabling machines and devices: Secondary | ICD-10-CM | POA: Diagnosis not present

## 2021-09-21 DIAGNOSIS — G4733 Obstructive sleep apnea (adult) (pediatric): Secondary | ICD-10-CM

## 2021-09-21 NOTE — Patient Instructions (Signed)
It was nice to see you again, as always.  You are compliant with your current CPAP machine, but it is nearly 59 years old. If you see an error message on the display such as "motor life has been exceeded", please let us know.  We will proceed with a home sleep test to update your sleep apnea diagnosis and then issue a new machine afterwards.  If all goes well, you can follow-up routinely in 1 year for now.  If you get a new machine in the interim, you will need to schedule a follow-up within 3 months after starting your new machine.  This is primarily to fulfill insurance compliance requirements so your insurance will continue to cover the machine and supplies.

## 2021-09-21 NOTE — Progress Notes (Signed)
Subjective:    Patient ID: Brad [name] is a 61 y.o. male.  HPI    Interim history:   Brad [name] is a 61-year-old right-handed gentleman with an underlying medical history of hypertension, prior lacunar strokes, hemorrhagic stroke in April 2016, prior smoking and obesity, who presents for follow up consultation of his obstructive sleep apnea, on CPAP therapy, for his yearly checkup. The patient is unaccompanied today. I last saw him on 09/15/2020, at which time he was fully compliant with his CPAP and doing well.  He was working on weight loss.  He was followed by weight management.  He was advised to follow-up routinely in 1 year.  Today, 09/21/2021: I reviewed his CPAP compliance data from 08/22/2021 through 09/20/2021, which is a total of 30 days, during which time he used his machine every night with percent use days greater than 4 hours at 100%, indicating superb compliance with an average usage of 7 hours and 20 minutes, residual AHI at goal at 0.4/h, leak on the higher side with the 95th percentile at 27.2 L/min, pressure at 7 cm with EPR of 1.  He reports doing well overall.  Of note, he has been on this machine since September 2016 and would be eligible for new equipment.  He has not seen any error message on his machine and it seems to work fine.  He generally would be interested in getting a new machine eventually but is not keen on proceeding now.  He is stable on his medications, is on Ozempic and is working on weight loss.  He tries to hydrate well with water, he exercises regularly and goes to the gym several times a week.   Previously:  I saw him on 09/16/2019, at which time he was fully compliant with his CPAP of 7 cm.  He was doing well and advised to follow-up routinely in 1 year.   I reviewed his CPAP compliance data from the past 30 days from 08/16/2020 through 09/14/2020, during which time he used his machine 100% with percent use days greater than 4 hours also at 100%, indicating  superb compliance.  Average usage of 7 hours and 54 minutes, residual AHI at goal at 0.4/h, 95th percentile of leak at 12.7 L/min on a pressure of 7 cm with EPR of 1.      He missed an appointment on 08/12/19. I saw him on 08/11/2018, At which time he was compliant with his CPAP and doing well.  He was advised to follow-up in 1 year routinely.    I reviewed his CPAP compliance data from 08/16/2019 through 09/14/2019, which is a total of 30 days, during which time he used his machine every night with percent use days greater than 4 hours at 100%, indicating superb compliance with an average usage of 7 hours and 25 minutes, residual AHI at goal at 0.5/h, leak acceptable with a 95th percentile at 12.4 L/min on a pressure of 7 cm with EPR of 1.    I saw him on 08/08/2017, at which time he was compliant with his CPAP and doing well.  He was working on weight loss.  He was advised to follow-up in 1 year.    I reviewed his CPAP compliance data from 07/08/2018 through 078 2020 which is a total of 30 days, during which time he used his machine 29 days with percent used days greater than 4 hours at 97%, indicating excellent compliance with an average usage of 7 hours and 21 minutes,   residual AHI at goal at 0.3/h, leak acceptable with a 95th percentile at 13.4 L/min on a pressure of 7 cm with EPR of 1.  Set up date was 10/18/2014.   I reviewed his CPAP compliance data from 07/07/2017 through 08/05/2017 which is a total of 30 days, during which time he used his CPAP every night with percent used days greater than 4 hours at 97%, indicating excellent compliance with an average usage of 5 hours and 53 minutes, residual AHI at goal at 0.5 per hour, leak acceptable with the 95th percentile at 7.9 L/m on a pressure of 7 cm with EPR of 1.   I saw him on 08/08/2016, at which time he was compliant with CPAP and reported ongoing good results. Working on weight loss as well.   I reviewed his CPAP compliance data from 07/08/2016  through 08/06/2016, which is a total of 30 days, during which time he used his CPAP every night with percent used days greater than 4 hours at 90%, indicating excellent compliance with an average usage of 5 hours and 3 minutes, residual AHI low at 0.5 per hour, leak acceptable with the 95th percentile at 14.6 L/m on a pressure of 7 cm with EPR of 1.    I saw him 02/09/2015, at which time he reported doing fairly well. He was tolerating CPAP and he was compliant with treatment. He indicated that he would wake up rested and daytime energy was better. He was working on weight loss and exercising regularly. We talked about is his baseline sleep study results from August 2016 and his CPAP titration study results from September 2016 in detail at the time. He was encouraged to continue with CPAP therapy regularly.   I reviewed his CPAP compliance data from 07/09/2015 through 08/07/2015 which is a total of 30 days during which time he used his machine 24 days with percent used days greater than 4 hours at 60%, indicating suboptimal compliance with an average usage for all nights of 3 hours and 3 minutes, residual AHI 1.1 per hour, leak acceptable with the 95th percentile at 15.6 L/m on a pressure of 7 cm with EPR of 1.   I first met him on 07/20/2014 at the request of Dr. Ahern, at which time the patient reported snoring, multiple nighttime awakenings, nocturia and excessive daytime somnolence. I invited him back for sleep study. He had a baseline sleep study, followed by a CPAP titration study. I went over his test results with him in detail today. His baseline sleep study from 09/03/2014 showed a sleep efficiency of 63.9% with a latency to sleep of 81.5 minutes, and wake after sleep onset of 78 minutes with mild to moderate sleep fragmentation noted. He had an increased percentage of stage II sleep, absence of slow-wave sleep and REM sleep at 26.1% with a REM latency of 56 minutes. He had no significant PLMS, EKG or  EEG changes. He had mild to moderate snoring. He did not have any supine sleep. Total AHI was 10 per hour, rising to 20.4 per hour during REM sleep. Average oxygen saturation was 96%, nadir was 90%. Given his sleep related complaints and his history of stroke, I invited him back for a full night CPAP titration study. He had this on 10/08/2014: Sleep efficiency was 73.6% with a latency of sleep of 13.5 minutes and wake after sleep onset of 87 minutes with mild sleep fragmentation noted. He had an increased percentage of stage 2 sleep, absence of slow   wave sleep and increased REM sleep at 34.8% with a mildly reduced REM latency of 51.5 minutes. He had no significant PLMS, EKG or EEG changes. Average oxygen saturation was 94%, nadir was 90%. CPAP was titrated from 5 cm to 8 cm. AHI was 0 per hour on a pressure of 7 cm. Based on his test results I prescribed CPAP therapy for home use.   I reviewed his CPAP compliance data from 01/09/2015 through 02/07/2015 which is a total of 30 days during which time he used his machine 27 days with percent used days greater than 4 hours at 80%, indicating very good compliance with an average usage of 4 hours and 52 minutes, residual AHI low at 0.7 per hour, leak at times high with the 95th percentile at 28.7 L/m on a pressure of 7 cm with EPR of 1.   07/20/2014: He reports snoring, has multiple nighttime awakenings, has nocturia and excessive daytime somnolence. His ESS is 20/24. His snoring can be loud according to his wife, who provides most of his history. His bedtime varies. He may be in bed around 10 PM but typically does not fall asleep until late at night. He watches TV in bed. He may fall asleep around 2 AM. He goes to the bathroom multiple times each night, an average of 5 times per night. He denies morning headaches. He's currently not working. He works in Psychologist, educational at [employer]. He works 12 hour shifts, from 7 AM to 7 PM. He works in [place]. He lives in [place].  He denies restless leg symptoms. He's not known to twitch in his sleep according to his wife. He denies peripheral edema. He does not drink caffeine every day. He denies alcohol consumption. He quit smoking in April 2016. His rise time varies. He may be up by 5 AM.   His Past Medical History Is Significant For: Past Medical History:  Diagnosis Date   Abnormal ultrasound of carotid artery 04/2014   no significant obstruction   Ataxia 04/2014   Diabetes ([place])    EKG abnormalities 04/2014   poor R wave progression, NSR   Former smoker    25 years x 1.5 ppd, stopped 04/2014   Gait disturbance 04/2014   s/p stroke   H/O echocardiogram 04/2014   moderate LVH, 60-65% EF, no valve disease   History of heart attack    Hypertensive heart disease 04/2014   Intraparenchymal hemorrhage of brain (HCC) 05/05/2014   hospitalization [facility]   Obesity    Prediabetes    Short-term memory loss 04/2014   Sleep apnea    Stroke ([place]) 04/2014    His Past Surgical History Is Significant For: Past Surgical History:  Procedure Laterality Date   APPENDECTOMY     CHOLECYSTECTOMY     CIRCUMCISION     HEMORRHOID SURGERY      His Family History Is Significant For: Family History  Problem Relation Age of Onset   Heart disease Mother    Hypertension Mother    Sudden death Mother    Hypertension Brother    Aneurysm Paternal Grandmother     His Social History Is Significant For: Social History   Socioeconomic History   Marital status: Married    Spouse name: Web designer   Number of children: 2   Years of education: 12   Highest education level: Not on file  Occupational History   Occupation: Retired   Tobacco Use   Smoking status: Former    Packs/day:  1.50    Years: 25.00    Total pack years: 37.50    Types: Cigarettes    Quit date: 05/05/2014    Years since quitting: 7.3   Smokeless tobacco: Never  Vaping Use   Vaping Use: Never used  Substance and Sexual Activity    Alcohol use: No    Alcohol/week: 0.0 standard drinks of alcohol   Drug use: No   Sexual activity: Not on file  Other Topics Concern   Not on file  Social History Narrative   Lives at home with wife   Married to Crystal Rudnicki, non denominational, works at [employer] in [place], [place].  From [place], but moved to [place] 2016.     Caffeine use: Drinks tea occass   Social Determinants of Health   Financial Resource Strain: Not on file  Food Insecurity: Not on file  Transportation Needs: Not on file  Physical Activity: Not on file  Stress: Not on file  Social Connections: Not on file    His Allergies Are:  No Known Allergies:   His Current Medications Are:  Outpatient Encounter Medications as of 09/21/2021  Medication Sig   amLODipine (NORVASC) 10 MG tablet Take 1 tablet (10 mg total) by mouth daily.   blood glucose meter kit and supplies KIT Dispense based on patient and insurance preference. Check fasting blood sugar once daily ICD10 R73.03   chlorthalidone (HYGROTON) 25 MG tablet Take 1 tablet (25 mg total) by mouth daily.   glucose blood test strip Use as instructed to monitor FSBS 1x daily. Dx: R73.09   labetalol (NORMODYNE) 200 MG tablet TAKE 1 TABLET(200 MG) BY MOUTH TWICE DAILY   Lancets (ONETOUCH ULTRASOFT) lancets Use as instructed   losartan (COZAAR) 100 MG tablet Take 1 tablet (100 mg total) by mouth daily.   OZEMPIC, 2 MG/DOSE, 8 MG/3ML SOPN Inject 2 mg into the skin once a week.   tadalafil (CIALIS) 20 MG tablet TAKE 1 TABLET(20 MG) BY MOUTH DAILY AS NEEDED   Vitamin D, Ergocalciferol, (DRISDOL) 1.25 MG (50000 UNIT) CAPS capsule Take 1 capsule (50,000 Units total) by mouth every 7 (seven) days.   No facility-administered encounter medications on file as of 09/21/2021.  :  Review of Systems:  Out of a complete 14 point review of systems, all are reviewed and negative with the exception of these symptoms as listed below:  Review of Systems  Neurological:         Pt doing well. Brought machine to [facility] info.  ESS 8. Asking about another machine. Set up 10-18-14.    Objective:  Neurological Exam  Physical Exam Physical Examination:   Vitals:   09/21/21 0956  BP: 118/75  Pulse: 73   General Examination: The patient is a very pleasant 61 y.o. male in no acute distress. He appears well-developed and well-nourished and well groomed.   HEENT: Normocephalic, atraumatic, pupils are equal, round and reactive to light, tracking is good without limitation to gaze excursion or nystagmus noted. Normal smooth pursuit is noted.  Corrective eyeglasses in place.  Hearing is grossly intact. Face is symmetric with normal facial animation, speech is clear, no hypophonia. There is no lip, neck/head, jaw or voice tremor. Oropharynx exam reveals: No significant mouth dryness, stable findings, with airway crowding. Tongue protrudes centrally and palate elevates symmetrically. No carotid bruits.   Chest: Clear to auscultation without wheezing, rhonchi or crackles noted.   Heart: S1+S2+0, regular and normal without murmurs, rubs or gallops noted.      Abdomen: Soft, non-tender and non-distended.   Extremities: There are no new findings, no edema.    Skin: Warm and dry without trophic changes noted.    Musculoskeletal: exam reveals no obvious joint deformities.    Neurologically:  Mental status: The patient is awake, alert and oriented in all 4 spheres. His immediate and remote memory, attention, language skills and fund of knowledge are appropriate. There is no evidence of aphasia, agnosia, apraxia or anomia. Speech is clear with normal prosody and enunciation. Thought process is linear. Mood and affect are normal.  Cranial nerves II - XII are as described above under HEENT exam. Motor exam: Normal bulk, strength and tone is noted. There is no obvious tremor. Fine motor skills are grossly intact.  Sensory exam: intact to light touch.  Gait, station and balance: He  stands without difficulty. Posture is age-appropriate. Gait shows normal stride length and normal pace. No problems turning are noted.                  Assessment and Plan:    In summary, Brad [name] is a very pleasant 61-year old male with an underlying medical history of hypertension, hyperlipidemia, lacunar strokes, hemorrhagic stroke in April 2016, and obesity, who presents for follow-up consultation of his obstructive sleep apnea, well established on CPAP therapy at 7 cm with ongoing good results reported and excellent compliance.  Of note, he had a baseline sleep study in August 2016, and a subsequent CPAP titration study in September 2016 and has since then establish treatment at home, set up date was 10/18/2014. He has been working on weight loss, goes to the gym several days a week.  In addition, he goes to the medical weight management clinic. He is up-to-date with PAP supplies typically.  I renewed his prescription which we will send to his DME company.  He is commended for his treatment adherence with his CPAP and advised to follow-up routinely in 1 year.  He is not keen on getting a new machine currently, he is advised that we will proceed with a home sleep test in order to update his sleep apnea diagnosis and provide a new prescription for a new machine afterwards.  He has not had any problems with his current machine.  He is advised that sometimes the display will start showing an error message such as the motor life has been exceeded.  If he starts seeing this, we can proceed with a home sleep test and issue a new machine afterwards.  For now, we will plan to follow-up routinely in 1 year.  If he does get a new machine in the interim, he will need to schedule for 3 months after set up with his new equipment.  I answered all his questions today and he was in agreement with our plan.  I spent 30 minutes in total face-to-face time and in reviewing records during pre-charting, more than 50% of which  was spent in counseling and coordination of care, reviewing test results, reviewing medications and treatment regimen and/or in discussing or reviewing the diagnosis of OSA, the prognosis and treatment options. Pertinent laboratory and imaging test results that were available during this visit with the patient were reviewed by me and considered in my medical decision making (see chart for details).   

## 2021-10-12 ENCOUNTER — Encounter: Payer: Self-pay | Admitting: Neurology

## 2021-11-22 ENCOUNTER — Other Ambulatory Visit: Payer: Self-pay

## 2021-11-22 MED ORDER — OZEMPIC (2 MG/DOSE) 8 MG/3ML ~~LOC~~ SOPN
2.0000 mg | PEN_INJECTOR | SUBCUTANEOUS | 1 refills | Status: DC
  Filled 2021-11-22: qty 6, 56d supply, fill #0

## 2021-11-23 ENCOUNTER — Other Ambulatory Visit: Payer: Self-pay

## 2021-11-27 ENCOUNTER — Other Ambulatory Visit: Payer: Self-pay

## 2021-12-01 ENCOUNTER — Other Ambulatory Visit: Payer: Self-pay

## 2021-12-05 ENCOUNTER — Ambulatory Visit: Payer: Medicare Other | Admitting: Cardiovascular Disease

## 2022-01-14 ENCOUNTER — Encounter: Payer: Self-pay | Admitting: Cardiovascular Disease

## 2022-01-14 NOTE — Progress Notes (Unsigned)
Cardiology Office Note   Date:  01/15/2022   ID:  Brad [name], DOB [birth year], MRN [id number]  PCP:  Gerrit Heck, MD  Cardiologist:   Mertie Moores, MD   Chief Complaint  Patient presents with   Hypertension        Problem list: 1. 1. Malignant hypertension -    2. Intracranial hemorrhage caused by malignant hypertension   3. Non-ST segment elevation myocardial infarction-in the setting of malignant hypertension,  Echo shows normal LF function with EF of 60-65% with no segmental wall abn.     Brad [name] is a 61 y.o. male who presents for follow up for malignant hypertension and a type 2 non-ST segment myocardial infarction.  He's been doing fairly well from a cardiac point. He's not had any episodes of chest discomfort. He's currently in rehabilitation for intracranial hemorrhage. His blood pressure is much better controlled.  He has lost quite a bit of weight.   Oct. 26, 2016:  No Cp or dyspnea.  BP is fairly well controlled.   May 23, 2015:  Brad [name] is doing well.  BP is well controlled. Is recovering from his CVA  Walking    OCt. 23, 2017:   No CP or dyspnea.  Echo  [facility] [place], May 06, 2014 - Normal left ventricle systolic function - ejection fraction 60-65%. He has concentric left ventricular hypertrophy.  Works out every day   Jan. 23, 2018:  Feeling well.   Works out every day . Goes to the gym every day  Weight today is 329.  Sept. 13, 2018 Wt = 332 Brad [name] is seen today  BP is well controlled.  No CP or dyspnea  Had stopped the Aldactone,  Is back on it now Is ready to go on a diet - HbA1C is up  Labs from 9/11 look good  October 11, 2017: Has been working out regularly  Had lost some weight but has gained some back   November 11, 2018: Brad [name] is seen today for follow-up visit.  He has a history of hypertension.  Has had an intracranial bleed and a NSTEMI .  Wants to see a nutritionist.  Just getting back to the gym .  Has  been walking   November 24, 2019: Artery seen today for follow-up visit.  His blood pressure is well controlled.  Weight today is 232 pounds. He has a history of a non-ST segment elevation myocardial infarction and also has a history of an intracranial bleed. Is exercising regularly .  Is going to [employer] weight loss program.  Knees are feeling better with weigh loss  Oct. 31, 2022: Brad [name] is seen today for follow up fo his HTN, NSTEMI, obesity Wt is 333 lbs  Still goes to the gym Exercising 6 days a week .   Wants to improve his eating  Has started [employer] for weight loss    Dec. 18, 2023 Brad [name] is seen for follow up visit, hx of HTN, NSTEMI,  Wt is 336 lbs   Tries to avoid salt . Goes to the gym regularly    Past Medical History:  Diagnosis Date   Abnormal ultrasound of carotid artery 04/2014   no significant obstruction   Ataxia 04/2014   Diabetes ([place])    EKG abnormalities 04/2014   poor R wave progression, NSR   Former smoker    25 years x 1.5 ppd, stopped 04/2014   Gait disturbance 04/2014   s/p stroke   H/O echocardiogram  04/2014   moderate LVH, 60-65% EF, no valve disease   History of heart attack    Hypertensive heart disease 04/2014   Intraparenchymal hemorrhage of brain [facility]) 05/05/2014   hospitalization [facility]   Obesity    Prediabetes    Short-term memory loss 04/2014   Sleep apnea    Stroke ([place]) 04/2014    Past Surgical History:  Procedure Laterality Date   APPENDECTOMY     CHOLECYSTECTOMY     CIRCUMCISION     HEMORRHOID SURGERY       Current Outpatient Medications  Medication Sig Dispense Refill   amLODipine (NORVASC) 10 MG tablet Take 1 tablet (10 mg total) by mouth daily. 90 tablet 3   blood glucose meter kit and supplies KIT Dispense based on patient and insurance preference. Check fasting blood sugar once daily ICD10 R73.03 1 each 0   chlorthalidone (HYGROTON) 25 MG tablet Take 1 tablet (25 mg total) by  mouth daily. 30 tablet 2   glucose blood test strip Use as instructed to monitor FSBS 1x daily. Dx: R73.09 100 each 12   labetalol (NORMODYNE) 200 MG tablet TAKE 1 TABLET(200 MG) BY MOUTH TWICE DAILY 180 tablet 3   Lancets (ONETOUCH ULTRASOFT) lancets Use as instructed 100 each 12   losartan (COZAAR) 100 MG tablet Take 1 tablet (100 mg total) by mouth daily. 90 tablet 3   MOUNJARO 10 MG/0.5ML Pen Inject 10 mg into the skin once a week.     potassium chloride (KLOR-CON) 10 MEQ tablet Take 10 mEq by mouth daily.     tadalafil (CIALIS) 20 MG tablet TAKE 1 TABLET(20 MG) BY MOUTH DAILY AS NEEDED 30 tablet 3   Vitamin D, Ergocalciferol, (DRISDOL) 1.25 MG (50000 UNIT) CAPS capsule Take 1 capsule (50,000 Units total) by mouth every 7 (seven) days. 8 capsule 0   No current facility-administered medications for this visit.    Allergies:   Patient has no known allergies.    Social History:  The patient  reports that he quit smoking about 7 years ago. His smoking use included cigarettes. He has a 37.50 pack-year smoking history. He has never used smokeless tobacco. He reports that he does not drink alcohol and does not use drugs.   Family History:  The patient's family history includes Aneurysm in his paternal grandmother; Heart disease in his mother; Hypertension in his brother and mother; Sudden death in his mother.    ROS:  Please see the history of present illness.     Physical Exam: Blood pressure 124/80, pulse 66, height _0  (2.057 m), weight (!) 336 lb 9.6 oz (152.7 kg), SpO2 98 %.       GEN:   Tall man,  moderately obese , in no acute distress HEENT: Normal NECK: No JVD; No carotid bruits LYMPHATICS: No lymphadenopathy CARDIAC: RRR , no murmurs, rubs, gallops RESPIRATORY:  Clear to auscultation without rales, wheezing or rhonchi  ABDOMEN: Soft, non-tender, non-distended MUSCULOSKELETAL:  No edema; No deformity  SKIN: Warm and dry NEUROLOGIC:  Alert and oriented x 3    EKG:     January 15, 2022: Normal sinus rhythm at 66.  Left axis deviation.  No ST or T wave changes.  Recent Labs: 09/06/2021: BUN 15; Creatinine, Ser 1.31; Potassium 4.2; Sodium 138    Lipid Panel    Component Value Date/Time   CHOL 102 09/06/2021 1021   CHOL 84 (L) 10/01/2014 0001   TRIG 43 09/06/2021 1021   TRIG 51  10/01/2014 0001   HDL 39 (L) 09/06/2021 1021   HDL 40 10/01/2014 0001   CHOLHDL 2.6 09/06/2021 1021   CHOLHDL 2.8 06/07/2017 0926   VLDL 9 11/21/2015 1124   LDLCALC 52 09/06/2021 1021   LDLCALC 61 06/07/2017 0926   LDLCALC 34 10/01/2014 0001      Wt Readings from Last 3 Encounters:  01/15/22 (!) 336 lb 9.6 oz (152.7 kg)  09/21/21 (!) 333 lb 3.2 oz (151.1 kg)  09/06/21 (!) 336 lb 8 oz (152.6 kg)      Other studies Reviewed: Additional studies/ records that were reviewed today include: . Review of the above records demonstrates:    ASSESSMENT AND PLAN:  1.  Malignant hypertension -blood pressure actually looks good today .  Continue current meds  .  2. Intracranial hemorrhage   -stable.  3. Non-ST segment elevation myocardial infarction-denies having any episodes of angina.   1.  Obesity: Have encouraged him to work on weight loss.  Had a long discussion about diet.  He still eats a fair number of carbs.  We have asked him to reduce his carbohydrates.      Current medicines are reviewed at length with the patient today.  The patient does not have concerns regarding medicines.  The following changes have been made:  no change  Labs/ tests ordered today include:   Orders Placed This Encounter  Procedures   EKG 12-Lead     Disposition:   FU with me in 1 year    Mertie Moores, MD  01/15/2022 6:18 PM    [facility] [address], [place], [place]  [address] Phone: [phone]; Fax: [phone]

## 2022-01-15 ENCOUNTER — Ambulatory Visit: Payer: Medicare Other | Attending: Cardiovascular Disease | Admitting: Cardiovascular Disease

## 2022-01-15 ENCOUNTER — Encounter: Payer: Self-pay | Admitting: Cardiovascular Disease

## 2022-01-15 VITALS — BP 124/80 | HR 66 | Ht >= 80 in | Wt 336.6 lb

## 2022-01-15 DIAGNOSIS — I10 Essential (primary) hypertension: Secondary | ICD-10-CM

## 2022-01-15 DIAGNOSIS — I2510 Atherosclerotic heart disease of native coronary artery without angina pectoris: Secondary | ICD-10-CM

## 2022-01-15 NOTE — Patient Instructions (Signed)
Medication Instructions:  Your physician recommends that you continue on your current medications as directed. Please refer to the Current Medication list given to you today.  *If you need a refill on your cardiac medications before your next appointment, please call your pharmacy*   Lab Work: NONE If you have labs (blood work) drawn today and your tests are completely normal, you will receive your results only by: MyChart Message (if you have MyChart) OR A paper copy in the mail If you have any lab test that is abnormal or we need to change your treatment, we will call you to review the results.   Testing/Procedures: NONE   Follow-Up: At [facility], you and your health needs are our priority.  As part of our continuing mission to provide you with exceptional heart care, we have created designated Provider Care Teams.  These Care Teams include your primary Cardiologist (physician) and Advanced Practice Providers (APPs -  Physician Assistants and Nurse Practitioners) who all work together to provide you with the care you need, when you need it.  We recommend signing up for the patient portal called "MyChart".  Sign up information is provided on this After Visit Summary.  MyChart is used to connect with patients for Virtual Visits (Telemedicine).  Patients are able to view lab/test results, encounter notes, upcoming appointments, etc.  Non-urgent messages can be sent to your provider as well.   To learn more about what you can do with MyChart, go to [email or web address].    Your next appointment:   1 year(s)  The format for your next appointment:   In Person  Provider:   Kristeen Miss, MD      Important Information About Sugar       Adopting a Healthy Lifestyle.   Weight: Know what a healthy weight is for you (roughly BMI <25) and aim to maintain this. You can calculate your body mass index on your smart phone  Diet: Aim for 7+ servings of fruits and  vegetables daily Limit animal fats in diet for cholesterol and heart health - choose grass fed whenever available Avoid highly processed foods (fast food burgers, tacos, fried chicken, pizza, hot dogs, french fries)  Saturated fat comes in the form of butter, lard, coconut oil, margarine, partially hydrogenated oils, and fat in meat. These increase your risk of cardiovascular disease.  Use healthy plant oils, such as olive, canola, soy, corn, sunflower and peanut.  Whole foods such as fruits, vegetables and whole grains have fiber  Men need > 38 grams of fiber per day Women need > 25 grams of fiber per day  Load up on vegetables and fruits - one-half of your plate: Aim for color and variety, and remember that potatoes dont count. Go for whole grains - one-quarter of your plate: Whole wheat, barley, wheat berries, quinoa, oats, brown rice, and foods made with them. If you want pasta, go with whole wheat pasta. Protein power - one-quarter of your plate: Fish, chicken, beans, and nuts are all healthy, versatile protein sources. Limit red meat. You need carbohydrates for energy! The type of carbohydrate is more important than the amount. Choose carbohydrates such as vegetables, fruits, whole grains, beans, and nuts in the place of Albers rice, Mantel pasta, potatoes (baked or fried), macaroni and cheese, cakes, cookies, and donuts.  If youre thirsty, drink water. Coffee and tea are good in moderation, but skip sugary drinks and limit milk and dairy products to one or two daily servings. Keep  sugar intake at 6 teaspoons or 24 grams or LESS       Exercise: Aim for 150 min of moderate intensity exercise weekly for heart health, and weights twice weekly for bone health Stay active - any steps are better than no steps! Aim for 7-9 hours of sleep daily     Avoid foods that are Basu, wheat, sweet  - Friedhoff foods - limit your  rice, pasta, potatoes, corn.  if you do eat these, make sure its brown rice,  whole wheat pasta  - wheat - limit intake of all bread, biscuits, pizza dough.   The bread you do eat should be whole wheat bread.  - Sweets - limit cakes, cookies, desserts, ice cream, soda, sweet tea   Increase exercise

## 2022-05-16 LAB — HM DIABETES EYE EXAM

## 2022-07-30 ENCOUNTER — Ambulatory Visit (INDEPENDENT_AMBULATORY_CARE_PROVIDER_SITE_OTHER): Payer: Medicare Other

## 2022-07-30 VITALS — Ht >= 80 in | Wt 327.0 lb

## 2022-07-30 DIAGNOSIS — Z0000 Encounter for general adult medical examination without abnormal findings: Secondary | ICD-10-CM

## 2022-07-30 DIAGNOSIS — Z1211 Encounter for screening for malignant neoplasm of colon: Secondary | ICD-10-CM

## 2022-07-30 NOTE — Progress Notes (Signed)
Subjective:   Brad [name] is a 62 y.o. male who presents for an Initial Medicare Annual Wellness Visit.  Visit Complete: Virtual  I connected with  Brad [name] on 07/30/22 by a audio enabled telemedicine application and verified that I am speaking with the correct person using two identifiers.  Patient Location: Home  Provider Location: Home Office  I discussed the limitations of evaluation and management by telemedicine. The patient expressed understanding and agreed to proceed.  Review of Systems     Cardiac Risk Factors include: advanced age (>81men, >66 women);diabetes mellitus;hypertension;male gender;dyslipidemia     Objective:    Today's Vitals   07/30/22 0956  Weight: (!) 327 lb (148.3 kg)  Height: 6\' 9"  (2.057 m)   Body mass index is 35.04 kg/m.     07/30/2022    5:10 PM 09/06/2021    8:49 AM 11/02/2020   10:32 AM 06/10/2020    9:08 AM  Advanced Directives  Does Patient Have a Medical Advance Directive? No No No No  Would patient like information on creating a medical advance directive? Yes (MAU/Ambulatory/Procedural Areas - Information given) No - Patient declined No - Patient declined No - Patient declined    Current Medications (verified) Outpatient Encounter Medications as of 07/30/2022  Medication Sig   amLODipine (NORVASC) 10 MG tablet Take 1 tablet (10 mg total) by mouth daily.   blood glucose meter kit and supplies KIT Dispense based on patient and insurance preference. Check fasting blood sugar once daily ICD10 R73.03   chlorthalidone (HYGROTON) 25 MG tablet Take 1 tablet (25 mg total) by mouth daily.   glucose blood test strip Use as instructed to monitor FSBS 1x daily. Dx: R73.09   labetalol (NORMODYNE) 200 MG tablet TAKE 1 TABLET(200 MG) BY MOUTH TWICE DAILY   Lancets (ONETOUCH ULTRASOFT) lancets Use as instructed   losartan (COZAAR) 100 MG tablet Take 1 tablet (100 mg total) by mouth daily.   MOUNJARO 10 MG/0.5ML Pen Inject 10 mg into the skin once  a week.   potassium chloride (KLOR-CON) 10 MEQ tablet Take 10 mEq by mouth daily.   tadalafil (CIALIS) 20 MG tablet TAKE 1 TABLET(20 MG) BY MOUTH DAILY AS NEEDED   Vitamin D, Ergocalciferol, (DRISDOL) 1.25 MG (50000 UNIT) CAPS capsule Take 1 capsule (50,000 Units total) by mouth every 7 (seven) days.   No facility-administered encounter medications on file as of 07/30/2022.    Allergies (verified) Patient has no known allergies.   History: Past Medical History:  Diagnosis Date   Abnormal ultrasound of carotid artery 04/2014   no significant obstruction   Ataxia 04/2014   Diabetes (HCC)    EKG abnormalities 04/2014   poor R wave progression, NSR   Former smoker    25 years x 1.5 ppd, stopped 04/2014   Gait disturbance 04/2014   s/p stroke   H/O echocardiogram 04/2014   moderate LVH, 60-65% EF, no valve disease   History of heart attack    Hypertensive heart disease 04/2014   Intraparenchymal hemorrhage of brain [facility]) 05/05/2014   hospitalization [facility]   Obesity    Prediabetes    Short-term memory loss 04/2014   Sleep apnea    Stroke (HCC) 04/2014   Past Surgical History:  Procedure Laterality Date   APPENDECTOMY     CHOLECYSTECTOMY     CIRCUMCISION     HEMORRHOID SURGERY     Family History  Problem Relation Age of Onset   Heart disease Mother  Hypertension Mother    Sudden death Mother    Hypertension Brother    Aneurysm Paternal Grandmother    Social History   Socioeconomic History   Marital status: Married    Spouse name: Proofreader   Number of children: 2   Years of education: 12   Highest education level: Not on file  Occupational History   Occupation: Retired   Tobacco Use   Smoking status: Former    Packs/day: 1.50    Years: 25.00    Additional pack years: 0.00    Total pack years: 37.50    Types: Cigarettes    Quit date: 05/05/2014    Years since quitting: 8.2   Smokeless tobacco: Never  Vaping Use   Vaping Use: Never  used  Substance and Sexual Activity   Alcohol use: No    Alcohol/week: 0.0 standard drinks of alcohol   Drug use: No   Sexual activity: Not on file  Other Topics Concern   Not on file  Social History Narrative   Lives at home with wife   Married to [name], non denominational, works at [employer] in [place], [place].  From [place], but moved to [place] 2016.     Caffeine use: Drinks tea occass   Social Determinants of Health   Financial Resource Strain: Low Risk  (07/30/2022)   Overall Financial Resource Strain (CARDIA)    Difficulty of Paying Living Expenses: Not hard at all  Food Insecurity: No Food Insecurity (07/30/2022)   Hunger Vital Sign    Worried About Running Out of Food in the Last Year: Never true    Ran Out of Food in the Last Year: Never true  Transportation Needs: No Transportation Needs (07/30/2022)   PRAPARE - Administrator, Civil Service (Medical): No    Lack of Transportation (Non-Medical): No  Physical Activity: Insufficiently Active (07/30/2022)   Exercise Vital Sign    Days of Exercise per Week: 3 days    Minutes of Exercise per Session: 30 min  Stress: No Stress Concern Present (07/30/2022)   [employer] of Occupational Health - Occupational Stress Questionnaire    Feeling of Stress : Not at all  Social Connections: Moderately Integrated (07/30/2022)   Social Connection and Isolation Panel [NHANES]    Frequency of Communication with Friends and Family: More than three times a week    Frequency of Social Gatherings with Friends and Family: Three times a week    Attends Religious Services: More than 4 times per year    Active Member of Clubs or Organizations: No    Attends Banker Meetings: Never    Marital Status: Married    Tobacco Counseling Counseling given: Not Answered   Clinical Intake:  Pre-visit preparation completed: Yes  Pain : No/denies pain     Diabetes: Yes CBG done?: No Did pt. bring in CBG monitor  from home?: No  How often do you need to have someone help you when you read instructions, pamphlets, or other written materials from your doctor or pharmacy?: 1 - Never  Interpreter Needed?: No  Information entered by :: Kandis Fantasia LPN   Activities of Daily Living    07/30/2022    4:24 PM  In your present state of health, do you have any difficulty performing the following activities:  Hearing? 0  Vision? 0  Difficulty concentrating or making decisions? 0  Walking or climbing stairs? 0  Dressing or bathing? 0  Doing errands, shopping?  0  Preparing Food and eating ? N  Using the Toilet? N  In the past six months, have you accidently leaked urine? N  Do you have problems with loss of bowel control? N  Managing your Medications? N  Managing your Finances? N  Housekeeping or managing your Housekeeping? N    Patient Care Team: Levin Erp, MD as PCP - General (Family Medicine) [name], Deloris Ping, [name], [facility]  Indicate any recent Medical Services you may have received from other than [facility] providers in the past year (date may be approximate).     Assessment:   This is a routine wellness examination for Brad [name].  Hearing/Vision screen Hearing Screening - Comments:: Denies hearing difficulties   Vision Screening - Comments:: Wears rx glasses - up to date with routine eye exams with [facility]    Dietary issues and exercise activities discussed:     Goals Addressed             This Visit's Progress    Remain active and independent        Depression Screen    07/30/2022    4:26 PM 09/06/2021    8:50 AM 11/02/2020   10:33 AM 04/07/2019   10:41 AM 11/25/2018   11:22 AM 02/18/2018   12:43 PM 06/07/2017    9:05 AM  PHQ 2/9 Scores  PHQ - 2 Score 0 0 0 2 0 0 0  PHQ- 9 Score  0 0 6       Fall Risk    07/30/2022    5:17 PM 11/25/2018   11:22 AM 02/18/2018   12:43 PM 06/07/2017    9:05 AM 02/06/2017     9:10 AM  Fall Risk   Falls in the past year? 0 0 0 No No  Number falls in past yr: 0      Injury with Fall? 0      Risk for fall due to : No Fall Risks      Follow up Falls prevention discussed;Education provided;Falls evaluation completed Falls evaluation completed Falls evaluation completed      MEDICARE RISK AT HOME:  Medicare Risk at Home - 07/30/22 1718     Any stairs in or around the home? No    If so, are there any without handrails? No    Home free of loose throw rugs in walkways, pet beds, electrical cords, etc? Yes    Adequate lighting in your home to reduce risk of falls? Yes    Life alert? No    Use of a cane, walker or w/c? No    Grab bars in the bathroom? Yes    Shower chair or bench in shower? No    Elevated toilet seat or a handicapped toilet? Yes             TIMED UP AND GO:  Was the test performed? No    Cognitive Function:        07/30/2022    5:18 PM  6CIT Screen  What Year? 0 points  What month? 0 points  What time? 0 points  Count back from 20 0 points  Months in reverse 0 points  Repeat phrase 0 points  Total Score 0 points    Immunizations Immunization History  Administered Date(s) Administered   Influenza,inj,Quad PF,6+ Mos 09/14/2018, 11/02/2020   Moderna SARS-COV2 Booster Vaccination 04/04/2019, 10/23/2019, 04/29/2020   Moderna Sars-Covid-2 Vaccination 05/02/2019   Tdap 03/01/2015  TDAP status: Up to date  Pneumococcal vaccine status: Up to date  Covid-19 vaccine status: Information provided on how to obtain vaccines.   Qualifies for Shingles Vaccine? Yes   Zostavax completed No   Shingrix Completed?: No.    Education has been provided regarding the importance of this vaccine. Patient has been advised to call insurance company to determine out of pocket expense if they have not yet received this vaccine. Advised may also receive vaccine at local pharmacy or Health Dept. Verbalized acceptance and understanding.  Screening  Tests Health Maintenance  Topic Date Due   HIV Screening  Never done   Colonoscopy  Never done   Lung Cancer Screening  Never done   Zoster Vaccines- Shingrix (1 of 2) Never done   Diabetic kidney evaluation - Urine ACR  02/06/2018   FOOT EXAM  11/25/2019   COVID-19 Vaccine (5 - 2023-24 season) 09/29/2021   HEMOGLOBIN A1C  03/09/2022   INFLUENZA VACCINE  08/30/2022   Diabetic kidney evaluation - eGFR measurement  09/07/2022   OPHTHALMOLOGY EXAM  05/16/2023   Medicare Annual Wellness (AWV)  07/30/2023   DTaP/Tdap/Td (2 - Td or Tdap) 02/28/2025   Hepatitis C Screening  Completed   HPV VACCINES  Aged Out    Health Maintenance  Health Maintenance Due  Topic Date Due   HIV Screening  Never done   Colonoscopy  Never done   Lung Cancer Screening  Never done   Zoster Vaccines- Shingrix (1 of 2) Never done   Diabetic kidney evaluation - Urine ACR  02/06/2018   FOOT EXAM  11/25/2019   COVID-19 Vaccine (5 - 2023-24 season) 09/29/2021   HEMOGLOBIN A1C  03/09/2022    Colorectal cancer screening: Referral to GI placed today and will request last colonoscopy from [facility] in [place]. Pt aware the office will call re: appt.  Lung Cancer Screening: (Low Dose CT Chest recommended if Age 27-80 years, 20 pack-year currently smoking OR have quit w/in 15years.) does qualify.   Lung Cancer Screening Referral: will discuss with pcp  Additional Screening:  Hepatitis C Screening: does qualify; Completed 09/06/21  Vision Screening: Recommended annual ophthalmology exams for early detection of glaucoma and other disorders of the eye. Is the patient up to date with their annual eye exam?  No  Who is the provider or what is the name of the office in which the patient attends annual eye exams? [facility] If pt is not established with a provider, would they like to be referred to a provider to establish care? No .   Dental Screening: Recommended annual dental exams for proper oral  hygiene  Diabetic Foot Exam: Diabetic Foot Exam: Overdue, Pt has been advised about the importance in completing this exam. Pt is scheduled for diabetic foot exam on at next office visit.  Community Resource Referral / Chronic Care Management: CRR required this visit?  No   CCM required this visit?  No    Plan:     I have personally reviewed and noted the following in the patient's chart:   Medical and social history Use of alcohol, tobacco or illicit drugs  Current medications and supplements including opioid prescriptions. Patient is not currently taking opioid prescriptions. Functional ability and status Nutritional status Physical activity Advanced directives List of other physicians Hospitalizations, surgeries, and ER visits in previous 12 months Vitals Screenings to include cognitive, depression, and falls Referrals and appointments  In addition, I have reviewed and discussed with patient certain  preventive protocols, quality metrics, and best practice recommendations. A written personalized care plan for preventive services as well as general preventive health recommendations were provided to patient.     Kandis Fantasia [place], [place]   01/29/9145   After Visit Summary: (MyChart) Due to this being a telephonic visit, the after visit summary with patients personalized plan was offered to patient via MyChart   Nurse Notes: No concerns

## 2022-07-30 NOTE — Patient Instructions (Signed)
Brad [name] , Thank you for taking time to come for your Medicare Wellness Visit. I appreciate your ongoing commitment to your health goals. Please review the following plan we discussed and let me know if I can assist you in the future.   These are the goals we discussed:  Goals      Remain active and independent        This is a list of the screening recommended for you and due dates:  Health Maintenance  Topic Date Due   HIV Screening  Never done   Colon Cancer Screening  Never done   Screening for Lung Cancer  Never done   Zoster (Shingles) Vaccine (1 of 2) Never done   Yearly kidney health urinalysis for diabetes  02/06/2018   Complete foot exam   11/25/2019   COVID-19 Vaccine (5 - 2023-24 season) 09/29/2021   Hemoglobin A1C  03/09/2022   Flu Shot  08/30/2022   Yearly kidney function blood test for diabetes  09/07/2022   Eye exam for diabetics  05/16/2023   Medicare Annual Wellness Visit  07/30/2023   DTaP/Tdap/Td vaccine (2 - Td or Tdap) 02/28/2025   Hepatitis C Screening  Completed   HPV Vaccine  Aged Out    Advanced directives: Information on Advanced Care Planning can be found at [facility] Advance Health Care Directives Advance Health Care Directives ([email or web address] Please bring a copy of your health care power of attorney and living will to the office to be added to your chart at your convenience.  Conditions/risks identified: Aim for 30 minutes of exercise or brisk walking, 6-8 glasses of water, and 5 servings of fruits and vegetables each day.  Next appointment: Follow up in one year for your annual wellness visit   Preventive Care 40-64 Years, Male Preventive care refers to lifestyle choices and visits with your health care provider that can promote health and wellness. What does preventive care include? A yearly physical exam. This is also called an annual well check. Dental exams once or twice a year. Routine eye exams. Ask your health care  provider how often you should have your eyes checked. Personal lifestyle choices, including: Daily care of your teeth and gums. Regular physical activity. Eating a healthy diet. Avoiding tobacco and drug use. Limiting alcohol use. Practicing safe sex. Taking low-dose aspirin every day starting at age 55. What happens during an annual well check? The services and screenings done by your health care provider during your annual well check will depend on your age, overall health, lifestyle risk factors, and family history of disease. Counseling  Your health care provider may ask you questions about your: Alcohol use. Tobacco use. Drug use. Emotional well-being. Home and relationship well-being. Sexual activity. Eating habits. Work and work Astronomer. Screening  You may have the following tests or measurements: Height, weight, and BMI. Blood pressure. Lipid and cholesterol levels. These may be checked every 5 years, or more frequently if you are over 69 years old. Skin check. Lung cancer screening. You may have this screening every year starting at age 16 if you have a 30-pack-year history of smoking and currently smoke or have quit within the past 15 years. Fecal occult blood test (FOBT) of the stool. You may have this test every year starting at age 54. Flexible sigmoidoscopy or colonoscopy. You may have a sigmoidoscopy every 5 years or a colonoscopy every 10 years starting at age 78. Prostate cancer screening. Recommendations will vary depending on  your family history and other risks. Hepatitis C blood test. Hepatitis B blood test. Sexually transmitted disease (STD) testing. Diabetes screening. This is done by checking your blood sugar (glucose) after you have not eaten for a while (fasting). You may have this done every 1-3 years. Discuss your test results, treatment options, and if necessary, the need for more tests with your health care provider. Vaccines  Your health care  provider may recommend certain vaccines, such as: Influenza vaccine. This is recommended every year. Tetanus, diphtheria, and acellular pertussis (Tdap, Td) vaccine. You may need a Td booster every 10 years. Zoster vaccine. You may need this after age 42. Pneumococcal 13-valent conjugate (PCV13) vaccine. You may need this if you have certain conditions and have not been vaccinated. Pneumococcal polysaccharide (PPSV23) vaccine. You may need one or two doses if you smoke cigarettes or if you have certain conditions. Talk to your health care provider about which screenings and vaccines you need and how often you need them. This information is not intended to replace advice given to you by your health care provider. Make sure you discuss any questions you have with your health care provider. Document Released: 02/11/2015 Document Revised: 10/05/2015 Document Reviewed: 11/16/2014 Elsevier Interactive Patient Education  2017 [employer].  Fall Prevention in the Home Falls can cause injuries. They can happen to people of all ages. There are many things you can do to make your home safe and to help prevent falls. What can I do on the outside of my home? Regularly fix the edges of walkways and driveways and fix any cracks. Remove anything that might make you trip as you walk through a door, such as a raised step or threshold. Trim any bushes or trees on the path to your home. Use bright outdoor lighting. Clear any walking paths of anything that might make someone trip, such as rocks or tools. Regularly check to see if handrails are loose or broken. Make sure that both sides of any steps have handrails. Any raised decks and porches should have guardrails on the edges. Have any leaves, snow, or ice cleared regularly. Use sand or salt on walking paths during winter. Clean up any spills in your garage right away. This includes oil or grease spills. What can I do in the bathroom? Use night  lights. Install grab bars by the toilet and in the tub and shower. Do not use towel bars as grab bars. Use non-skid mats or decals in the tub or shower. If you need to sit down in the shower, use a plastic, non-slip stool. Keep the floor dry. Clean up any water that spills on the floor as soon as it happens. Remove soap buildup in the tub or shower regularly. Attach bath mats securely with double-sided non-slip rug tape. Do not have throw rugs and other things on the floor that can make you trip. What can I do in the bedroom? Use night lights. Make sure that you have a light by your bed that is easy to reach. Do not use any sheets or blankets that are too big for your bed. They should not hang down onto the floor. Have a firm chair that has side arms. You can use this for support while you get dressed. Do not have throw rugs and other things on the floor that can make you trip. What can I do in the kitchen? Clean up any spills right away. Avoid walking on wet floors. Keep items that you use a  lot in easy-to-reach places. If you need to reach something above you, use a strong step stool that has a grab bar. Keep electrical cords out of the way. Do not use floor polish or wax that makes floors slippery. If you must use wax, use non-skid floor wax. Do not have throw rugs and other things on the floor that can make you trip. What can I do with my stairs? Do not leave any items on the stairs. Make sure that there are handrails on both sides of the stairs and use them. Fix handrails that are broken or loose. Make sure that handrails are as long as the stairways. Check any carpeting to make sure that it is firmly attached to the stairs. Fix any carpet that is loose or worn. Avoid having throw rugs at the top or bottom of the stairs. If you do have throw rugs, attach them to the floor with carpet tape. Make sure that you have a light switch at the top of the stairs and the bottom of the stairs. If  you do not have them, ask someone to add them for you. What else can I do to help prevent falls? Wear shoes that: Do not have high heels. Have rubber bottoms. Are comfortable and fit you well. Are closed at the toe. Do not wear sandals. If you use a stepladder: Make sure that it is fully opened. Do not climb a closed stepladder. Make sure that both sides of the stepladder are locked into place. Ask someone to hold it for you, if possible. Clearly mark and make sure that you can see: Any grab bars or handrails. First and last steps. Where the edge of each step is. Use tools that help you move around (mobility aids) if they are needed. These include: Canes. Walkers. Scooters. Crutches. Turn on the lights when you go into a dark area. Replace any light bulbs as soon as they burn out. Set up your furniture so you have a clear path. Avoid moving your furniture around. If any of your floors are uneven, fix them. If there are any pets around you, be aware of where they are. Review your medicines with your doctor. Some medicines can make you feel dizzy. This can increase your chance of falling. Ask your doctor what other things that you can do to help prevent falls. This information is not intended to replace advice given to you by your health care provider. Make sure you discuss any questions you have with your health care provider. Document Released: 11/11/2008 Document Revised: 06/23/2015 Document Reviewed: 02/19/2014 Elsevier Interactive Patient Education  2017 [employer].

## 2022-08-27 ENCOUNTER — Encounter: Payer: Self-pay | Admitting: Family Medicine

## 2022-08-27 ENCOUNTER — Ambulatory Visit: Payer: Medicare Other | Admitting: Family Medicine

## 2022-08-27 ENCOUNTER — Other Ambulatory Visit: Payer: Self-pay

## 2022-08-27 VITALS — BP 137/84 | HR 73 | Ht >= 80 in | Wt 330.0 lb

## 2022-08-27 DIAGNOSIS — E1169 Type 2 diabetes mellitus with other specified complication: Secondary | ICD-10-CM

## 2022-08-27 DIAGNOSIS — Z114 Encounter for screening for human immunodeficiency virus [HIV]: Secondary | ICD-10-CM | POA: Diagnosis not present

## 2022-08-27 DIAGNOSIS — R310 Gross hematuria: Secondary | ICD-10-CM

## 2022-08-27 LAB — POCT GLYCOSYLATED HEMOGLOBIN (HGB A1C): HbA1c, POC (controlled diabetic range): 6 % (ref 0.0–7.0)

## 2022-08-27 LAB — POCT URINALYSIS DIP (MANUAL ENTRY)
Glucose, UA: NEGATIVE mg/dL
Ketones, POC UA: NEGATIVE mg/dL
Nitrite, UA: NEGATIVE
Protein Ur, POC: 100 mg/dL — AB
Spec Grav, UA: 1.025 (ref 1.010–1.025)
Urobilinogen, UA: 1 E.U./dL
pH, UA: 5.5 (ref 5.0–8.0)

## 2022-08-27 LAB — POCT UA - MICROSCOPIC ONLY: Epithelial cells, urine per micros: NONE SEEN

## 2022-08-27 NOTE — Patient Instructions (Signed)
It was great to see you again today.  Checking labs and CT scan Will be in touch as results come back Please call or message if you have any questions  Be well, Dr. Pollie Meyer  Hematuria, Adult Hematuria is blood in the urine. Blood may be visible in the urine, or it may be identified with a test. This condition can be caused by infections of the bladder, urethra, kidney, or prostate. Other possible causes include: Kidney stones. Cancer of the urinary tract. Too much calcium in the urine. Conditions that are passed from parent to child (inherited conditions). Exercise that requires a lot of energy. Infections can usually be treated with medicine, and a kidney stone usually will pass through your urine. If neither of these is the cause of your hematuria, more tests may be needed to identify the cause of your symptoms. It is very important to tell your health care provider about any blood in your urine, even if it is painless or the blood stops without treatment. Blood in the urine, when it happens and then stops and then happens again, can be a symptom of a very serious condition, including cancer. There is no pain in the initial stages of many urinary cancers. Follow these instructions at home: Medicines Take over-the-counter and prescription medicines only as told by your health care provider. If you were prescribed an antibiotic medicine, take it as told by your health care provider. Do not stop taking the antibiotic even if you start to feel better. Eating and drinking Drink enough fluid to keep your urine pale yellow. It is recommended that you drink 3-4 quarts (2.8-3.8 L) a day. If you have been diagnosed with an infection, drinking cranberry juice in addition to large amounts of water is recommended. Avoid caffeine, tea, and carbonated beverages. These tend to irritate the bladder. Avoid alcohol because it may irritate the prostate (in males). General instructions If you have been  diagnosed with a kidney stone, follow your health care provider's instructions about straining your urine to catch the stone. Empty your bladder often. Avoid holding urine for long periods of time. If you are male: After a bowel movement, wipe from front to back and use each piece of toilet paper only once. Empty your bladder before and after sex. Pay attention to any changes in your symptoms. Tell your health care provider about any changes or any new symptoms. It is up to you to get the results of any tests. Ask your health care provider, or the department that is doing the test, when your results will be ready. Keep all follow-up visits. This is important. Contact a health care provider if: You develop back pain. You have a fever or chills. You have nausea or vomiting. Your symptoms do not improve after 3 days. Your symptoms get worse. Get help right away if: You develop severe vomiting and are unable to take medicine without vomiting. You develop severe pain in your back or abdomen even though you are taking medicine. You pass a large amount of blood in your urine. You pass blood clots in your urine. You feel very weak or like you might faint. You faint. Summary Hematuria is blood in the urine. It has many possible causes. It is very important that you tell your health care provider about any blood in your urine, even if it is painless or the blood stops without treatment. Take over-the-counter and prescription medicines only as told by your health care provider. Drink enough fluid to  keep your urine pale yellow. This information is not intended to replace advice given to you by your health care provider. Make sure you discuss any questions you have with your health care provider. Document Revised: 09/16/2019 Document Reviewed: 09/16/2019 Elsevier Patient Education  2024 [employer].

## 2022-08-27 NOTE — Progress Notes (Signed)
  Date of Visit: 08/27/2022   SUBJECTIVE:   HPI:  Brad [name] presents today for a same day appointment to discuss hematuria.  First noticed his urine was pink 2 weeks ago.  It has been intermittently pink/red since then.  Denies having any pain in his abdomen or elsewhere.  No dysuria, fevers, or unintentional weight loss.  Never had this before.  It picked back up a few days ago and has been more consistently red since then.  Does have smoking history though quit in 2016.  Denies being on any blood thinners.  PMHx: hypertension, obesity, prior stroke, ED, OSA, type 2 diabetes, prior NSTEMI  OBJECTIVE:   BP (!) 152/84   Pulse 73   Ht 6\' 9"  (2.057 m)   Wt (!) 330 lb (149.7 kg)   SpO2 96%   BMI 35.36 kg/m  Gen: No acute distress, pleasant, cooperative, well-appearing HEENT: Normocephalic, atraumatic Heart: Regular rate and rhythm, no murmur Lungs: Clear to auscultation bilaterally, normal effort Abdomen: Soft, nontender to palpation, no masses or organomegaly appreciated Neuro: Grossly nonfocal, speech normal Ext: Normal  ASSESSMENT/PLAN:   Health maintenance:  -Obtained routine HIV screening with labs today -Urine microalbumin to creatinine ratio obtained today  Type 2 diabetes mellitus (HCC) Updated A1c today with labs  Gross hematuria UA confirms gross hematuria.  No obvious cause identified from history and exam.  Discussed steps to evaluate with patient, including labs, imaging, and urology referral for likely cystoscopy.  CT abdomen pelvis with and without contrast scheduled for 2 days from now.  Labs ordered today including CBC and CMET.  Has follow-up already scheduled with PCP next week.   [place] J. Pollie Meyer, MD [facility]

## 2022-08-28 ENCOUNTER — Encounter: Payer: Self-pay | Admitting: Family Medicine

## 2022-08-28 ENCOUNTER — Emergency Department (HOSPITAL_COMMUNITY)
Admission: EM | Admit: 2022-08-28 | Discharge: 2022-08-28 | Disposition: A | Discharge status: Home / Self Care | Payer: Medicare Other | Attending: Student | Admitting: Student

## 2022-08-28 ENCOUNTER — Other Ambulatory Visit: Payer: Self-pay

## 2022-08-28 ENCOUNTER — Encounter (HOSPITAL_COMMUNITY): Payer: Self-pay

## 2022-08-28 ENCOUNTER — Emergency Department (HOSPITAL_COMMUNITY): Payer: Medicare Other

## 2022-08-28 DIAGNOSIS — Z79899 Other long term (current) drug therapy: Secondary | ICD-10-CM | POA: Diagnosis not present

## 2022-08-28 DIAGNOSIS — I509 Heart failure, unspecified: Secondary | ICD-10-CM | POA: Insufficient documentation

## 2022-08-28 DIAGNOSIS — Z8673 Personal history of transient ischemic attack (TIA), and cerebral infarction without residual deficits: Secondary | ICD-10-CM | POA: Insufficient documentation

## 2022-08-28 DIAGNOSIS — N3289 Other specified disorders of bladder: Secondary | ICD-10-CM

## 2022-08-28 DIAGNOSIS — I110 Hypertensive heart disease with heart failure: Secondary | ICD-10-CM | POA: Insufficient documentation

## 2022-08-28 DIAGNOSIS — C679 Malignant neoplasm of bladder, unspecified: Secondary | ICD-10-CM | POA: Diagnosis not present

## 2022-08-28 DIAGNOSIS — E119 Type 2 diabetes mellitus without complications: Secondary | ICD-10-CM | POA: Diagnosis not present

## 2022-08-28 DIAGNOSIS — Z87891 Personal history of nicotine dependence: Secondary | ICD-10-CM | POA: Diagnosis not present

## 2022-08-28 DIAGNOSIS — R319 Hematuria, unspecified: Secondary | ICD-10-CM | POA: Insufficient documentation

## 2022-08-28 LAB — URINALYSIS, ROUTINE W REFLEX MICROSCOPIC
Bacteria, UA: NONE SEEN
Bilirubin Urine: NEGATIVE
Glucose, UA: NEGATIVE mg/dL
Ketones, ur: NEGATIVE mg/dL
Nitrite: NEGATIVE
Protein, ur: 100 mg/dL — AB
RBC / HPF: >50 RBC/hpf (ref 0–5)
Specific Gravity, Urine: 1.023 (ref 1.005–1.030)
WBC, UA: >50 WBC/hpf (ref 0–5)
pH: 5 (ref 5.0–8.0)

## 2022-08-28 MED ORDER — IOHEXOL 350 MG/ML SOLN
100.0000 mL | Freq: Once | INTRAVENOUS | Status: AC | PRN
  Administered 2022-08-28: 100 mL via INTRAVENOUS

## 2022-08-28 MED ORDER — ACETAMINOPHEN 500 MG PO TABS
1000.0000 mg | ORAL_TABLET | Freq: Once | ORAL | Status: AC
  Administered 2022-08-28: 1000 mg via ORAL
  Filled 2022-08-28: qty 2

## 2022-08-28 MED ORDER — NAPROXEN 375 MG PO TABS: 375.0000 mg | ORAL_TABLET | Freq: Two times a day (BID) | ORAL | 0 refills | Status: DC

## 2022-08-28 MED ORDER — FLUCONAZOLE 200 MG PO TABS: 200.0000 mg | ORAL_TABLET | Freq: Every day | ORAL | 0 refills | Status: AC

## 2022-08-28 MED ORDER — MOUNJARO 10 MG/0.5ML ~~LOC~~ SOAJ: 12.5000 mg | SUBCUTANEOUS | 1 refills | Status: DC

## 2022-08-28 NOTE — ED Triage Notes (Signed)
Pt reports hematuria for two weeks. Pt also c.o left lower back pain that started today, denies nausea

## 2022-08-28 NOTE — ED Notes (Signed)
Pt had blood work drawn at his PCP yesterday, results are in the chart

## 2022-08-28 NOTE — ED Notes (Signed)
..  The patient is A&OX4, ambulatory at d/c with independent steady gait, NAD. Pt verbalized understanding of d/c instructions. prescriptions and follow up care.

## 2022-08-28 NOTE — ED Provider Notes (Signed)
EMERGENCY DEPARTMENT AT [facility] Provider Note  CSN: [id number] Arrival date & time: 08/28/22 0865  Chief Complaint(s) Hematuria  HPI Brad [name] is a 62 y.o. male with PMH T2DM, tobacco abuse stopped in 2016, previous CVA who presents emergency room for evaluation of hematuria.  Patient states that he has had hematuria for the last 2 weeks and was seen by his primary care physician yesterday who obtained blood work and ordered an outpatient CT scan but patient's wife was very worried about this persistent hematuria and convince the patient to come to the emergency department for further evaluation.  Patient also endorsing some mild back pain that is been present over similar timeframe.  Denies chest pain, shortness of breath, nausea, vomiting, headache, fever or other systemic symptoms.  No weight loss or night sweats.   Past Medical History Past Medical History:  Diagnosis Date   Abnormal ultrasound of carotid artery 04/2014   no significant obstruction   Ataxia 04/2014   Diabetes (HCC)    EKG abnormalities 04/2014   poor R wave progression, NSR   Former smoker    25 years x 1.5 ppd, stopped 04/2014   Gait disturbance 04/2014   s/p stroke   H/O echocardiogram 04/2014   moderate LVH, 60-65% EF, no valve disease   History of heart attack    Hypertensive heart disease 04/2014   Intraparenchymal hemorrhage of brain [facility] 05/05/2014   hospitalization [facility]   Obesity    Prediabetes    Short-term memory loss 04/2014   Sleep apnea    Stroke (HCC) 04/2014   Patient Active Problem List   Diagnosis Date Noted   Vitamin D deficiency 06/10/2020   Type 2 diabetes mellitus (HCC) 10/01/2014   OSA (obstructive sleep apnea) 09/27/2014   Left-sided weakness 09/27/2014   History of stroke 08/13/2014   Former smoker 08/13/2014   Erectile dysfunction 08/13/2014   Obesity 06/20/2014   Essential hypertension 05/20/2014   NSTEMI (non-ST elevated  myocardial infarction) (HCC) 05/08/2014   Home Medication(s) Prior to Admission medications   Medication Sig Start Date End Date Taking? Authorizing Provider  fluconazole (DIFLUCAN) 200 MG tablet Take 1 tablet (200 mg total) by mouth daily for 7 days. 08/28/22 09/04/22 Yes , , MD  naproxen (NAPROSYN) 375 MG tablet Take 1 tablet (375 mg total) by mouth 2 (two) times daily. 08/28/22  Yes , , MD  amLODipine (NORVASC) 10 MG tablet Take 1 tablet (10 mg total) by mouth daily. 12/15/20   Helane Rima, DO  blood glucose meter kit and supplies KIT Dispense based on patient and insurance preference. Check fasting blood sugar once daily ICD10 R73.03 10/11/16   Salley Scarlet, MD  chlorthalidone (HYGROTON) 25 MG tablet Take 1 tablet (25 mg total) by mouth daily. 04/18/21   Helane Rima, DO  glucose blood test strip Use as instructed to monitor FSBS 1x daily. Dx: R73.09 06/07/17   Salley Scarlet, MD  labetalol (NORMODYNE) 200 MG tablet TAKE 1 TABLET(200 MG) BY MOUTH TWICE DAILY 04/18/21   Helane Rima, DO  Lancets [facility] ULTRASOFT) lancets Use as instructed 10/11/16   Salley Scarlet, MD  losartan (COZAAR) 100 MG tablet Take 1 tablet (100 mg total) by mouth daily. 12/15/20   Helane Rima, DO  MOUNJARO 10 MG/0.5ML Pen Inject 12.5 mg into the skin once a week. 08/28/22   , , MD  potassium chloride (KLOR-CON) 10 MEQ tablet Take 10 mEq by mouth daily. 09/13/21  [provider]  tadalafil (CIALIS) 20 MG tablet TAKE 1 TABLET(20 MG) BY MOUTH DAILY AS NEEDED 03/01/21   Helane Rima, DO  Vitamin D, Ergocalciferol, (DRISDOL) 1.25 MG (50000 UNIT) CAPS capsule Take 1 capsule (50,000 Units total) by mouth every 7 (seven) days. 06/12/20   Katha Cabal, DO                                                                                                                                    Past Surgical History Past Surgical History:  Procedure Laterality Date    APPENDECTOMY     CHOLECYSTECTOMY     CIRCUMCISION     HEMORRHOID SURGERY     Family History Family History  Problem Relation Age of Onset   Heart disease Mother    Hypertension Mother    Sudden death Mother    Hypertension Brother    Aneurysm Paternal Grandmother     Social History Social History   Tobacco Use   Smoking status: Former    Current packs/day: 0.00    Average packs/day: 1.5 packs/day for 25.0 years (37.5 ttl pk-yrs)    Types: Cigarettes    Start date: 05/04/1989    Quit date: 05/05/2014    Years since quitting: 8.3   Smokeless tobacco: Never  Vaping Use   Vaping status: Never Used  Substance Use Topics   Alcohol use: No    Alcohol/week: 0.0 standard drinks of alcohol   Drug use: No   Allergies Patient has no known allergies.  Review of Systems Review of Systems  Genitourinary:  Positive for hematuria.  Musculoskeletal:  Positive for back pain.    Physical Exam Vital Signs  I have reviewed the triage vital signs BP (!) 136/97   Pulse 70   Temp 98.3 F (36.8 C) (Oral)   Resp 17   Ht 6\' 9"  (2.057 m)   Wt (!) 149.7 kg   SpO2 100%   BMI 35.36 kg/m   Physical Exam Vitals and nursing note reviewed.  Constitutional:      General: He is not in acute distress.    Appearance: He is well-developed.  HENT:     Head: Normocephalic and atraumatic.  Eyes:     Conjunctiva/sclera: Conjunctivae normal.  Cardiovascular:     Rate and Rhythm: Normal rate and regular rhythm.     Heart sounds: No murmur heard. Pulmonary:     Effort: Pulmonary effort is normal. No respiratory distress.     Breath sounds: Normal breath sounds.  Abdominal:     Palpations: Abdomen is soft.     Tenderness: There is no abdominal tenderness.  Musculoskeletal:        General: No swelling.     Cervical back: Neck supple.  Skin:    General: Skin is warm and dry.     Capillary Refill: Capillary refill takes less than 2 seconds.  Neurological:  Mental Status: He is alert.   Psychiatric:        Mood and Affect: Mood normal.     ED Results and Treatments Labs (all labs ordered are listed, but only abnormal results are displayed) Labs Reviewed  URINALYSIS, ROUTINE W REFLEX MICROSCOPIC - Abnormal; Notable for the following components:      Result Value   Color, Urine AMBER (*)    APPearance CLOUDY (*)    Hgb urine dipstick LARGE (*)    Protein, ur 100 (*)    Leukocytes,Ua TRACE (*)    All other components within normal limits                                                                                                                          Radiology CT ABDOMEN PELVIS W WO CONTRAST  Result Date: 08/28/2022 CLINICAL DATA:  Microscopic hematuria, left lower back pain for 2 weeks * Tracking Code: BO * EXAM: CT ABDOMEN AND PELVIS WITHOUT AND WITH CONTRAST TECHNIQUE: Multidetector CT imaging of the abdomen and pelvis was performed following the standard protocol before and following the bolus administration of intravenous contrast. RADIATION DOSE REDUCTION: This exam was performed according to the departmental dose-optimization program which includes automated exposure control, adjustment of the mA and/or kV according to patient size and/or use of iterative reconstruction technique. CONTRAST:  OMNIPAQUE IOHEXOL 350 MG/ML SOLN COMPARISON:  None Available. FINDINGS: Lower chest: No acute abnormality. Hepatobiliary: No solid liver abnormality is seen. Small simple benign liver cysts, requiring no specific further follow-up or characterization. No gallstones, gallbladder wall thickening, or biliary dilatation. Pancreas: Unremarkable. No pancreatic ductal dilatation or surrounding inflammatory changes. Spleen: Normal in size without significant abnormality. Adrenals/Urinary Tract: Adrenal glands are unremarkable. 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 No calculi or hydronephrosis. Contrast enhancing, lobulated endoluminal mass of the anterior bladder dome, measuring 2.6 x 2.0 x 1.2 cm (series 8, image 79, series 12, image 96). Prominent varices about the bladder dome and anterior bladder (series 8, image 83, series 12, image 96). No other filling defect on delayed phase imaging. Stomach/Bowel: Stomach is within normal limits. Appendix not clearly visualized and may be surgically absent. No evidence of bowel wall thickening, distention, or inflammatory changes. Vascular/Lymphatic: Aortic atherosclerosis. No enlarged abdominal or pelvic lymph nodes. Reproductive: Prostatomegaly. Other: No abdominal wall hernia or abnormality. No ascites. Musculoskeletal: No acute or significant osseous findings. IMPRESSION: 1. Contrast enhancing, lobulated endoluminal mass of the anterior bladder dome, measuring 2.6 x 2.0 x 1.2 cm, consistent with primary bladder malignancy. 2. Prominent varices about the bladder dome and anterior bladder, without overt evidence of extramural  tumor extent. 3. No evidence of lymphadenopathy or metastatic disease in the abdomen or pelvis. 4. Prostatomegaly. Aortic Atherosclerosis (ICD10-I70.0). Electronically Signed   By: Jearld Lesch M.D.   On: 08/28/2022 12:56    Pertinent labs & imaging results that were available during my care of the patient were reviewed by me and considered in my medical decision making (see MDM for details).  Medications Ordered in ED Medications  acetaminophen (TYLENOL) tablet 1,000 mg (1,000 mg Oral Given 08/28/22 1543)  iohexol (OMNIPAQUE) 350 MG/ML injection 100 mL (100 mLs Intravenous Contrast Given 08/28/22 1243)                                                                                                                                      Procedures Procedures  (including critical care time)  Medical Decision Making / ED Course   This patient presents to the ED for concern of hematuria, this involves an extensive number of treatment options, and is a complaint that carries with it a high risk of complications and morbidity.  The differential diagnosis includes bladder mass, nephrolithiasis, UTI, prostatitis, STI  MDM: Patient seen emergency room for evaluation of hematuria.  Physical exam largely unremarkable.  Laboratory evaluation was deferred as this was performed yesterday but was overall unremarkable.  Urinalysis with trace leuk esterase, greater than 50 red blood cells, greater than 50 Done blood cells but no bacteria seen.  There are budding yeast seen and we will cover with outpatient fluconazole.  CT is concerning for a lobulated endoluminal mass at the anterior bladder dome concerning for primary bladder malignancy with no evidence of metastatic disease.  I spoke with the urologist on-call Dr. Alvester Morin to help facilitate patient's outpatient follow-up and he agreed to help on this front.  At this time, patient does not meet inpatient criteria for admission but will need to follow-up closely outpatient with urology to discuss further treatment for his primary bladder malignancy.  Patient then discharged on fluconazole and Naprosyn for pain.  Additional history obtained: -Additional history obtained from wife -External records from outside source obtained and reviewed including: Chart review including previous notes, labs, imaging, consultation notes   Lab Tests: -I ordered, reviewed, and interpreted labs.   The pertinent results include:   Labs Reviewed  URINALYSIS, ROUTINE W REFLEX MICROSCOPIC - Abnormal; Notable for the following components:      Result Value   Color, Urine AMBER (*)    APPearance  CLOUDY (*)    Hgb urine dipstick LARGE (*)    Protein, ur 100 (*)    Leukocytes,Ua TRACE (*)    All other components within normal limits       Imaging Studies ordered: I ordered imaging studies including CT abdomen pelvis I independently visualized and interpreted imaging. I agree with the radiologist interpretation   Medicines ordered and prescription drug management: Meds ordered this encounter  Medications   acetaminophen (  TYLENOL) tablet 1,000 mg   iohexol (OMNIPAQUE) 350 MG/ML injection 100 mL   naproxen (NAPROSYN) 375 MG tablet    Sig: Take 1 tablet (375 mg total) by mouth 2 (two) times daily.    Dispense:  20 tablet    Refill:  0   MOUNJARO 10 MG/0.5ML Pen    Sig: Inject 12.5 mg into the skin once a week.    Dispense:  2 mL    Refill:  1   fluconazole (DIFLUCAN) 200 MG tablet    Sig: Take 1 tablet (200 mg total) by mouth daily for 7 days.    Dispense:  7 tablet    Refill:  0    -I have reviewed the patients home medicines and have made adjustments as needed  Critical interventions none  Consultations Obtained: I requested consultation with the urologist on-call Dr. Alvester Morin,  and discussed lab and imaging findings as well as pertinent plan - they recommend: Outpatient follow-up   Cardiac Monitoring: The patient was maintained on a cardiac monitor.  I personally viewed and interpreted the cardiac monitored which showed an underlying rhythm of: NSR  Social Determinants of Health:  Factors impacting patients care include: Patient requesting refills on Mounjaro which I attempted to do from the ER today   Reevaluation: After the interventions noted above, I reevaluated the patient and found that they have :stayed the same  Co morbidities that complicate the patient evaluation  Past Medical History:  Diagnosis Date   Abnormal ultrasound of carotid artery 04/2014   no significant obstruction   Ataxia 04/2014   Diabetes (HCC)    EKG abnormalities 04/2014    poor R wave progression, NSR   Former smoker    25 years x 1.5 ppd, stopped 04/2014   Gait disturbance 04/2014   s/p stroke   H/O echocardiogram 04/2014   moderate LVH, 60-65% EF, no valve disease   History of heart attack    Hypertensive heart disease 04/2014   Intraparenchymal hemorrhage of brain [facility] 05/05/2014   hospitalization [facility]   Obesity    Prediabetes    Short-term memory loss 04/2014   Sleep apnea    Stroke (HCC) 04/2014      Dispostion: I considered admission for this patient, but at this time he does not meet inpatient criteria for admission and he is safe for discharge with outpatient follow-up     Final Clinical Impression(s) / ED Diagnoses Final diagnoses:  Bladder mass  Hematuria, unspecified type     @PCDICTATION @    Glendora Score, MD 08/28/22 1858

## 2022-08-28 NOTE — ED Notes (Signed)
Patient transported to CT 

## 2022-08-29 ENCOUNTER — Ambulatory Visit (HOSPITAL_COMMUNITY): Payer: Medicare Other

## 2022-08-29 DIAGNOSIS — R310 Gross hematuria: Secondary | ICD-10-CM | POA: Insufficient documentation

## 2022-08-29 NOTE — Assessment & Plan Note (Addendum)
UA confirms gross hematuria.  No obvious cause identified from history and exam.  Discussed steps to evaluate with patient, including labs, imaging, and urology referral for likely cystoscopy.  CT abdomen pelvis with and without contrast scheduled for 2 days from now.  Labs ordered today including CBC and CMET.  Has follow-up already scheduled with PCP next week.

## 2022-08-29 NOTE — Assessment & Plan Note (Signed)
Updated A1c today with labs

## 2022-09-05 NOTE — Telephone Encounter (Signed)
[name] RN called over to [facility] and patient has appointment for tomorrow, fortunately.  Latrelle Dodrill, MD

## 2022-09-06 ENCOUNTER — Ambulatory Visit: Payer: Medicare Other | Admitting: Student

## 2022-09-10 ENCOUNTER — Telehealth: Payer: Self-pay

## 2022-09-10 NOTE — Telephone Encounter (Signed)
Patient calls nurse line regarding pre op clearance.   Per mychart message from Dr. Pollie Meyer, patient needs to schedule appointment. Appointment scheduled for tomorrow for pre operative clearance at 10:10.   Called [facility]. I was transferred to scheduling coordinator, she did not answer, LVM asking that paperwork be faxed to our office. I also left our call back number if they need to speak with [name] regarding upcoming procedure.   Veronda Prude, RN

## 2022-09-11 ENCOUNTER — Telehealth: Payer: Self-pay

## 2022-09-11 ENCOUNTER — Ambulatory Visit (HOSPITAL_COMMUNITY)
Admission: RE | Admit: 2022-09-11 | Discharge: 2022-09-11 | Disposition: A | Discharge status: Home / Self Care | Payer: Medicare Other | Source: Ambulatory Visit | Attending: Family Medicine | Admitting: Family Medicine

## 2022-09-11 ENCOUNTER — Ambulatory Visit: Payer: Medicare Other | Admitting: Student

## 2022-09-11 VITALS — BP 122/70 | HR 74 | Ht >= 80 in | Wt 330.0 lb

## 2022-09-11 DIAGNOSIS — C679 Malignant neoplasm of bladder, unspecified: Secondary | ICD-10-CM | POA: Insufficient documentation

## 2022-09-11 DIAGNOSIS — Z01818 Encounter for other preprocedural examination: Secondary | ICD-10-CM | POA: Diagnosis present

## 2022-09-11 DIAGNOSIS — Z01810 Encounter for preprocedural cardiovascular examination: Secondary | ICD-10-CM | POA: Insufficient documentation

## 2022-09-11 DIAGNOSIS — I252 Old myocardial infarction: Secondary | ICD-10-CM | POA: Diagnosis present

## 2022-09-11 DIAGNOSIS — I214 Non-ST elevation (NSTEMI) myocardial infarction: Secondary | ICD-10-CM

## 2022-09-11 DIAGNOSIS — D494 Neoplasm of unspecified behavior of bladder: Secondary | ICD-10-CM | POA: Diagnosis not present

## 2022-09-11 NOTE — Telephone Encounter (Signed)
   Name: Caylen Heiny  DOB: 01-May-1960  MRN: [id number]  Primary Cardiologist: Kristeen Miss, MD  Chart reviewed as part of pre-operative protocol coverage. Because of Rhyan Spurgin's past medical history and time since last visit, he will require a follow-up telephone visit in order to better assess preoperative cardiovascular risk.  Pre-op covering staff: - Please schedule appointment and call patient to inform them. If patient already had an upcoming appointment within acceptable timeframe, please add "pre-op clearance" to the appointment notes so provider is aware. - Please contact requesting surgeon's office via preferred method (i.e, phone, fax) to inform them of need for appointment prior to surgery.  Jonita Albee, PA-C  09/11/2022, 1:04 PM

## 2022-09-11 NOTE — Assessment & Plan Note (Signed)
Undergoing cystoscopy transurethral resection of bladder tumor with gemcitabine.  Here for preop restratification.  See below.

## 2022-09-11 NOTE — Telephone Encounter (Signed)
Surgical Clearance faxed to [facility].   Copy is at my desk.

## 2022-09-11 NOTE — Telephone Encounter (Signed)
Pt has appt in office with Jacolyn Reedy, [facility] 09/25/22. Procedure is TBD. Will add need pre op clearance to appt notes. I will update all parties involved.

## 2022-09-11 NOTE — Patient Instructions (Addendum)
It was great to see you! Thank you for allowing me to participate in your care!   I recommend that you always bring your medications to each appointment as this makes it easy to ensure we are on the correct medications and helps [name] not miss when refills are needed.  Our plans for today:  -Do not take hydrochlorothiazide, losartan, tadalafil and potassium on day of surgery. -Do not take Mounjaro 7 days before surgery, you can resume Mounjaro after surgery -You can take your other medication as prescribed. -Please bring your CPAP machine, if they plan to keep you overnight -We will fax copy of this visit note to your urologist    Take care and seek immediate care sooner if you develop any concerns. Please remember to show up 15 minutes before your scheduled appointment time!  Tiffany Kocher, DO [facility]

## 2022-09-11 NOTE — Telephone Encounter (Signed)
..     Pre-operative Risk Assessment    Patient Name: Brad [name]  DOB: [birth year] MRN: [id number]      Request for Surgical Clearance    Procedure:   CYSTO, TRANSURETHRAL RESECTION OF BLADDER TUMOR WITH GERNATABINE  Date of Surgery:  Clearance TBD                                 Surgeon:  DR Rhoderick Moody Surgeon's Group or Practice Name:  [facility] Phone number:  [phone] Fax number:  [phone]   Type of Clearance Requested:   - Medical    Type of Anesthesia:  Not Indicated   Additional requests/questions:    Jola Babinski   09/11/2022, 12:47 PM

## 2022-09-11 NOTE — Progress Notes (Addendum)
    SUBJECTIVE:   CHIEF COMPLAINT / HPI: Preoperative risk evaluation  Procedure: Cystoscopy, transurethral resection of bladder tumor with gemcitabine  Procedural risk: Intermediate  Anesthesia: General  PMH: -NSTEMI - Hypertension - OSA - T2 diabetes  Surgical History:  The patient denies any complication with anesthesia, bleeding, or post-operative confusion, nausea, or vomiting in prior surgical interventions. The patient denies any history of spinal surgeries.  Family History: The patient denies any family history of complications with anesthesia and VTE.  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  Personally reviewed by me and compared with last EKG from 01/15/2022.  Normal sinus and rhythm, no signs of infarction nor other concerning pathology.   OBJECTIVE:   BP 122/70   Pulse 74   Ht 6\' 9"  (2.057 m)   Wt (!) 330 lb (149.7 kg)   SpO2 98%   BMI 35.36 kg/m    General: NAD, pleasant Cardio: RRR, no MRG. Cap Refill <2s. Respiratory: CTAB, normal wob on RA Skin: Warm and dry  ASSESSMENT/PLAN:   Bladder tumor Undergoing cystoscopy transurethral resection of bladder tumor with gemcitabine.  Here for preop restratification.  See below.   Preoperative risk and recommendations: The patient is at low risk of complications from an intermediate risk surgery.  I recommend the following additional tests: No further testing required.  Defer repeat UA to urology's discretion. I recommend the following changes to medications in the perioperative setting: Hold hydrochlorothiazide, losartan, tadalafil, and potassium day of surgery.  Hold Mounjaro 7 days before surgery, can resume after surgery.  These instructions were given to patient. Recommend patient  bring CPAP machine to hospital if there is plan to hold patient overnight  Tiffany Kocher, DO [facility]

## 2022-09-11 NOTE — Progress Notes (Deleted)
    SUBJECTIVE:   CHIEF COMPLAINT / HPI: Preoperative risk evaluation  Procedure: Cystoscopy, transurethral resection of bladder tumor with gemcitabine  Procedural risk: Intermediate  Anesthesia: General  PMH: -NSTEMI - Hypertension - OSA - T2 diabetes  Surgical History:  The patient denies any complication with anesthesia, bleeding, or post-operative confusion, nausea, or vomiting in prior surgical interventions. The patient denies any history of spinal surgeries.  Family History: The patient denies any family history of complications with anesthesia and VTE.  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  Personally reviewed by me and compared with last EKG from 01/15/2022.  Normal sinus and rhythm, no signs of infarction nor other concerning pathology.   OBJECTIVE:   BP 122/70   Pulse 74   Ht 6\' 9"  (2.057 m)   Wt (!) 330 lb (149.7 kg)   SpO2 98%   BMI 35.36 kg/m    General: NAD, pleasant Cardio: RRR, no MRG. Cap Refill <2s. Respiratory: CTAB, normal wob on RA Skin: Warm and dry  ASSESSMENT/PLAN:   Bladder tumor Undergoing cystoscopy transurethral resection of bladder tumor with gemcitabine.  Here for preop restratification.  See below.   Preoperative risk and recommendations: The patient is at low risk of complications from an intermediate risk surgery.  I recommend the following additional tests: No further testing required.  Defer repeat UA to urology's discretion. I recommend the following changes to medications in the perioperative setting: Hold hydrochlorothiazide, losartan, tadalafil, and potassium day of surgery.  Hold Mounjaro 7 days before surgery, can resume after surgery.  These instructions were given to patient. Recommend patient  bring CPAP machine to hospital if there is plan to hold patient overnight  Tiffany Kocher, DO [facility]

## 2022-09-13 ENCOUNTER — Other Ambulatory Visit: Payer: Self-pay | Admitting: Urology

## 2022-09-13 NOTE — Progress Notes (Deleted)
Cardiology Office Note:  .   Date:  09/13/2022  ID:  Brad [name], DOB 1960/05/14, MRN [id number] PCP: Levin Erp, MD  [facility] Providers Cardiologist:  Kristeen Miss, MD { Click to update primary MD,subspecialty MD or APP then REFRESH:1}   History of Present Illness: .   Brad [name] is a 62 y.o. male with history of malignant HTN, intracranial hemmorhage and NSTEMI due to HTN, obesity.l  Patient here for cardiac clearance for  CYSTO, TRANSURETHRAL RESECTION OF BLADDER TUMOR WITH GERNATABINE  ROS: ***  Studies Reviewed: Marland Kitchen         Prior CV Studies: {Select studies to display:26339}  NST 2016 EKG shows nonspecific T wave abnormalities at rest and with stress. No chest pain with lexiscan stress. There is diaphragmatic attenuation. There are no perfusion defects to suggest ischemia. The LV is slightly dilated. EF 49% without segmental wall motion abnormalities.  Risk Assessment/Calculations:   {Does this patient have ATRIAL FIBRILLATION?[phone]} No BP recorded.  {Refresh Note OR Click here to enter BP  :1}***       Physical Exam:   VS:  There were no vitals taken for this visit.   Wt Readings from Last 3 Encounters:  09/11/22 (!) 330 lb (149.7 kg)  08/28/22 (!) 330 lb (149.7 kg)  08/27/22 (!) 330 lb (149.7 kg)    GEN: Well nourished, well developed in no acute distress NECK: No JVD; No carotid bruits CARDIAC: ***RRR, no murmurs, rubs, gallops RESPIRATORY:  Clear to auscultation without rales, wheezing or rhonchi  ABDOMEN: Soft, non-tender, non-distended EXTREMITIES:  No edema; No deformity   ASSESSMENT AND PLAN: .   Preop clearance for  CYSTO, TRANSURETHRAL RESECTION OF BLADDER TUMOR WITH GERNATABINE by Dr. Rhoderick Moody.  Malignant HTN   NSTEMI in setting of malignant HTN, normal LVEF on echo.  History of intracranial hemorrhage 2016 due to malignant HTN [facility]  obesity     {Are you ordering a CV Procedure (e.g. stress test,  cath, DCCV, TEE, etc)?   Press F2        :[id number]}  Dispo: ***  Signed, Jacolyn Reedy, PA-C

## 2022-09-14 ENCOUNTER — Encounter (HOSPITAL_COMMUNITY): Payer: Self-pay

## 2022-09-14 NOTE — Patient Instructions (Addendum)
SURGICAL WAITING ROOM VISITATION  Patients having surgery or a procedure may have no more than 2 support people in the waiting area - these visitors may rotate.    Children under the age of 64 must have an adult with them who is not the patient.   If the patient needs to stay at the hospital during part of their recovery, the visitor guidelines for inpatient rooms apply. Pre-op nurse will coordinate an appropriate time for 1 support person to accompany patient in pre-op.  This support person may not rotate.    Please refer to the [facility] website for the visitor guidelines for Inpatients (after your surgery is over and you are in a regular room).       Your procedure is scheduled on: 09-26-22   Report to [facility] Main Entrance    Report to admitting at       8:15  AM   Call this number if you have problems the morning of surgery [phone]   Do not eat food or drink liquids  :After Midnight.            If you have questions, please contact your surgeon's office.   FOLLOW ANY ADDITIONAL PRE OP INSTRUCTIONS YOU RECEIVED FROM YOUR SURGEON'S OFFICE!!!     Oral Hygiene is also important to reduce your risk of infection.                                    Remember - BRUSH YOUR TEETH THE MORNING OF SURGERY WITH YOUR REGULAR TOOTHPASTE  DENTURES WILL BE REMOVED PRIOR TO SURGERY PLEASE DO NOT APPLY "Poly grip" OR ADHESIVES!!!   Do NOT smoke after Midnight   Stop all vitamins and herbal supplements 7 days before surgery.   Take these medicines the morning of surgery with A SIP OF WATER:l Labetalol Amlodipine                STOP MOUNJARO 7 DAYS PRIOR TO SURGERY   DO NOT TAKE ANY ORAL DIABETIC MEDICATIONS DAY OF YOUR SURGERY  Bring CPAP mask and tubing day of surgery.                              You may not have any metal on your body including  jewelry, and body piercing             Do not wear  lotions, powders, cologne, or deodorant              Men may  shave face and neck.   Do not bring valuables to the hospital. [facility] IS NOT RESPONSIBLE   FOR VALUABLES.   Contacts, glasses, dentures or bridgework may not be worn into surgery.  DO NOT BRING YOUR HOME MEDICATIONS TO THE HOSPITAL. PHARMACY WILL DISPENSE MEDICATIONS LISTED ON YOUR MEDICATION LIST TO YOU DURING YOUR ADMISSION IN THE HOSPITAL!    Patients discharged on the day of surgery will not be allowed to drive home.  Someone NEEDS to stay with you for the first 24 hours after anesthesia.   Special Instructions: Bring a copy of your healthcare power of attorney and living will documents the day of surgery if you haven't scanned them before.              Please read over the following fact sheets you were given:  IF YOU HAVE QUESTIONS ABOUT YOUR PRE-OP INSTRUCTIONS PLEASE CALL [phone] [facility]   If you received a COVID test during your pre-op visit  it is requested that you wear a mask when out in public, stay away from anyone that may not be feeling well and notify your surgeon if you develop symptoms. If you test positive for Covid or have been in contact with anyone that has tested positive in the last 10 days please notify you surgeon.    [facility] - Preparing for Surgery Before surgery, you can play an important role.  Because skin is not sterile, your skin needs to be as free of germs as possible.  You can reduce the number of germs on your skin by washing with CHG (chlorahexidine gluconate) soap before surgery.  CHG is an antiseptic cleaner which kills germs and bonds with the skin to continue killing germs even after washing. Please DO NOT use if you have an allergy to CHG or antibacterial soaps.  If your skin becomes reddened/irritated stop using the CHG and inform your nurse when you arrive at Short Stay. Do not shave (including legs and underarms) for at least 48 hours prior to the first CHG shower.  You may shave your face/neck. Please follow these instructions  carefully:  1.  Shower with CHG Soap the night before surgery and the  morning of Surgery.  2.  If you choose to wash your hair, wash your hair first as usual with your  normal  shampoo.  3.  After you shampoo, rinse your hair and body thoroughly to remove the  shampoo.                           4.  Use CHG as you would any other liquid soap.  You can apply chg directly  to the skin and wash                       Gently with a scrungie or clean washcloth.  5.  Apply the CHG Soap to your body ONLY FROM THE NECK DOWN.   Do not use on face/ open                           Wound or open sores. Avoid contact with eyes, ears mouth and genitals (private parts).                       Wash face,  Genitals (private parts) with your normal soap.             6.  Wash thoroughly, paying special attention to the area where your surgery  will be performed.  7.  Thoroughly rinse your body with warm water from the neck down.  8.  DO NOT shower/wash with your normal soap after using and rinsing off  the CHG Soap.                9.  Pat yourself dry with a clean towel.            10.  Wear clean pajamas.            11.  Place clean sheets on your bed the night of your first shower and do not  sleep with pets. Day of Surgery : Do not apply any lotions/deodorants the morning of surgery.  Please wear clean  clothes to the hospital/surgery center.  FAILURE TO FOLLOW THESE INSTRUCTIONS MAY RESULT IN THE CANCELLATION OF YOUR SURGERY PATIENT SIGNATURE_________________________________  NURSE SIGNATURE__________________________________  ________________________________________________________________________

## 2022-09-14 NOTE — Telephone Encounter (Signed)
I will update the requesting office the pt called and moved his appt out to 11/30/22. Procedure will need to be post poned until the pt has been cleared by cardiology.

## 2022-09-14 NOTE — Progress Notes (Addendum)
PCP - 09-11-22 Rhae Hammock ,DO clearance Cardiologist - Laqueta Carina LOV 01-15-22 epic  PPM/ICD -  Device Orders -  Rep Notified -   Chest x-ray -  EKG - 09-11-22 epic Stress Test - 2016 ECHO -  Cardiac Cath -  HgbA1c- 08-27-22 epic 6.0 CBC,CMP 08-27-22 epic  Sleep Study -  CPAP -   Fasting Blood Sugar -  Checks Blood Sugar _____ times a day  Blood Thinner Instructions: Aspirin Instructions:  ERAS Protcol - PRE-SURGERY Ensure or G2-   COVID TEST-  COVID vaccine -  Activity-- Anesthesia review: DM, CVA 2016, OSA, HTN  Patient denies shortness of breath, fever, cough and chest pain at PAT appointment   All instructions explained to the patient, with a verbal understanding of the material. Patient agrees to go over the instructions while at home for a better understanding. Patient also instructed to self quarantine after being tested for COVID-19. The opportunity to ask questions was provided.

## 2022-09-18 ENCOUNTER — Ambulatory Visit (INDEPENDENT_AMBULATORY_CARE_PROVIDER_SITE_OTHER): Payer: Medicare Other | Admitting: Student

## 2022-09-18 ENCOUNTER — Encounter: Payer: Self-pay | Admitting: Student

## 2022-09-18 VITALS — BP 127/65 | HR 68 | Ht >= 80 in | Wt 335.1 lb

## 2022-09-18 DIAGNOSIS — D494 Neoplasm of unspecified behavior of bladder: Secondary | ICD-10-CM | POA: Diagnosis not present

## 2022-09-18 DIAGNOSIS — I10 Essential (primary) hypertension: Secondary | ICD-10-CM | POA: Diagnosis not present

## 2022-09-18 DIAGNOSIS — E119 Type 2 diabetes mellitus without complications: Secondary | ICD-10-CM

## 2022-09-18 NOTE — Progress Notes (Signed)
COVID Vaccine Completed:  Yes  Date of COVID positive in last 90 days:  PCP - Levin Erp, MD Cardiologist - Kristeen Miss, MD Neurologist - Huston Foley, MD  Clearance appt scheduled for 09-25-22  Chest x-ray -  EKG - 09-11-22 Epic Stress Test - 06-24-14 Epic ECHO - 05-06-14 [facility] Cardiac Cath -  Pacemaker/ICD device last checked: Spinal Cord Stimulator:  Bowel Prep -   Sleep Study - Yes, +sleep apnea CPAP -   Fasting Blood Sugar -  Checks Blood Sugar _____ times a day  Mounjaro Last dose of GLP1 agonist-   GLP1 instructions:  do not take after 09-18-22   Last dose of SGLT-2 inhibitors-  N/A SGLT-2 instructions: N/A   Blood Thinner Instructions:  Time Aspirin Instructions: Last Dose:  Activity level:  Can go up a flight of stairs and perform activities of daily living without stopping and without symptoms of chest pain or shortness of breath.  Able to exercise without symptoms  Unable to go up a flight of stairs without symptoms of     Anesthesia review:  Hx of MI, HTN, OSA, DM, hx of stroke, malignant HTN, hx of intracranial hemorrhage  Patient denies shortness of breath, fever, cough and chest pain at PAT appointment  Patient verbalized understanding of instructions that were given to them at the PAT appointment. Patient was also instructed that they will need to review over the PAT instructions again at home before surgery.

## 2022-09-18 NOTE — Progress Notes (Signed)
  Cardiology Office Note:  .   Date:  09/25/2022  ID:  Brad [name], DOB 16-Nov-1960, MRN [id number] PCP: Levin Erp, MD  [facility] Providers Cardiologist:  Kristeen Miss, MD    History of Present Illness: .   Brad [name] is a 62 y.o. male  with history of malignant HTN, intracranial hemmorhage and NSTEMI due to HTN, obesity.  Patient here for cardiac clearance for  CYSTO, TRANSURETHRAL RESECTION OF BLADDER TUMOR WITH GERNATABINE. BP has been controlled at home. No chest pain, dyspnea, palpitations, edema. Walks on treadmill, elliptical, crunches, weights 90 min 5 days a week.  ROS:    Studies Reviewed: Marland Kitchen         Prior CV Studies:   NST 06/24/22 EKG shows nonspecific T wave abnormalities at rest and with stress. No chest pain with lexiscan stress. There is diaphragmatic attenuation. There are no perfusion defects to suggest ischemia. The LV is slightly dilated. EF 49% without segmental wall motion abnormalities.  Risk Assessment/Calculations:             Physical Exam:   VS:  BP 130/80   Pulse 85   Ht 6\' 9"  (2.057 m)   Wt (!) 333 lb 3.2 oz (151.1 kg)   SpO2 98%   BMI 35.71 kg/m    Wt Readings from Last 3 Encounters:  09/25/22 (!) 333 lb 3.2 oz (151.1 kg)  09/24/22 (!) 334 lb (151.5 kg)  09/19/22 (!) 333 lb 6.4 oz (151.2 kg)    GEN: Well nourished, well developed in no acute distress NECK: No JVD; No carotid bruits CARDIAC:  RRR, no murmurs, rubs, gallops RESPIRATORY:  Clear to auscultation without rales, wheezing or rhonchi  ABDOMEN: Soft, non-tender, non-distended EXTREMITIES:  No edema; No deformity   ASSESSMENT AND PLAN: .     Preop clearance for cysto/TURP tomorrow Dr. Rhoderick Moody. Patient with history of MI due to HTN 2016. No symptoms and doing well. He is acceptable risk to proceed with above surgery based on RCRI and METs. According to the Revised Cardiac Risk Index (RCRI), his Perioperative Risk of Major Cardiac Event is (%): 0.9  His  Functional Capacity in METs is: 8.23 according to the [facility] Activity Status Index (DASI).   Malignant HTN-BP very well controlled  CAD history of NSTEMI due to HTN now well controlled  History of intracranial hemorrhage due to HTN  Obesity-exercises daily        Dispo: f/u with Dr. Elease Hashimoto in 1 yr.  Signed, Jacolyn Reedy, PA-C

## 2022-09-18 NOTE — Assessment & Plan Note (Signed)
A1c controlled today. Urine micro albumin/creatinine elevated-discussed he is on renal protective meds. -continue ARB -continue mounjaro 12.5 weekly

## 2022-09-18 NOTE — Patient Instructions (Signed)
It was great to see you! Thank you for allowing me to participate in your care!   Our plans for today:  - You are doing amazing, I will keep you in my prayers! - Continue your meds as you have been taking them and truck through these appointments!  Take care and seek immediate care sooner if you develop any concerns.  Levin Erp, MD

## 2022-09-18 NOTE — Assessment & Plan Note (Signed)
He is preparing for surgery upcoming on 28th. Coping with this news through his faith. Patient requested I be a part of prayer in office today which I accepted. Therapeutic hug given.  -Reassess after surgery performed

## 2022-09-18 NOTE — Assessment & Plan Note (Signed)
Patient's blood pressure is controlled today. BP: 127/65. Goal of 140/90. Patient's medication regimen includes amlodipine 10, chlorthalidone 25 mg daily, losartan 100 mg daily, labetalol 200 mg BID. States he is urinating a lot from chlorthalidone but I wonder whether this is related to bladder tumor. Will reassess after surgery but given controlled right now will continue medications as they are. -Continue HTN meds -Follow up after surgery

## 2022-09-18 NOTE — Progress Notes (Signed)
    SUBJECTIVE:   CHIEF COMPLAINT / HPI:   Bladder tumor Planned cystoscopy and transurethral resection of bladder tumor with gemcitabine on 09/26/2022.  He got clearance from our family medicine center 8/13.  He is awaiting cardiology presurgical clearance as well given his history scheduled visit on 8/27. He is coping well with this news and dealing with this well.   Diabetic Follow Up: Patient is a 62 y.o. male who present today for diabetic follow up.   Patient endorses no problems except sometimes can't get mounjaro 12.5 and has to fill 10  Home medications include: mounjaro 12.5  ACEi/ARB: yes Statin: yes Patient endorses taking these medications as prescribed.  Most recent A1Cs:  Lab Results  Component Value Date   HGBA1C 6.0 08/27/2022   HGBA1C 5.8 09/06/2021   HGBA1C 6.2 11/02/2020   Last Microalbumin, LDL, Creatinine: Lab Results  Component Value Date   MICROALBUR 0.3 02/06/2017   LDLCALC 52 09/06/2021   CREATININE 1.14 08/27/2022   Hypertension: Patient is a 62 y.o. male who present today for follow up of hypertension.   Patient endorses feeling like he has been urinating a lot more while on chlorthalidone  Home medications include: amlodipine 10, chlorthalidone 25 mg daily, losartan 100 mg daily, labetalol 200 mg BID Patient endorses taking these medications as prescribed.  Most recent creatinine trend:  Lab Results  Component Value Date   CREATININE 1.14 08/27/2022   CREATININE 1.31 (H) 09/06/2021   CREATININE 1.32 (H) 12/12/2020   PERTINENT  PMH / PSH: NSTEMI, OSA, T2DM, CVA  OBJECTIVE:   BP 127/65   Pulse 68   Ht 6\' 9"  (2.057 m)   Wt (!) 335 lb 2 oz (152 kg)   SpO2 100%   BMI 35.91 kg/m   General: Well appearing, NAD, awake, alert, responsive to questions Head: Normocephalic atraumatic Respiratory: chest rises symmetrically,  no increased work of breathing  ASSESSMENT/PLAN:   Bladder tumor He is preparing for surgery upcoming on 28th.  Coping with this news through his faith. Patient requested I be a part of prayer in office today which I accepted. Therapeutic hug given.  -Reassess after surgery performed  Essential hypertension Patient's blood pressure is controlled today. BP: 127/65. Goal of 140/90. Patient's medication regimen includes amlodipine 10, chlorthalidone 25 mg daily, losartan 100 mg daily, labetalol 200 mg BID. States he is urinating a lot from chlorthalidone but I wonder whether this is related to bladder tumor. Will reassess after surgery but given controlled right now will continue medications as they are. -Continue HTN meds -Follow up after surgery  Type 2 diabetes mellitus (HCC) A1c controlled today. Urine micro albumin/creatinine elevated-discussed he is on renal protective meds. -continue ARB -continue mounjaro 12.5 weekly    Levin Erp, MD [facility]

## 2022-09-19 ENCOUNTER — Encounter (HOSPITAL_COMMUNITY)
Admission: RE | Admit: 2022-09-19 | Discharge: 2022-09-19 | Disposition: A | Discharge status: Home / Self Care | Payer: Medicare Other | Source: Ambulatory Visit | Attending: Urology | Admitting: Urology

## 2022-09-19 ENCOUNTER — Other Ambulatory Visit: Payer: Self-pay

## 2022-09-19 ENCOUNTER — Encounter (HOSPITAL_COMMUNITY): Payer: Self-pay

## 2022-09-19 VITALS — BP 129/86 | HR 71 | Temp 98.2°F | Resp 16 | Ht >= 80 in | Wt 333.4 lb

## 2022-09-19 DIAGNOSIS — Z01818 Encounter for other preprocedural examination: Secondary | ICD-10-CM | POA: Diagnosis present

## 2022-09-19 DIAGNOSIS — E119 Type 2 diabetes mellitus without complications: Secondary | ICD-10-CM | POA: Insufficient documentation

## 2022-09-19 DIAGNOSIS — Z01812 Encounter for preprocedural laboratory examination: Secondary | ICD-10-CM | POA: Insufficient documentation

## 2022-09-19 HISTORY — DX: Acute myocardial infarction, unspecified: I21.9

## 2022-09-19 HISTORY — DX: Essential (primary) hypertension: I10

## 2022-09-19 LAB — GLUCOSE, CAPILLARY: Glucose-Capillary: 168 mg/dL — ABNORMAL HIGH (ref 70–99)

## 2022-09-20 ENCOUNTER — Telehealth: Payer: Self-pay | Admitting: *Deleted

## 2022-09-20 NOTE — Telephone Encounter (Signed)
Spoke with patient.  He will bring his CPAP machine and power cord to his appointment next Monday.  He mentioned that he has surgery planned for next Wednesday to remove a mass from his bladder.  He states at his preop appointment they already discussed that they would supply him with a CPAP machine so he does not believe he needs any clearance from [name].  He needs primary care and cardiology clearance however.  The patient verbalized appreciation for the call.

## 2022-09-24 ENCOUNTER — Ambulatory Visit: Payer: Medicare Other | Admitting: Neurology

## 2022-09-24 ENCOUNTER — Encounter: Payer: Self-pay | Admitting: Neurology

## 2022-09-24 VITALS — BP 113/73 | HR 65 | Ht >= 80 in | Wt 334.0 lb

## 2022-09-24 DIAGNOSIS — G4733 Obstructive sleep apnea (adult) (pediatric): Secondary | ICD-10-CM

## 2022-09-24 NOTE — Patient Instructions (Signed)
It was nice to see you again. You continue to do well with your CPAP, keep up the good work!  We can see you in 1 year, you can see one of our nurse practitioners as you are stable.

## 2022-09-24 NOTE — Progress Notes (Signed)
Subjective:    Patient ID: Brad [name], male    DOB: 07-Jan-1961, 62 y.o.   MRN: [id number]  HPI     Interim history:   Brad [name] is a 62 year old right-handed gentleman with an underlying medical history of hypertension, prior lacunar strokes, hemorrhagic stroke in April 2016, prior smoking and obesity, who presents for follow up consultation of his obstructive sleep apnea, on CPAP therapy, for his yearly checkup. The patient is unaccompanied today. I last saw him on 09/21/2021, at which time he was compliant with his machine, he was not keen on getting a new machine as his machine was working fine.  He was working on weight loss, he was on Ozempic.  He was advised to follow-up routinely in 1 year.  Today, 09/24/2022: I reviewed his CPAP compliance data from 08/25/2022 through 09/23/2022, which is a total of 30 days, during which time he used his machine every night with percent use days greater than 4 hours at 100%, indicating superb compliance with an average usage of 8 hours and 0 minutes, residual AHI at goal at 1/h, leak acceptable with the 95th percentile at 15.6 L/min on a pressure of 7 cm with EPR of 1.  He reports doing well with his current machine.  He has not seen an error message on it.  He has not noticed any difficulty with the machine, it works well.  He is not keen on pursuing a home sleep test or a new machine quite yet.  He had recent workup for hematuria. Of note, he has an upcoming surgery for a bladder tumor.  He was found to have a bladder mass by his urologist.  He had a recent CT of the abdomen and pelvis with contrast on 08/28/2022 and I reviewed the results: IMPRESSION: 1. Contrast enhancing, lobulated endoluminal mass of the anterior bladder dome, measuring 2.6 x 2.0 x 1.2 cm, consistent with primary bladder malignancy. 2. Prominent varices about the bladder dome and anterior bladder, without overt evidence of extramural tumor extent. 3. No evidence of lymphadenopathy or  metastatic disease in the abdomen or pelvis. 4. Prostatomegaly.   Aortic Atherosclerosis (ICD10-I70.0).      The patient's allergies, current medications, family history, past medical history, past social history, past surgical history and problem list were reviewed and updated as appropriate.   Previously:   I saw him on 09/15/2020, at which time he was fully compliant with his CPAP and doing well.  He was working on weight loss.  He was followed by weight management.  He was advised to follow-up routinely in 1 year.   I reviewed his CPAP compliance data from 08/22/2021 through 09/20/2021, which is a total of 30 days, during which time he used his machine every night with percent use days greater than 4 hours at 100%, indicating superb compliance with an average usage of 7 hours and 20 minutes, residual AHI at goal at 0.4/h, leak on the higher side with the 95th percentile at 27.2 L/min, pressure at 7 cm with EPR of 1.     I saw him on 09/16/2019, at which time he was fully compliant with his CPAP of 7 cm.  He was doing well and advised to follow-up routinely in 1 year.   I reviewed his CPAP compliance data from the past 30 days from 08/16/2020 through 09/14/2020, during which time he used his machine 100% with percent use days greater than 4 hours also at 100%, indicating superb compliance.  Average usage  of 7 hours and 54 minutes, residual AHI at goal at 0.4/h, 95th percentile of leak at 12.7 L/min on a pressure of 7 cm with EPR of 1.      He missed an appointment on 08/12/19. I saw him on 08/11/2018, At which time he was compliant with his CPAP and doing well.  He was advised to follow-up in 1 year routinely.    I reviewed his CPAP compliance data from 08/16/2019 through 09/14/2019, which is a total of 30 days, during which time he used his machine every night with percent use days greater than 4 hours at 100%, indicating superb compliance with an average usage of 7 hours and 25 minutes, residual  AHI at goal at 0.5/h, leak acceptable with a 95th percentile at 12.4 L/min on a pressure of 7 cm with EPR of 1.    I saw him on 08/08/2017, at which time he was compliant with his CPAP and doing well.  He was working on weight loss.  He was advised to follow-up in 1 year.    I reviewed his CPAP compliance data from 07/08/2018 through 078 2020 which is a total of 30 days, during which time he used his machine 29 days with percent used days greater than 4 hours at 97%, indicating excellent compliance with an average usage of 7 hours and 21 minutes, residual AHI at goal at 0.3/h, leak acceptable with a 95th percentile at 13.4 L/min on a pressure of 7 cm with EPR of 1.  Set up date was 10/18/2014.   I reviewed his CPAP compliance data from 07/07/2017 through 08/05/2017 which is a total of 30 days, during which time he used his CPAP every night with percent used days greater than 4 hours at 97%, indicating excellent compliance with an average usage of 5 hours and 53 minutes, residual AHI at goal at 0.5 per hour, leak acceptable with the 95th percentile at 7.9 L/m on a pressure of 7 cm with EPR of 1.   I saw him on 08/08/2016, at which time he was compliant with CPAP and reported ongoing good results. Working on weight loss as well.   I reviewed his CPAP compliance data from 07/08/2016 through 08/06/2016, which is a total of 30 days, during which time he used his CPAP every night with percent used days greater than 4 hours at 90%, indicating excellent compliance with an average usage of 5 hours and 3 minutes, residual AHI low at 0.5 per hour, leak acceptable with the 95th percentile at 14.6 L/m on a pressure of 7 cm with EPR of 1.    I saw him 02/09/2015, at which time he reported doing fairly well. He was tolerating CPAP and he was compliant with treatment. He indicated that he would wake up rested and daytime energy was better. He was working on weight loss and exercising regularly. We talked about is his  baseline sleep study results from August 2016 and his CPAP titration study results from September 2016 in detail at the time. He was encouraged to continue with CPAP therapy regularly.   I reviewed his CPAP compliance data from 07/09/2015 through 08/07/2015 which is a total of 30 days during which time he used his machine 24 days with percent used days greater than 4 hours at 60%, indicating suboptimal compliance with an average usage for all nights of 3 hours and 3 minutes, residual AHI 1.1 per hour, leak acceptable with the 95th percentile at 15.6 L/m on a pressure  of 7 cm with EPR of 1.   I first met him on 07/20/2014 at the request of Dr. Lucia Gaskins, at which time the patient reported snoring, multiple nighttime awakenings, nocturia and excessive daytime somnolence. I invited him back for sleep study. He had a baseline sleep study, followed by a CPAP titration study. I went over his test results with him in detail today. His baseline sleep study from 09/03/2014 showed a sleep efficiency of 63.9% with a latency to sleep of 81.5 minutes, and wake after sleep onset of 78 minutes with mild to moderate sleep fragmentation noted. He had an increased percentage of stage II sleep, absence of slow-wave sleep and REM sleep at 26.1% with a REM latency of 56 minutes. He had no significant PLMS, EKG or EEG changes. He had mild to moderate snoring. He did not have any supine sleep. Total AHI was 10 per hour, rising to 20.4 per hour during REM sleep. Average oxygen saturation was 96%, nadir was 90%. Given his sleep related complaints and his history of stroke, I invited him back for a full night CPAP titration study. He had this on 10/08/2014: Sleep efficiency was 73.6% with a latency of sleep of 13.5 minutes and wake after sleep onset of 87 minutes with mild sleep fragmentation noted. He had an increased percentage of stage 2 sleep, absence of slow wave sleep and increased REM sleep at 34.8% with a mildly reduced REM latency  of 51.5 minutes. He had no significant PLMS, EKG or EEG changes. Average oxygen saturation was 94%, nadir was 90%. CPAP was titrated from 5 cm to 8 cm. AHI was 0 per hour on a pressure of 7 cm. Based on his test results I prescribed CPAP therapy for home use.   I reviewed his CPAP compliance data from 01/09/2015 through 02/07/2015 which is a total of 30 days during which time he used his machine 27 days with percent used days greater than 4 hours at 80%, indicating very good compliance with an average usage of 4 hours and 52 minutes, residual AHI low at 0.7 per hour, leak at times high with the 95th percentile at 28.7 L/m on a pressure of 7 cm with EPR of 1.   07/20/2014: He reports snoring, has multiple nighttime awakenings, has nocturia and excessive daytime somnolence. His ESS is 20/24. His snoring can be loud according to his wife, who provides most of his history. His bedtime varies. He may be in bed around 10 PM but typically does not fall asleep until late at night. He watches TV in bed. He may fall asleep around 2 AM. He goes to the bathroom multiple times each night, an average of 5 times per night. He denies morning headaches. He's currently not working. He works in Set designer at [employer]. He works 12 hour shifts, from 7 AM to 7 PM. He works in [place]. He lives in [place]. He denies restless leg symptoms. He's not known to twitch in his sleep according to his wife. He denies peripheral edema. He does not drink caffeine every day. He denies alcohol consumption. He quit smoking in April 2016. His rise time varies. He may be up by 5 AM.     His Past Medical History Is Significant For: Past Medical History:  Diagnosis Date   Abnormal ultrasound of carotid artery 04/2014   no significant obstruction   Ataxia 04/2014   Diabetes (HCC)    EKG abnormalities 04/2014   poor R wave progression, NSR   Former  smoker    25 years x 1.5 ppd, stopped 04/2014   Gait disturbance 04/2014   s/p stroke    H/O echocardiogram 04/2014   moderate LVH, 60-65% EF, no valve disease   History of heart attack    Hypertension    Hypertensive heart disease 04/2014   Intraparenchymal hemorrhage of brain (HCC) 05/05/2014   hospitalization [facility]   Myocardial infarction [facility])    Obesity    Prediabetes    Short-term memory loss 04/2014   Sleep apnea    Stroke (HCC) 04/2014    His Past Surgical History Is Significant For: Past Surgical History:  Procedure Laterality Date   APPENDECTOMY     CHOLECYSTECTOMY     CIRCUMCISION     HEMORRHOID SURGERY      His Family History Is Significant For: Family History  Problem Relation Age of Onset   Heart disease Mother    Hypertension Mother    Sudden death Mother    Hypertension Brother    Aneurysm Paternal Grandmother     His Social History Is Significant For: Social History   Socioeconomic History   Marital status: Married    Spouse name: Proofreader   Number of children: 2   Years of education: 12   Highest education level: Not on file  Occupational History   Occupation: Retired   Tobacco Use   Smoking status: Former    Current packs/day: 0.00    Average packs/day: 1.5 packs/day for 25.0 years (37.5 ttl pk-yrs)    Types: Cigarettes    Start date: 05/04/1989    Quit date: 05/05/2014    Years since quitting: 8.3    Passive exposure: Past   Smokeless tobacco: Never  Vaping Use   Vaping status: Never Used  Substance and Sexual Activity   Alcohol use: No    Alcohol/week: 0.0 standard drinks of alcohol   Drug use: No   Sexual activity: Not on file  Other Topics Concern   Not on file  Social History Narrative   Lives at home with wife   Married to [name], non denominational, works at [employer] in [place], [place].  From [place], but moved to [place] 2016.     Caffeine use: Drinks tea occass   Social Determinants of Health   Financial Resource Strain: Low Risk  (07/30/2022)   Overall Financial Resource  Strain (CARDIA)    Difficulty of Paying Living Expenses: Not hard at all  Food Insecurity: No Food Insecurity (07/30/2022)   Hunger Vital Sign    Worried About Running Out of Food in the Last Year: Never true    Ran Out of Food in the Last Year: Never true  Transportation Needs: No Transportation Needs (07/30/2022)   PRAPARE - Administrator, Civil Service (Medical): No    Lack of Transportation (Non-Medical): No  Physical Activity: Insufficiently Active (07/30/2022)   Exercise Vital Sign    Days of Exercise per Week: 3 days    Minutes of Exercise per Session: 30 min  Stress: No Stress Concern Present (07/30/2022)   [employer] of Occupational Health - Occupational Stress Questionnaire    Feeling of Stress : Not at all  Social Connections: Moderately Integrated (07/30/2022)   Social Connection and Isolation Panel [NHANES]    Frequency of Communication with Friends and Family: More than three times a week    Frequency of Social Gatherings with Friends and Family: Three times a week    Attends Religious  Services: More than 4 times per year    Active Member of Clubs or Organizations: No    Attends Banker Meetings: Never    Marital Status: Married    His Allergies Are:  No Known Allergies:   His Current Medications Are:  Outpatient Encounter Medications as of 09/24/2022  Medication Sig   amLODipine (NORVASC) 10 MG tablet Take 1 tablet (10 mg total) by mouth daily.   Ascorbic Acid (VITAMIN C PO) Take 1 tablet by mouth daily.   blood glucose meter kit and supplies KIT Dispense based on patient and insurance preference. Check fasting blood sugar once daily ICD10 R73.03   chlorthalidone (HYGROTON) 25 MG tablet Take 1 tablet (25 mg total) by mouth daily.   CRESTOR 5 MG tablet Take 5 mg by mouth 2 (two) times a week.   glucose blood test strip Use as instructed to monitor FSBS 1x daily. Dx: R73.09   labetalol (NORMODYNE) 200 MG tablet TAKE 1 TABLET(200 MG) BY MOUTH  TWICE DAILY   Lancets (ONETOUCH ULTRASOFT) lancets Use as instructed   losartan (COZAAR) 100 MG tablet Take 1 tablet (100 mg total) by mouth daily.   MOUNJARO 12.5 MG/0.5ML Pen Inject 12.5 mg into the skin once a week.   naproxen (NAPROSYN) 375 MG tablet Take 1 tablet (375 mg total) by mouth 2 (two) times daily. (Patient taking differently: Take 375 mg by mouth 2 (two) times daily as needed for moderate pain.)   POTASSIUM PO Take 1 tablet by mouth daily.   tadalafil (CIALIS) 20 MG tablet TAKE 1 TABLET(20 MG) BY MOUTH DAILY AS NEEDED   Vitamin D, Ergocalciferol, (DRISDOL) 1.25 MG (50000 UNIT) CAPS capsule Take 1 capsule (50,000 Units total) by mouth every 7 (seven) days.   [DISCONTINUED] MOUNJARO 10 MG/0.5ML Pen Inject 12.5 mg into the skin once a week. (Patient not taking: Reported on 09/14/2022)   No facility-administered encounter medications on file as of 09/24/2022.  :  Review of Systems:  Out of a complete 14 point review of systems, all are reviewed and negative with the exception of these symptoms as listed below:  Review of Systems  Neurological:        Rm 9. Alone. Patient states he is doing well on CPAP.       Objective:   Physical Exam  Physical Examination:   Vitals:   09/24/22 0949  BP: 113/73  Pulse: 65    General Examination: The patient is a very pleasant 61 y.o. male in no acute distress. He appears well-developed and well-nourished and well groomed.   HEENT: Normocephalic, atraumatic, pupils are equal, round and reactive to light, tracking is good without limitation to gaze excursion or nystagmus noted. Normal smooth pursuit is noted.  Corrective eyeglasses in place.  Hearing is grossly intact. Face is symmetric with normal facial animation, speech is clear, no hypophonia. There is no lip, neck/head, jaw or voice tremor. Oropharynx exam reveals: No significant mouth dryness, stable findings, with airway crowding. Tongue protrudes centrally and palate elevates  symmetrically. No carotid bruits.   Chest: Clear to auscultation without wheezing, rhonchi or crackles noted.   Heart: S1+S2+0, regular and normal without murmurs, rubs or gallops noted.    Abdomen: Soft, non-tender and non-distended.   Extremities: There are no new findings, no edema.    Skin: Warm and dry without trophic changes noted.    Musculoskeletal: exam reveals no obvious joint deformities.    Neurologically:  Mental status: The patient is awake, alert  and oriented in all 4 spheres. His immediate and remote memory, attention, language skills and fund of knowledge are appropriate. There is no evidence of aphasia, agnosia, apraxia or anomia. Speech is clear with normal prosody and enunciation. Thought process is linear. Mood and affect are normal.  Cranial nerves II - XII are as described above under HEENT exam. Motor exam: Normal bulk, strength and tone is noted. There is no obvious tremor. Fine motor skills are grossly intact.  Sensory exam: intact to light touch.  Gait, station and balance: He stands without difficulty. Posture is age-appropriate. Gait shows normal stride length and normal pace. No problems turning are noted.                  Assessment and Plan:    In summary, Brad [name] is a 61 year old male with an underlying medical history of hypertension, hyperlipidemia, lacunar strokes, hemorrhagic stroke in April 2016, and obesity, who presents for follow-up consultation of his obstructive sleep apnea, well established on CPAP therapy at 7 cm with ongoing good results, very good apnea control and excellent compliance. He had a baseline sleep study in August 2016, and a subsequent CPAP titration study in September 2016 and has since then establish treatment at home, set up date was 10/18/2014. He has been working on weight loss, goes to the gym several days a week. He has been to the medical weight management clinic. He is up-to-date with PAP supplies typically. He is  commended for his treatment adherence with his CPAP and advised to follow-up routinely in 1 year.  He is not keen on getting a new machine currently, he is advised that we will proceed with a home sleep test in order to update his sleep apnea diagnosis and provide a new prescription for a new machine afterwards at the next visit or sooner if the need arises.  Thankfully, he has not had any problems with his current machine.  If he sees an error message as motor life has been exceeded, he is advised to call us, also if he has any trouble with the current machine.  He is advised to follow-up routinely in this clinic to see one of our nurse practitioners in 1 year.  We can offer him a MyChart video visit if he prefers.  He has an upcoming bladder surgery this week.  Hopefully he will do well.  I answered all of his questions today and he was in agreement with our plan. I spent 30 minutes in total face-to-face time and in reviewing records during pre-charting, more than 50% of which was spent in counseling and coordination of care, reviewing test results, reviewing medications and treatment regimen and/or in discussing or reviewing the diagnosis of OSA, the prognosis and treatment options. Pertinent laboratory and imaging test results that were available during this visit with the patient were reviewed by me and considered in my medical decision making (see chart for details).

## 2022-09-25 ENCOUNTER — Ambulatory Visit: Payer: Medicare Other | Admitting: Physician Assistant

## 2022-09-25 ENCOUNTER — Encounter: Payer: Self-pay | Admitting: Student

## 2022-09-25 ENCOUNTER — Encounter: Payer: Self-pay | Admitting: Physician Assistant

## 2022-09-25 VITALS — BP 130/80 | HR 85 | Ht >= 80 in | Wt 333.2 lb

## 2022-09-25 DIAGNOSIS — I10 Essential (primary) hypertension: Secondary | ICD-10-CM

## 2022-09-25 DIAGNOSIS — Z6835 Body mass index (BMI) 35.0-35.9, adult: Secondary | ICD-10-CM

## 2022-09-25 DIAGNOSIS — Z01818 Encounter for other preprocedural examination: Secondary | ICD-10-CM | POA: Diagnosis not present

## 2022-09-25 DIAGNOSIS — I214 Non-ST elevation (NSTEMI) myocardial infarction: Secondary | ICD-10-CM

## 2022-09-25 NOTE — Anesthesia Preprocedure Evaluation (Signed)
Anesthesia Evaluation  Patient identified by MRN, date of birth, ID band Patient awake    Reviewed: Allergy & Precautions, NPO status , Patient's Chart, lab work & pertinent test results  Airway Mallampati: II  TM Distance: >3 FB Neck ROM: Full    Dental  (+) Dental Advisory Given   Pulmonary sleep apnea , former smoker   breath sounds clear to auscultation       Cardiovascular hypertension, Pt. on home beta blockers and Pt. on medications + Past MI   Rhythm:Regular Rate:Normal     Neuro/Psych CVA    GI/Hepatic negative GI ROS, Neg liver ROS,,,  Endo/Other  diabetes    Renal/GU negative Renal ROS   Bladder mass    Musculoskeletal   Abdominal   Peds  Hematology negative hematology ROS (+)   Anesthesia Other Findings   Reproductive/Obstetrics                             Anesthesia Physical Anesthesia Plan  ASA: 2  Anesthesia Plan: General   Post-op Pain Management: Tylenol PO (pre-op)*   Induction:   PONV Risk Score and Plan: 2 and Dexamethasone, Ondansetron and Treatment may vary due to age or medical condition  Airway Management Planned: Oral ETT  Additional Equipment:   Intra-op Plan:   Post-operative Plan: Extubation in OR  Informed Consent:      Dental advisory given  Plan Discussed with: CRNA  Anesthesia Plan Comments:        Anesthesia Quick Evaluation

## 2022-09-25 NOTE — Progress Notes (Signed)
Anesthesia Chart Review   Case: [id number] Date/Time: 09/26/22 1015   Procedures:      CYSTOSCOPY - 60 MINUTES     TRANSURETHRAL RESECTION OF BLADDER TUMOR (TURBT) with GEMCITABINE   Anesthesia type: General   Pre-op diagnosis: BLADDER MASS   Location: [facility] ROOM 08 / [facility]   Surgeons: Rene Paci, MD       DISCUSSION:62 y.o. former smoker with h/o sleep apnea, malignant HTN, intracranial hemmorhage and NSTEMI due to HTN, CAD, bladder mass scheduled for above procedure 09/26/2022 with Dr. Cristal Deer Liliane Shi.   Pt seen by cardiology 09/25/2022. Per OV note, "Preop clearance for cysto/TURP tomorrow Dr. Rhoderick Moody. Patient with history of MI due to HTN 2016. No symptoms and doing well. He is acceptable risk to proceed with above surgery based on RCRI and METs. According to the Revised Cardiac Risk Index (RCRI), his Perioperative Risk of Major Cardiac Event is (%): 0.9   His Functional Capacity in METs is: 8.23 according to the Duke Activity Status Index (DASI)."  BP well controlled, pt asymptomatic.   VS: BP 129/86   Pulse 71   Temp 36.8 C (Oral)   Resp 16   Ht 6\' 9"  (2.057 m)   Wt (!) 151.2 kg   SpO2 99%   BMI 35.73 kg/m   PROVIDERS: Levin Erp, MD is PCP   Cardiologist:  Kristeen Miss, MD    LABS: Labs reviewed: Acceptable for surgery. (all labs ordered are listed, but only abnormal results are displayed)  Labs Reviewed  GLUCOSE, CAPILLARY - Abnormal; Notable for the following components:      Result Value   Glucose-Capillary 168 (*)    All other components within normal limits     IMAGES:   EKG:   CV: Echo 05/06/2014 Summary   1. Overall left ventricular ejection fraction is estimated at 60 to 65%.   2. Moderate concentric left ventricular hypertrophy.   3. Normal right ventricular size and systolic function.   4. Negative agitated saline bubble study.   5. No significant valve abnormalities.  Past Medical History:  Diagnosis Date    Abnormal ultrasound of carotid artery 04/2014   no significant obstruction   Ataxia 04/2014   Diabetes (HCC)    EKG abnormalities 04/2014   poor R wave progression, NSR   Former smoker    25 years x 1.5 ppd, stopped 04/2014   Gait disturbance 04/2014   s/p stroke   H/O echocardiogram 04/2014   moderate LVH, 60-65% EF, no valve disease   History of heart attack    Hypertension    Hypertensive heart disease 04/2014   Intraparenchymal hemorrhage of brain (HCC) 05/05/2014   hospitalization [facility]   Myocardial infarction [facility])    Obesity    Prediabetes    Short-term memory loss 04/2014   Sleep apnea    Stroke (HCC) 04/2014    Past Surgical History:  Procedure Laterality Date   APPENDECTOMY     CHOLECYSTECTOMY     CIRCUMCISION     HEMORRHOID SURGERY      MEDICATIONS:  amLODipine (NORVASC) 10 MG tablet   Ascorbic Acid (VITAMIN C PO)   blood glucose meter kit and supplies KIT   chlorthalidone (HYGROTON) 25 MG tablet   CRESTOR 5 MG tablet   glucose blood test strip   labetalol (NORMODYNE) 200 MG tablet   Lancets (ONETOUCH ULTRASOFT) lancets   losartan (COZAAR) 100 MG tablet   MOUNJARO 12.5 MG/0.5ML Pen   naproxen (NAPROSYN)  375 MG tablet   POTASSIUM PO   tadalafil (CIALIS) 20 MG tablet   Vitamin D, Ergocalciferol, (DRISDOL) 1.25 MG (50000 UNIT) CAPS capsule   No current facility-administered medications for this encounter.    Jodell Cipro Ward, PA-C [facility]-Surgical Testing [phone]

## 2022-09-25 NOTE — Patient Instructions (Signed)
Medication Instructions:   Your physician recommends that you continue on your current medications as directed. Please refer to the Current Medication list given to you today.  *If you need a refill on your cardiac medications before your next appointment, please call your pharmacy*    Follow-Up: At [facility], you and your health needs are our priority.  As part of our continuing mission to provide you with exceptional heart care, we have created designated Provider Care Teams.  These Care Teams include your primary Cardiologist (physician) and Advanced Practice Providers (APPs -  Physician Assistants and Nurse Practitioners) who all work together to provide you with the care you need, when you need it.  We recommend signing up for the patient portal called "MyChart".  Sign up information is provided on this After Visit Summary.  MyChart is used to connect with patients for Virtual Visits (Telemedicine).  Patients are able to view lab/test results, encounter notes, upcoming appointments, etc.  Non-urgent messages can be sent to your provider as well.   To learn more about what you can do with MyChart, go to [email or web address].    Your next appointment:   1 year(s)  Provider:   Philip [name], MD       

## 2022-09-26 ENCOUNTER — Encounter (HOSPITAL_COMMUNITY)
Admission: RE | Disposition: A | Discharge status: Home / Self Care | Payer: Self-pay | Source: Ambulatory Visit | Attending: Urology

## 2022-09-26 ENCOUNTER — Ambulatory Visit (HOSPITAL_COMMUNITY): Payer: Medicare Other

## 2022-09-26 ENCOUNTER — Ambulatory Visit (HOSPITAL_COMMUNITY): Payer: Medicare Other | Admitting: Physician Assistant

## 2022-09-26 ENCOUNTER — Ambulatory Visit (HOSPITAL_BASED_OUTPATIENT_CLINIC_OR_DEPARTMENT_OTHER): Payer: Medicare Other | Admitting: Certified Registered"

## 2022-09-26 ENCOUNTER — Ambulatory Visit (HOSPITAL_COMMUNITY)
Admission: RE | Admit: 2022-09-26 | Discharge: 2022-09-26 | Disposition: A | Discharge status: Home / Self Care | Payer: Medicare Other | Source: Ambulatory Visit | Attending: Urology | Admitting: Urology

## 2022-09-26 ENCOUNTER — Encounter (HOSPITAL_COMMUNITY): Payer: Self-pay | Admitting: Urology

## 2022-09-26 DIAGNOSIS — E669 Obesity, unspecified: Secondary | ICD-10-CM | POA: Diagnosis not present

## 2022-09-26 DIAGNOSIS — E119 Type 2 diabetes mellitus without complications: Secondary | ICD-10-CM | POA: Insufficient documentation

## 2022-09-26 DIAGNOSIS — I252 Old myocardial infarction: Secondary | ICD-10-CM | POA: Insufficient documentation

## 2022-09-26 DIAGNOSIS — Z6835 Body mass index (BMI) 35.0-35.9, adult: Secondary | ICD-10-CM | POA: Diagnosis not present

## 2022-09-26 DIAGNOSIS — I119 Hypertensive heart disease without heart failure: Secondary | ICD-10-CM | POA: Diagnosis not present

## 2022-09-26 DIAGNOSIS — C671 Malignant neoplasm of dome of bladder: Secondary | ICD-10-CM | POA: Insufficient documentation

## 2022-09-26 DIAGNOSIS — G4733 Obstructive sleep apnea (adult) (pediatric): Secondary | ICD-10-CM | POA: Diagnosis not present

## 2022-09-26 DIAGNOSIS — D494 Neoplasm of unspecified behavior of bladder: Secondary | ICD-10-CM

## 2022-09-26 DIAGNOSIS — Z87891 Personal history of nicotine dependence: Secondary | ICD-10-CM | POA: Diagnosis not present

## 2022-09-26 DIAGNOSIS — I700 Atherosclerosis of aorta: Secondary | ICD-10-CM | POA: Insufficient documentation

## 2022-09-26 DIAGNOSIS — I10 Essential (primary) hypertension: Secondary | ICD-10-CM

## 2022-09-26 DIAGNOSIS — N401 Enlarged prostate with lower urinary tract symptoms: Secondary | ICD-10-CM | POA: Diagnosis not present

## 2022-09-26 HISTORY — PX: CYSTOSCOPY: SHX5120

## 2022-09-26 LAB — GLUCOSE, CAPILLARY
Glucose-Capillary: 137 mg/dL — ABNORMAL HIGH (ref 70–99)
Glucose-Capillary: 121 mg/dL — ABNORMAL HIGH (ref 70–99)

## 2022-09-26 SURGERY — CYSTOSCOPY
Anesthesia: General

## 2022-09-26 MED ORDER — LIDOCAINE 2% (20 MG/ML) 5 ML SYRINGE
INTRAMUSCULAR | Status: DC | PRN
  Administered 2022-09-26: 40 mg via INTRAVENOUS

## 2022-09-26 MED ORDER — AMISULPRIDE (ANTIEMETIC) 5 MG/2ML IV SOLN: 10.0000 mg | Freq: Once | INTRAVENOUS | Status: DC | PRN

## 2022-09-26 MED ORDER — PROPOFOL 10 MG/ML IV BOLUS
INTRAVENOUS | Status: AC
  Filled 2022-09-26: qty 20

## 2022-09-26 MED ORDER — TRAMADOL HCL 50 MG PO TABS: 50.0000 mg | ORAL_TABLET | Freq: Four times a day (QID) | ORAL | 0 refills | Status: AC | PRN

## 2022-09-26 MED ORDER — DEXAMETHASONE SODIUM PHOSPHATE 10 MG/ML IJ SOLN
INTRAMUSCULAR | Status: AC
  Filled 2022-09-26: qty 1

## 2022-09-26 MED ORDER — ONDANSETRON HCL 4 MG/2ML IJ SOLN
INTRAMUSCULAR | Status: DC | PRN
Start: 2022-09-26 — End: 2022-09-26
  Administered 2022-09-26: 4 mg via INTRAVENOUS

## 2022-09-26 MED ORDER — ONDANSETRON HCL 4 MG/2ML IJ SOLN
INTRAMUSCULAR | Status: AC
  Filled 2022-09-26: qty 2

## 2022-09-26 MED ORDER — OXYBUTYNIN CHLORIDE 5 MG PO TABS: 5.0000 mg | ORAL_TABLET | Freq: Three times a day (TID) | ORAL | 1 refills | Status: DC | PRN

## 2022-09-26 MED ORDER — FENTANYL CITRATE (PF) 100 MCG/2ML IJ SOLN
INTRAMUSCULAR | Status: DC | PRN
  Administered 2022-09-26 (×2): 50 ug via INTRAVENOUS

## 2022-09-26 MED ORDER — ROCURONIUM BROMIDE 10 MG/ML (PF) SYRINGE
PREFILLED_SYRINGE | INTRAVENOUS | Status: DC | PRN
  Administered 2022-09-26: 20 mg via INTRAVENOUS
  Administered 2022-09-26: 70 mg via INTRAVENOUS

## 2022-09-26 MED ORDER — PHENAZOPYRIDINE HCL 200 MG PO TABS: 200.0000 mg | ORAL_TABLET | Freq: Three times a day (TID) | ORAL | 0 refills | Status: DC | PRN

## 2022-09-26 MED ORDER — FENTANYL CITRATE (PF) 100 MCG/2ML IJ SOLN
INTRAMUSCULAR | Status: AC
  Filled 2022-09-26: qty 2

## 2022-09-26 MED ORDER — SUGAMMADEX SODIUM 200 MG/2ML IV SOLN
INTRAVENOUS | Status: DC | PRN
  Administered 2022-09-26: 300 mg via INTRAVENOUS

## 2022-09-26 MED ORDER — CHLORHEXIDINE GLUCONATE 0.12 % MT SOLN
15.0000 mL | Freq: Once | OROMUCOSAL | Status: AC
  Administered 2022-09-26: 15 mL via OROMUCOSAL

## 2022-09-26 MED ORDER — LACTATED RINGERS IV SOLN: INTRAVENOUS | Status: DC

## 2022-09-26 MED ORDER — MIDAZOLAM HCL 2 MG/2ML IJ SOLN
INTRAMUSCULAR | Status: DC | PRN
  Administered 2022-09-26: 2 mg via INTRAVENOUS

## 2022-09-26 MED ORDER — FENTANYL CITRATE PF 50 MCG/ML IJ SOSY: 25.0000 ug | PREFILLED_SYRINGE | INTRAMUSCULAR | Status: DC | PRN

## 2022-09-26 MED ORDER — ACETAMINOPHEN 500 MG PO TABS
1000.0000 mg | ORAL_TABLET | Freq: Once | ORAL | Status: AC
  Administered 2022-09-26: 1000 mg via ORAL
  Filled 2022-09-26: qty 2

## 2022-09-26 MED ORDER — PROPOFOL 10 MG/ML IV BOLUS
INTRAVENOUS | Status: DC | PRN
  Administered 2022-09-26: 250 mg via INTRAVENOUS

## 2022-09-26 MED ORDER — CEFAZOLIN IN SODIUM CHLORIDE 3-0.9 GM/100ML-% IV SOLN
3.0000 g | INTRAVENOUS | Status: AC
  Administered 2022-09-26: 3 g via INTRAVENOUS
  Filled 2022-09-26: qty 100

## 2022-09-26 MED ORDER — EPHEDRINE 5 MG/ML INJ
INTRAVENOUS | Status: AC
  Filled 2022-09-26: qty 5

## 2022-09-26 MED ORDER — DEXAMETHASONE SODIUM PHOSPHATE 10 MG/ML IJ SOLN
INTRAMUSCULAR | Status: DC | PRN
  Administered 2022-09-26: 4 mg via INTRAVENOUS

## 2022-09-26 MED ORDER — IOHEXOL 300 MG/ML  SOLN
INTRAMUSCULAR | Status: DC | PRN
  Administered 2022-09-26: 100 mL

## 2022-09-26 MED ORDER — SODIUM CHLORIDE 0.9 % IR SOLN
Status: DC | PRN
  Administered 2022-09-26 (×4): 3000 mL via INTRAVESICAL

## 2022-09-26 MED ORDER — GEMCITABINE CHEMO FOR BLADDER INSTILLATION 2000 MG
2000.0000 mg | Freq: Once | INTRAVENOUS | Status: AC
  Administered 2022-09-26: 2000 mg via INTRAVESICAL
  Filled 2022-09-26: qty 2000

## 2022-09-26 MED ORDER — EPHEDRINE SULFATE-NACL 50-0.9 MG/10ML-% IV SOSY
PREFILLED_SYRINGE | INTRAVENOUS | Status: DC | PRN
  Administered 2022-09-26 (×3): 5 mg via INTRAVENOUS
  Administered 2022-09-26: 10 mg via INTRAVENOUS

## 2022-09-26 MED ORDER — OXYBUTYNIN CHLORIDE 5 MG PO TABS
ORAL_TABLET | ORAL | Status: AC
  Administered 2022-09-26: 10 mg via ORAL
  Filled 2022-09-26: qty 2

## 2022-09-26 MED ORDER — ORAL CARE MOUTH RINSE: 15.0000 mL | Freq: Once | OROMUCOSAL | Status: AC

## 2022-09-26 MED ORDER — OXYBUTYNIN CHLORIDE 5 MG PO TABS: 10.0000 mg | ORAL_TABLET | Freq: Once | ORAL | Status: AC

## 2022-09-26 MED ORDER — MIDAZOLAM HCL 2 MG/2ML IJ SOLN
INTRAMUSCULAR | Status: AC
  Filled 2022-09-26: qty 2

## 2022-09-26 MED ORDER — PHENYLEPHRINE HCL-NACL 20-0.9 MG/250ML-% IV SOLN
INTRAVENOUS | Status: DC | PRN
  Administered 2022-09-26: 20 ug/min via INTRAVENOUS

## 2022-09-26 SURGICAL SUPPLY — 17 items
BAG DRN RND TRDRP ANRFLXCHMBR (UROLOGICAL SUPPLIES) ×1
BAG URINE DRAIN 2000ML AR STRL (UROLOGICAL SUPPLIES) IMPLANT
BAG URO CATCHER STRL LF (MISCELLANEOUS) ×1 IMPLANT
CATH FOLEY 2WAY SLVR 18FR 30CC (CATHETERS) IMPLANT
CATH URETL OPEN 5X70 (CATHETERS) ×1 IMPLANT
CLOTH BEACON ORANGE TIMEOUT ST (SAFETY) ×1 IMPLANT
GLOVE SURG LX STRL 7.5 STRW (GLOVE) ×1 IMPLANT
GOWN STRL REUS W/ TWL XL LVL3 (GOWN DISPOSABLE) ×1 IMPLANT
GOWN STRL REUS W/TWL XL LVL3 (GOWN DISPOSABLE) ×1
GUIDEWIRE ZIPWRE .038 STRAIGHT (WIRE) ×1 IMPLANT
KIT TURNOVER KIT A (KITS) IMPLANT
LOOP CUT BIPOLAR 24F LRG (ELECTROSURGICAL) IMPLANT
MANIFOLD NEPTUNE II (INSTRUMENTS) ×1 IMPLANT
PACK CYSTO (CUSTOM PROCEDURE TRAY) ×1 IMPLANT
SYR TOOMEY IRRIG 70ML (MISCELLANEOUS) ×1
SYRINGE TOOMEY IRRIG 70ML (MISCELLANEOUS) IMPLANT
TUBING CONNECTING 10 (TUBING) ×1 IMPLANT

## 2022-09-26 NOTE — Transfer of Care (Signed)
Immediate Anesthesia Transfer of Care Note  Patient: Brad [name]  Procedure(s) Performed: CYSTOSCOPY TRANSURETHRAL RESECTION OF BLADDER TUMOR (TURBT) with GEMCITABINE  Patient Location: PACU  Anesthesia Type:General  Level of Consciousness: awake, alert , and patient cooperative  Airway & Oxygen Therapy: Patient Spontanous Breathing and Patient connected to face mask oxygen  Post-op Assessment: Report given to RN and Post -op Vital signs reviewed and stable  Post vital signs: Reviewed and stable  Last Vitals:  Vitals Value Taken Time  BP 127/89 09/26/22 1128  Temp    Pulse 70 09/26/22 1130  Resp 13 09/26/22 1130  SpO2 100 % 09/26/22 1130  Vitals shown include unfiled device data.  Last Pain:  Vitals:   09/26/22 0846  TempSrc:   PainSc: 6          Complications: No notable events documented.

## 2022-09-26 NOTE — Anesthesia Postprocedure Evaluation (Signed)
Anesthesia Post Note  Patient: Brad [name]  Procedure(s) Performed: CYSTOSCOPY TRANSURETHRAL RESECTION OF BLADDER TUMOR (TURBT) with GEMCITABINE     Patient location during evaluation: PACU Anesthesia Type: General Level of consciousness: awake and alert Pain management: pain level controlled Vital Signs Assessment: post-procedure vital signs reviewed and stable Respiratory status: spontaneous breathing, nonlabored ventilation, respiratory function stable and patient connected to nasal cannula oxygen Cardiovascular status: blood pressure returned to baseline and stable Postop Assessment: no apparent nausea or vomiting Anesthetic complications: no  No notable events documented.  Last Vitals:  Vitals:   09/26/22 1315 09/26/22 1320  BP:  127/67  Pulse:  62  Resp:  14  Temp: 36.6 C 36.6 C  SpO2:  98%    Last Pain:  Vitals:   09/26/22 1320  TempSrc: Temporal  PainSc: 0-No pain                 Kennieth Rad

## 2022-09-26 NOTE — Anesthesia Procedure Notes (Signed)
Procedure Name: Intubation Date/Time: 09/26/2022 10:04 AM  Performed by: Sindy Guadeloupe, CRNAPre-anesthesia Checklist: Patient identified, Emergency Drugs available, Suction available, Patient being monitored and Timeout performed Patient Re-evaluated:Patient Re-evaluated prior to induction Oxygen Delivery Method: Circle system utilized Preoxygenation: Pre-oxygenation with 100% oxygen Induction Type: IV induction Ventilation: Mask ventilation without difficulty Laryngoscope Size: Mac and 4 Grade View: Grade II Tube type: Oral Tube size: 7.5 mm Number of attempts: 1 Airway Equipment and Method: Stylet Placement Confirmation: ETT inserted through vocal cords under direct vision, positive ETCO2 and breath sounds checked- equal and bilateral Secured at: 24 cm Tube secured with: Tape Dental Injury: Teeth and Oropharynx as per pre-operative assessment

## 2022-09-26 NOTE — Op Note (Signed)
Operative Note  Preoperative diagnosis:  1.  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  Cystoscopy with TURBT (medium) 2.  Intravesical instillation of gemcitabine 3.  Cystogram with intraoperative interpretation of fluoroscopic imaging  Surgeon: Rhoderick Moody, MD  Assistants:  None  Anesthesia:  General  Complications:  None  EBL: 50 mL  Specimens: 1.  Superficial and deep margins of bladder dome tumor  Drains/Catheters: 1.  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 male with a solid enhancing 2.5 cm anterior bladder dome mass that was seen on CT hematuria protocol on 08/28/2022 during evaluation for gross hematuria.  He has been consented for the above procedures, voices understanding and wishes to proceed.  Description of procedure:  After informed consent was obtained, the patient was brought to the operating room and general endotracheal anesthesia was administered. The patient was then placed in the dorsolithotomy position and prepped and draped in the usual sterile fashion. A timeout was performed. A 23 French rigid cystoscope was then inserted into the urethral meatus and advanced into the bladder under direct vision. A complete bladder survey revealed a sessile appearing bladder tumor involving the bladder dome.  The rigid cystoscope was then exchanged for a 26 French resectoscope with a bipolar loop working element.  The bladder dome tumor was then resected down to the detrusor musculature.  I sent superficial and deep margins of the tumor for pathologic analysis.  The tumor  grossly appeared to invade the detrusor musculature.  There was no gross evidence of bladder perforation.  A cystogram was obtained and the bladder was filled with approximately 50 mL of Omnipaque and 300 mL of saline.  There is a clear outline of the bladder with no evidence of contrast extravasation during the filling and emptying phases of the cystogram.  Electric was used to achieve excellent hemostasis from the resection bed.  An 31 French Foley catheter was then inserted and hand irrigated and found to be free of clot or tumor debris.  The catheter was placed to gravity drainage.  Patient tolerated the procedure well and was transferred to the postanesthesia in stable condition.  While in the recovery room 2000 mg of gemcitabine in 50 mL of water was instilled in the bladder through the catheter and the catheter was plugged. This will remain indwelling for approximately one hour. It will then be drained from the bladder and the catheter will be removed and the patient discharged home.   Plan: Drain bladder and remove Foley catheter 1 hour after gemcitabine instillation.  Follow-up on 10/05/2022 to discuss pathology results

## 2022-09-26 NOTE — H&P (Signed)
Office Visit Report     09/06/2022   --------------------------------------------------------------------------------   Brad [name]  MRN: [id number]  DOB: 03/04/1960, 62 year old Male  SSN:    PRIMARY CARE:     REFERRING:    PROVIDER:  Rhoderick Moody, M.D.  LOCATION:  [facility] [phone]     --------------------------------------------------------------------------------   CC/HPI: Bladder mass   The patient is a 62 year old male who was found to have a solid enhancing 2.5 cm anterior bladder dome mass on CT hematuria protocol on 08/28/2022 during evaluation for gross hematuria.   -Previously smoked 1.5 ppd for 20+ years--quit 7 years ago  -No personal/family history of GU malignanies  -Denies interval episodes of gross hematuria  - From a urinary standpoint, he reports a good FOS and feels like he is emptying his bladder well. He has occasional urgency/frequency, but is not bothered by it. Nocturia x 1. Denies interval UTIs, dysuria or hematuria.   PCP: Levin Erp, MD  Cardiologist: Kristeen Miss, MD     ALLERGIES: No Known Allergies    MEDICATIONS: Amlodipine Besylate 10 mg tablet  Chlorthalidone 25 mg tablet  Labetalol Hcl 200 mg tablet  Losartan Potassium 100 mg tablet  Mounjaro  Naproxen  Potassium  Tadalafil 20 mg tablet  Vitamin D2     GU PSH: Circumcision     NON-GU PSH: Hemorrhoidectomy Remove Gallbladder     GU PMH: None     PMH Notes: Cardiovascular and Mediastinum  Essential hypertension  NSTEMI (non-ST elevated myocardial infarction) (HCC)   Respiratory  OSA (obstructive sleep apnea)   Endocrine  Type 2 diabetes mellitus (HCC)   Nervous and Auditory  Left-sided weakness   Genitourinary  Gross hematuria   Other  Obesity  History of stroke  Former smoker  Erectile dysfunction  Vitamin D deficiency  Bladder cancer.  Heart attack.     NON-GU PMH: Arthritis Diabetes Type 2 Hypertension Sleep  Apnea Stroke/TIA    FAMILY HISTORY: No Family History    SOCIAL HISTORY: Marital Status: Married Race: Black or African American Current Smoking Status: Patient does not smoke anymore.   Tobacco Use Assessment Completed: Used Tobacco in last 30 days? Does not drink anymore.  Drinks 3 caffeinated drinks per day.    REVIEW OF SYSTEMS:    GU Review Male:   Patient reports frequent urination and get up at night to urinate. Patient denies hard to postpone urination, burning/ pain with urination, leakage of urine, stream starts and stops, trouble starting your stream, have to strain to urinate , erection problems, and penile pain.  Gastrointestinal (Upper):   Patient denies nausea, vomiting, and indigestion/ heartburn.  Gastrointestinal (Lower):   Patient denies diarrhea and constipation.  Constitutional:   Patient denies fever, night sweats, weight loss, and fatigue.  Skin:   Patient denies skin rash/ lesion and itching.  Eyes:   Patient denies blurred vision and double vision.  Ears/ Nose/ Throat:   Patient denies sore throat and sinus problems.  Hematologic/Lymphatic:   Patient denies swollen glands and easy bruising.  Cardiovascular:   Patient denies leg swelling and chest pains.  Respiratory:   Patient denies cough and shortness of breath.  Endocrine:   Patient denies excessive thirst.  Musculoskeletal:   Patient reports back pain. Patient denies joint pain.  Neurological:   Patient denies headaches and dizziness.  Psychologic:   Patient denies depression and anxiety.   VITAL SIGNS:      09/06/2022 10:18 AM  Weight 327.0 lb /  148.32 kg  BP 113/77 mmHg  Pulse 76 /min  Temperature 98.6 F / 37 C   MULTI-SYSTEM PHYSICAL EXAMINATION:    Constitutional: Well-nourished. No physical deformities. Normally developed. Good grooming.  Neurologic / Psychiatric: Oriented to time, oriented to place, oriented to person. No depression, no anxiety, no agitation.     Complexity of Data:  Lab  Test Review:   PSA  Records Review:   Previous Patient Records  X-Ray Review: C.T. Hematuria: Reviewed Films. Reviewed Report. Discussed With Patient.    Notes:                     CLINICAL DATA: Microscopic hematuria, left lower back pain for 2  weeks * Tracking Code: BO *   EXAM:  CT ABDOMEN AND PELVIS WITHOUT AND WITH CONTRAST   TECHNIQUE:  Multidetector CT imaging of the abdomen and pelvis was performed  following the standard protocol before and following the bolus  administration of intravenous contrast.   RADIATION DOSE REDUCTION: This exam was performed according to the  departmental dose-optimization program which includes automated  exposure control, adjustment of the mA and/or kV according to  patient size and/or use of iterative reconstruction technique.   CONTRAST: OMNIPAQUE IOHEXOL 350 MG/ML SOLN   COMPARISON: None Available.   FINDINGS:  Lower chest: No acute abnormality.   Hepatobiliary: No solid liver abnormality is seen. Small simple  benign liver cysts, requiring no specific further follow-up or  characterization. No gallstones, gallbladder wall thickening, or  biliary dilatation.   Pancreas: Unremarkable. No pancreatic ductal dilatation or  surrounding inflammatory changes.   Spleen: Normal in size without significant abnormality.   Adrenals/Urinary Tract: Adrenal glands are unremarkable. 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 No calculi or  hydronephrosis. Contrast enhancing, lobulated endoluminal mass of  the anterior bladder dome,  measuring 2.6 x 2.0 x 1.2 cm (series 8,  image 79, series 12, image 96). Prominent varices about the bladder  dome and anterior bladder (series 8, image 83, series 12, image 96).  No other filling defect on delayed phase imaging.   Stomach/Bowel: Stomach is within normal limits. Appendix not clearly  visualized and may be surgically absent. No evidence of bowel wall  thickening, distention, or inflammatory changes.   Vascular/Lymphatic: Aortic atherosclerosis. No enlarged abdominal or  pelvic lymph nodes.   Reproductive: Prostatomegaly.   Other: No abdominal wall hernia or abnormality. No ascites.   Musculoskeletal: No acute or significant osseous findings.   IMPRESSION:  1. Contrast enhancing, lobulated endoluminal mass of the anterior  bladder dome, measuring 2.6 x 2.0 x 1.2 cm, consistent with primary  bladder malignancy.  2. Prominent varices about the bladder dome and anterior bladder,  without overt evidence of extramural tumor extent.  3. No evidence of lymphadenopathy or metastatic disease in the  abdomen or pelvis.  4. Prostatomegaly.   Aortic Atherosclerosis (ICD10-I70.0).    Electronically Signed  By: Jearld Lesch M.D.  On: 08/28/2022 12:56     PROCEDURES:          Urinalysis w/Scope Dipstick Dipstick Cont'd Micro  Color:  Yellow Bilirubin: Neg mg/dL WBC/hpf: 0 - 5/hpf  Appearance: Slightly Cloudy Ketones: Neg mg/dL RBC/hpf: 20 - 84/ONG  Specific Gravity: 1.025 Blood: 3+ ery/uL Bacteria: Few (10-25/hpf)  pH: 6.0 Protein: Trace mg/dL Cystals: NS (Not Seen)  Glucose: Neg mg/dL Urobilinogen: 0.2 mg/dL Casts: NS (Not Seen)    Nitrites: Neg Trichomonas: Not Present    Leukocyte Esterase: Trace leu/uL Mucous: Not Present      Epithelial Cells: 0 - 5/hpf      Yeast: NS (Not Seen)      Sperm: Not Present    ASSESSMENT:      ICD-10 Details  1 GU:   Bladder tumor/neoplasm - D41.4 Undiagnosed New Problem   PLAN:           Orders Labs Urine Culture           Schedule Return Visit/Planned Activity: Next Available Appointment - Schedule Surgery          Document Letter(s):  Created for Patient: Clinical Summary         Notes:   The risks, benefits and alternatives of cystoscopy with TURBT with gemcitabine instillation was discussed with the patient. The risks include, but are not limited to, bleeding, urinary tract infection, bladder perforation requiring prolonged catheterization and/or open bladder repair, ureteral obstruction, voiding dysfunction and the inherent risks of general anesthesia. The patient voices understanding and wishes to proceed.

## 2022-09-27 ENCOUNTER — Encounter (HOSPITAL_COMMUNITY): Payer: Self-pay | Admitting: Urology

## 2022-10-02 ENCOUNTER — Encounter: Payer: Self-pay | Admitting: Student

## 2022-10-04 LAB — SURGICAL PATHOLOGY

## 2022-10-05 ENCOUNTER — Other Ambulatory Visit: Payer: Self-pay | Admitting: Urology

## 2022-10-08 ENCOUNTER — Telehealth: Payer: Self-pay | Admitting: Student

## 2022-10-08 NOTE — Telephone Encounter (Signed)
Called patient to discuss with him after surgery. He discussed with me that he was told by urology his mass was cancerous and that urology discussed that overall resection looked good but they need to go back into bladder and check surrounding tissues. They have TURB scheduled in 11/20/22. We talked through what he knows of procedure and questions about possibilities of urostomy bag and general concerns. Patient requested a prayer with provider and I obliged. Patient very thankful for call. I will set reminder to touch base with him again before his next procedure 10/22.

## 2022-10-26 NOTE — Progress Notes (Unsigned)
[facility] CONSULT NOTE  Patient Care Team: Levin Erp, MD as PCP - General (Family Medicine) [name], Deloris Ping, [name], [facility] Melven Sartorius, [name]  ASSESSMENT & PLAN:  Brad [name] is a 62 y.o.male with history of hypertension, CVA, type 2 diabetes, OSA and history of smoking quit 7 years ago prior to diagnosis being seen at [facility] for muscle invasive bladder cancer.  Current diagnosis: T2 MIBC Urologist: Dr. Cristal Deer Winter CrCl >60  Discussed per NCCN guideline, neoadjuvant therapy with ddMVAC (methotrexate, vinblastine, doxorubicin, and cisplatin) followed by radical cystectomy.  Patient has normal kidney function.  Discussed clinical evidence of neoadjuvant dose dense MVAC with GCSF versus other treatment. SWOG trial of over 300 and patient with muscle invasive disease randomized to radical cystectomy alone versus 3 cycles of neoadjuvant MVAC followed by cystectomy show improvement in median survival of 77 versus 46 months. VESPER trial reported complete pathologic response of 42% versus 36%. PFS at 3 years was significantly higher in the dd-MVAC arm 66% v 56% in GC.  We also discussed chemoradiation.  After meeting with Urology for assessment and evaluation, he is comfortable with neoadjuvant therapy followed by cystectomy.  He would like to proceed.  Discussed potential side effects, and schedules.  Patient will need chemotherapy teaching.  Discussed risk of anemia, fatigue, thrombocytopenia, neutropenia, risk of infection, sepsis.  Discussed potential risk of infection, mucositis, electrolyte abnormality from dehydration, neuropathy, hearing loss, renal insufficiency which can lead to other end organ damage.  Discussed need for hospitalization, ED visit with neutropenic fever.  Any signs of infection should be taken seriously.  Report to ED with fever.  Plan teaching  for [facility] on Tue 10/15. Plan for 6 cycles or as tolerated. Repeat every 2 weeks See me on 10/28 with lab appointment one hour before visit. Repeat every 2 weeks Baseline CT chest now CT CAP with contrast after completion of chemo Port placement ordered  Drug monitoring Echo at baseline Monitor for neutropenic fever and signs of infection Hydration 64 to 70+ ounces per day CBC with differential, CMP 1 day before each cycle Monitored for the hearing loss, neuropathy, renal function closely   No problem-specific Assessment & Plan notes found for this encounter.   Orders Placed This Encounter  Procedures   CT Chest W Contrast    Standing Status:   Future    Standing Expiration Date:   10/29/2023    Order Specific Question:   If indicated for the ordered procedure, I authorize the administration of contrast media per Radiology protocol    Answer:   Yes    Order Specific Question:   Does the patient have a contrast media/X-ray dye allergy?    Answer:   No    Order Specific Question:   Preferred imaging location?    Answer:   [facility]   IR IMAGING GUIDED PORT INSERTION    Standing Status:   Future    Standing Expiration Date:   10/29/2023    Order Specific Question:   Reason for Exam (SYMPTOM  OR DIAGNOSIS REQUIRED)    Answer:   port for chemotherapy    Order Specific Question:   Preferred Imaging Location?    Answer:   [facility]   CBC with Differential (Cancer Center Only)    Standing Status:   Future    Number of Occurrences:   1  Standing Expiration Date:   10/29/2023   CMP (Cancer Center only)    Standing Status:   Future    Number of Occurrences:   1    Standing Expiration Date:   10/29/2023    The total time spent in the appointment was 110 minutes encounter with patients including review of chart and various tests results, discussions about plan of care and coordination of care plan   All questions were answered. The patient knows to  call the clinic with any problems, questions or concerns. No barriers to learning was detected.  Melven Sartorius, MD 9/30/20244:23 PM  CHIEF COMPLAINTS/PURPOSE OF CONSULTATION:  muscle invasive bladder cancer   HISTORY OF PRESENTING ILLNESS:  Brad [name] 62 y.o. male is here because of muscle invasive bladder cancer.  I have reviewed his chart and materials related to his cancer extensively and collaborated history with the patient. Summary of oncologic history is as follows:  Report of hematuria for about a month.  Prior to that, he has been healthy.  Since his hemorrhagic stroke a few years ago, he has been doing well, exercising adherent healthier lifestyle.  He has no physical limitations.  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  Rest of review of system was negative as below.  Oncology History  Bladder cancer (HCC)  08/28/2022 Imaging   1. Contrast enhancing, lobulated endoluminal mass of the anterior bladder dome, measuring 2.6 x 2.0 x 1.2 cm, consistent with primary bladder malignancy. 2. Prominent varices about the bladder dome and anterior bladder, without overt evidence of extramural tumor extent. 3. No evidence of lymphadenopathy or metastatic disease in the abdomen or pelvis. 4. Prostatomegaly.   09/11/2022 Initial Diagnosis   Bladder cancer [facility]) At age 73 year old found to have a solid enhancing 2.5 cm anterior bladder dome mass on CT hematuria protocol on 08/28/2022 during evaluation for gross hematuria.    09/26/2022 Pathology Results   A. BLADDER TUMOR, SUPERFICIAL MARGIN DOME, TURBT:  High grade papillary urothelial carcinoma with inverted growth pattern  The carcinoma invades muscularis propria   B. BLADDER TUMOR, DEEP MARGIN DOME, TURBT:  Invasive high grade papillary urothelial carcinoma  Muscularis propria (detrusor muscle) is present and not involved by  carcinoma   C. BLADDER TUMOR, INFERIOR MARGIN, BIOPSY:  Submucosa and muscularis propria is present and not  involved by  carcinoma    09/26/2022 Procedure   1.  Cystoscopy with TURBT (medium) 2.  Intravesical instillation of gemcitabine 3.  Cystogram with intraoperative interpretation of fluoroscopic imaging   11/12/2022 -  Chemotherapy   Patient is on Treatment Plan : BLADDER DOSE DENSE MVAC q14d       MEDICAL HISTORY:  Past Medical History:  Diagnosis Date   Abnormal ultrasound of carotid artery 04/2014   no significant obstruction   Ataxia 04/2014   Diabetes (HCC)    EKG abnormalities 04/2014   poor R wave progression, NSR   Former smoker    25 years x 1.5 ppd, stopped 04/2014   Gait disturbance 04/2014   s/p stroke   H/O echocardiogram 04/2014   moderate LVH, 60-65% EF, no valve disease   History of heart attack    Hypertension    Hypertensive heart disease 04/2014   Intraparenchymal hemorrhage of brain [facility]) 05/05/2014   hospitalization [facility]   Myocardial infarction [facility])    Obesity    Prediabetes    Short-term memory loss 04/2014   Sleep apnea    Stroke (HCC) 04/2014  SURGICAL HISTORY: Past Surgical History:  Procedure Laterality Date   APPENDECTOMY     CHOLECYSTECTOMY     CIRCUMCISION     CYSTOSCOPY N/A 09/26/2022   Procedure: CYSTOSCOPY;  Surgeon: Rene Paci, MD;  Location: [facility] ORS;  Service: Urology;  Laterality: N/A;  60 MINUTES   HEMORRHOID SURGERY      SOCIAL HISTORY: Social History   Socioeconomic History   Marital status: Married    Spouse name: Proofreader   Number of children: 2   Years of education: 12   Highest education level: Not on file  Occupational History   Occupation: Retired   Tobacco Use   Smoking status: Former    Current packs/day: 0.00    Average packs/day: 1.5 packs/day for 25.0 years (37.5 ttl pk-yrs)    Types: Cigarettes    Start date: 05/04/1989    Quit date: 05/05/2014    Years since quitting: 8.4    Passive exposure: Past   Smokeless tobacco: Never  Vaping Use   Vaping status: Never  Used  Substance and Sexual Activity   Alcohol use: No    Alcohol/week: 0.0 standard drinks of alcohol   Drug use: No   Sexual activity: Not on file  Other Topics Concern   Not on file  Social History Narrative   Lives at home with wife   Married to [name], non denominational, works at [employer] in [place], [place].  From [place], but moved to [place] 2016.     Caffeine use: Drinks tea occass   Social Determinants of Health   Financial Resource Strain: Low Risk  (07/30/2022)   Overall Financial Resource Strain (CARDIA)    Difficulty of Paying Living Expenses: Not hard at all  Food Insecurity: No Food Insecurity (07/30/2022)   Hunger Vital Sign    Worried About Running Out of Food in the Last Year: Never true    Ran Out of Food in the Last Year: Never true  Transportation Needs: No Transportation Needs (07/30/2022)   PRAPARE - Administrator, Civil Service (Medical): No    Lack of Transportation (Non-Medical): No  Physical Activity: Insufficiently Active (07/30/2022)   Exercise Vital Sign    Days of Exercise per Week: 3 days    Minutes of Exercise per Session: 30 min  Stress: No Stress Concern Present (07/30/2022)   [employer] of Occupational Health - Occupational Stress Questionnaire    Feeling of Stress : Not at all  Social Connections: Moderately Integrated (07/30/2022)   Social Connection and Isolation Panel [NHANES]    Frequency of Communication with Friends and Family: More than three times a week    Frequency of Social Gatherings with Friends and Family: Three times a week    Attends Religious Services: More than 4 times per year    Active Member of Clubs or Organizations: No    Attends Banker Meetings: Never    Marital Status: Married  Catering manager Violence: Not At Risk (07/30/2022)   Humiliation, Afraid, Rape, and Kick questionnaire    Fear of Current or Ex-Partner: No    Emotionally Abused: No    Physically Abused: No    Sexually  Abused: No    FAMILY HISTORY: Family History  Problem Relation Age of Onset   Heart disease Mother    Hypertension Mother    Sudden death Mother    Hypertension Brother    Aneurysm Paternal Grandmother     ALLERGIES:  has No Known  Allergies.  MEDICATIONS:  Current Outpatient Medications  Medication Sig Dispense Refill   amLODipine (NORVASC) 10 MG tablet Take 1 tablet (10 mg total) by mouth daily. 90 tablet 3   chlorthalidone (HYGROTON) 25 MG tablet Take 1 tablet (25 mg total) by mouth daily. 30 tablet 2   CRESTOR 5 MG tablet Take 5 mg by mouth 2 (two) times a week.     labetalol (NORMODYNE) 200 MG tablet TAKE 1 TABLET(200 MG) BY MOUTH TWICE DAILY 180 tablet 3   losartan (COZAAR) 100 MG tablet Take 1 tablet (100 mg total) by mouth daily. 90 tablet 3   MOUNJARO 12.5 MG/0.5ML Pen Inject 12.5 mg into the skin once a week.     Multiple Vitamins-Minerals (ONE-A-DAY MENS 50+ ADVANTAGE) TABS Take 1 tablet by mouth daily.     POTASSIUM PO Take 1 tablet by mouth daily.     tadalafil (CIALIS) 20 MG tablet TAKE 1 TABLET(20 MG) BY MOUTH DAILY AS NEEDED 30 tablet 3   Vitamin D, Ergocalciferol, (DRISDOL) 1.25 MG (50000 UNIT) CAPS capsule Take 1 capsule (50,000 Units total) by mouth every 7 (seven) days. 8 capsule 0   Ascorbic Acid (VITAMIN C PO) Take 1 tablet by mouth daily. (Patient not taking: Reported on 10/29/2022)     blood glucose meter kit and supplies KIT Dispense based on patient and insurance preference. Check fasting blood sugar once daily ICD10 R73.03 (Patient not taking: Reported on 10/29/2022) 1 each 0   glucose blood test strip Use as instructed to monitor FSBS 1x daily. Dx: R73.09 (Patient not taking: Reported on 10/29/2022) 100 each 12   Lancets (ONETOUCH ULTRASOFT) lancets Use as instructed (Patient not taking: Reported on 10/29/2022) 100 each 12   oxybutynin (DITROPAN) 5 MG tablet Take 1 tablet (5 mg total) by mouth every 8 (eight) hours as needed for bladder spasms. (Patient not  taking: Reported on 10/29/2022) 30 tablet 1   phenazopyridine (PYRIDIUM) 200 MG tablet Take 1 tablet (200 mg total) by mouth 3 (three) times daily as needed (for pain with urination). (Patient not taking: Reported on 10/29/2022) 30 tablet 0   No current facility-administered medications for this visit.    REVIEW OF SYSTEMS:   Constitutional: Denies unexpected weight loss or decreased appetite Respiratory: Denies cough, shortness of breath or wheezes Cardiovascular: Denies palpitation, chest discomfort or lower extremity swelling Gastrointestinal:  Denies nausea, vomiting, abdominal pain, diarrhea, constipation or change in bowel habits GU: Denies any dysuria, hematuria, hesitancy Lymphatics: Denies new lymphadenopathy or easy bruising Neurological: Denies numbness, tingling or new weaknesses Behavioral/Psych: Mood is stable, no new changes  All other systems were reviewed with the patient and are negative.  PHYSICAL EXAMINATION: ECOG PERFORMANCE STATUS: 0 - Asymptomatic  Vitals:   10/29/22 0838 10/29/22 0857  BP: (!) 144/89 128/78  Pulse:    Resp:    Temp:    SpO2:     Filed Weights   10/29/22 0829  Weight: (!) 328 lb 6.4 oz (149 kg)    GENERAL: alert, no distress and comfortable SKIN: skin color is normal, no jaundice, rashes or significant lesions EYES: sclera clear OROPHARYNX: no exudate, no erythema NECK: supple LYMPH:  no palpable lymphadenopathy in the cervical, axillary regions LUNGS: Effort normal, no respiratory distress.  Clear to auscultation bilaterally HEART: regular rate & rhythm and no lower extremity edema ABDOMEN: soft, non-tender and nondistended Musculoskeletal: no point tenderness NEURO: no focal motor/sensory deficits  LABORATORY DATA:  I have reviewed the data as listed Lab Results  Component Value Date   WBC 7.6 10/29/2022   HGB 13.1 10/29/2022   HCT 39.4 10/29/2022   MCV 88.5 10/29/2022   PLT 253 10/29/2022   Recent Labs    08/27/22 1535  10/29/22 1054  NA 142 138  K 3.9 4.0  CL 103 105  CO2 24 27  GLUCOSE 129* 100*  BUN 20 14  CREATININE 1.14 1.15  CALCIUM 9.5 9.8  GFRNONAA  --  >60  PROT 7.2 7.5  ALBUMIN 4.5 4.4  AST 31 23  ALT 39 29  ALKPHOS 53 45  BILITOT 0.6 0.9    RADIOGRAPHIC STUDIES: I have personally reviewed the radiological images as listed and agreed with the findings in the report. No results found.

## 2022-10-29 ENCOUNTER — Inpatient Hospital Stay: Payer: Medicare Other

## 2022-10-29 ENCOUNTER — Telehealth: Payer: Self-pay | Admitting: Student

## 2022-10-29 VITALS — BP 128/78 | HR 75 | Temp 98.5°F | Resp 18 | Ht >= 80 in | Wt 328.4 lb

## 2022-10-29 DIAGNOSIS — Z79899 Other long term (current) drug therapy: Secondary | ICD-10-CM | POA: Insufficient documentation

## 2022-10-29 DIAGNOSIS — Z87891 Personal history of nicotine dependence: Secondary | ICD-10-CM | POA: Diagnosis not present

## 2022-10-29 DIAGNOSIS — E669 Obesity, unspecified: Secondary | ICD-10-CM | POA: Insufficient documentation

## 2022-10-29 DIAGNOSIS — C679 Malignant neoplasm of bladder, unspecified: Secondary | ICD-10-CM

## 2022-10-29 DIAGNOSIS — Z8249 Family history of ischemic heart disease and other diseases of the circulatory system: Secondary | ICD-10-CM | POA: Insufficient documentation

## 2022-10-29 DIAGNOSIS — I252 Old myocardial infarction: Secondary | ICD-10-CM

## 2022-10-29 DIAGNOSIS — E119 Type 2 diabetes mellitus without complications: Secondary | ICD-10-CM

## 2022-10-29 DIAGNOSIS — I10 Essential (primary) hypertension: Secondary | ICD-10-CM | POA: Diagnosis not present

## 2022-10-29 DIAGNOSIS — Z8673 Personal history of transient ischemic attack (TIA), and cerebral infarction without residual deficits: Secondary | ICD-10-CM | POA: Diagnosis not present

## 2022-10-29 DIAGNOSIS — Z8719 Personal history of other diseases of the digestive system: Secondary | ICD-10-CM | POA: Insufficient documentation

## 2022-10-29 DIAGNOSIS — Z9049 Acquired absence of other specified parts of digestive tract: Secondary | ICD-10-CM | POA: Insufficient documentation

## 2022-10-29 DIAGNOSIS — C678 Malignant neoplasm of overlapping sites of bladder: Secondary | ICD-10-CM | POA: Insufficient documentation

## 2022-10-29 DIAGNOSIS — R310 Gross hematuria: Secondary | ICD-10-CM | POA: Diagnosis not present

## 2022-10-29 DIAGNOSIS — G4733 Obstructive sleep apnea (adult) (pediatric): Secondary | ICD-10-CM | POA: Insufficient documentation

## 2022-10-29 LAB — CMP (CANCER CENTER ONLY)
ALT: 29 U/L (ref 0–44)
AST: 23 U/L (ref 15–41)
Albumin: 4.4 g/dL (ref 3.5–5.0)
Alkaline Phosphatase: 45 U/L (ref 38–126)
Anion gap: 6 (ref 5–15)
BUN: 14 mg/dL (ref 6–20)
CO2: 27 mmol/L (ref 22–32)
Calcium: 9.8 mg/dL (ref 8.9–10.3)
Chloride: 105 mmol/L (ref 98–111)
Creatinine: 1.15 mg/dL (ref 0.61–1.24)
GFR, Estimated: >60 mL/min (ref >60)
Glucose, Bld: 100 mg/dL — ABNORMAL HIGH (ref 70–99)
Potassium: 4 mmol/L (ref 3.5–5.1)
Sodium: 138 mmol/L (ref 135–145)
Total Bilirubin: 0.9 mg/dL (ref 0.3–1.2)
Total Protein: 7.5 g/dL (ref 6.5–8.1)

## 2022-10-29 LAB — CBC WITH DIFFERENTIAL (CANCER CENTER ONLY)
Abs Immature Granulocytes: 0.03 10*3/uL (ref 0.00–0.07)
Basophils Absolute: 0.1 10*3/uL (ref 0.0–0.1)
Basophils Relative: 1 %
Eosinophils Absolute: 0.2 10*3/uL (ref 0.0–0.5)
Eosinophils Relative: 3 %
HCT: 39.4 % (ref 39.0–52.0)
Hemoglobin: 13.1 g/dL (ref 13.0–17.0)
Immature Granulocytes: 0 %
Lymphocytes Relative: 22 %
Lymphs Abs: 1.7 10*3/uL (ref 0.7–4.0)
MCH: 29.4 pg (ref 26.0–34.0)
MCHC: 33.2 g/dL (ref 30.0–36.0)
MCV: 88.5 fL (ref 80.0–100.0)
Monocytes Absolute: 0.6 10*3/uL (ref 0.1–1.0)
Monocytes Relative: 8 %
Neutro Abs: 5 10*3/uL (ref 1.7–7.7)
Neutrophils Relative %: 66 %
Platelet Count: 253 10*3/uL (ref 150–400)
RBC: 4.45 MIL/uL (ref 4.22–5.81)
RDW: 13.4 % (ref 11.5–15.5)
WBC Count: 7.6 10*3/uL (ref 4.0–10.5)
nRBC: 0 % (ref 0.0–0.2)

## 2022-10-29 NOTE — Progress Notes (Signed)
START ON PATHWAY REGIMEN - Bladder     A cycle is every 14 days:     Methotrexate      Vinblastine      Doxorubicin      Cisplatin      [id number]   **Always confirm dose/schedule in your pharmacy ordering system**  Patient Characteristics: Pre-Cystectomy or Nonsurgical Candidate, M0 (Clinical Staging), cT2-4a, cN0-1, M0, Cystectomy Eligible, Cisplatin-Based Chemotherapy Indicated with CrCl ? 60 mL/min and Minimal or No Symptoms Therapeutic Status: Pre-Cystectomy or Nonsurgical Candidate, M0 (Clinical Staging) AJCC M Category: cM0 AJCC 8 Stage Grouping: II AJCC T Category: cT2 AJCC N Category: cN0 Intent of Therapy: Curative Intent, Discussed with Patient 

## 2022-10-29 NOTE — Patient Instructions (Signed)
Discussed per NCCN guideline, neoadjuvant therapy with ddMVAC (methotrexate, vinblastine, doxorubicin, and cisplatin) followed by radical cystectomy.  Patient has normal kidney function.  Discussed clinical evidence of neoadjuvant dose dense MVAC with GCSF versus other treatment. SWOG trial of over 300 and patient with muscle invasive disease randomized to radical cystectomy alone versus 3 cycles of neoadjuvant MVAC followed by cystectomy show improvement in median survival of 77 versus 46 months. VESPER trial reported complete pathologic response of 42% versus 36%. PFS at 3 years was significantly higher in the dd-MVAC arm 66% v 56% in GC.   Discussed potential side effects, and schedules.  Patient will need chemotherapy teaching.  Discussed risk of anemia, fatigue, thrombocytopenia, neutropenia, risk of infection, sepsis.  Discussed potential risk of infection, mucositis, electrolyte abnormality from dehydration, neuropathy, hearing loss, renal insufficiency which can lead to other end organ damage.  Discussed need for hospitalization, ED visit with neutropenic fever.  Any signs of infection should be taken seriously.  Report to ED with fever.

## 2022-10-29 NOTE — Telephone Encounter (Signed)
Called patient and confirmed identity. Saw pt had been to oncology today. Discussed current plan of cystectomy and chemo prior to this. States that he is getting used to idea of cystectomy given invasion into muscle of the bladder. Answered questions I could as he was given a lot of information today. Discussed to message or call [facility] if wanting to discuss with me more. Patient appreciative of call.

## 2022-10-30 ENCOUNTER — Other Ambulatory Visit: Payer: Self-pay

## 2022-10-30 ENCOUNTER — Telehealth: Payer: Self-pay

## 2022-10-30 NOTE — Telephone Encounter (Signed)
TC to pt to inform of lab results from yesterday per Dr. Jobie Quaker concerning findings. Pt to call office for any problems or concerns.

## 2022-11-02 ENCOUNTER — Ambulatory Visit (HOSPITAL_COMMUNITY)
Admission: RE | Admit: 2022-11-02 | Discharge: 2022-11-02 | Disposition: A | Discharge status: Home / Self Care | Payer: Medicare Other | Source: Ambulatory Visit

## 2022-11-02 ENCOUNTER — Ambulatory Visit (HOSPITAL_BASED_OUTPATIENT_CLINIC_OR_DEPARTMENT_OTHER): Payer: Medicare Other

## 2022-11-02 DIAGNOSIS — R0681 Apnea, not elsewhere classified: Secondary | ICD-10-CM | POA: Diagnosis not present

## 2022-11-02 DIAGNOSIS — C679 Malignant neoplasm of bladder, unspecified: Secondary | ICD-10-CM

## 2022-11-02 DIAGNOSIS — E785 Hyperlipidemia, unspecified: Secondary | ICD-10-CM | POA: Insufficient documentation

## 2022-11-02 DIAGNOSIS — Z8249 Family history of ischemic heart disease and other diseases of the circulatory system: Secondary | ICD-10-CM | POA: Diagnosis not present

## 2022-11-02 DIAGNOSIS — I252 Old myocardial infarction: Secondary | ICD-10-CM | POA: Diagnosis not present

## 2022-11-02 DIAGNOSIS — Z8673 Personal history of transient ischemic attack (TIA), and cerebral infarction without residual deficits: Secondary | ICD-10-CM | POA: Insufficient documentation

## 2022-11-02 DIAGNOSIS — E669 Obesity, unspecified: Secondary | ICD-10-CM | POA: Insufficient documentation

## 2022-11-02 DIAGNOSIS — E119 Type 2 diabetes mellitus without complications: Secondary | ICD-10-CM | POA: Insufficient documentation

## 2022-11-02 DIAGNOSIS — Z0189 Encounter for other specified special examinations: Secondary | ICD-10-CM

## 2022-11-02 DIAGNOSIS — I119 Hypertensive heart disease without heart failure: Secondary | ICD-10-CM | POA: Diagnosis not present

## 2022-11-02 DIAGNOSIS — Z87891 Personal history of nicotine dependence: Secondary | ICD-10-CM | POA: Insufficient documentation

## 2022-11-02 LAB — ECHOCARDIOGRAM COMPLETE
Area-P 1/2: 4.05 cm2
S' Lateral: 3.4 cm

## 2022-11-02 MED ORDER — IOHEXOL 300 MG/ML  SOLN
75.0000 mL | Freq: Once | INTRAMUSCULAR | Status: AC | PRN
  Administered 2022-11-02: 75 mL via INTRAVENOUS

## 2022-11-04 ENCOUNTER — Other Ambulatory Visit: Payer: Self-pay

## 2022-11-04 DIAGNOSIS — C678 Malignant neoplasm of overlapping sites of bladder: Secondary | ICD-10-CM

## 2022-11-04 NOTE — Progress Notes (Signed)
Fulphila added due to insurance reason.

## 2022-11-07 ENCOUNTER — Other Ambulatory Visit: Payer: Self-pay | Admitting: Radiology

## 2022-11-07 NOTE — Consult Note (Signed)
Chief Complaint: Patient was seen in consultation today for Port-A-Cath placement  Referring Physician(s): Chang,Rubens C  Supervising Physician: Richarda Overlie  Patient Status: [facility] - Out-pt  History of Present Illness: Brad [name] is a 62 y.o. male ex smoker  with past medical history of diabetes, prior stroke, MI, hypertension, parenchymal hemorrhage of brain 2016, obesity, sleep apnea who presents now with newly diagnosed invasive bladder cancer.  He is scheduled today for Port-A-Cath placement to assist with treatment.  Past Medical History:  Diagnosis Date   Abnormal ultrasound of carotid artery 04/2014   no significant obstruction   Ataxia 04/2014   Diabetes (HCC)    EKG abnormalities 04/2014   poor R wave progression, NSR   Former smoker    25 years x 1.5 ppd, stopped 04/2014   Gait disturbance 04/2014   s/p stroke   H/O echocardiogram 04/2014   moderate LVH, 60-65% EF, no valve disease   History of heart attack    Hypertension    Hypertensive heart disease 04/2014   Intraparenchymal hemorrhage of brain [facility]) 05/05/2014   hospitalization [facility]   Myocardial infarction [facility])    Obesity    Prediabetes    Short-term memory loss 04/2014   Sleep apnea    Stroke (HCC) 04/2014    Past Surgical History:  Procedure Laterality Date   APPENDECTOMY     CHOLECYSTECTOMY     CIRCUMCISION     CYSTOSCOPY N/A 09/26/2022   Procedure: CYSTOSCOPY;  Surgeon: Rene Paci, MD;  Location: [facility] ORS;  Service: Urology;  Laterality: N/A;  60 MINUTES   HEMORRHOID SURGERY      Allergies: Patient has no known allergies.  Medications: Prior to Admission medications   Medication Sig Start Date End Date Taking? Authorizing Provider  amLODipine (NORVASC) 10 MG tablet Take 1 tablet (10 mg total) by mouth daily. 12/15/20   Helane Rima, DO  Ascorbic Acid (VITAMIN C PO) Take 1 tablet by mouth daily. Patient not taking: Reported on 10/29/2022    [provider]  blood glucose meter kit and supplies KIT Dispense based on patient and insurance preference. Check fasting blood sugar once daily ICD10 R73.03 Patient not taking: Reported on 10/29/2022 10/11/16   Salley Scarlet, MD  chlorthalidone (HYGROTON) 25 MG tablet Take 1 tablet (25 mg total) by mouth daily. 04/18/21   Helane Rima, DO  CRESTOR 5 MG tablet Take 5 mg by mouth 2 (two) times a week.    [provider]  glucose blood test strip Use as instructed to monitor FSBS 1x daily. Dx: R73.09 Patient not taking: Reported on 10/29/2022 06/07/17   Salley Scarlet, MD  labetalol (NORMODYNE) 200 MG tablet TAKE 1 TABLET(200 MG) BY MOUTH TWICE DAILY 04/18/21   Helane Rima, DO  Lancets [facility] ULTRASOFT) lancets Use as instructed Patient not taking: Reported on 10/29/2022 10/11/16   Salley Scarlet, MD  losartan (COZAAR) 100 MG tablet Take 1 tablet (100 mg total) by mouth daily. 12/15/20   Helane Rima, DO  MOUNJARO 12.5 MG/0.5ML Pen Inject 12.5 mg into the skin once a week. 07/31/22   [provider]  Multiple Vitamins-Minerals (ONE-A-DAY MENS 50+ ADVANTAGE) TABS Take 1 tablet by mouth daily.    [provider]  oxybutynin (DITROPAN) 5 MG tablet Take 1 tablet (5 mg total) by mouth every 8 (eight) hours as needed for bladder spasms. Patient not taking: Reported on 10/29/2022 09/26/22   Rene Paci, MD  phenazopyridine (PYRIDIUM) 200  MG tablet Take 1 tablet (200 mg total) by mouth 3 (three) times daily as needed (for pain with urination). Patient not taking: Reported on 10/29/2022 09/26/22 09/26/23  Rene Paci, MD  POTASSIUM PO Take 1 tablet by mouth daily.    [provider]  tadalafil (CIALIS) 20 MG tablet TAKE 1 TABLET(20 MG) BY MOUTH DAILY AS NEEDED 03/01/21   Helane Rima, DO  Vitamin D, Ergocalciferol, (DRISDOL) 1.25 MG (50000 UNIT) CAPS capsule Take 1 capsule (50,000 Units total) by mouth every 7 (seven) days. 06/12/20    Katha Cabal, DO     Family History  Problem Relation Age of Onset   Heart disease Mother    Hypertension Mother    Sudden death Mother    Hypertension Brother    Aneurysm Paternal Grandmother     Social History   Socioeconomic History   Marital status: Married    Spouse name: Proofreader   Number of children: 2   Years of education: 12   Highest education level: Not on file  Occupational History   Occupation: Retired   Tobacco Use   Smoking status: Former    Current packs/day: 0.00    Average packs/day: 1.5 packs/day for 25.0 years (37.5 ttl pk-yrs)    Types: Cigarettes    Start date: 05/04/1989    Quit date: 05/05/2014    Years since quitting: 8.5    Passive exposure: Past   Smokeless tobacco: Never  Vaping Use   Vaping status: Never Used  Substance and Sexual Activity   Alcohol use: No    Alcohol/week: 0.0 standard drinks of alcohol   Drug use: No   Sexual activity: Not on file  Other Topics Concern   Not on file  Social History Narrative   Lives at home with wife   Married to [name], non denominational, works at [employer] in [place], [place].  From [place], but moved to [place] 2016.     Caffeine use: Drinks tea occass   Social Determinants of Health   Financial Resource Strain: Low Risk  (07/30/2022)   Overall Financial Resource Strain (CARDIA)    Difficulty of Paying Living Expenses: Not hard at all  Food Insecurity: No Food Insecurity (07/30/2022)   Hunger Vital Sign    Worried About Running Out of Food in the Last Year: Never true    Ran Out of Food in the Last Year: Never true  Transportation Needs: No Transportation Needs (07/30/2022)   PRAPARE - Administrator, Civil Service (Medical): No    Lack of Transportation (Non-Medical): No  Physical Activity: Insufficiently Active (07/30/2022)   Exercise Vital Sign    Days of Exercise per Week: 3 days    Minutes of Exercise per Session: 30 min  Stress: No Stress Concern Present (07/30/2022)    [employer] of Occupational Health - Occupational Stress Questionnaire    Feeling of Stress : Not at all  Social Connections: Moderately Integrated (07/30/2022)   Social Connection and Isolation Panel [NHANES]    Frequency of Communication with Friends and Family: More than three times a week    Frequency of Social Gatherings with Friends and Family: Three times a week    Attends Religious Services: More than 4 times per year    Active Member of Clubs or Organizations: No    Attends Banker Meetings: Never    Marital Status: Married      Review of Systems  Vital Signs:   Code  Status:     Physical Exam  Imaging: ECHOCARDIOGRAM COMPLETE  Result Date: 11/02/2022    ECHOCARDIOGRAM REPORT   Patient Name:   Onesimus Fousek  Date of Exam: 11/02/2022 Medical Rec #:  [id number]     Height:       81.0 in Accession #:    [id number]    Weight:       328.4 lb Date of Birth:  11/24/1960     BSA:          2.864 m Patient Age:    62 years      BP:           128/78 mmHg Patient Gender: M             HR:           74 bpm. Exam Location:  [address] Procedure: 2D Echo, Cardiac Doppler and Color Doppler Indications:    Z09 Chemotherapy  History:        Patient has prior history of Echocardiogram examinations.                 Previous Myocardial Infarction, Stroke; Risk Factors:Sleep                 Apnea, Hypertension, Family History of Coronary Artery Disease,                 Diabetes, Dyslipidemia and Former Smoker. Bladder Cancer status                 post Tumor Resection- to begin Chemotherapy, Obesity.  Sonographer:    Farrel Conners RDCS Referring Phys: Michiel Sites CHANG IMPRESSIONS  1. Left ventricular ejection fraction, by estimation, is 60 to 65%. The left ventricle has normal function. The left ventricle has no regional wall motion abnormalities. There is mild concentric left ventricular hypertrophy. Left ventricular diastolic parameters are consistent with Grade I diastolic  dysfunction (impaired relaxation). Elevated left atrial pressure.  2. Right ventricular systolic function is normal. The right ventricular size is normal. Tricuspid regurgitation signal is inadequate for assessing PA pressure.  3. Left atrial size was mildly dilated.  4. Right atrial size was mildly dilated.  5. The mitral valve is normal in structure. Trivial mitral valve regurgitation. No evidence of mitral stenosis.  6. The aortic valve is tricuspid. Aortic valve regurgitation is not visualized. No aortic stenosis is present.  7. The aorta appears dilated even when making allowance for body size. Aortic dilatation noted. There is mild dilatation of the ascending aorta, measuring 44 mm.  8. The inferior vena cava is normal in size with greater than 50% respiratory variability, suggesting right atrial pressure of 3 mmHg. FINDINGS  Left Ventricle: Left ventricular ejection fraction, by estimation, is 60 to 65%. The left ventricle has normal function. The left ventricle has no regional wall motion abnormalities. The left ventricular internal cavity size was normal in size. There is  mild concentric left ventricular hypertrophy. Left ventricular diastolic parameters are consistent with Grade I diastolic dysfunction (impaired relaxation). Elevated left atrial pressure. Right Ventricle: The right ventricular size is normal. No increase in right ventricular wall thickness. Right ventricular systolic function is normal. Tricuspid regurgitation signal is inadequate for assessing PA pressure. Left Atrium: Left atrial size was mildly dilated. Right Atrium: Right atrial size was mildly dilated. Pericardium: There is no evidence of pericardial effusion. Mitral Valve: The mitral valve is normal in structure. Trivial mitral valve regurgitation. No evidence of mitral valve stenosis. Tricuspid  Valve: The tricuspid valve is normal in structure. Tricuspid valve regurgitation is not demonstrated. No evidence of tricuspid stenosis.  Aortic Valve: The aortic valve is tricuspid. Aortic valve regurgitation is not visualized. No aortic stenosis is present. Pulmonic Valve: The pulmonic valve was normal in structure. Pulmonic valve regurgitation is not visualized. No evidence of pulmonic stenosis. Aorta: The aorta appears dilated even when making allowance for body size. Aortic dilatation noted. There is mild dilatation of the ascending aorta, measuring 44 mm. Venous: The inferior vena cava is normal in size with greater than 50% respiratory variability, suggesting right atrial pressure of 3 mmHg. IAS/Shunts: No atrial level shunt detected by color flow Doppler.  LEFT VENTRICLE PLAX 2D LVIDd:         5.10 cm   Diastology LVIDs:         3.40 cm   LV e' medial:    5.84 cm/s LV PW:         1.20 cm   LV E/e' medial:  17.5 LV IVS:        1.20 cm   LV e' lateral:   7.34 cm/s LVOT diam:     2.60 cm   LV E/e' lateral: 13.9 LV SV:         115 LV SV Index:   40 LVOT Area:     5.31 cm  RIGHT VENTRICLE RV Basal diam:  4.30 cm RV Mid diam:    3.20 cm RV S prime:     16.90 cm/s TAPSE (M-mode): 2.8 cm LEFT ATRIUM             Index        RIGHT ATRIUM           Index LA diam:        4.10 cm 1.43 cm/m   RA Pressure: 3.00 mmHg LA Vol (A2C):   68.8 ml 24.02 ml/m  RA Area:     22.30 cm LA Vol (A4C):   81.5 ml 28.45 ml/m  RA Volume:   71.20 ml  24.86 ml/m LA Biplane Vol: 74.9 ml 26.15 ml/m  AORTIC VALVE LVOT Vmax:   106.00 cm/s LVOT Vmean:  71.250 cm/s LVOT VTI:    0.216 m  AORTA Ao Root diam: 4.00 cm Ao Asc diam:  4.40 cm MITRAL VALVE                TRICUSPID VALVE MV Area (PHT): cm          Estimated RAP:  3.00 mmHg MV Decel Time: 188 msec MV E velocity: 102.37 cm/s  SHUNTS MV A velocity: 112.50 cm/s  Systemic VTI:  0.22 m MV E/A ratio:  0.91         Systemic Diam: 2.60 cm Mihai Croitoru MD Electronically signed by Thurmon Fair MD Signature Date/Time: 11/02/2022/11:56:25 AM    Final     Labs:  CBC: Recent Labs    08/27/22 1535 10/29/22 1054  WBC 7.5  7.6  HGB 13.4 13.1  HCT 41.1 39.4  PLT 285 253    COAGS: No results for input(s): "INR", "APTT" in the last 8760 hours.  BMP: Recent Labs    08/27/22 1535 10/29/22 1054  NA 142 138  K 3.9 4.0  CL 103 105  CO2 24 27  GLUCOSE 129* 100*  BUN 20 14  CALCIUM 9.5 9.8  CREATININE 1.14 1.15  GFRNONAA  --  >60    LIVER FUNCTION TESTS: Recent Labs    08/27/22 1535  10/29/22 1054  BILITOT 0.6 0.9  AST 31 23  ALT 39 29  ALKPHOS 53 45  PROT 7.2 7.5  ALBUMIN 4.5 4.4    TUMOR MARKERS: No results for input(s): "AFPTM", "CEA", "CA199", "CHROMGRNA" in the last 8760 hours.  Assessment and Plan: 62 y.o. male ex smoker  with past medical history of diabetes, prior stroke, MI, hypertension, parenchymal hemorrhage of brain 2016, obesity, sleep apnea who presents now with newly diagnosed invasive bladder cancer.  He is scheduled today for Port-A-Cath placement to assist with treatment.Risks and benefits of image guided port-a-catheter placement was discussed with the patient including, but not limited to bleeding, infection, pneumothorax, or fibrin sheath development and need for additional procedures.  All of the patient's questions were answered, patient is agreeable to proceed. Consent signed and in chart.    Thank you for this interesting consult.  I greatly enjoyed meeting Tupac Sunde and look forward to participating in their care.  A copy of this report was sent to the requesting provider on this date.  Electronically Signed: D. Jeananne Rama, PA-C 11/07/2022, 4:35 PM   I spent a total of   25 minutes  in face to face in clinical consultation, greater than 50% of which was counseling/coordinating care for port a cath placement

## 2022-11-08 ENCOUNTER — Other Ambulatory Visit: Payer: Self-pay

## 2022-11-08 ENCOUNTER — Ambulatory Visit (HOSPITAL_COMMUNITY)
Admission: RE | Admit: 2022-11-08 | Discharge: 2022-11-08 | Disposition: A | Discharge status: Home / Self Care | Payer: Medicare Other | Source: Ambulatory Visit

## 2022-11-08 ENCOUNTER — Encounter (HOSPITAL_COMMUNITY): Payer: Self-pay

## 2022-11-08 DIAGNOSIS — G4730 Sleep apnea, unspecified: Secondary | ICD-10-CM | POA: Diagnosis not present

## 2022-11-08 DIAGNOSIS — C679 Malignant neoplasm of bladder, unspecified: Secondary | ICD-10-CM | POA: Diagnosis present

## 2022-11-08 DIAGNOSIS — Z7985 Long-term (current) use of injectable non-insulin antidiabetic drugs: Secondary | ICD-10-CM | POA: Diagnosis not present

## 2022-11-08 DIAGNOSIS — E119 Type 2 diabetes mellitus without complications: Secondary | ICD-10-CM | POA: Insufficient documentation

## 2022-11-08 DIAGNOSIS — Z87891 Personal history of nicotine dependence: Secondary | ICD-10-CM | POA: Insufficient documentation

## 2022-11-08 DIAGNOSIS — Z8673 Personal history of transient ischemic attack (TIA), and cerebral infarction without residual deficits: Secondary | ICD-10-CM | POA: Insufficient documentation

## 2022-11-08 DIAGNOSIS — I252 Old myocardial infarction: Secondary | ICD-10-CM | POA: Insufficient documentation

## 2022-11-08 DIAGNOSIS — I10 Essential (primary) hypertension: Secondary | ICD-10-CM | POA: Insufficient documentation

## 2022-11-08 HISTORY — PX: IR IMAGING GUIDED PORT INSERTION: IMG5740

## 2022-11-08 MED ORDER — HEPARIN SOD (PORK) LOCK FLUSH 100 UNIT/ML IV SOLN
INTRAVENOUS | Status: AC
  Filled 2022-11-08: qty 5

## 2022-11-08 MED ORDER — DEXAMETHASONE 4 MG PO TABS: ORAL_TABLET | ORAL | 1 refills | Status: DC

## 2022-11-08 MED ORDER — LIDOCAINE-EPINEPHRINE 1 %-1:100000 IJ SOLN
INTRAMUSCULAR | Status: AC
  Filled 2022-11-08: qty 1

## 2022-11-08 MED ORDER — MIDAZOLAM HCL 2 MG/2ML IJ SOLN
INTRAMUSCULAR | Status: AC | PRN
Start: 2022-11-08 — End: 2022-11-08
  Administered 2022-11-08: 1 mg via INTRAVENOUS

## 2022-11-08 MED ORDER — LIDOCAINE-EPINEPHRINE 1 %-1:100000 IJ SOLN
20.0000 mL | Freq: Once | INTRAMUSCULAR | Status: AC
  Administered 2022-11-08: 10 mL via INTRADERMAL

## 2022-11-08 MED ORDER — FENTANYL CITRATE (PF) 100 MCG/2ML IJ SOLN
INTRAMUSCULAR | Status: AC | PRN
Start: 2022-11-08 — End: 2022-11-08
  Administered 2022-11-08: 50 ug via INTRAVENOUS

## 2022-11-08 MED ORDER — SODIUM CHLORIDE 0.9 % IV SOLN: INTRAVENOUS | Status: DC

## 2022-11-08 MED ORDER — MIDAZOLAM HCL 2 MG/2ML IJ SOLN
INTRAMUSCULAR | Status: AC
  Filled 2022-11-08: qty 4

## 2022-11-08 MED ORDER — HEPARIN SOD (PORK) LOCK FLUSH 100 UNIT/ML IV SOLN
500.0000 [IU] | Freq: Once | INTRAVENOUS | Status: AC
  Administered 2022-11-08: 500 [IU] via INTRAVENOUS

## 2022-11-08 MED ORDER — LIDOCAINE-PRILOCAINE 2.5-2.5 % EX CREA: TOPICAL_CREAM | CUTANEOUS | 3 refills | Status: DC

## 2022-11-08 MED ORDER — LIDOCAINE HCL 1 % IJ SOLN
20.0000 mL | Freq: Once | INTRAMUSCULAR | Status: AC
  Administered 2022-11-08: 10 mL via INTRADERMAL

## 2022-11-08 MED ORDER — FENTANYL CITRATE (PF) 100 MCG/2ML IJ SOLN
INTRAMUSCULAR | Status: AC
  Filled 2022-11-08: qty 2

## 2022-11-08 MED ORDER — PROCHLORPERAZINE MALEATE 10 MG PO TABS: 10.0000 mg | ORAL_TABLET | Freq: Four times a day (QID) | ORAL | 1 refills | Status: DC | PRN

## 2022-11-08 MED ORDER — ONDANSETRON HCL 8 MG PO TABS: 8.0000 mg | ORAL_TABLET | Freq: Three times a day (TID) | ORAL | 1 refills | Status: DC | PRN

## 2022-11-08 MED ORDER — LIDOCAINE HCL 1 % IJ SOLN
INTRAMUSCULAR | Status: AC
  Filled 2022-11-08: qty 20

## 2022-11-08 NOTE — Addendum Note (Signed)
Addended by: Geanie Berlin on: 11/08/2022 04:20 PM   Modules accepted: Orders

## 2022-11-08 NOTE — Discharge Instructions (Signed)

## 2022-11-08 NOTE — Procedures (Signed)
Interventional Radiology Procedure:   Indications: Bladder cancer  Procedure: Port placement  Findings: Right jugular port, tip at SVC/RA junction  Complications: None     EBL: Minimal, less than 10 ml  Plan: Discharge in one hour.  Keep port site and incisions dry for at least 24 hours.     Ivet Guerrieri R. Emelynn Rance, MD  Pager: [phone]    

## 2022-11-09 ENCOUNTER — Inpatient Hospital Stay: Payer: Medicare Other

## 2022-11-09 ENCOUNTER — Telehealth: Payer: Self-pay

## 2022-11-09 NOTE — Telephone Encounter (Signed)
Called patient's spouse regarding scheduled appointment for patient education and also for future treatment dates/times with Follow up for Dr. Cherly Hensen.

## 2022-11-12 ENCOUNTER — Other Ambulatory Visit: Payer: Medicare Other

## 2022-11-12 ENCOUNTER — Ambulatory Visit: Payer: Medicare Other

## 2022-11-14 ENCOUNTER — Inpatient Hospital Stay: Payer: Medicare Other

## 2022-11-14 NOTE — Progress Notes (Signed)

## 2022-11-19 ENCOUNTER — Inpatient Hospital Stay: Payer: Medicare Other

## 2022-11-19 DIAGNOSIS — Z8673 Personal history of transient ischemic attack (TIA), and cerebral infarction without residual deficits: Secondary | ICD-10-CM | POA: Insufficient documentation

## 2022-11-19 DIAGNOSIS — R310 Gross hematuria: Secondary | ICD-10-CM | POA: Insufficient documentation

## 2022-11-19 DIAGNOSIS — Z5111 Encounter for antineoplastic chemotherapy: Secondary | ICD-10-CM | POA: Insufficient documentation

## 2022-11-19 DIAGNOSIS — I77810 Thoracic aortic ectasia: Secondary | ICD-10-CM | POA: Insufficient documentation

## 2022-11-19 DIAGNOSIS — E119 Type 2 diabetes mellitus without complications: Secondary | ICD-10-CM | POA: Insufficient documentation

## 2022-11-19 DIAGNOSIS — C678 Malignant neoplasm of overlapping sites of bladder: Secondary | ICD-10-CM | POA: Insufficient documentation

## 2022-11-19 DIAGNOSIS — D649 Anemia, unspecified: Secondary | ICD-10-CM | POA: Insufficient documentation

## 2022-11-19 DIAGNOSIS — I252 Old myocardial infarction: Secondary | ICD-10-CM | POA: Insufficient documentation

## 2022-11-19 DIAGNOSIS — Z5189 Encounter for other specified aftercare: Secondary | ICD-10-CM | POA: Insufficient documentation

## 2022-11-19 DIAGNOSIS — I119 Hypertensive heart disease without heart failure: Secondary | ICD-10-CM | POA: Insufficient documentation

## 2022-11-19 DIAGNOSIS — E669 Obesity, unspecified: Secondary | ICD-10-CM | POA: Insufficient documentation

## 2022-11-19 DIAGNOSIS — Z9221 Personal history of antineoplastic chemotherapy: Secondary | ICD-10-CM | POA: Insufficient documentation

## 2022-11-19 DIAGNOSIS — G4733 Obstructive sleep apnea (adult) (pediatric): Secondary | ICD-10-CM | POA: Insufficient documentation

## 2022-11-19 DIAGNOSIS — Z79633 Long term (current) use of mitotic inhibitor: Secondary | ICD-10-CM | POA: Insufficient documentation

## 2022-11-19 DIAGNOSIS — Z79899 Other long term (current) drug therapy: Secondary | ICD-10-CM | POA: Insufficient documentation

## 2022-11-19 DIAGNOSIS — Z87891 Personal history of nicotine dependence: Secondary | ICD-10-CM | POA: Insufficient documentation

## 2022-11-19 DIAGNOSIS — Z8249 Family history of ischemic heart disease and other diseases of the circulatory system: Secondary | ICD-10-CM | POA: Insufficient documentation

## 2022-11-19 DIAGNOSIS — Z79634 Long term (current) use of topoisomerase inhibitor: Secondary | ICD-10-CM | POA: Insufficient documentation

## 2022-11-19 DIAGNOSIS — Z79632 Long term (current) use of antitumor antibiotic: Secondary | ICD-10-CM | POA: Insufficient documentation

## 2022-11-19 DIAGNOSIS — N400 Enlarged prostate without lower urinary tract symptoms: Secondary | ICD-10-CM | POA: Insufficient documentation

## 2022-11-20 ENCOUNTER — Ambulatory Visit: Admit: 2022-11-20 | Payer: Medicare Other | Admitting: Urology

## 2022-11-20 SURGERY — TRANSURETHRAL RESECTION OF BLADDER TUMOR (TURBT)
Anesthesia: General

## 2022-11-21 DIAGNOSIS — Z9189 Other specified personal risk factors, not elsewhere classified: Secondary | ICD-10-CM | POA: Insufficient documentation

## 2022-11-21 NOTE — Assessment & Plan Note (Addendum)
Start ddMVAC today. Plan for 6 cycles or as tolerated. Repeat every 2 weeks as able See me before cycle 2 with lab appointment one hour before visit. Repeat every 2 weeks

## 2022-11-21 NOTE — Progress Notes (Unsigned)
Patient Care Team: Levin Erp, MD as PCP - General (Family Medicine) [name], Deloris Ping, [name], [facility] Melven Sartorius, [name]  Clinic Day:  11/22/2022  Referring physician: Melven Sartorius, MD  ASSESSMENT & PLAN:   Assessment & Plan: Brad [name] is a 62 y.o.male with history of hypertension, CVA, type 2 diabetes, OSA and history of smoking quit 7 years ago prior to diagnosis [facility] muscle invasive bladder cancer.   Current diagnosis: T2 MIBC Urologist: Dr. Cristal Deer Liliane Shi CrCl >60 Current treatment: ddMVAC Methotrexate 30 mg/m2 Vinblastine 3 mg/m2 Doxorubicin 30 mg/m2  Cisplatin 70 mg/m2  Fulphila  Bladder cancer (HCC) Start ddMVAC today. Plan for 6 cycles or as tolerated. Repeat every 2 weeks as able See me before cycle 2 with lab appointment one hour before visit. Repeat every 2 weeks  At risk for side effect of medication Compazine 10 mg twice daily on day 1-3 of each cycle, then as needed Echo at baseline normal Monitor for neutropenic fever and signs of infection Hydration 64 to 70+ ounces per day CBC with differential, CMP 1 day before each cycle Monitored for the hearing loss, neuropathy, renal function closely  Normocytic anemia Lower MCV, will check ferritin.    The patient understands the plans discussed today and is in agreement with them.  He knows to contact our office if he develops concerns prior to his next appointment.   Melven Sartorius, MD  [facility] AT [facility] [address] [place] [address] Dept: [phone] Dept Fax: [phone]   Orders Placed This Encounter  Procedures   CBC with Differential (Cancer Center Only)    Standing Status:   Future    Standing Expiration Date:   12/20/2023   CMP (Cancer Center only)    Standing Status:   Future     Standing Expiration Date:   12/20/2023   Magnesium    Standing Status:   Future    Standing Expiration Date:   12/20/2023   CBC with Differential (Cancer Center Only)    Standing Status:   Future    Standing Expiration Date:   01/03/2024   CMP (Cancer Center only)    Standing Status:   Future    Standing Expiration Date:   01/03/2024   Magnesium    Standing Status:   Future    Standing Expiration Date:   01/03/2024   CBC with Differential (Cancer Center Only)    Standing Status:   Future    Standing Expiration Date:   01/31/2024   CMP (Cancer Center only)    Standing Status:   Future    Standing Expiration Date:   01/31/2024   Magnesium    Standing Status:   Future    Standing Expiration Date:   01/31/2024   CBC with Differential (Cancer Center Only)    Standing Status:   Future    Standing Expiration Date:   01/17/2024   CMP (Cancer Center only)    Standing Status:   Future    Standing Expiration Date:   01/17/2024   Magnesium    Standing Status:   Future    Standing Expiration Date:   01/17/2024      CHIEF COMPLAINT:  CC: Bladder cancer  Current Treatment: Dose dense MVAC  INTERVAL HISTORY:  Brad [name] is here today for repeat clinical assessment. He denies any hematuria  or other new symptoms.  I have reviewed the past medical history, past surgical history, social history and family history with the patient and they are unchanged from previous note.  ALLERGIES:  has No Known Allergies.  MEDICATIONS:  Current Outpatient Medications  Medication Sig Dispense Refill   amLODipine (NORVASC) 10 MG tablet Take 1 tablet (10 mg total) by mouth daily. 90 tablet 3   Ascorbic Acid (VITAMIN C PO) Take 1 tablet by mouth daily.     blood glucose meter kit and supplies KIT Dispense based on patient and insurance preference. Check fasting blood sugar once daily ICD10 R73.03 1 each 0   chlorthalidone (HYGROTON) 25 MG tablet Take 1 tablet (25 mg total) by mouth daily. 30 tablet 2   CRESTOR 5  MG tablet Take 5 mg by mouth 2 (two) times a week.     dexamethasone (DECADRON) 4 MG tablet Take 2 tablets (8 mg) by mouth daily x 3 days starting the day after cisplatin chemotherapy. Take with food. 30 tablet 1   glucose blood test strip Use as instructed to monitor FSBS 1x daily. Dx: R73.09 100 each 12   labetalol (NORMODYNE) 200 MG tablet TAKE 1 TABLET(200 MG) BY MOUTH TWICE DAILY 180 tablet 3   Lancets (ONETOUCH ULTRASOFT) lancets Use as instructed 100 each 12   lidocaine-prilocaine (EMLA) cream Apply to affected area once 30 g 3   losartan (COZAAR) 100 MG tablet Take 1 tablet (100 mg total) by mouth daily. 90 tablet 3   MOUNJARO 12.5 MG/0.5ML Pen Inject 12.5 mg into the skin once a week.     Multiple Vitamins-Minerals (ONE-A-DAY MENS 50+ ADVANTAGE) TABS Take 1 tablet by mouth daily.     oxybutynin (DITROPAN) 5 MG tablet Take 1 tablet (5 mg total) by mouth every 8 (eight) hours as needed for bladder spasms. 30 tablet 1   phenazopyridine (PYRIDIUM) 200 MG tablet Take 1 tablet (200 mg total) by mouth 3 (three) times daily as needed (for pain with urination). 30 tablet 0   POTASSIUM PO Take 1 tablet by mouth daily.     tadalafil (CIALIS) 20 MG tablet TAKE 1 TABLET(20 MG) BY MOUTH DAILY AS NEEDED 30 tablet 3   Vitamin D, Ergocalciferol, (DRISDOL) 1.25 MG (50000 UNIT) CAPS capsule Take 1 capsule (50,000 Units total) by mouth every 7 (seven) days. 8 capsule 0   ondansetron (ZOFRAN) 8 MG tablet Take 1 tablet (8 mg total) by mouth every 8 (eight) hours as needed for nausea or vomiting. Start on the third day after cisplatin. (Patient not taking: Reported on 11/22/2022) 30 tablet 1   prochlorperazine (COMPAZINE) 10 MG tablet Take 1 tablet (10 mg total) by mouth every 6 (six) hours as needed (Nausea or vomiting). (Patient not taking: Reported on 11/22/2022) 30 tablet 1   No current facility-administered medications for this visit.    HISTORY OF PRESENT ILLNESS:   Oncology History  Bladder cancer  (HCC)  08/28/2022 Imaging   1. Contrast enhancing, lobulated endoluminal mass of the anterior bladder dome, measuring 2.6 x 2.0 x 1.2 cm, consistent with primary bladder malignancy. 2. Prominent varices about the bladder dome and anterior bladder, without overt evidence of extramural tumor extent. 3. No evidence of lymphadenopathy or metastatic disease in the abdomen or pelvis. 4. Prostatomegaly.   09/11/2022 Initial Diagnosis   Bladder cancer [facility]) At age 7 year old found to have a solid enhancing 2.5 cm anterior bladder dome mass on CT hematuria protocol on 08/28/2022 during evaluation for gross hematuria.  09/26/2022 Pathology Results   A. BLADDER TUMOR, SUPERFICIAL MARGIN DOME, TURBT:  High grade papillary urothelial carcinoma with inverted growth pattern  The carcinoma invades muscularis propria   B. BLADDER TUMOR, DEEP MARGIN DOME, TURBT:  Invasive high grade papillary urothelial carcinoma  Muscularis propria (detrusor muscle) is present and not involved by  carcinoma   C. BLADDER TUMOR, INFERIOR MARGIN, BIOPSY:  Submucosa and muscularis propria is present and not involved by  carcinoma    09/26/2022 Procedure   1.  Cystoscopy with TURBT (medium) 2.  Intravesical instillation of gemcitabine 3.  Cystogram with intraoperative interpretation of fluoroscopic imaging   11/22/2022 -  Chemotherapy   Patient is on Treatment Plan : BLADDER DOSE DENSE MVAC q14d         REVIEW OF SYSTEMS:   All relevant systems were reviewed with the patient and are negative.   VITALS:  Blood pressure (!) 155/84, pulse 76, temperature 98.4 F (36.9 C), resp. rate 20, weight (!) 326 lb 9.6 oz (148.1 kg), SpO2 100%.  Wt Readings from Last 3 Encounters:  11/22/22 (!) 326 lb 9.6 oz (148.1 kg)  11/08/22 (!) 328 lb 6.4 oz (149 kg)  10/29/22 (!) 328 lb 6.4 oz (149 kg)    Body mass index is 35 kg/m.  Performance status (ECOG): 0 - Asymptomatic  PHYSICAL EXAM:   GENERAL:alert, no distress  and comfortable  LABORATORY DATA:  I have reviewed the data as listed    Component Value Date/Time   NA 140 11/22/2022 1110   NA 142 08/27/2022 1535   K 4.0 11/22/2022 1110   CL 107 11/22/2022 1110   CO2 26 11/22/2022 1110   GLUCOSE 127 (H) 11/22/2022 1110   BUN 19 11/22/2022 1110   BUN 20 08/27/2022 1535   CREATININE 1.19 11/22/2022 1110   CREATININE 1.50 (H) 02/18/2018 1306   CALCIUM 9.3 11/22/2022 1110   PROT 7.1 11/22/2022 1110   PROT 7.2 08/27/2022 1535   ALBUMIN 4.0 11/22/2022 1110   ALBUMIN 4.5 08/27/2022 1535   AST 17 11/22/2022 1110   ALT 25 11/22/2022 1110   ALKPHOS 46 11/22/2022 1110   BILITOT 0.8 11/22/2022 1110   GFRNONAA >60 11/22/2022 1110   GFRAA 75 01/12/2020 1056    No results found for: "SPEP", "UPEP"  Lab Results  Component Value Date   WBC 7.8 11/22/2022   NEUTROABS 5.3 11/22/2022   HGB 12.5 (L) 11/22/2022   HCT 37.0 (L) 11/22/2022   MCV 87.7 11/22/2022   PLT 279 11/22/2022      Chemistry      Component Value Date/Time   NA 140 11/22/2022 1110   NA 142 08/27/2022 1535   K 4.0 11/22/2022 1110   CL 107 11/22/2022 1110   CO2 26 11/22/2022 1110   BUN 19 11/22/2022 1110   BUN 20 08/27/2022 1535   CREATININE 1.19 11/22/2022 1110   CREATININE 1.50 (H) 02/18/2018 1306      Component Value Date/Time   CALCIUM 9.3 11/22/2022 1110   ALKPHOS 46 11/22/2022 1110   AST 17 11/22/2022 1110   ALT 25 11/22/2022 1110   BILITOT 0.8 11/22/2022 1110       RADIOGRAPHIC STUDIES: I have personally reviewed the radiological images as listed and agreed with the findings in the report. IR IMAGING GUIDED PORT INSERTION  Result Date: 11/08/2022 INDICATION: 62 year old with bladder cancer.  Port-A-Cath needed for treatment. EXAM: FLUOROSCOPIC AND ULTRASOUND GUIDED PLACEMENT OF A SUBCUTANEOUS PORT COMPARISON:  None Available. MEDICATIONS: Moderate sedation ANESTHESIA/SEDATION:  Moderate (conscious) sedation was employed during this procedure. A total of Versed  2 mg and fentanyl 100 mcg was administered intravenously at the order of the provider performing the procedure. Total intra-service moderate sedation time: 28 minutes. Patient's level of consciousness and vital signs were monitored continuously by radiology nurse throughout the procedure under the supervision of the provider performing the procedure. FLUOROSCOPY TIME:  Radiation Exposure Index (as provided by the fluoroscopic device): 6 mGy Kerma COMPLICATIONS: None immediate. PROCEDURE: The procedure, risks, benefits, and alternatives were explained to the patient. Questions regarding the procedure were encouraged and answered. The patient understands and consents to the procedure. Patient was placed supine on the interventional table. Ultrasound confirmed a patent right internal jugular vein. Ultrasound image was saved for documentation. The right chest and neck were cleaned with a skin antiseptic and a sterile drape was placed. Maximal barrier sterile technique was utilized including caps, mask, sterile gowns, sterile gloves, sterile drape, hand hygiene and skin antiseptic. The right neck was anesthetized with 1% lidocaine. Small incision was made in the right neck with a blade. Micropuncture set was placed in the right internal jugular vein with ultrasound guidance. The micropuncture wire was used for measurement purposes. The right chest was anesthetized with 1% lidocaine with epinephrine. #15 blade was used to make an incision and a subcutaneous port pocket was formed. 8 french Power Port was assembled. Subcutaneous tunnel was formed with a stiff tunneling device. The port catheter was brought through the subcutaneous tunnel. The port was placed in the subcutaneous pocket. The micropuncture set was exchanged for a peel-away sheath. The catheter was placed through the peel-away sheath and the tip was positioned at the superior cavoatrial junction. Catheter placement was confirmed with fluoroscopy. The port was  accessed and flushed with heparinized saline. The port pocket was closed using two layers of absorbable sutures and Dermabond. The vein skin site was closed using a single layer of absorbable suture and Dermabond. Sterile dressings were applied. Patient tolerated the procedure well without an immediate complication. Ultrasound and fluoroscopic images were taken and saved for this procedure. IMPRESSION: Placement of a subcutaneous power-injectable port device. Catheter tip at the superior cavoatrial junction. Electronically Signed   By: Richarda Overlie M.D.   On: 11/08/2022 14:35   ECHOCARDIOGRAM COMPLETE  Result Date: 11/02/2022    ECHOCARDIOGRAM REPORT   Patient Name:   Brad [name]  Date of Exam: 11/02/2022 Medical Rec #:  [id number]     Height:       81.0 in Accession #:    [id number]    Weight:       328.4 lb Date of Birth:  05/25/1960     BSA:          2.864 m Patient Age:    62 years      BP:           128/78 mmHg Patient Gender: M             HR:           74 bpm. Exam Location:  [address] Procedure: 2D Echo, Cardiac Doppler and Color Doppler Indications:    Z09 Chemotherapy  History:        Patient has prior history of Echocardiogram examinations.                 Previous Myocardial Infarction, Stroke; Risk Factors:Sleep                 Apnea, Hypertension,  Family History of Coronary Artery Disease,                 Diabetes, Dyslipidemia and Former Smoker. Bladder Cancer status                 post Tumor Resection- to begin Chemotherapy, Obesity.  Sonographer:    Farrel Conners RDCS Referring Phys: Michiel Sites Jatavia Keltner IMPRESSIONS  1. Left ventricular ejection fraction, by estimation, is 60 to 65%. The left ventricle has normal function. The left ventricle has no regional wall motion abnormalities. There is mild concentric left ventricular hypertrophy. Left ventricular diastolic parameters are consistent with Grade I diastolic dysfunction (impaired relaxation). Elevated left atrial pressure.  2. Right  ventricular systolic function is normal. The right ventricular size is normal. Tricuspid regurgitation signal is inadequate for assessing PA pressure.  3. Left atrial size was mildly dilated.  4. Right atrial size was mildly dilated.  5. The mitral valve is normal in structure. Trivial mitral valve regurgitation. No evidence of mitral stenosis.  6. The aortic valve is tricuspid. Aortic valve regurgitation is not visualized. No aortic stenosis is present.  7. The aorta appears dilated even when making allowance for body size. Aortic dilatation noted. There is mild dilatation of the ascending aorta, measuring 44 mm.  8. The inferior vena cava is normal in size with greater than 50% respiratory variability, suggesting right atrial pressure of 3 mmHg. FINDINGS  Left Ventricle: Left ventricular ejection fraction, by estimation, is 60 to 65%. The left ventricle has normal function. The left ventricle has no regional wall motion abnormalities. The left ventricular internal cavity size was normal in size. There is  mild concentric left ventricular hypertrophy. Left ventricular diastolic parameters are consistent with Grade I diastolic dysfunction (impaired relaxation). Elevated left atrial pressure. Right Ventricle: The right ventricular size is normal. No increase in right ventricular wall thickness. Right ventricular systolic function is normal. Tricuspid regurgitation signal is inadequate for assessing PA pressure. Left Atrium: Left atrial size was mildly dilated. Right Atrium: Right atrial size was mildly dilated. Pericardium: There is no evidence of pericardial effusion. Mitral Valve: The mitral valve is normal in structure. Trivial mitral valve regurgitation. No evidence of mitral valve stenosis. Tricuspid Valve: The tricuspid valve is normal in structure. Tricuspid valve regurgitation is not demonstrated. No evidence of tricuspid stenosis. Aortic Valve: The aortic valve is tricuspid. Aortic valve regurgitation is not  visualized. No aortic stenosis is present. Pulmonic Valve: The pulmonic valve was normal in structure. Pulmonic valve regurgitation is not visualized. No evidence of pulmonic stenosis. Aorta: The aorta appears dilated even when making allowance for body size. Aortic dilatation noted. There is mild dilatation of the ascending aorta, measuring 44 mm. Venous: The inferior vena cava is normal in size with greater than 50% respiratory variability, suggesting right atrial pressure of 3 mmHg. IAS/Shunts: No atrial level shunt detected by color flow Doppler.  LEFT VENTRICLE PLAX 2D LVIDd:         5.10 cm   Diastology LVIDs:         3.40 cm   LV e' medial:    5.84 cm/s LV PW:         1.20 cm   LV E/e' medial:  17.5 LV IVS:        1.20 cm   LV e' lateral:   7.34 cm/s LVOT diam:     2.60 cm   LV E/e' lateral: 13.9 LV SV:  115 LV SV Index:   40 LVOT Area:     5.31 cm  RIGHT VENTRICLE RV Basal diam:  4.30 cm RV Mid diam:    3.20 cm RV S prime:     16.90 cm/s TAPSE (M-mode): 2.8 cm LEFT ATRIUM             Index        RIGHT ATRIUM           Index LA diam:        4.10 cm 1.43 cm/m   RA Pressure: 3.00 mmHg LA Vol (A2C):   68.8 ml 24.02 ml/m  RA Area:     22.30 cm LA Vol (A4C):   81.5 ml 28.45 ml/m  RA Volume:   71.20 ml  24.86 ml/m LA Biplane Vol: 74.9 ml 26.15 ml/m  AORTIC VALVE LVOT Vmax:   106.00 cm/s LVOT Vmean:  71.250 cm/s LVOT VTI:    0.216 m  AORTA Ao Root diam: 4.00 cm Ao Asc diam:  4.40 cm MITRAL VALVE                TRICUSPID VALVE MV Area (PHT): cm          Estimated RAP:  3.00 mmHg MV Decel Time: 188 msec MV E velocity: 102.37 cm/s  SHUNTS MV A velocity: 112.50 cm/s  Systemic VTI:  0.22 m MV E/A ratio:  0.91         Systemic Diam: 2.60 cm Rachelle Hora Croitoru MD Electronically signed by Thurmon Fair MD Signature Date/Time: 11/02/2022/11:56:25 AM    Final

## 2022-11-21 NOTE — Assessment & Plan Note (Signed)
Echo at baseline normal Monitor for neutropenic fever and signs of infection Hydration 64 to 70+ ounces per day CBC with differential, CMP 1 day before each cycle Monitored for the hearing loss, neuropathy, renal function closely

## 2022-11-22 ENCOUNTER — Inpatient Hospital Stay: Payer: Medicare Other

## 2022-11-22 ENCOUNTER — Other Ambulatory Visit: Payer: Self-pay

## 2022-11-22 ENCOUNTER — Inpatient Hospital Stay (HOSPITAL_BASED_OUTPATIENT_CLINIC_OR_DEPARTMENT_OTHER): Payer: Medicare Other

## 2022-11-22 VITALS — BP 155/84 | HR 76 | Temp 98.4°F | Resp 20 | Wt 326.6 lb

## 2022-11-22 VITALS — BP 137/75 | HR 66 | Temp 98.6°F | Resp 18

## 2022-11-22 DIAGNOSIS — I119 Hypertensive heart disease without heart failure: Secondary | ICD-10-CM | POA: Diagnosis not present

## 2022-11-22 DIAGNOSIS — R310 Gross hematuria: Secondary | ICD-10-CM | POA: Diagnosis not present

## 2022-11-22 DIAGNOSIS — C678 Malignant neoplasm of overlapping sites of bladder: Secondary | ICD-10-CM

## 2022-11-22 DIAGNOSIS — I77810 Thoracic aortic ectasia: Secondary | ICD-10-CM | POA: Diagnosis not present

## 2022-11-22 DIAGNOSIS — Z79633 Long term (current) use of mitotic inhibitor: Secondary | ICD-10-CM | POA: Diagnosis not present

## 2022-11-22 DIAGNOSIS — Z5189 Encounter for other specified aftercare: Secondary | ICD-10-CM | POA: Diagnosis not present

## 2022-11-22 DIAGNOSIS — Z79632 Long term (current) use of antitumor antibiotic: Secondary | ICD-10-CM | POA: Diagnosis not present

## 2022-11-22 DIAGNOSIS — D649 Anemia, unspecified: Secondary | ICD-10-CM | POA: Diagnosis not present

## 2022-11-22 DIAGNOSIS — Z9189 Other specified personal risk factors, not elsewhere classified: Secondary | ICD-10-CM | POA: Diagnosis not present

## 2022-11-22 DIAGNOSIS — N400 Enlarged prostate without lower urinary tract symptoms: Secondary | ICD-10-CM | POA: Diagnosis not present

## 2022-11-22 DIAGNOSIS — E119 Type 2 diabetes mellitus without complications: Secondary | ICD-10-CM | POA: Diagnosis not present

## 2022-11-22 DIAGNOSIS — E669 Obesity, unspecified: Secondary | ICD-10-CM | POA: Diagnosis not present

## 2022-11-22 DIAGNOSIS — Z8673 Personal history of transient ischemic attack (TIA), and cerebral infarction without residual deficits: Secondary | ICD-10-CM | POA: Diagnosis not present

## 2022-11-22 DIAGNOSIS — I252 Old myocardial infarction: Secondary | ICD-10-CM | POA: Diagnosis not present

## 2022-11-22 DIAGNOSIS — Z79634 Long term (current) use of topoisomerase inhibitor: Secondary | ICD-10-CM | POA: Diagnosis not present

## 2022-11-22 DIAGNOSIS — Z9221 Personal history of antineoplastic chemotherapy: Secondary | ICD-10-CM | POA: Diagnosis not present

## 2022-11-22 DIAGNOSIS — Z5111 Encounter for antineoplastic chemotherapy: Secondary | ICD-10-CM | POA: Diagnosis present

## 2022-11-22 DIAGNOSIS — Z79899 Other long term (current) drug therapy: Secondary | ICD-10-CM | POA: Diagnosis not present

## 2022-11-22 DIAGNOSIS — Z8249 Family history of ischemic heart disease and other diseases of the circulatory system: Secondary | ICD-10-CM | POA: Diagnosis not present

## 2022-11-22 DIAGNOSIS — Z87891 Personal history of nicotine dependence: Secondary | ICD-10-CM | POA: Diagnosis not present

## 2022-11-22 DIAGNOSIS — G4733 Obstructive sleep apnea (adult) (pediatric): Secondary | ICD-10-CM | POA: Diagnosis not present

## 2022-11-22 LAB — CBC WITH DIFFERENTIAL (CANCER CENTER ONLY)
Abs Immature Granulocytes: 0.02 10*3/uL (ref 0.00–0.07)
Basophils Absolute: 0 10*3/uL (ref 0.0–0.1)
Basophils Relative: 0 %
Eosinophils Absolute: 0.2 10*3/uL (ref 0.0–0.5)
Eosinophils Relative: 3 %
HCT: 37 % — ABNORMAL LOW (ref 39.0–52.0)
Hemoglobin: 12.5 g/dL — ABNORMAL LOW (ref 13.0–17.0)
Immature Granulocytes: 0 %
Lymphocytes Relative: 20 %
Lymphs Abs: 1.6 10*3/uL (ref 0.7–4.0)
MCH: 29.6 pg (ref 26.0–34.0)
MCHC: 33.8 g/dL (ref 30.0–36.0)
MCV: 87.7 fL (ref 80.0–100.0)
Monocytes Absolute: 0.7 10*3/uL (ref 0.1–1.0)
Monocytes Relative: 9 %
Neutro Abs: 5.3 10*3/uL (ref 1.7–7.7)
Neutrophils Relative %: 68 %
Platelet Count: 279 10*3/uL (ref 150–400)
RBC: 4.22 MIL/uL (ref 4.22–5.81)
RDW: 13.2 % (ref 11.5–15.5)
WBC Count: 7.8 10*3/uL (ref 4.0–10.5)
nRBC: 0 % (ref 0.0–0.2)

## 2022-11-22 LAB — CMP (CANCER CENTER ONLY)
ALT: 25 U/L (ref 0–44)
AST: 17 U/L (ref 15–41)
Albumin: 4 g/dL (ref 3.5–5.0)
Alkaline Phosphatase: 46 U/L (ref 38–126)
Anion gap: 7 (ref 5–15)
BUN: 19 mg/dL (ref 6–20)
CO2: 26 mmol/L (ref 22–32)
Calcium: 9.3 mg/dL (ref 8.9–10.3)
Chloride: 107 mmol/L (ref 98–111)
Creatinine: 1.19 mg/dL (ref 0.61–1.24)
GFR, Estimated: >60 mL/min (ref >60)
Glucose, Bld: 127 mg/dL — ABNORMAL HIGH (ref 70–99)
Potassium: 4 mmol/L (ref 3.5–5.1)
Sodium: 140 mmol/L (ref 135–145)
Total Bilirubin: 0.8 mg/dL (ref 0.3–1.2)
Total Protein: 7.1 g/dL (ref 6.5–8.1)

## 2022-11-22 LAB — FERRITIN: Ferritin: 85 ng/mL (ref 24–336)

## 2022-11-22 LAB — MAGNESIUM: Magnesium: 2.1 mg/dL (ref 1.7–2.4)

## 2022-11-22 MED ORDER — ONDANSETRON HCL 4 MG/2ML IJ SOLN
8.0000 mg | Freq: Once | INTRAMUSCULAR | Status: AC
Start: 2022-11-22 — End: 2022-11-22
  Administered 2022-11-22: 8 mg via INTRAVENOUS
  Filled 2022-11-22: qty 4

## 2022-11-22 MED ORDER — HEPARIN SOD (PORK) LOCK FLUSH 100 UNIT/ML IV SOLN
500.0000 [IU] | Freq: Once | INTRAVENOUS | Status: AC | PRN
  Administered 2022-11-22: 500 [IU]

## 2022-11-22 MED ORDER — SODIUM CHLORIDE 0.9 % IV SOLN: Freq: Once | INTRAVENOUS | Status: AC

## 2022-11-22 MED ORDER — METHOTREXATE SODIUM CHEMO INJECTION (PF) 50 MG/2ML
30.0000 mg/m2 | Freq: Once | INTRAMUSCULAR | Status: AC
Start: 2022-11-22 — End: 2022-11-22
  Administered 2022-11-22: 87.5 mg via INTRAVENOUS
  Filled 2022-11-22: qty 3.5

## 2022-11-22 MED ORDER — DEXAMETHASONE SODIUM PHOSPHATE 10 MG/ML IJ SOLN
10.0000 mg | Freq: Once | INTRAMUSCULAR | Status: AC
  Administered 2022-11-22: 10 mg via INTRAVENOUS
  Filled 2022-11-22: qty 1

## 2022-11-22 MED ORDER — SODIUM CHLORIDE 0.9% FLUSH
10.0000 mL | Freq: Once | INTRAVENOUS | Status: AC
  Administered 2022-11-22: 10 mL

## 2022-11-22 MED ORDER — SODIUM CHLORIDE 0.9% FLUSH
10.0000 mL | INTRAVENOUS | Status: DC | PRN
  Administered 2022-11-22: 10 mL

## 2022-11-22 MED FILL — Fosaprepitant Dimeglumine For IV Infusion 150 MG (Base Eq): INTRAVENOUS | Qty: 5 | Status: AC

## 2022-11-22 NOTE — Patient Instructions (Signed)
[facility]  Discharge Instructions: Thank you for choosing [facility] to provide your oncology and hematology care.   If you have a lab appointment with the Cancer Center, please go directly to the Cancer Center and check in at the registration area.   Wear comfortable clothing and clothing appropriate for easy access to any Portacath or PICC line.   We strive to give you quality time with your provider. You may need to reschedule your appointment if you arrive late (15 or more minutes).  Arriving late affects you and other patients whose appointments are after yours.  Also, if you miss three or more appointments without notifying the office, you may be dismissed from the clinic at the provider's discretion.      For prescription refill requests, have your pharmacy contact our office and allow 72 hours for refills to be completed.    Today you received the following chemotherapy and/or immunotherapy agents: Methotrexate      To help prevent nausea and vomiting after your treatment, we encourage you to take your nausea medication as directed.  BELOW ARE SYMPTOMS THAT SHOULD BE REPORTED IMMEDIATELY: *FEVER GREATER THAN 100.4 F (38 C) OR HIGHER *CHILLS OR SWEATING *NAUSEA AND VOMITING THAT IS NOT CONTROLLED WITH YOUR NAUSEA MEDICATION *UNUSUAL SHORTNESS OF BREATH *UNUSUAL BRUISING OR BLEEDING *URINARY PROBLEMS (pain or burning when urinating, or frequent urination) *BOWEL PROBLEMS (unusual diarrhea, constipation, pain near the anus) TENDERNESS IN MOUTH AND THROAT WITH OR WITHOUT PRESENCE OF ULCERS (sore throat, sores in mouth, or a toothache) UNUSUAL RASH, SWELLING OR PAIN  UNUSUAL VAGINAL DISCHARGE OR ITCHING   Items with * indicate a potential emergency and should be followed up as soon as possible or go to the Emergency Department if any problems should occur.  Please show the CHEMOTHERAPY ALERT CARD or IMMUNOTHERAPY ALERT CARD at  check-in to the Emergency Department and triage nurse.  Should you have questions after your visit or need to cancel or reschedule your appointment, please contact [facility] [facility]  Dept: [phone]  and follow the prompts.  Office hours are 8:00 a.m. to 4:30 p.m. Monday - Friday. Please note that voicemails left after 4:00 p.m. may not be returned until the following business day.  We are closed weekends and major holidays. You have access to a nurse at all times for urgent questions. Please call the main number to the clinic Dept: [phone] and follow the prompts.   For any non-urgent questions, you may also contact your provider using MyChart. We now offer e-Visits for anyone 61 and older to request care online for non-urgent symptoms. For details visit [email or web address].   Also download the MyChart app! Go to the app store, search "MyChart", open the app, select [place], and log in with your MyChart username and password.  Methotrexate Injection What is this medication? METHOTREXATE (METH oh TREX ate) treats inflammatory conditions such as arthritis and psoriasis. It works by decreasing inflammation, which can reduce pain and prevent long-term injury to the joints and skin. It may also be used to treat some types of cancer. It works by slowing down the growth of cancer cells. This medicine may be used for other purposes; ask your health care provider or pharmacist if you have questions. What should I tell my care team before I take this medication? They need to know if you have any of these conditions: Fluid in the stomach area or  lungs Frequently drink alcohol Infection or immune system problems Kidney disease Liver disease Low blood counts (Mose cells, platelets, or red blood cells) Lung disease Recent or ongoing radiation Recent or upcoming vaccine Stomach ulcers Ulcerative colitis An unusual or allergic reaction to methotrexate, other  medications, foods, dyes, or preservatives Pregnant or trying to get pregnant Breastfeeding How should I use this medication? This medication is for infusion into a vein or for injection into muscle or into the spinal fluid (whichever applies). It is usually given in a hospital or clinic setting. In rare cases, you might get this medication at home. You will be taught how to give this medication. Use exactly as directed. Take your medication at regular intervals. Do not take your medication more often than directed. If this medication is used for arthritis or psoriasis, it should be taken weekly, NOT daily. It is important that you put your used needles and syringes in a special sharps container. Do not put them in a trash can. If you do not have a sharps container, call your pharmacist or care team to get one. Talk to your care team about the use of this medication in children. While this medication may be prescribed for children as young as 2 years for selected conditions, precautions do apply. Overdosage: If you think you have taken too much of this medicine contact a poison control center or emergency room at once. NOTE: This medicine is only for you. Do not share this medicine with others. What if I miss a dose? It is important not to miss your dose. Call your care team if you are unable to keep an appointment. If you give yourself the medication, and you miss a dose, talk with your care team. Do not take double or extra doses. What may interact with this medication? Do not take this medication with any of the following: Acitretin Probenecid This medication may also interact with the following: Aspirin or aspirin-like medications Azathioprine Certain antibiotics, such as gentamicin, penicillin, tetracycline, vancomycin Certain medications that treat or prevent blood clots, such as warfarin, apixaban, dabigatran, rivaroxaban Certain medications for stomach problems, such as esomeprazole,  omeprazole, pantoprazole Dapsone Hydroxychloroquine Live virus vaccines Medications for viral infections, such as acyclovir, cidofovir, foscarnet, ganciclovir Mercaptopurine NSAIDs, medications for pain and inflammation, such as ibuprofen or naproxen Phenytoin Pyrimethamine Retinoids, such as isotretinoin or tretinoin Sulfonamides, such as sulfasalazine or trimethoprim; sulfamethoxazole Theophylline This list may not describe all possible interactions. Give your health care provider a list of all the medicines, herbs, non-prescription drugs, or dietary supplements you use. Also tell them if you smoke, drink alcohol, or use illegal drugs. Some items may interact with your medicine. What should I watch for while using this medication? This medication may make you feel generally unwell. This is not uncommon as chemotherapy can affect healthy cells as well as cancer cells. Report any side effects. Continue your course of treatment even though you feel ill unless your care team tells you to stop. Your condition will be monitored carefully while you are receiving this medication. Avoid alcoholic drinks. This medication can cause serious side effects. To reduce the risk, your care team may give you other medications to take before receiving this one. Be sure to follow the directions from your care team. This medication can make you more sensitive to the sun. Keep out of the sun. If you cannot avoid being in the sun, wear protective clothing and use sunscreen. Do not use sun lamps or tanning beds/booths.  You may get drowsy or dizzy. Do not drive, use machinery, or do anything that needs mental alertness until you know how this medication affects you. Do not stand or sit up quickly, especially if you are an older patient. This reduces the risk of dizzy or fainting spells. You may need blood work while you are taking this medication. Call your care team for advice if you get a fever, chills or sore  throat, or other symptoms of a cold or flu. Do not treat yourself. This medication decreases your body's ability to fight infections. Try to avoid being around people who are sick. This medication may increase your risk to bruise or bleed. Call your care team if you notice any unusual bleeding. Be careful brushing or flossing your teeth or using a toothpick because you may get an infection or bleed more easily. If you have any dental work done, tell your dentist you are receiving this medication Check with your care team if you get an attack of severe diarrhea, nausea and vomiting, or if you sweat a lot. The loss of too much body fluid can make it dangerous for you to take this medication. Talk to your care team about your risk of cancer. You may be more at risk for certain types of cancers if you take this medication. Do not become pregnant while taking this medication or for 6 months after stopping it. Women should inform their care team if they wish to become pregnant or think they might be pregnant. Men should not father a child while taking this medication and for 3 months after stopping it. There is potential for serious harm to an unborn child. Talk to your care team for more information. Do not breast-feed an infant while taking this medication or for 1 week after stopping it. This medication may make it more difficult to get pregnant or father a child. Talk to your care team if you are concerned about your fertility. What side effects may I notice from receiving this medication? Side effects that you should report to your care team as soon as possible: Allergic reactions--skin rash, itching, hives, swelling of the face, lips, tongue, or throat Blood clot--pain, swelling, or warmth in the leg, shortness of breath, chest pain Dry cough, shortness of breath or trouble breathing Infection--fever, chills, cough, sore throat, wounds that don't heal, pain or trouble when passing urine, general feeling  of discomfort or being unwell Kidney injury--decrease in the amount of urine, swelling of the ankles, hands, or feet Liver injury--right upper belly pain, loss of appetite, nausea, light-colored stool, dark yellow or brown urine, yellowing of the skin or eyes, unusual weakness or fatigue Low red blood cell count--unusual weakness or fatigue, dizziness, headache, trouble breathing Redness, blistering, peeling, or loosening of the skin, including inside the mouth Seizures Unusual bruising or bleeding Side effects that usually do not require medical attention (report to your care team if they continue or are bothersome): Diarrhea Dizziness Hair loss Nausea Pain, redness, or swelling with sores inside the mouth or throat Vomiting This list may not describe all possible side effects. Call your doctor for medical advice about side effects. You may report side effects to FDA at 1-800-FDA-1088. Where should I keep my medication? This medication is given in a hospital or clinic. It will not be stored at home. NOTE: This sheet is a summary. It may not cover all possible information. If you have questions about this medicine, talk to your doctor, pharmacist, or health  care provider.  2024 Elsevier/Gold Standard (2022-06-20 00:00:00)

## 2022-11-22 NOTE — Assessment & Plan Note (Signed)
Lower MCV, will check ferritin.

## 2022-11-23 ENCOUNTER — Telehealth: Payer: Self-pay

## 2022-11-23 ENCOUNTER — Other Ambulatory Visit: Payer: Self-pay

## 2022-11-23 ENCOUNTER — Inpatient Hospital Stay: Payer: Medicare Other

## 2022-11-23 VITALS — BP 150/77 | HR 76 | Temp 97.9°F | Resp 18 | Wt 329.1 lb

## 2022-11-23 DIAGNOSIS — C678 Malignant neoplasm of overlapping sites of bladder: Secondary | ICD-10-CM

## 2022-11-23 DIAGNOSIS — Z5111 Encounter for antineoplastic chemotherapy: Secondary | ICD-10-CM | POA: Diagnosis not present

## 2022-11-23 MED ORDER — DOXORUBICIN HCL CHEMO IV INJECTION 2 MG/ML
30.0000 mg/m2 | Freq: Once | INTRAVENOUS | Status: AC
  Administered 2022-11-23: 88 mg via INTRAVENOUS
  Filled 2022-11-23: qty 44

## 2022-11-23 MED ORDER — DEXAMETHASONE SODIUM PHOSPHATE 10 MG/ML IJ SOLN
10.0000 mg | Freq: Once | INTRAMUSCULAR | Status: AC
Start: 2022-11-23 — End: 2022-11-23
  Administered 2022-11-23: 10 mg via INTRAVENOUS
  Filled 2022-11-23: qty 1

## 2022-11-23 MED ORDER — MAGNESIUM SULFATE 2 GM/50ML IV SOLN
2.0000 g | Freq: Once | INTRAVENOUS | Status: AC
  Administered 2022-11-23: 2 g via INTRAVENOUS
  Filled 2022-11-23: qty 50

## 2022-11-23 MED ORDER — FOSAPREPITANT DIMEGLUMINE INJECTION 150 MG
150.0000 mg | Freq: Once | INTRAVENOUS | Status: AC
  Administered 2022-11-23: 150 mg via INTRAVENOUS
  Filled 2022-11-23: qty 150

## 2022-11-23 MED ORDER — SODIUM CHLORIDE 0.9% FLUSH
10.0000 mL | INTRAVENOUS | Status: DC | PRN
  Administered 2022-11-23: 10 mL

## 2022-11-23 MED ORDER — SODIUM CHLORIDE 0.9 % IV SOLN
70.0000 mg/m2 | Freq: Once | INTRAVENOUS | Status: AC
  Administered 2022-11-23: 200 mg via INTRAVENOUS
  Filled 2022-11-23: qty 200

## 2022-11-23 MED ORDER — HEPARIN SOD (PORK) LOCK FLUSH 100 UNIT/ML IV SOLN
500.0000 [IU] | Freq: Once | INTRAVENOUS | Status: AC | PRN
  Administered 2022-11-23: 500 [IU]

## 2022-11-23 MED ORDER — SODIUM CHLORIDE 0.9 % IV SOLN: Freq: Once | INTRAVENOUS | Status: AC

## 2022-11-23 MED ORDER — PALONOSETRON HCL INJECTION 0.25 MG/5ML
0.2500 mg | Freq: Once | INTRAVENOUS | Status: AC
Start: 2022-11-23 — End: 2022-11-23
  Administered 2022-11-23: 0.25 mg via INTRAVENOUS
  Filled 2022-11-23: qty 5

## 2022-11-23 MED ORDER — POTASSIUM CHLORIDE IN NACL 20-0.9 MEQ/L-% IV SOLN: Freq: Once | INTRAVENOUS | Status: AC

## 2022-11-23 MED ORDER — VINBLASTINE SULFATE CHEMO INJECTION 1 MG/ML
3.0000 mg/m2 | Freq: Once | INTRAVENOUS | Status: AC
  Administered 2022-11-23: 8.8 mg via INTRAVENOUS
  Filled 2022-11-23: qty 8.8

## 2022-11-23 NOTE — Telephone Encounter (Signed)
ENCOUNTER OPENED IN ERROR

## 2022-11-23 NOTE — Patient Instructions (Signed)
[place] [facility]  Discharge Instructions: Thank you for choosing [facility] to provide your oncology and hematology care.   If you have a lab appointment with the Cancer Center, please go directly to the Cancer Center and check in at the registration area.   Wear comfortable clothing and clothing appropriate for easy access to any Portacath or PICC line.   We strive to give you quality time with your provider. You may need to reschedule your appointment if you arrive late (15 or more minutes).  Arriving late affects you and other patients whose appointments are after yours.  Also, if you miss three or more appointments without notifying the office, you may be dismissed from the clinic at the provider's discretion.      For prescription refill requests, have your pharmacy contact our office and allow 72 hours for refills to be completed.    Today you received the following chemotherapy and/or immunotherapy agents: cisplatin, vinblastine, doxorubicin      To help prevent nausea and vomiting after your treatment, we encourage you to take your nausea medication as directed.  BELOW ARE SYMPTOMS THAT SHOULD BE REPORTED IMMEDIATELY: *FEVER GREATER THAN 100.4 F (38 C) OR HIGHER *CHILLS OR SWEATING *NAUSEA AND VOMITING THAT IS NOT CONTROLLED WITH YOUR NAUSEA MEDICATION *UNUSUAL SHORTNESS OF BREATH *UNUSUAL BRUISING OR BLEEDING *URINARY PROBLEMS (pain or burning when urinating, or frequent urination) *BOWEL PROBLEMS (unusual diarrhea, constipation, pain near the anus) TENDERNESS IN MOUTH AND THROAT WITH OR WITHOUT PRESENCE OF ULCERS (sore throat, sores in mouth, or a toothache) UNUSUAL RASH, SWELLING OR PAIN  UNUSUAL VAGINAL DISCHARGE OR ITCHING   Items with * indicate a potential emergency and should be followed up as soon as possible or go to the Emergency Department if any problems should occur.  Please show the CHEMOTHERAPY ALERT CARD or  IMMUNOTHERAPY ALERT CARD at check-in to the Emergency Department and triage nurse.  Should you have questions after your visit or need to cancel or reschedule your appointment, please contact [place] [facility]  Dept: [phone]  and follow the prompts.  Office hours are 8:00 a.m. to 4:30 p.m. Monday - Friday. Please note that voicemails left after 4:00 p.m. may not be returned until the following business day.  We are closed weekends and major holidays. You have access to a nurse at all times for urgent questions. Please call the main number to the clinic Dept: [phone] and follow the prompts.   For any non-urgent questions, you may also contact your provider using MyChart. We now offer e-Visits for anyone 35 and older to request care online for non-urgent symptoms. For details visit [email or web address].   Also download the MyChart app! Go to the app store, search "MyChart", open the app, select [place] , and log in with your MyChart username and password.

## 2022-11-23 NOTE — Telephone Encounter (Deleted)
-----   Message from Nurse Lanora Manis A sent at 11/22/2022  2:41 PM EDT ----- Regarding: First time 11/22/22- first time methotrexate. Dr. Cathie Beams patient. Tolerated well.

## 2022-11-26 ENCOUNTER — Inpatient Hospital Stay: Payer: Medicare Other

## 2022-11-26 VITALS — BP 144/76 | HR 75 | Temp 98.3°F | Resp 18

## 2022-11-26 DIAGNOSIS — C678 Malignant neoplasm of overlapping sites of bladder: Secondary | ICD-10-CM

## 2022-11-26 DIAGNOSIS — Z5111 Encounter for antineoplastic chemotherapy: Secondary | ICD-10-CM | POA: Diagnosis not present

## 2022-11-26 MED ORDER — PEGFILGRASTIM-JMDB 6 MG/0.6ML ~~LOC~~ SOSY
6.0000 mg | PREFILLED_SYRINGE | Freq: Once | SUBCUTANEOUS | Status: AC
  Administered 2022-11-26: 6 mg via SUBCUTANEOUS

## 2022-11-26 NOTE — Patient Instructions (Signed)

## 2022-11-26 NOTE — Telephone Encounter (Signed)
-----   Message from Nurse Lanora Manis A sent at 11/22/2022  2:41 PM EDT ----- Regarding: First time 11/22/22- first time methotrexate. Dr. Cathie Beams patient. Tolerated well.

## 2022-11-26 NOTE — Telephone Encounter (Signed)
Called pt to see how he did with his recent treatment.  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  He came in for his pegfilgrastim shot today but didn't take his claritin but will take it now.  Discussed reason to help with bone pain.  He knows how to reach [name] if needed & has # to call.

## 2022-11-29 NOTE — Progress Notes (Signed)
Cardiology Office Note    Patient Name: Brad [name] Date of Encounter: 11/29/2022  Primary Care Provider:  Levin Erp, MD Primary Cardiologist:  Kristeen Miss, MD Primary Electrophysiologist: None   Past Medical History    Past Medical History:  Diagnosis Date   Abnormal ultrasound of carotid artery 04/2014   no significant obstruction   Ataxia 04/2014   Diabetes (HCC)    EKG abnormalities 04/2014   poor R wave progression, NSR   Former smoker    25 years x 1.5 ppd, stopped 04/2014   Gait disturbance 04/2014   s/p stroke   H/O echocardiogram 04/2014   moderate LVH, 60-65% EF, no valve disease   History of heart attack    Hypertension    Hypertensive heart disease 04/2014   Intraparenchymal hemorrhage of brain [facility]) 05/05/2014   hospitalization [facility]   Myocardial infarction [facility])    Obesity    Prediabetes    Short-term memory loss 04/2014   Sleep apnea    Stroke (HCC) 04/2014    History of Present Illness  Brad [name] is a 62 y.o. male with a PMH of NSTEMI secondary to malignant HTN, ICH/hemorrhagic stroke due to malignant hypertension in 2016, former tobacco abuse, OSA, obesity, CVA, DM type II who presents today for 1 year follow-up and preop clearance.  Mr. Delair was seen initially by Dr. Elease Hashimoto in 2016 following his hemorrhagic stroke due to malignant hypertension.  2D echo was completed showing EF of 60 to 65% with moderate LVH.  He was last seen by Dr. Elease Hashimoto on 01/15/2022 and reported doing well and going to the gym regularly.  He was seen by Wynell Balloon, PA on 08/2022 for preoperative clearance prior to undergoing TURP.  During visit patient reported being quite active with elliptical, treadmill and weights 90 minutes/day 5 days a week.  He completed a 2D echo on 10/2022 that showed EF of 60 to 65% with mild concentric LVH and grade 1 DD with elevated left atrial pressure and mildly dilated LA/RA.  During today's visit the patient  reports .  Patient denies chest pain, palpitations, dyspnea, PND, orthopnea, nausea, vomiting, dizziness, syncope, edema, weight gain, or early satiety.  Patient was a no show Review of Systems  Please see the history of present illness.    All other systems reviewed and are otherwise negative except as noted above.  Physical Exam    Wt Readings from Last 3 Encounters:  11/23/22 (!) 329 lb 1.9 oz (149.3 kg)  11/22/22 (!) 326 lb 9.6 oz (148.1 kg)  11/08/22 (!) 328 lb 6.4 oz (149 kg)   HY:QMVHQ were no vitals filed for this visit.,There is no height or weight on file to calculate BMI. GEN: Well nourished, well developed in no acute distress Neck: No JVD; No carotid bruits Pulmonary: Clear to auscultation without rales, wheezing or rhonchi  Cardiovascular: Normal rate. Regular rhythm. Normal S1. Normal S2.   Murmurs: There is no murmur.  ABDOMEN: Soft, non-tender, non-distended EXTREMITIES:  No edema; No deformity   EKG/LABS/ Recent Cardiac Studies   ECG personally reviewed by me today -   Risk Assessment/Calculations:          Lab Results  Component Value Date   WBC 7.8 11/22/2022   HGB 12.5 (L) 11/22/2022   HCT 37.0 (L) 11/22/2022   MCV 87.7 11/22/2022   PLT 279 11/22/2022   Lab Results  Component Value Date   CREATININE 1.19 11/22/2022   BUN 19 11/22/2022  NA 140 11/22/2022   K 4.0 11/22/2022   CL 107 11/22/2022   CO2 26 11/22/2022   Lab Results  Component Value Date   CHOL 102 09/06/2021   HDL 39 (L) 09/06/2021   LDLCALC 52 09/06/2021   TRIG 43 09/06/2021   CHOLHDL 2.6 09/06/2021    Lab Results  Component Value Date   HGBA1C 6.0 08/27/2022   Assessment & Plan    1.  Preoperative clearance -Patient's RCRI score is 6.6%  2.  Essential hypertension: -Patient's blood pressure today is  3.  History of ICH/CVA: -s/p malignant hypertension in 2016 -Today patient is  4.      Disposition: Follow-up with Kristeen Miss, MD or APP in  months      Signed, Napoleon Form, Leodis Rains, NP 11/29/2022, 12:05 PM [facility]

## 2022-11-30 ENCOUNTER — Ambulatory Visit: Payer: Medicare Other | Attending: Physician Assistant | Admitting: Nurse Practitioner

## 2022-11-30 DIAGNOSIS — Z6835 Body mass index (BMI) 35.0-35.9, adult: Secondary | ICD-10-CM

## 2022-11-30 DIAGNOSIS — Z01810 Encounter for preprocedural cardiovascular examination: Secondary | ICD-10-CM

## 2022-11-30 DIAGNOSIS — I10 Essential (primary) hypertension: Secondary | ICD-10-CM

## 2022-11-30 DIAGNOSIS — E66812 Obesity, class 2: Secondary | ICD-10-CM

## 2022-12-05 NOTE — Assessment & Plan Note (Signed)
C2 ddMVAC this week. Plan for 6 cycles or as tolerated. Repeat every 2 weeks as able See me before cycle 3 with lab appointment one hour before visit. Repeat every 2 weeks

## 2022-12-05 NOTE — Assessment & Plan Note (Addendum)
Compazine 10 mg twice daily on day 1-3 of each cycle, then as needed Echo at baseline normal Monitor for neutropenic fever and signs of infection Hydration 64 to 70+ ounces per day CBC with differential, CMP 1 day before each cycle Monitored for the hearing loss, neuropathy, renal function closely  Port in place. Infection precautions.

## 2022-12-05 NOTE — Progress Notes (Unsigned)
Patient Care Team: Levin Erp, MD as PCP - General (Family Medicine) [name], Deloris Ping, [name], [facility] Melven Sartorius, [name]  Clinic Day:  12/06/2022  Referring physician: Melven Sartorius, MD  ASSESSMENT & PLAN:   Assessment & Plan: Teague is a 62 y.o.male with history of hypertension, CVA, type 2 diabetes, OSA and history of smoking quit 7 years ago prior to diagnosis [facility] muscle invasive bladder cancer.   Tolerated cycle 1 very well.  Current diagnosis: T2 MIBC Urologist: Dr. Cristal Deer Winter CrCl >60 Current treatment: ddMVAC Methotrexate 30 mg/m2 Vinblastine 3 mg/m2 Doxorubicin 30 mg/m2  Cisplatin 70 mg/m2  Fulphila   Bladder cancer (HCC) C2 ddMVAC this week. Plan for 6 cycles or as tolerated. Repeat every 2 weeks as able See me before cycle 3 with lab appointment one hour before visit. Repeat every 2 weeks  At risk for side effect of medication Compazine 10 mg twice daily on day 1-3 of each cycle, then as needed Echo at baseline normal Monitor for neutropenic fever and signs of infection Hydration 64 to 70+ ounces per day CBC with differential, CMP 1 day before each cycle Monitored for the hearing loss, neuropathy, renal function closely  Port in place. Infection precautions.  Normocytic anemia Currently on chemotherapy.  Continue to monitor.    The patient understands the plans discussed today and is in agreement with them.  He knows to contact our office if he develops concerns prior to his next appointment.  Return in 2 weeks.  Melven Sartorius, MD  [facility] [address] [place] [address] Dept: [phone] Dept Fax: [phone]   No orders of the defined types were placed in this encounter.     CHIEF COMPLAINT:  CC:  bladder cancer on active treatment  Current Treatment:  ddMVAC  INTERVAL HISTORY:  Loel is here today for repeat clinical assessment. He denies fevers or chills. He denies abdominal pain. His appetite is good. No nausea, vomiting, constipation, diarrhea. No trouble urinating. He drinks plenty and no hematuria. No rash, edema, mouth sore. No numbness or tingling. He was able to go to the gym twice a week. I have reviewed the past medical history, past surgical history, social history and family history with the patient and they are unchanged from previous note.  ALLERGIES:  has No Known Allergies.  MEDICATIONS:  Current Outpatient Medications  Medication Sig Dispense Refill   amLODipine (NORVASC) 10 MG tablet Take 1 tablet (10 mg total) by mouth daily. 90 tablet 3   Ascorbic Acid (VITAMIN C PO) Take 1 tablet by mouth daily.     blood glucose meter kit and supplies KIT Dispense based on patient and insurance preference. Check fasting blood sugar once daily ICD10 R73.03 1 each 0   chlorthalidone (HYGROTON) 25 MG tablet Take 1 tablet (25 mg total) by mouth daily. 30 tablet 2   CRESTOR 5 MG tablet Take 5 mg by mouth 2 (two) times a week.     dexamethasone (DECADRON) 4 MG tablet Take 2 tablets (8 mg) by mouth daily x 3 days starting the day after cisplatin chemotherapy. Take with food. 30 tablet 1   glucose blood test strip Use as instructed to monitor FSBS 1x daily. Dx: R73.09 100 each 12   labetalol (NORMODYNE) 200 MG tablet TAKE  1 TABLET(200 MG) BY MOUTH TWICE DAILY 180 tablet 3   Lancets (ONETOUCH ULTRASOFT) lancets Use as instructed 100 each 12   lidocaine-prilocaine (EMLA) cream Apply to affected area once 30 g 3   losartan (COZAAR) 100 MG tablet Take 1 tablet (100 mg total) by mouth daily. 90 tablet 3   MOUNJARO 12.5 MG/0.5ML Pen Inject 12.5 mg into the skin once a week.     Multiple Vitamins-Minerals (ONE-A-DAY MENS 50+ ADVANTAGE) TABS Take 1 tablet by mouth daily.     ondansetron  (ZOFRAN) 8 MG tablet Take 1 tablet (8 mg total) by mouth every 8 (eight) hours as needed for nausea or vomiting. Start on the third day after cisplatin. (Patient not taking: Reported on 11/22/2022) 30 tablet 1   oxybutynin (DITROPAN) 5 MG tablet Take 1 tablet (5 mg total) by mouth every 8 (eight) hours as needed for bladder spasms. 30 tablet 1   phenazopyridine (PYRIDIUM) 200 MG tablet Take 1 tablet (200 mg total) by mouth 3 (three) times daily as needed (for pain with urination). 30 tablet 0   POTASSIUM PO Take 1 tablet by mouth daily.     prochlorperazine (COMPAZINE) 10 MG tablet Take 1 tablet (10 mg total) by mouth every 6 (six) hours as needed (Nausea or vomiting). (Patient not taking: Reported on 11/22/2022) 30 tablet 1   tadalafil (CIALIS) 20 MG tablet TAKE 1 TABLET(20 MG) BY MOUTH DAILY AS NEEDED 30 tablet 3   Vitamin D, Ergocalciferol, (DRISDOL) 1.25 MG (50000 UNIT) CAPS capsule Take 1 capsule (50,000 Units total) by mouth every 7 (seven) days. 8 capsule 0   No current facility-administered medications for this visit.    HISTORY OF PRESENT ILLNESS:   Oncology History  Bladder cancer (HCC)  08/28/2022 Imaging   1. Contrast enhancing, lobulated endoluminal mass of the anterior bladder dome, measuring 2.6 x 2.0 x 1.2 cm, consistent with primary bladder malignancy. 2. Prominent varices about the bladder dome and anterior bladder, without overt evidence of extramural tumor extent. 3. No evidence of lymphadenopathy or metastatic disease in the abdomen or pelvis. 4. Prostatomegaly.   09/11/2022 Initial Diagnosis   Bladder cancer [facility]) At age 10 year old found to have a solid enhancing 2.5 cm anterior bladder dome mass on CT hematuria protocol on 08/28/2022 during evaluation for gross hematuria.    09/26/2022 Pathology Results   A. BLADDER TUMOR, SUPERFICIAL MARGIN DOME, TURBT:  High grade papillary urothelial carcinoma with inverted growth pattern  The carcinoma invades muscularis  propria   B. BLADDER TUMOR, DEEP MARGIN DOME, TURBT:  Invasive high grade papillary urothelial carcinoma  Muscularis propria (detrusor muscle) is present and not involved by  carcinoma   C. BLADDER TUMOR, INFERIOR MARGIN, BIOPSY:  Submucosa and muscularis propria is present and not involved by  carcinoma    09/26/2022 Procedure   1.  Cystoscopy with TURBT (medium) 2.  Intravesical instillation of gemcitabine 3.  Cystogram with intraoperative interpretation of fluoroscopic imaging   11/22/2022 -  Chemotherapy   Patient is on Treatment Plan : BLADDER DOSE DENSE MVAC q14d         REVIEW OF SYSTEMS:   All relevant systems were reviewed with the patient and are negative.   VITALS:  Blood pressure (!) 145/76, pulse 76, temperature 98 F (36.7 C), temperature source Oral, resp. rate 15, weight (!) 329 lb (149.2 kg), SpO2 97%.  Wt Readings from Last 3 Encounters:  12/06/22 (!) 329 lb (149.2 kg)  11/23/22 (!) 329 lb 1.9 oz (149.3  kg)  11/22/22 (!) 326 lb 9.6 oz (148.1 kg)    Body mass index is 35.26 kg/m.  Performance status (ECOG): 0 - Asymptomatic  PHYSICAL EXAM:   GENERAL:alert, no distress and comfortable SKIN: skin color normal, no rashes  EYES: sclera clear OROPHARYNX: no exudate, no erythema    LUNGS: clear to auscultation with normal breathing effort.  No wheeze or rales HEART: regular rate & rhythm and no murmurs and no lower extremity edema ABDOMEN: abdomen soft, non-tender and nondistended Musculoskeletal: no edema NEURO: alert  LABORATORY DATA:  I have reviewed the data as listed    Component Value Date/Time   NA 139 12/06/2022 0754   NA 142 08/27/2022 1535   K 3.9 12/06/2022 0754   CL 105 12/06/2022 0754   CO2 26 12/06/2022 0754   GLUCOSE 326 (H) 12/06/2022 0754   BUN 20 12/06/2022 0754   BUN 20 08/27/2022 1535   CREATININE 1.17 12/06/2022 0754   CREATININE 1.50 (H) 02/18/2018 1306   CALCIUM 9.2 12/06/2022 0754   PROT 6.5 12/06/2022 0754   PROT  7.2 08/27/2022 1535   ALBUMIN 3.9 12/06/2022 0754   ALBUMIN 4.5 08/27/2022 1535   AST 14 (L) 12/06/2022 0754   ALT 24 12/06/2022 0754   ALKPHOS 66 12/06/2022 0754   BILITOT 0.2 12/06/2022 0754   GFRNONAA >60 12/06/2022 0754   GFRAA 75 01/12/2020 1056    No results found for: "SPEP", "UPEP"  Lab Results  Component Value Date   WBC 9.4 12/06/2022   NEUTROABS 5.6 12/06/2022   HGB 11.6 (L) 12/06/2022   HCT 34.5 (L) 12/06/2022   MCV 88.5 12/06/2022   PLT 171 12/06/2022      Chemistry      Component Value Date/Time   NA 139 12/06/2022 0754   NA 142 08/27/2022 1535   K 3.9 12/06/2022 0754   CL 105 12/06/2022 0754   CO2 26 12/06/2022 0754   BUN 20 12/06/2022 0754   BUN 20 08/27/2022 1535   CREATININE 1.17 12/06/2022 0754   CREATININE 1.50 (H) 02/18/2018 1306      Component Value Date/Time   CALCIUM 9.2 12/06/2022 0754   ALKPHOS 66 12/06/2022 0754   AST 14 (L) 12/06/2022 0754   ALT 24 12/06/2022 0754   BILITOT 0.2 12/06/2022 0754       RADIOGRAPHIC STUDIES: I have personally reviewed the radiological images as listed and agreed with the findings in the report. IR IMAGING GUIDED PORT INSERTION  Result Date: 11/08/2022 INDICATION: 62 year old with bladder cancer.  Port-A-Cath needed for treatment. EXAM: FLUOROSCOPIC AND ULTRASOUND GUIDED PLACEMENT OF A SUBCUTANEOUS PORT COMPARISON:  None Available. MEDICATIONS: Moderate sedation ANESTHESIA/SEDATION: Moderate (conscious) sedation was employed during this procedure. A total of Versed 2 mg and fentanyl 100 mcg was administered intravenously at the order of the provider performing the procedure. Total intra-service moderate sedation time: 28 minutes. Patient's level of consciousness and vital signs were monitored continuously by radiology nurse throughout the procedure under the supervision of the provider performing the procedure. FLUOROSCOPY TIME:  Radiation Exposure Index (as provided by the fluoroscopic device): 6 mGy Kerma  COMPLICATIONS: None immediate. PROCEDURE: The procedure, risks, benefits, and alternatives were explained to the patient. Questions regarding the procedure were encouraged and answered. The patient understands and consents to the procedure. Patient was placed supine on the interventional table. Ultrasound confirmed a patent right internal jugular vein. Ultrasound image was saved for documentation. The right chest and neck were cleaned with a skin antiseptic and a  sterile drape was placed. Maximal barrier sterile technique was utilized including caps, mask, sterile gowns, sterile gloves, sterile drape, hand hygiene and skin antiseptic. The right neck was anesthetized with 1% lidocaine. Small incision was made in the right neck with a blade. Micropuncture set was placed in the right internal jugular vein with ultrasound guidance. The micropuncture wire was used for measurement purposes. The right chest was anesthetized with 1% lidocaine with epinephrine. #15 blade was used to make an incision and a subcutaneous port pocket was formed. 8 french Power Port was assembled. Subcutaneous tunnel was formed with a stiff tunneling device. The port catheter was brought through the subcutaneous tunnel. The port was placed in the subcutaneous pocket. The micropuncture set was exchanged for a peel-away sheath. The catheter was placed through the peel-away sheath and the tip was positioned at the superior cavoatrial junction. Catheter placement was confirmed with fluoroscopy. The port was accessed and flushed with heparinized saline. The port pocket was closed using two layers of absorbable sutures and Dermabond. The vein skin site was closed using a single layer of absorbable suture and Dermabond. Sterile dressings were applied. Patient tolerated the procedure well without an immediate complication. Ultrasound and fluoroscopic images were taken and saved for this procedure. IMPRESSION: Placement of a subcutaneous power-injectable  port device. Catheter tip at the superior cavoatrial junction. Electronically Signed   By: Richarda Overlie M.D.   On: 11/08/2022 14:35

## 2022-12-06 ENCOUNTER — Inpatient Hospital Stay (HOSPITAL_BASED_OUTPATIENT_CLINIC_OR_DEPARTMENT_OTHER): Payer: Medicare Other

## 2022-12-06 ENCOUNTER — Inpatient Hospital Stay: Payer: Medicare Other

## 2022-12-06 VITALS — BP 145/76 | HR 76 | Temp 98.0°F | Resp 15 | Wt 329.0 lb

## 2022-12-06 DIAGNOSIS — Z9189 Other specified personal risk factors, not elsewhere classified: Secondary | ICD-10-CM | POA: Diagnosis not present

## 2022-12-06 DIAGNOSIS — Z7985 Long-term (current) use of injectable non-insulin antidiabetic drugs: Secondary | ICD-10-CM | POA: Insufficient documentation

## 2022-12-06 DIAGNOSIS — C678 Malignant neoplasm of overlapping sites of bladder: Secondary | ICD-10-CM

## 2022-12-06 DIAGNOSIS — Z8673 Personal history of transient ischemic attack (TIA), and cerebral infarction without residual deficits: Secondary | ICD-10-CM | POA: Insufficient documentation

## 2022-12-06 DIAGNOSIS — Z7952 Long term (current) use of systemic steroids: Secondary | ICD-10-CM | POA: Diagnosis not present

## 2022-12-06 DIAGNOSIS — Z79631 Long term (current) use of antimetabolite agent: Secondary | ICD-10-CM | POA: Diagnosis not present

## 2022-12-06 DIAGNOSIS — E119 Type 2 diabetes mellitus without complications: Secondary | ICD-10-CM | POA: Diagnosis not present

## 2022-12-06 DIAGNOSIS — Z79633 Long term (current) use of mitotic inhibitor: Secondary | ICD-10-CM | POA: Insufficient documentation

## 2022-12-06 DIAGNOSIS — Z5111 Encounter for antineoplastic chemotherapy: Secondary | ICD-10-CM | POA: Insufficient documentation

## 2022-12-06 DIAGNOSIS — N400 Enlarged prostate without lower urinary tract symptoms: Secondary | ICD-10-CM | POA: Insufficient documentation

## 2022-12-06 DIAGNOSIS — D649 Anemia, unspecified: Secondary | ICD-10-CM

## 2022-12-06 DIAGNOSIS — Z87891 Personal history of nicotine dependence: Secondary | ICD-10-CM | POA: Diagnosis not present

## 2022-12-06 DIAGNOSIS — R310 Gross hematuria: Secondary | ICD-10-CM | POA: Insufficient documentation

## 2022-12-06 DIAGNOSIS — I10 Essential (primary) hypertension: Secondary | ICD-10-CM | POA: Insufficient documentation

## 2022-12-06 DIAGNOSIS — Z5189 Encounter for other specified aftercare: Secondary | ICD-10-CM | POA: Insufficient documentation

## 2022-12-06 DIAGNOSIS — Z79899 Other long term (current) drug therapy: Secondary | ICD-10-CM | POA: Diagnosis not present

## 2022-12-06 DIAGNOSIS — Z79632 Long term (current) use of antitumor antibiotic: Secondary | ICD-10-CM | POA: Diagnosis not present

## 2022-12-06 DIAGNOSIS — Z79630 Long term (current) use of alkylating agent: Secondary | ICD-10-CM | POA: Insufficient documentation

## 2022-12-06 DIAGNOSIS — G4733 Obstructive sleep apnea (adult) (pediatric): Secondary | ICD-10-CM | POA: Diagnosis not present

## 2022-12-06 LAB — CBC WITH DIFFERENTIAL (CANCER CENTER ONLY)
Abs Immature Granulocytes: 0.82 10*3/uL — ABNORMAL HIGH (ref 0.00–0.07)
Basophils Absolute: 0.1 10*3/uL (ref 0.0–0.1)
Basophils Relative: 1 %
Eosinophils Absolute: 0.1 10*3/uL (ref 0.0–0.5)
Eosinophils Relative: 1 %
HCT: 34.5 % — ABNORMAL LOW (ref 39.0–52.0)
Hemoglobin: 11.6 g/dL — ABNORMAL LOW (ref 13.0–17.0)
Immature Granulocytes: 9 %
Lymphocytes Relative: 20 %
Lymphs Abs: 1.9 10*3/uL (ref 0.7–4.0)
MCH: 29.7 pg (ref 26.0–34.0)
MCHC: 33.6 g/dL (ref 30.0–36.0)
MCV: 88.5 fL (ref 80.0–100.0)
Monocytes Absolute: 1 10*3/uL (ref 0.1–1.0)
Monocytes Relative: 10 %
Neutro Abs: 5.6 10*3/uL (ref 1.7–7.7)
Neutrophils Relative %: 59 %
Platelet Count: 171 10*3/uL (ref 150–400)
RBC: 3.9 MIL/uL — ABNORMAL LOW (ref 4.22–5.81)
RDW: 13.4 % (ref 11.5–15.5)
WBC Count: 9.4 10*3/uL (ref 4.0–10.5)
nRBC: 0.5 % — ABNORMAL HIGH (ref 0.0–0.2)

## 2022-12-06 LAB — CMP (CANCER CENTER ONLY)
ALT: 24 U/L (ref 0–44)
AST: 14 U/L — ABNORMAL LOW (ref 15–41)
Albumin: 3.9 g/dL (ref 3.5–5.0)
Alkaline Phosphatase: 66 U/L (ref 38–126)
Anion gap: 8 (ref 5–15)
BUN: 20 mg/dL (ref 6–20)
CO2: 26 mmol/L (ref 22–32)
Calcium: 9.2 mg/dL (ref 8.9–10.3)
Chloride: 105 mmol/L (ref 98–111)
Creatinine: 1.17 mg/dL (ref 0.61–1.24)
GFR, Estimated: >60 mL/min (ref >60)
Glucose, Bld: 326 mg/dL — ABNORMAL HIGH (ref 70–99)
Potassium: 3.9 mmol/L (ref 3.5–5.1)
Sodium: 139 mmol/L (ref 135–145)
Total Bilirubin: 0.2 mg/dL (ref <1.2)
Total Protein: 6.5 g/dL (ref 6.5–8.1)

## 2022-12-06 LAB — MAGNESIUM: Magnesium: 1.7 mg/dL (ref 1.7–2.4)

## 2022-12-06 MED ORDER — SODIUM CHLORIDE 0.9 % IV SOLN: Freq: Once | INTRAVENOUS | Status: AC

## 2022-12-06 MED ORDER — METHOTREXATE SODIUM CHEMO INJECTION (PF) 50 MG/2ML
30.0000 mg/m2 | Freq: Once | INTRAMUSCULAR | Status: AC
Start: 2022-12-06 — End: 2022-12-06
  Administered 2022-12-06: 87.5 mg via INTRAVENOUS
  Filled 2022-12-06: qty 3.5

## 2022-12-06 MED ORDER — DEXAMETHASONE SODIUM PHOSPHATE 10 MG/ML IJ SOLN
10.0000 mg | Freq: Once | INTRAMUSCULAR | Status: AC
  Administered 2022-12-06: 10 mg via INTRAVENOUS
  Filled 2022-12-06: qty 1

## 2022-12-06 MED ORDER — SODIUM CHLORIDE 0.9% FLUSH
10.0000 mL | INTRAVENOUS | Status: DC | PRN
Start: 2022-12-06 — End: 2022-12-06
  Administered 2022-12-06: 10 mL

## 2022-12-06 MED ORDER — HEPARIN SOD (PORK) LOCK FLUSH 100 UNIT/ML IV SOLN
500.0000 [IU] | Freq: Once | INTRAVENOUS | Status: AC | PRN
  Administered 2022-12-06: 500 [IU]

## 2022-12-06 MED ORDER — ONDANSETRON HCL 4 MG/2ML IJ SOLN
8.0000 mg | Freq: Once | INTRAMUSCULAR | Status: AC
  Administered 2022-12-06: 8 mg via INTRAVENOUS
  Filled 2022-12-06: qty 4

## 2022-12-06 MED ORDER — SODIUM CHLORIDE 0.9% FLUSH
10.0000 mL | Freq: Once | INTRAVENOUS | Status: AC
  Administered 2022-12-06: 10 mL

## 2022-12-06 MED FILL — Fosaprepitant Dimeglumine For IV Infusion 150 MG (Base Eq): INTRAVENOUS | Qty: 5 | Status: AC

## 2022-12-06 NOTE — Assessment & Plan Note (Signed)
Currently on chemotherapy.  Continue to monitor.

## 2022-12-06 NOTE — Patient Instructions (Signed)
[facility]  Discharge Instructions: Thank you for choosing [facility] to provide your oncology and hematology care.   If you have a lab appointment with the Cancer Center, please go directly to the Cancer Center and check in at the registration area.   Wear comfortable clothing and clothing appropriate for easy access to any Portacath or PICC line.   We strive to give you quality time with your provider. You may need to reschedule your appointment if you arrive late (15 or more minutes).  Arriving late affects you and other patients whose appointments are after yours.  Also, if you miss three or more appointments without notifying the office, you may be dismissed from the clinic at the provider's discretion.      For prescription refill requests, have your pharmacy contact our office and allow 72 hours for refills to be completed.    Today you received the following chemotherapy and/or immunotherapy agents: Methotrexate      To help prevent nausea and vomiting after your treatment, we encourage you to take your nausea medication as directed.  BELOW ARE SYMPTOMS THAT SHOULD BE REPORTED IMMEDIATELY: *FEVER GREATER THAN 100.4 F (38 C) OR HIGHER *CHILLS OR SWEATING *NAUSEA AND VOMITING THAT IS NOT CONTROLLED WITH YOUR NAUSEA MEDICATION *UNUSUAL SHORTNESS OF BREATH *UNUSUAL BRUISING OR BLEEDING *URINARY PROBLEMS (pain or burning when urinating, or frequent urination) *BOWEL PROBLEMS (unusual diarrhea, constipation, pain near the anus) TENDERNESS IN MOUTH AND THROAT WITH OR WITHOUT PRESENCE OF ULCERS (sore throat, sores in mouth, or a toothache) UNUSUAL RASH, SWELLING OR PAIN  UNUSUAL VAGINAL DISCHARGE OR ITCHING   Items with * indicate a potential emergency and should be followed up as soon as possible or go to the Emergency Department if any problems should occur.  Please show the CHEMOTHERAPY ALERT CARD or  IMMUNOTHERAPY ALERT CARD at check-in to the Emergency Department and triage nurse.  Should you have questions after your visit or need to cancel or reschedule your appointment, please contact [facility] - A DEPT OF [facility]  Dept: [phone]  and follow the prompts.  Office hours are 8:00 a.m. to 4:30 p.m. Monday - Friday. Please note that voicemails left after 4:00 p.m. may not be returned until the following business day.  We are closed weekends and major holidays. You have access to a nurse at all times for urgent questions. Please call the main number to the clinic Dept: [phone] and follow the prompts.   For any non-urgent questions, you may also contact your provider using MyChart. We now offer e-Visits for anyone 36 and older to request care online for non-urgent symptoms. For details visit [email or web address].   Also download the MyChart app! Go to the app store, search "MyChart", open the app, select [place], and log in with your MyChart username and password.  Methotrexate Injection What is this medication? METHOTREXATE (METH oh TREX ate) treats inflammatory conditions such as arthritis and psoriasis. It works by decreasing inflammation, which can reduce pain and prevent long-term injury to the joints and skin. It may also be used to treat some types of cancer. It works by slowing down the growth of cancer cells. This medicine may be used for other purposes; ask your health care provider or pharmacist if you have questions. What should I tell my care team before I take this medication? They need to know if you have  any of these conditions: Fluid in the stomach area or lungs Frequently drink alcohol Infection or immune system problems Kidney disease Liver disease Low blood counts (Halt cells, platelets, or red blood cells) Lung disease Recent or ongoing radiation Recent or upcoming vaccine Stomach ulcers Ulcerative colitis An  unusual or allergic reaction to methotrexate, other medications, foods, dyes, or preservatives Pregnant or trying to get pregnant Breastfeeding How should I use this medication? This medication is for infusion into a vein or for injection into muscle or into the spinal fluid (whichever applies). It is usually given in a hospital or clinic setting. In rare cases, you might get this medication at home. You will be taught how to give this medication. Use exactly as directed. Take your medication at regular intervals. Do not take your medication more often than directed. If this medication is used for arthritis or psoriasis, it should be taken weekly, NOT daily. It is important that you put your used needles and syringes in a special sharps container. Do not put them in a trash can. If you do not have a sharps container, call your pharmacist or care team to get one. Talk to your care team about the use of this medication in children. While this medication may be prescribed for children as young as 2 years for selected conditions, precautions do apply. Overdosage: If you think you have taken too much of this medicine contact a poison control center or emergency room at once. NOTE: This medicine is only for you. Do not share this medicine with others. What if I miss a dose? It is important not to miss your dose. Call your care team if you are unable to keep an appointment. If you give yourself the medication, and you miss a dose, talk with your care team. Do not take double or extra doses. What may interact with this medication? Do not take this medication with any of the following: Acitretin Probenecid This medication may also interact with the following: Aspirin or aspirin-like medications Azathioprine Certain antibiotics, such as gentamicin, penicillin, tetracycline, vancomycin Certain medications that treat or prevent blood clots, such as warfarin, apixaban, dabigatran, rivaroxaban Certain  medications for stomach problems, such as esomeprazole, omeprazole, pantoprazole Dapsone Hydroxychloroquine Live virus vaccines Medications for viral infections, such as acyclovir, cidofovir, foscarnet, ganciclovir Mercaptopurine NSAIDs, medications for pain and inflammation, such as ibuprofen or naproxen Phenytoin Pyrimethamine Retinoids, such as isotretinoin or tretinoin Sulfonamides, such as sulfasalazine or trimethoprim; sulfamethoxazole Theophylline This list may not describe all possible interactions. Give your health care provider a list of all the medicines, herbs, non-prescription drugs, or dietary supplements you use. Also tell them if you smoke, drink alcohol, or use illegal drugs. Some items may interact with your medicine. What should I watch for while using this medication? This medication may make you feel generally unwell. This is not uncommon as chemotherapy can affect healthy cells as well as cancer cells. Report any side effects. Continue your course of treatment even though you feel ill unless your care team tells you to stop. Your condition will be monitored carefully while you are receiving this medication. Avoid alcoholic drinks. This medication can cause serious side effects. To reduce the risk, your care team may give you other medications to take before receiving this one. Be sure to follow the directions from your care team. This medication can make you more sensitive to the sun. Keep out of the sun. If you cannot avoid being in the sun, wear protective clothing and  use sunscreen. Do not use sun lamps or tanning beds/booths. You may get drowsy or dizzy. Do not drive, use machinery, or do anything that needs mental alertness until you know how this medication affects you. Do not stand or sit up quickly, especially if you are an older patient. This reduces the risk of dizzy or fainting spells. You may need blood work while you are taking this medication. Call your care  team for advice if you get a fever, chills or sore throat, or other symptoms of a cold or flu. Do not treat yourself. This medication decreases your body's ability to fight infections. Try to avoid being around people who are sick. This medication may increase your risk to bruise or bleed. Call your care team if you notice any unusual bleeding. Be careful brushing or flossing your teeth or using a toothpick because you may get an infection or bleed more easily. If you have any dental work done, tell your dentist you are receiving this medication Check with your care team if you get an attack of severe diarrhea, nausea and vomiting, or if you sweat a lot. The loss of too much body fluid can make it dangerous for you to take this medication. Talk to your care team about your risk of cancer. You may be more at risk for certain types of cancers if you take this medication. Do not become pregnant while taking this medication or for 6 months after stopping it. Women should inform their care team if they wish to become pregnant or think they might be pregnant. Men should not father a child while taking this medication and for 3 months after stopping it. There is potential for serious harm to an unborn child. Talk to your care team for more information. Do not breast-feed an infant while taking this medication or for 1 week after stopping it. This medication may make it more difficult to get pregnant or father a child. Talk to your care team if you are concerned about your fertility. What side effects may I notice from receiving this medication? Side effects that you should report to your care team as soon as possible: Allergic reactions--skin rash, itching, hives, swelling of the face, lips, tongue, or throat Blood clot--pain, swelling, or warmth in the leg, shortness of breath, chest pain Dry cough, shortness of breath or trouble breathing Infection--fever, chills, cough, sore throat, wounds that don't heal,  pain or trouble when passing urine, general feeling of discomfort or being unwell Kidney injury--decrease in the amount of urine, swelling of the ankles, hands, or feet Liver injury--right upper belly pain, loss of appetite, nausea, light-colored stool, dark yellow or brown urine, yellowing of the skin or eyes, unusual weakness or fatigue Low red blood cell count--unusual weakness or fatigue, dizziness, headache, trouble breathing Redness, blistering, peeling, or loosening of the skin, including inside the mouth Seizures Unusual bruising or bleeding Side effects that usually do not require medical attention (report to your care team if they continue or are bothersome): Diarrhea Dizziness Hair loss Nausea Pain, redness, or swelling with sores inside the mouth or throat Vomiting This list may not describe all possible side effects. Call your doctor for medical advice about side effects. You may report side effects to FDA at 1-800-FDA-1088. Where should I keep my medication? This medication is given in a hospital or clinic. It will not be stored at home. NOTE: This sheet is a summary. It may not cover all possible information. If you have questions  about this medicine, talk to your doctor, pharmacist, or health care provider.  2024 Elsevier/Gold Standard (2022-06-20 00:00:00)

## 2022-12-07 ENCOUNTER — Inpatient Hospital Stay: Payer: Medicare Other

## 2022-12-07 VITALS — BP 145/77 | HR 81 | Temp 97.7°F | Resp 16 | Wt 329.2 lb

## 2022-12-07 DIAGNOSIS — C678 Malignant neoplasm of overlapping sites of bladder: Secondary | ICD-10-CM

## 2022-12-07 DIAGNOSIS — Z5111 Encounter for antineoplastic chemotherapy: Secondary | ICD-10-CM | POA: Diagnosis not present

## 2022-12-07 MED ORDER — SODIUM CHLORIDE 0.9 % IV SOLN
150.0000 mg | Freq: Once | INTRAVENOUS | Status: AC
  Administered 2022-12-07: 150 mg via INTRAVENOUS
  Filled 2022-12-07: qty 150

## 2022-12-07 MED ORDER — HEPARIN SOD (PORK) LOCK FLUSH 100 UNIT/ML IV SOLN
500.0000 [IU] | Freq: Once | INTRAVENOUS | Status: AC | PRN
  Administered 2022-12-07: 500 [IU]

## 2022-12-07 MED ORDER — SODIUM CHLORIDE 0.9 % IV SOLN
70.0000 mg/m2 | Freq: Once | INTRAVENOUS | Status: AC
  Administered 2022-12-07: 200 mg via INTRAVENOUS
  Filled 2022-12-07: qty 200

## 2022-12-07 MED ORDER — DOXORUBICIN HCL CHEMO IV INJECTION 2 MG/ML
30.0000 mg/m2 | Freq: Once | INTRAVENOUS | Status: AC
  Administered 2022-12-07: 88 mg via INTRAVENOUS
  Filled 2022-12-07: qty 44

## 2022-12-07 MED ORDER — VINBLASTINE SULFATE CHEMO INJECTION 1 MG/ML
3.0000 mg/m2 | Freq: Once | INTRAVENOUS | Status: AC
  Administered 2022-12-07: 8.8 mg via INTRAVENOUS
  Filled 2022-12-07: qty 8.8

## 2022-12-07 MED ORDER — SODIUM CHLORIDE 0.9% FLUSH
10.0000 mL | INTRAVENOUS | Status: DC | PRN
Start: 2022-12-07 — End: 2022-12-07
  Administered 2022-12-07: 10 mL

## 2022-12-07 MED ORDER — POTASSIUM CHLORIDE IN NACL 20-0.9 MEQ/L-% IV SOLN
Freq: Once | INTRAVENOUS | Status: AC
  Filled 2022-12-07: qty 1000

## 2022-12-07 MED ORDER — SODIUM CHLORIDE 0.9 % IV SOLN: Freq: Once | INTRAVENOUS | Status: AC

## 2022-12-07 MED ORDER — MAGNESIUM SULFATE 2 GM/50ML IV SOLN
2.0000 g | Freq: Once | INTRAVENOUS | Status: AC
  Administered 2022-12-07: 2 g via INTRAVENOUS
  Filled 2022-12-07: qty 50

## 2022-12-07 MED ORDER — DEXAMETHASONE SODIUM PHOSPHATE 10 MG/ML IJ SOLN
10.0000 mg | Freq: Once | INTRAMUSCULAR | Status: AC
  Administered 2022-12-07: 10 mg via INTRAVENOUS
  Filled 2022-12-07: qty 1

## 2022-12-07 MED ORDER — PALONOSETRON HCL INJECTION 0.25 MG/5ML
0.2500 mg | Freq: Once | INTRAVENOUS | Status: AC
  Administered 2022-12-07: 0.25 mg via INTRAVENOUS
  Filled 2022-12-07: qty 5

## 2022-12-07 NOTE — Patient Instructions (Addendum)
[facility] - [facility]  Discharge Instructions: Thank you for choosing [facility] to provide your oncology and hematology care.   If you have a lab appointment with the Cancer Center, please go directly to the Cancer Center and check in at the registration area.   Wear comfortable clothing and clothing appropriate for easy access to any Portacath or PICC line.   We strive to give you quality time with your provider. You may need to reschedule your appointment if you arrive late (15 or more minutes).  Arriving late affects you and other patients whose appointments are after yours.  Also, if you miss three or more appointments without notifying the office, you may be dismissed from the clinic at the provider's discretion.      For prescription refill requests, have your pharmacy contact our office and allow 72 hours for refills to be completed.    Today you received the following chemotherapy and/or immunotherapy agents: cisplatin, vinblastine, doxorubicin      To help prevent nausea and vomiting after your treatment, we encourage you to take your nausea medication as directed.  BELOW ARE SYMPTOMS THAT SHOULD BE REPORTED IMMEDIATELY: *FEVER GREATER THAN 100.4 F (38 C) OR HIGHER *CHILLS OR SWEATING *NAUSEA AND VOMITING THAT IS NOT CONTROLLED WITH YOUR NAUSEA MEDICATION *UNUSUAL SHORTNESS OF BREATH *UNUSUAL BRUISING OR BLEEDING *URINARY PROBLEMS (pain or burning when urinating, or frequent urination) *BOWEL PROBLEMS (unusual diarrhea, constipation, pain near the anus) TENDERNESS IN MOUTH AND THROAT WITH OR WITHOUT PRESENCE OF ULCERS (sore throat, sores in mouth, or a toothache) UNUSUAL RASH, SWELLING OR PAIN  UNUSUAL VAGINAL DISCHARGE OR ITCHING   Items with * indicate a potential emergency and should be followed up as soon as possible or go to the Emergency Department if any problems should occur.  Please show the CHEMOTHERAPY  ALERT CARD or IMMUNOTHERAPY ALERT CARD at check-in to the Emergency Department and triage nurse.  Should you have questions after your visit or need to cancel or reschedule your appointment, please contact [facility] - A DEPT OF [facility]  Dept: [phone]  and follow the prompts.  Office hours are 8:00 a.m. to 4:30 p.m. Monday - Friday. Please note that voicemails left after 4:00 p.m. may not be returned until the following business day.  We are closed weekends and major holidays. You have access to a nurse at all times for urgent questions. Please call the main number to the clinic Dept: [phone] and follow the prompts.   For any non-urgent questions, you may also contact your provider using MyChart. We now offer e-Visits for anyone 55 and older to request care online for non-urgent symptoms. For details visit [email or web address].   Also download the MyChart app! Go to the app store, search "MyChart", open the app, select , and log in with your MyChart username and password. \[id number]\

## 2022-12-10 ENCOUNTER — Inpatient Hospital Stay: Payer: Medicare Other

## 2022-12-10 VITALS — BP 153/93 | HR 71 | Temp 98.2°F | Resp 18

## 2022-12-10 DIAGNOSIS — C678 Malignant neoplasm of overlapping sites of bladder: Secondary | ICD-10-CM

## 2022-12-10 DIAGNOSIS — Z5111 Encounter for antineoplastic chemotherapy: Secondary | ICD-10-CM | POA: Diagnosis not present

## 2022-12-10 MED ORDER — PEGFILGRASTIM-JMDB 6 MG/0.6ML ~~LOC~~ SOSY
6.0000 mg | PREFILLED_SYRINGE | Freq: Once | SUBCUTANEOUS | Status: AC
  Administered 2022-12-10: 6 mg via SUBCUTANEOUS
  Filled 2022-12-10: qty 0.6

## 2022-12-18 NOTE — Assessment & Plan Note (Signed)
 Currently on chemotherapy.  Continue to monitor.

## 2022-12-18 NOTE — Assessment & Plan Note (Signed)
C3 ddMVAC this week. Plan for 6 cycles or as tolerated. Repeat every 2 weeks as able See me before cycle 4 with lab appointment one hour before visit. Repeat every 2 weeks

## 2022-12-18 NOTE — Progress Notes (Signed)
Patient Care Team: Levin Erp, MD as PCP - General (Family Medicine) [name], Deloris Ping, [name], [facility] Melven Sartorius, [name]  Clinic Day:  12/20/2022  Referring physician: Melven Sartorius, MD  ASSESSMENT & PLAN:   Assessment & Plan: Brad [name] is a 62 y.o.male with history of hypertension, CVA, type 2 diabetes, OSA and history of smoking quit 7 years ago prior to diagnosis [facility] muscle invasive bladder cancer.   Tolerated cycle 2 very well.   Current diagnosis: T2 MIBC Urologist: Dr. Cristal Deer Winter CrCl >60 Current treatment: ddMVAC Methotrexate 30 mg/m2 Vinblastine 3 mg/m2 Doxorubicin 30 mg/m2  Cisplatin 70 mg/m2  Fulphila   At risk for side effect of medication Compazine 10 mg twice daily on day 1-3 of each cycle, then as needed Echo at baseline normal Monitor for neutropenic fever and signs of infection Hydration 64 to 70+ ounces per day CBC with differential, CMP 1 day before each cycle Monitored for the hearing loss, neuropathy, renal function closely  Port in place. Infection precautions.  Bladder cancer (HCC) C3 ddMVAC this week. Plan for 6 cycles or as tolerated. Repeat every 2 weeks as able See me before cycle 4 with lab appointment one hour before visit. Repeat every 2 weeks  Normocytic anemia Currently on chemotherapy.  Continue to monitor.    The patient understands the plans discussed today and is in agreement with them.  He knows to contact our office if he develops concerns prior to his next appointment.  Melven Sartorius, MD  [facility] - A DE[facility] OF [facility] [address] [place] [address] Dept: [phone] Dept Fax: [phone]   No orders of the defined types were placed in this encounter.     CHIEF COMPLAINT:  CC: bladder  cancer  Current Treatment:  ddMVAC  INTERVAL HISTORY:  Brad [name] is here today for repeat clinical assessment. He denies fevers or chills. He denies chest pain or stomach pain. No coughing short of breath. No numbness or tingling, hearing loss. His appetite is good. No bleeding. No constipation or diarrhea.  I have reviewed the past medical history, past surgical history, social history and family history with the patient and they are unchanged from previous note.  ALLERGIES:  has No Known Allergies.  MEDICATIONS:  Current Outpatient Medications  Medication Sig Dispense Refill   amLODipine (NORVASC) 10 MG tablet Take 1 tablet (10 mg total) by mouth daily. 90 tablet 3   Ascorbic Acid (VITAMIN C PO) Take 1 tablet by mouth daily.     blood glucose meter kit and supplies KIT Dispense based on patient and insurance preference. Check fasting blood sugar once daily ICD10 R73.03 1 each 0   chlorthalidone (HYGROTON) 25 MG tablet Take 1 tablet (25 mg total) by mouth daily. 30 tablet 2   CRESTOR 5 MG tablet Take 5 mg by mouth 2 (two) times a week.     dexamethasone (DECADRON) 4 MG tablet Take 2 tablets (8 mg) by mouth daily x 3 days starting the day after cisplatin chemotherapy. Take with food. 30 tablet 1   glucose blood test strip Use as instructed to monitor FSBS 1x daily. Dx: R73.09 100 each 12   labetalol (NORMODYNE) 200 MG tablet TAKE 1 TABLET(200 MG) BY MOUTH TWICE DAILY 180 tablet 3   Lancets (ONETOUCH ULTRASOFT) lancets Use as instructed 100  each 12   lidocaine-prilocaine (EMLA) cream Apply to affected area once 30 g 3   losartan (COZAAR) 100 MG tablet Take 1 tablet (100 mg total) by mouth daily. 90 tablet 3   MOUNJARO 12.5 MG/0.5ML Pen Inject 12.5 mg into the skin once a week.     Multiple Vitamins-Minerals (ONE-A-DAY MENS 50+ ADVANTAGE) TABS Take 1 tablet by mouth daily.     oxybutynin (DITROPAN) 5 MG tablet Take 1 tablet (5 mg total) by mouth every 8 (eight) hours as needed for bladder  spasms. 30 tablet 1   phenazopyridine (PYRIDIUM) 200 MG tablet Take 1 tablet (200 mg total) by mouth 3 (three) times daily as needed (for pain with urination). 30 tablet 0   POTASSIUM PO Take 1 tablet by mouth daily.     prochlorperazine (COMPAZINE) 10 MG tablet Take 1 tablet (10 mg total) by mouth every 6 (six) hours as needed (Nausea or vomiting). 30 tablet 1   tadalafil (CIALIS) 20 MG tablet TAKE 1 TABLET(20 MG) BY MOUTH DAILY AS NEEDED 30 tablet 3   Vitamin D, Ergocalciferol, (DRISDOL) 1.25 MG (50000 UNIT) CAPS capsule Take 1 capsule (50,000 Units total) by mouth every 7 (seven) days. 8 capsule 0   ondansetron (ZOFRAN) 8 MG tablet Take 1 tablet (8 mg total) by mouth every 8 (eight) hours as needed for nausea or vomiting. Start on the third day after cisplatin. (Patient not taking: Reported on 12/20/2022) 30 tablet 1   No current facility-administered medications for this visit.    HISTORY OF PRESENT ILLNESS:   Oncology History  Bladder cancer (HCC)  08/28/2022 Imaging   1. Contrast enhancing, lobulated endoluminal mass of the anterior bladder dome, measuring 2.6 x 2.0 x 1.2 cm, consistent with primary bladder malignancy. 2. Prominent varices about the bladder dome and anterior bladder, without overt evidence of extramural tumor extent. 3. No evidence of lymphadenopathy or metastatic disease in the abdomen or pelvis. 4. Prostatomegaly.   09/11/2022 Initial Diagnosis   Bladder cancer [facility]) At age 75 year old found to have a solid enhancing 2.5 cm anterior bladder dome mass on CT hematuria protocol on 08/28/2022 during evaluation for gross hematuria.    09/26/2022 Pathology Results   A. BLADDER TUMOR, SUPERFICIAL MARGIN DOME, TURBT:  High grade papillary urothelial carcinoma with inverted growth pattern  The carcinoma invades muscularis propria   B. BLADDER TUMOR, DEEP MARGIN DOME, TURBT:  Invasive high grade papillary urothelial carcinoma  Muscularis propria (detrusor muscle) is  present and not involved by  carcinoma   C. BLADDER TUMOR, INFERIOR MARGIN, BIOPSY:  Submucosa and muscularis propria is present and not involved by  carcinoma    09/26/2022 Procedure   1.  Cystoscopy with TURBT (medium) 2.  Intravesical instillation of gemcitabine 3.  Cystogram with intraoperative interpretation of fluoroscopic imaging   11/22/2022 -  Chemotherapy   Patient is on Treatment Plan : BLADDER DOSE DENSE MVAC q14d         REVIEW OF SYSTEMS:   All relevant systems were reviewed with the patient and are negative.   VITALS:  Blood pressure (!) 148/89, pulse 85, temperature 98.4 F (36.9 C), temperature source Oral, resp. rate 17, weight (!) 323 lb 12.8 oz (146.9 kg), SpO2 100%.  Wt Readings from Last 3 Encounters:  12/20/22 (!) 323 lb 12.8 oz (146.9 kg)  12/07/22 (!) 329 lb 3.2 oz (149.3 kg)  12/06/22 (!) 329 lb (149.2 kg)    Body mass index is 34.7 kg/m.  Performance status (ECOG): 0 -  Asymptomatic  PHYSICAL EXAM:   GENERAL:alert, no distress and comfortable EYES: normal, sclera clear OROPHARYNX: no exudate, no erythema    NECK: supple,  non-tender, without nodularity LYMPH:  no palpable cervical lymphadenopathy LUNGS: clear to auscultation with normal breathing effort.  No wheeze or rales HEART: regular rate & rhythm and no murmurs and no lower extremity edema ABDOMEN: abdomen soft, non-tender and nondistended Musculoskeletal: no edema NEURO: alert  LABORATORY DATA:  I have reviewed the data as listed    Component Value Date/Time   NA 139 12/06/2022 0754   NA 142 08/27/2022 1535   K 3.9 12/06/2022 0754   CL 105 12/06/2022 0754   CO2 26 12/06/2022 0754   GLUCOSE 326 (H) 12/06/2022 0754   BUN 20 12/06/2022 0754   BUN 20 08/27/2022 1535   CREATININE 1.17 12/06/2022 0754   CREATININE 1.50 (H) 02/18/2018 1306   CALCIUM 9.2 12/06/2022 0754   PROT 6.5 12/06/2022 0754   PROT 7.2 08/27/2022 1535   ALBUMIN 3.9 12/06/2022 0754   ALBUMIN 4.5 08/27/2022  1535   AST 14 (L) 12/06/2022 0754   ALT 24 12/06/2022 0754   ALKPHOS 66 12/06/2022 0754   BILITOT 0.2 12/06/2022 0754   GFRNONAA >60 12/06/2022 0754   GFRAA 75 01/12/2020 1056    No results found for: "SPEP", "UPEP"  Lab Results  Component Value Date   WBC 7.3 12/20/2022   NEUTROABS 4.5 12/20/2022   HGB 11.7 (L) 12/20/2022   HCT 34.8 (L) 12/20/2022   MCV 88.1 12/20/2022   PLT 195 12/20/2022      Chemistry      Component Value Date/Time   NA 139 12/06/2022 0754   NA 142 08/27/2022 1535   K 3.9 12/06/2022 0754   CL 105 12/06/2022 0754   CO2 26 12/06/2022 0754   BUN 20 12/06/2022 0754   BUN 20 08/27/2022 1535   CREATININE 1.17 12/06/2022 0754   CREATININE 1.50 (H) 02/18/2018 1306      Component Value Date/Time   CALCIUM 9.2 12/06/2022 0754   ALKPHOS 66 12/06/2022 0754   AST 14 (L) 12/06/2022 0754   ALT 24 12/06/2022 0754   BILITOT 0.2 12/06/2022 0754       RADIOGRAPHIC STUDIES: I have personally reviewed the radiological images as listed and agreed with the findings in the report. No results found.

## 2022-12-18 NOTE — Assessment & Plan Note (Signed)
 Compazine 10 mg twice daily on day 1-3 of each cycle, then as needed Echo at baseline normal Monitor for neutropenic fever and signs of infection Hydration 64 to 70+ ounces per day CBC with differential, CMP 1 day before each cycle Monitored for the hearing loss, neuropathy, renal function closely  Port in place. Infection precautions.

## 2022-12-20 ENCOUNTER — Other Ambulatory Visit: Payer: Medicare Other

## 2022-12-20 ENCOUNTER — Inpatient Hospital Stay: Payer: Medicare Other

## 2022-12-20 ENCOUNTER — Inpatient Hospital Stay (HOSPITAL_BASED_OUTPATIENT_CLINIC_OR_DEPARTMENT_OTHER): Payer: Medicare Other

## 2022-12-20 VITALS — BP 132/81 | HR 85 | Temp 98.4°F | Resp 17 | Wt 323.8 lb

## 2022-12-20 VITALS — BP 123/74 | HR 79 | Resp 17

## 2022-12-20 DIAGNOSIS — C678 Malignant neoplasm of overlapping sites of bladder: Secondary | ICD-10-CM

## 2022-12-20 DIAGNOSIS — Z9189 Other specified personal risk factors, not elsewhere classified: Secondary | ICD-10-CM

## 2022-12-20 DIAGNOSIS — C671 Malignant neoplasm of dome of bladder: Secondary | ICD-10-CM | POA: Diagnosis not present

## 2022-12-20 DIAGNOSIS — D649 Anemia, unspecified: Secondary | ICD-10-CM

## 2022-12-20 DIAGNOSIS — Z5111 Encounter for antineoplastic chemotherapy: Secondary | ICD-10-CM | POA: Diagnosis not present

## 2022-12-20 LAB — CBC WITH DIFFERENTIAL (CANCER CENTER ONLY)
Abs Immature Granulocytes: 0.29 10*3/uL — ABNORMAL HIGH (ref 0.00–0.07)
Basophils Absolute: 0 10*3/uL (ref 0.0–0.1)
Basophils Relative: 0 %
Eosinophils Absolute: 0 10*3/uL (ref 0.0–0.5)
Eosinophils Relative: 0 %
HCT: 34.8 % — ABNORMAL LOW (ref 39.0–52.0)
Hemoglobin: 11.7 g/dL — ABNORMAL LOW (ref 13.0–17.0)
Immature Granulocytes: 4 %
Lymphocytes Relative: 25 %
Lymphs Abs: 1.8 10*3/uL (ref 0.7–4.0)
MCH: 29.6 pg (ref 26.0–34.0)
MCHC: 33.6 g/dL (ref 30.0–36.0)
MCV: 88.1 fL (ref 80.0–100.0)
Monocytes Absolute: 0.7 10*3/uL (ref 0.1–1.0)
Monocytes Relative: 10 %
Neutro Abs: 4.5 10*3/uL (ref 1.7–7.7)
Neutrophils Relative %: 61 %
Platelet Count: 195 10*3/uL (ref 150–400)
RBC: 3.95 MIL/uL — ABNORMAL LOW (ref 4.22–5.81)
RDW: 13.7 % (ref 11.5–15.5)
WBC Count: 7.3 10*3/uL (ref 4.0–10.5)
nRBC: 0 % (ref 0.0–0.2)

## 2022-12-20 LAB — MAGNESIUM: Magnesium: 1.7 mg/dL (ref 1.7–2.4)

## 2022-12-20 LAB — CMP (CANCER CENTER ONLY)
ALT: 24 U/L (ref 0–44)
AST: 13 U/L — ABNORMAL LOW (ref 15–41)
Albumin: 3.9 g/dL (ref 3.5–5.0)
Alkaline Phosphatase: 100 U/L (ref 38–126)
Anion gap: 5 (ref 5–15)
BUN: 14 mg/dL (ref 6–20)
CO2: 27 mmol/L (ref 22–32)
Calcium: 9 mg/dL (ref 8.9–10.3)
Chloride: 106 mmol/L (ref 98–111)
Creatinine: 1.01 mg/dL (ref 0.61–1.24)
GFR, Estimated: >60 mL/min (ref >60)
Glucose, Bld: 334 mg/dL — ABNORMAL HIGH (ref 70–99)
Potassium: 4.1 mmol/L (ref 3.5–5.1)
Sodium: 138 mmol/L (ref 135–145)
Total Bilirubin: 0.3 mg/dL (ref <1.2)
Total Protein: 6.5 g/dL (ref 6.5–8.1)

## 2022-12-20 MED ORDER — DEXAMETHASONE SODIUM PHOSPHATE 10 MG/ML IJ SOLN
10.0000 mg | Freq: Once | INTRAMUSCULAR | Status: AC
  Administered 2022-12-20: 10 mg via INTRAVENOUS
  Filled 2022-12-20: qty 1

## 2022-12-20 MED ORDER — ONDANSETRON HCL 4 MG/2ML IJ SOLN
8.0000 mg | Freq: Once | INTRAMUSCULAR | Status: AC
  Administered 2022-12-20: 8 mg via INTRAVENOUS
  Filled 2022-12-20: qty 4

## 2022-12-20 MED ORDER — METHOTREXATE SODIUM CHEMO INJECTION (PF) 50 MG/2ML
30.0000 mg/m2 | Freq: Once | INTRAMUSCULAR | Status: AC
  Administered 2022-12-20: 87.5 mg via INTRAVENOUS
  Filled 2022-12-20: qty 3.5

## 2022-12-20 MED ORDER — SODIUM CHLORIDE 0.9% FLUSH
10.0000 mL | INTRAVENOUS | Status: DC | PRN
  Administered 2022-12-20: 10 mL

## 2022-12-20 MED ORDER — SODIUM CHLORIDE 0.9% FLUSH
10.0000 mL | Freq: Once | INTRAVENOUS | Status: AC
  Administered 2022-12-20: 10 mL

## 2022-12-20 MED ORDER — HEPARIN SOD (PORK) LOCK FLUSH 100 UNIT/ML IV SOLN
500.0000 [IU] | Freq: Once | INTRAVENOUS | Status: AC | PRN
  Administered 2022-12-20: 500 [IU]

## 2022-12-20 MED ORDER — SODIUM CHLORIDE 0.9 % IV SOLN: Freq: Once | INTRAVENOUS | Status: AC

## 2022-12-20 MED FILL — Fosaprepitant Dimeglumine For IV Infusion 150 MG (Base Eq): INTRAVENOUS | Qty: 5 | Status: AC

## 2022-12-20 NOTE — Patient Instructions (Signed)
[place] [facility]  Discharge Instructions: Thank you for choosing [facility] to provide your oncology and hematology care.   If you have a lab appointment with the Cancer Center, please go directly to the Cancer Center and check in at the registration area.   Wear comfortable clothing and clothing appropriate for easy access to any Portacath or PICC line.   We strive to give you quality time with your provider. You may need to reschedule your appointment if you arrive late (15 or more minutes).  Arriving late affects you and other patients whose appointments are after yours.  Also, if you miss three or more appointments without notifying the office, you may be dismissed from the clinic at the provider's discretion.      For prescription refill requests, have your pharmacy contact our office and allow 72 hours for refills to be completed.    Today you received the following chemotherapy and/or immunotherapy agents: Methotrexate      To help prevent nausea and vomiting after your treatment, we encourage you to take your nausea medication as directed.  BELOW ARE SYMPTOMS THAT SHOULD BE REPORTED IMMEDIATELY: *FEVER GREATER THAN 100.4 F (38 C) OR HIGHER *CHILLS OR SWEATING *NAUSEA AND VOMITING THAT IS NOT CONTROLLED WITH YOUR NAUSEA MEDICATION *UNUSUAL SHORTNESS OF BREATH *UNUSUAL BRUISING OR BLEEDING *URINARY PROBLEMS (pain or burning when urinating, or frequent urination) *BOWEL PROBLEMS (unusual diarrhea, constipation, pain near the anus) TENDERNESS IN MOUTH AND THROAT WITH OR WITHOUT PRESENCE OF ULCERS (sore throat, sores in mouth, or a toothache) UNUSUAL RASH, SWELLING OR PAIN  UNUSUAL VAGINAL DISCHARGE OR ITCHING   Items with * indicate a potential emergency and should be followed up as soon as possible or go to the Emergency Department if any problems should occur.  Please show the CHEMOTHERAPY ALERT CARD or  IMMUNOTHERAPY ALERT CARD at check-in to the Emergency Department and triage nurse.  Should you have questions after your visit or need to cancel or reschedule your appointment, please contact [facility] [facility] - A DEPT OF [facility]  Dept: [phone]  and follow the prompts.  Office hours are 8:00 a.m. to 4:30 p.m. Monday - Friday. Please note that voicemails left after 4:00 p.m. may not be returned until the following business day.  We are closed weekends and major holidays. You have access to a nurse at all times for urgent questions. Please call the main number to the clinic Dept: [phone] and follow the prompts.   For any non-urgent questions, you may also contact your provider using MyChart. We now offer e-Visits for anyone 24 and older to request care online for non-urgent symptoms. For details visit [email or web address].   Also download the MyChart app! Go to the app store, search "MyChart", open the app, select [place], and log in with your MyChart username and password.

## 2022-12-21 ENCOUNTER — Inpatient Hospital Stay: Payer: Medicare Other

## 2022-12-21 VITALS — BP 157/89 | HR 87 | Temp 97.8°F | Resp 16

## 2022-12-21 DIAGNOSIS — Z5111 Encounter for antineoplastic chemotherapy: Secondary | ICD-10-CM | POA: Diagnosis not present

## 2022-12-21 DIAGNOSIS — C678 Malignant neoplasm of overlapping sites of bladder: Secondary | ICD-10-CM

## 2022-12-21 MED ORDER — SODIUM CHLORIDE 0.9 % IV SOLN: Freq: Once | INTRAVENOUS | Status: AC

## 2022-12-21 MED ORDER — PALONOSETRON HCL INJECTION 0.25 MG/5ML
0.2500 mg | Freq: Once | INTRAVENOUS | Status: AC
  Administered 2022-12-21: 0.25 mg via INTRAVENOUS
  Filled 2022-12-21: qty 5

## 2022-12-21 MED ORDER — HEPARIN SOD (PORK) LOCK FLUSH 100 UNIT/ML IV SOLN
500.0000 [IU] | Freq: Once | INTRAVENOUS | Status: AC | PRN
  Administered 2022-12-21: 500 [IU]

## 2022-12-21 MED ORDER — SODIUM CHLORIDE 0.9 % IV SOLN
150.0000 mg | Freq: Once | INTRAVENOUS | Status: AC
  Administered 2022-12-21: 150 mg via INTRAVENOUS
  Filled 2022-12-21: qty 150

## 2022-12-21 MED ORDER — MAGNESIUM SULFATE 2 GM/50ML IV SOLN
2.0000 g | Freq: Once | INTRAVENOUS | Status: AC
  Administered 2022-12-21: 2 g via INTRAVENOUS
  Filled 2022-12-21: qty 50

## 2022-12-21 MED ORDER — DOXORUBICIN HCL CHEMO IV INJECTION 2 MG/ML
30.0000 mg/m2 | Freq: Once | INTRAVENOUS | Status: AC
  Administered 2022-12-21: 88 mg via INTRAVENOUS
  Filled 2022-12-21: qty 44

## 2022-12-21 MED ORDER — POTASSIUM CHLORIDE IN NACL 20-0.9 MEQ/L-% IV SOLN: Freq: Once | INTRAVENOUS | Status: AC

## 2022-12-21 MED ORDER — SODIUM CHLORIDE 0.9% FLUSH
10.0000 mL | INTRAVENOUS | Status: DC | PRN
  Administered 2022-12-21: 10 mL

## 2022-12-21 MED ORDER — VINBLASTINE SULFATE CHEMO INJECTION 1 MG/ML
3.0000 mg/m2 | Freq: Once | INTRAVENOUS | Status: AC
  Administered 2022-12-21: 8.8 mg via INTRAVENOUS
  Filled 2022-12-21: qty 8.8

## 2022-12-21 MED ORDER — SODIUM CHLORIDE 0.9 % IV SOLN
70.0000 mg/m2 | Freq: Once | INTRAVENOUS | Status: AC
  Administered 2022-12-21: 200 mg via INTRAVENOUS
  Filled 2022-12-21: qty 200

## 2022-12-21 MED ORDER — DEXAMETHASONE SODIUM PHOSPHATE 10 MG/ML IJ SOLN
10.0000 mg | Freq: Once | INTRAMUSCULAR | Status: AC
  Administered 2022-12-21: 10 mg via INTRAVENOUS
  Filled 2022-12-21: qty 1

## 2022-12-21 NOTE — Patient Instructions (Addendum)
[place] [facility]  Discharge Instructions: Thank you for choosing [facility] to provide your oncology and hematology care.   If you have a lab appointment with the Cancer Center, please go directly to the Cancer Center and check in at the registration area.   Wear comfortable clothing and clothing appropriate for easy access to any Portacath or PICC line.   We strive to give you quality time with your provider. You may need to reschedule your appointment if you arrive late (15 or more minutes).  Arriving late affects you and other patients whose appointments are after yours.  Also, if you miss three or more appointments without notifying the office, you may be dismissed from the clinic at the provider's discretion.      For prescription refill requests, have your pharmacy contact our office and allow 72 hours for refills to be completed.    Today you received the following chemotherapy and/or immunotherapy agents Adriamycin, Vinblastine, Cisplatin   To help prevent nausea and vomiting after your treatment, we encourage you to take your nausea medication as directed.  BELOW ARE SYMPTOMS THAT SHOULD BE REPORTED IMMEDIATELY: *FEVER GREATER THAN 100.4 F (38 C) OR HIGHER *CHILLS OR SWEATING *NAUSEA AND VOMITING THAT IS NOT CONTROLLED WITH YOUR NAUSEA MEDICATION *UNUSUAL SHORTNESS OF BREATH *UNUSUAL BRUISING OR BLEEDING *URINARY PROBLEMS (pain or burning when urinating, or frequent urination) *BOWEL PROBLEMS (unusual diarrhea, constipation, pain near the anus) TENDERNESS IN MOUTH AND THROAT WITH OR WITHOUT PRESENCE OF ULCERS (sore throat, sores in mouth, or a toothache) UNUSUAL RASH, SWELLING OR PAIN  UNUSUAL VAGINAL DISCHARGE OR ITCHING   Items with * indicate a potential emergency and should be followed up as soon as possible or go to the Emergency Department if any problems should occur.  Please show the CHEMOTHERAPY ALERT  CARD or IMMUNOTHERAPY ALERT CARD at check-in to the Emergency Department and triage nurse.  Should you have questions after your visit or need to cancel or reschedule your appointment, please contact [place] [facility] - A DEPT OF [facility]  Dept: [phone]  and follow the prompts.  Office hours are 8:00 a.m. to 4:30 p.m. Monday - Friday. Please note that voicemails left after 4:00 p.m. may not be returned until the following business day.  We are closed weekends and major holidays. You have access to a nurse at all times for urgent questions. Please call the main number to the clinic Dept: [phone] and follow the prompts.   For any non-urgent questions, you may also contact your provider using MyChart. We now offer e-Visits for anyone 1 and older to request care online for non-urgent symptoms. For details visit [email or web address].   Also download the MyChart app! Go to the app store, search "MyChart", open the app, select , and log in with your MyChart username and password.

## 2022-12-24 ENCOUNTER — Inpatient Hospital Stay: Payer: Medicare Other

## 2022-12-24 VITALS — BP 118/78 | HR 85 | Temp 98.4°F | Resp 18

## 2022-12-24 DIAGNOSIS — Z5111 Encounter for antineoplastic chemotherapy: Secondary | ICD-10-CM | POA: Diagnosis not present

## 2022-12-24 DIAGNOSIS — C678 Malignant neoplasm of overlapping sites of bladder: Secondary | ICD-10-CM

## 2022-12-24 MED ORDER — PEGFILGRASTIM-JMDB 6 MG/0.6ML ~~LOC~~ SOSY
6.0000 mg | PREFILLED_SYRINGE | Freq: Once | SUBCUTANEOUS | Status: AC
Start: 2022-12-24 — End: 2022-12-24
  Administered 2022-12-24: 6 mg via SUBCUTANEOUS
  Filled 2022-12-24: qty 0.6

## 2023-01-02 NOTE — Progress Notes (Unsigned)
Patient Care Team: Brad Erp, MD as PCP - General (Family Medicine) [name], Brad Ping, [name], [facility] Brad Sartorius, [name]  Clinic Day:  01/03/2023  Referring physician: Melven Sartorius, MD  ASSESSMENT & PLAN:   Assessment & Plan: Brad [name] is a 62 y.o.male with history of hypertension, CVA, type 2 diabetes, OSA and history of smoking quit 7 years ago prior to diagnosis [facility] muscle invasive bladder cancer.   Tolerated cycle 3 very well.   Current diagnosis: T2 MIBC Urologist: Dr. Cristal Deer [name] CrCl >60 Current treatment: ddMVAC Methotrexate 30 mg/m2 Vinblastine 3 mg/m2 Doxorubicin 30 mg/m2  Cisplatin 70 mg/m2  Fulphila  Bladder cancer (HCC) C4 ddMVAC this week. Plan for 6 cycles or as tolerated. Repeat every 2 weeks as able See me before cycle 5 with lab appointment one hour before visit. Repeat every 2 weeks  At risk for side effect of medication Compazine 10 mg twice daily on day 1-3 of each cycle, then as needed Echo at baseline normal Monitor for neutropenic fever and signs of infection Hydration 64 to 70+ ounces per day CBC with differential, CMP 1 day before each cycle Monitored for the hearing loss, neuropathy, renal function closely  Port in place. Infection precautions.  Normocytic anemia Mild. Ok to continue treatment  Thrombocytopenia (HCC) Mild. Ok to continue treatment  Hypomagnesemia 2 gm Mg Sulfate today  Repeat labs tomorrow  Hyperglycemia 10 units of Aspart today Repeat labs tomorrow Continue home Mounjaro Decrease dex dose to 4 mg in infusion and 2 mg twice daily on days 3-5    The patient understands the plans discussed today and is in agreement with them.  He knows to contact our office if he develops concerns prior to his next appointment.  Brad Sartorius, MD  [facility] [name]  MED ONC - A DEPT OF [facility] [address] [name] [address] Dept: [phone] Dept Fax: [phone]   Orders Placed This Encounter  Procedures   Magnesium    Standing Status:   Future    Standing Expiration Date:   01/04/2024   Basic Metabolic Panel - Cancer Center Only    Standing Status:   Future    Standing Expiration Date:   01/04/2024      CHIEF COMPLAINT:  CC: MIBC  Current Treatment:  ddMVAC  INTERVAL HISTORY:  Brad [name] is here today for repeat clinical assessment. He denies fevers or chills. He denies neuropathic pain. His appetite is good.  Rash between the thigh started Saturday and some bleeding. No bleeding anymore.  No chest pain, coughing, short of breath, constipation, stomach pain. Bowel movement is normal. No bloody or dark stool and not bloody urine.  I have reviewed the past medical history, past surgical history, social history and family history with the patient and they are unchanged from previous note.  ALLERGIES:  has No Known Allergies.  MEDICATIONS:  Current Outpatient Medications  Medication Sig Dispense Refill   amLODipine (NORVASC) 10 MG tablet Take 1 tablet (10 mg total) by mouth daily. 90 tablet 3   Ascorbic Acid (VITAMIN C PO) Take 1 tablet by mouth daily.     blood glucose meter kit and supplies KIT Dispense based on patient and insurance preference. Check fasting blood sugar once daily ICD10 R73.03 1 each 0   chlorthalidone (HYGROTON) 25 MG tablet Take 1  tablet (25 mg total) by mouth daily. 30 tablet 2   CRESTOR 5 MG tablet Take 5 mg by mouth 2 (two) times a week.     dexamethasone (DECADRON) 4 MG tablet Take 1 tab (4 mg) by mouth daily x 3 days starting the day after cisplatin chemotherapy. Take with food.     glucose blood test strip Use as instructed to monitor FSBS 1x daily. Dx: R73.09 100 each 12   labetalol (NORMODYNE) 200 MG tablet TAKE 1 TABLET(200 MG) BY MOUTH TWICE DAILY 180 tablet 3   Lancets  (ONETOUCH ULTRASOFT) lancets Use as instructed 100 each 12   lidocaine-prilocaine (EMLA) cream Apply to affected area once 30 g 3   losartan (COZAAR) 100 MG tablet Take 1 tablet (100 mg total) by mouth daily. 90 tablet 3   MOUNJARO 12.5 MG/0.5ML Pen Inject 12.5 mg into the skin once a week.     Multiple Vitamins-Minerals (ONE-A-DAY MENS 50+ ADVANTAGE) TABS Take 1 tablet by mouth daily.     ondansetron (ZOFRAN) 8 MG tablet Take 1 tablet (8 mg total) by mouth every 8 (eight) hours as needed for nausea or vomiting. Start on the third day after cisplatin. (Patient not taking: Reported on 12/20/2022) 30 tablet 1   oxybutynin (DITROPAN) 5 MG tablet Take 1 tablet (5 mg total) by mouth every 8 (eight) hours as needed for bladder spasms. 30 tablet 1   phenazopyridine (PYRIDIUM) 200 MG tablet Take 1 tablet (200 mg total) by mouth 3 (three) times daily as needed (for pain with urination). 30 tablet 0   POTASSIUM PO Take 1 tablet by mouth daily.     prochlorperazine (COMPAZINE) 10 MG tablet Take 1 tablet (10 mg total) by mouth every 6 (six) hours as needed (Nausea or vomiting). 30 tablet 1   tadalafil (CIALIS) 20 MG tablet TAKE 1 TABLET(20 MG) BY MOUTH DAILY AS NEEDED 30 tablet 3   Vitamin D, Ergocalciferol, (DRISDOL) 1.25 MG (50000 UNIT) CAPS capsule Take 1 capsule (50,000 Units total) by mouth every 7 (seven) days. 8 capsule 0   No current facility-administered medications for this visit.    HISTORY OF PRESENT ILLNESS:   Oncology History  Bladder cancer (HCC)  08/28/2022 Imaging   1. Contrast enhancing, lobulated endoluminal mass of the anterior bladder dome, measuring 2.6 x 2.0 x 1.2 cm, consistent with primary bladder malignancy. 2. Prominent varices about the bladder dome and anterior bladder, without overt evidence of extramural tumor extent. 3. No evidence of lymphadenopathy or metastatic disease in the abdomen or pelvis. 4. Prostatomegaly.   09/11/2022 Initial Diagnosis   Bladder cancer  [facility]) At age 89 year old found to have a solid enhancing 2.5 cm anterior bladder dome mass on CT hematuria protocol on 08/28/2022 during evaluation for gross hematuria.    09/26/2022 Pathology Results   A. BLADDER TUMOR, SUPERFICIAL MARGIN DOME, TURBT:  High grade papillary urothelial carcinoma with inverted growth pattern  The carcinoma invades muscularis propria   B. BLADDER TUMOR, DEEP MARGIN DOME, TURBT:  Invasive high grade papillary urothelial carcinoma  Muscularis propria (detrusor muscle) is present and not involved by  carcinoma   C. BLADDER TUMOR, INFERIOR MARGIN, BIOPSY:  Submucosa and muscularis propria is present and not involved by  carcinoma    09/26/2022 Procedure   1.  Cystoscopy with TURBT (medium) 2.  Intravesical instillation of gemcitabine 3.  Cystogram with intraoperative interpretation of fluoroscopic imaging   11/22/2022 -  Chemotherapy   Patient is on Treatment Plan : BLADDER DOSE  DENSE MVAC q14d         REVIEW OF SYSTEMS:   All relevant systems were reviewed with the patient and are negative.   VITALS:  Blood pressure (!) 146/83, pulse 84, temperature 98.1 F (36.7 C), resp. rate 20, weight (!) 311 lb 6.4 oz (141.3 kg), SpO2 100%.  Wt Readings from Last 3 Encounters:  01/03/23 (!) 311 lb 6.4 oz (141.3 kg)  12/20/22 (!) 323 lb 12.8 oz (146.9 kg)  12/07/22 (!) 329 lb 3.2 oz (149.3 kg)    Body mass index is 33.37 kg/m.  Performance status (ECOG): 0 - Asymptomatic  PHYSICAL EXAM:   GENERAL:alert, no distress and comfortable SKIN: skin color normal, rectal rash. EYES: normal, sclera clear OROPHARYNX: no exudate, no erythema    NECK: supple,  non-tender, without nodularity LYMPH:  no palpable cervical lymphadenopathy LUNGS: clear to auscultation with normal breathing effort.  No wheeze or rales HEART: regular rate & rhythm and no murmurs and no lower extremity edema ABDOMEN: abdomen soft, non-tender and nondistended Musculoskeletal: no  edema NEURO: alert, fluent speech  LABORATORY DATA:  I have reviewed the data as listed    Component Value Date/Time   NA 132 (L) 01/03/2023 0823   NA 142 08/27/2022 1535   K 4.3 01/03/2023 0823   CL 101 01/03/2023 0823   CO2 25 01/03/2023 0823   GLUCOSE 437 (H) 01/03/2023 0823   BUN 15 01/03/2023 0823   BUN 20 08/27/2022 1535   CREATININE 1.11 01/03/2023 0823   CREATININE 1.50 (H) 02/18/2018 1306   CALCIUM 8.7 (L) 01/03/2023 0823   PROT 5.9 (L) 01/03/2023 0823   PROT 7.2 08/27/2022 1535   ALBUMIN 3.7 01/03/2023 0823   ALBUMIN 4.5 08/27/2022 1535   AST 13 (L) 01/03/2023 0823   ALT 26 01/03/2023 0823   ALKPHOS 114 01/03/2023 0823   BILITOT 0.3 01/03/2023 0823   GFRNONAA >60 01/03/2023 0823   GFRAA 75 01/12/2020 1056    No results found for: "SPEP", "UPEP"  Lab Results  Component Value Date   WBC 8.6 01/03/2023   NEUTROABS 5.5 01/03/2023   HGB 11.4 (L) 01/03/2023   HCT 33.8 (L) 01/03/2023   MCV 88.0 01/03/2023   PLT 134 (L) 01/03/2023      Chemistry      Component Value Date/Time   NA 132 (L) 01/03/2023 0823   NA 142 08/27/2022 1535   K 4.3 01/03/2023 0823   CL 101 01/03/2023 0823   CO2 25 01/03/2023 0823   BUN 15 01/03/2023 0823   BUN 20 08/27/2022 1535   CREATININE 1.11 01/03/2023 0823   CREATININE 1.50 (H) 02/18/2018 1306      Component Value Date/Time   CALCIUM 8.7 (L) 01/03/2023 0823   ALKPHOS 114 01/03/2023 0823   AST 13 (L) 01/03/2023 0823   ALT 26 01/03/2023 0823   BILITOT 0.3 01/03/2023 0823       RADIOGRAPHIC STUDIES: I have personally reviewed the radiological images as listed and agreed with the findings in the report. No results found.

## 2023-01-02 NOTE — Assessment & Plan Note (Signed)
Decrease sugary beverages Compazine 10 mg twice daily on day 1-3 of each cycle, then as needed Echo at baseline normal Monitor for neutropenic fever and signs of infection Hydration 64 to 70+ ounces per day CBC with differential, CMP 1 day before each cycle Monitored for the hearing loss, neuropathy, renal function closely  Port in place. Infection precautions.

## 2023-01-02 NOTE — Assessment & Plan Note (Signed)
C4 ddMVAC this week. Plan for 6 cycles or as tolerated. Repeat every 2 weeks as able See me before cycle 5 with lab appointment one hour before visit. Repeat every 2 weeks

## 2023-01-03 ENCOUNTER — Inpatient Hospital Stay: Payer: Medicare Other

## 2023-01-03 VITALS — BP 137/88 | HR 73 | Resp 16

## 2023-01-03 VITALS — BP 146/83 | HR 84 | Temp 98.1°F | Resp 20 | Wt 311.4 lb

## 2023-01-03 DIAGNOSIS — Z5111 Encounter for antineoplastic chemotherapy: Secondary | ICD-10-CM | POA: Insufficient documentation

## 2023-01-03 DIAGNOSIS — N400 Enlarged prostate without lower urinary tract symptoms: Secondary | ICD-10-CM | POA: Insufficient documentation

## 2023-01-03 DIAGNOSIS — Z79632 Long term (current) use of antitumor antibiotic: Secondary | ICD-10-CM | POA: Insufficient documentation

## 2023-01-03 DIAGNOSIS — E1165 Type 2 diabetes mellitus with hyperglycemia: Secondary | ICD-10-CM | POA: Diagnosis not present

## 2023-01-03 DIAGNOSIS — Z79631 Long term (current) use of antimetabolite agent: Secondary | ICD-10-CM | POA: Diagnosis not present

## 2023-01-03 DIAGNOSIS — R310 Gross hematuria: Secondary | ICD-10-CM | POA: Diagnosis not present

## 2023-01-03 DIAGNOSIS — Z79630 Long term (current) use of alkylating agent: Secondary | ICD-10-CM | POA: Diagnosis not present

## 2023-01-03 DIAGNOSIS — Z9189 Other specified personal risk factors, not elsewhere classified: Secondary | ICD-10-CM

## 2023-01-03 DIAGNOSIS — C678 Malignant neoplasm of overlapping sites of bladder: Secondary | ICD-10-CM

## 2023-01-03 DIAGNOSIS — Z79633 Long term (current) use of mitotic inhibitor: Secondary | ICD-10-CM | POA: Diagnosis not present

## 2023-01-03 DIAGNOSIS — Z8673 Personal history of transient ischemic attack (TIA), and cerebral infarction without residual deficits: Secondary | ICD-10-CM | POA: Diagnosis not present

## 2023-01-03 DIAGNOSIS — G4733 Obstructive sleep apnea (adult) (pediatric): Secondary | ICD-10-CM | POA: Insufficient documentation

## 2023-01-03 DIAGNOSIS — D649 Anemia, unspecified: Secondary | ICD-10-CM | POA: Insufficient documentation

## 2023-01-03 DIAGNOSIS — Z7952 Long term (current) use of systemic steroids: Secondary | ICD-10-CM | POA: Diagnosis not present

## 2023-01-03 DIAGNOSIS — Z79899 Other long term (current) drug therapy: Secondary | ICD-10-CM | POA: Insufficient documentation

## 2023-01-03 DIAGNOSIS — Z5189 Encounter for other specified aftercare: Secondary | ICD-10-CM | POA: Diagnosis not present

## 2023-01-03 DIAGNOSIS — R739 Hyperglycemia, unspecified: Secondary | ICD-10-CM

## 2023-01-03 DIAGNOSIS — I10 Essential (primary) hypertension: Secondary | ICD-10-CM | POA: Insufficient documentation

## 2023-01-03 DIAGNOSIS — Z87891 Personal history of nicotine dependence: Secondary | ICD-10-CM | POA: Insufficient documentation

## 2023-01-03 DIAGNOSIS — C671 Malignant neoplasm of dome of bladder: Secondary | ICD-10-CM

## 2023-01-03 DIAGNOSIS — D696 Thrombocytopenia, unspecified: Secondary | ICD-10-CM | POA: Diagnosis not present

## 2023-01-03 LAB — CBC WITH DIFFERENTIAL (CANCER CENTER ONLY)
Abs Immature Granulocytes: 0.35 10*3/uL — ABNORMAL HIGH (ref 0.00–0.07)
Basophils Absolute: 0 10*3/uL (ref 0.0–0.1)
Basophils Relative: 1 %
Eosinophils Absolute: 0 10*3/uL (ref 0.0–0.5)
Eosinophils Relative: 0 %
HCT: 33.8 % — ABNORMAL LOW (ref 39.0–52.0)
Hemoglobin: 11.4 g/dL — ABNORMAL LOW (ref 13.0–17.0)
Immature Granulocytes: 4 %
Lymphocytes Relative: 23 %
Lymphs Abs: 2 10*3/uL (ref 0.7–4.0)
MCH: 29.7 pg (ref 26.0–34.0)
MCHC: 33.7 g/dL (ref 30.0–36.0)
MCV: 88 fL (ref 80.0–100.0)
Monocytes Absolute: 0.8 10*3/uL (ref 0.1–1.0)
Monocytes Relative: 9 %
Neutro Abs: 5.5 10*3/uL (ref 1.7–7.7)
Neutrophils Relative %: 63 %
Platelet Count: 134 10*3/uL — ABNORMAL LOW (ref 150–400)
RBC: 3.84 MIL/uL — ABNORMAL LOW (ref 4.22–5.81)
RDW: 13.9 % (ref 11.5–15.5)
WBC Count: 8.6 10*3/uL (ref 4.0–10.5)
nRBC: 0.2 % (ref 0.0–0.2)

## 2023-01-03 LAB — CMP (CANCER CENTER ONLY)
ALT: 26 U/L (ref 0–44)
AST: 13 U/L — ABNORMAL LOW (ref 15–41)
Albumin: 3.7 g/dL (ref 3.5–5.0)
Alkaline Phosphatase: 114 U/L (ref 38–126)
Anion gap: 6 (ref 5–15)
BUN: 15 mg/dL (ref 6–20)
CO2: 25 mmol/L (ref 22–32)
Calcium: 8.7 mg/dL — ABNORMAL LOW (ref 8.9–10.3)
Chloride: 101 mmol/L (ref 98–111)
Creatinine: 1.11 mg/dL (ref 0.61–1.24)
GFR, Estimated: >60 mL/min (ref >60)
Glucose, Bld: 437 mg/dL — ABNORMAL HIGH (ref 70–99)
Potassium: 4.3 mmol/L (ref 3.5–5.1)
Sodium: 132 mmol/L — ABNORMAL LOW (ref 135–145)
Total Bilirubin: 0.3 mg/dL (ref <1.2)
Total Protein: 5.9 g/dL — ABNORMAL LOW (ref 6.5–8.1)

## 2023-01-03 LAB — MAGNESIUM: Magnesium: 1.5 mg/dL — ABNORMAL LOW (ref 1.7–2.4)

## 2023-01-03 MED ORDER — SODIUM CHLORIDE 0.9% FLUSH
10.0000 mL | INTRAVENOUS | Status: DC | PRN
Start: 2023-01-03 — End: 2023-01-03
  Administered 2023-01-03: 10 mL

## 2023-01-03 MED ORDER — SODIUM CHLORIDE 0.9 % IV SOLN: Freq: Once | INTRAVENOUS | Status: AC

## 2023-01-03 MED ORDER — MAGNESIUM SULFATE 2 GM/50ML IV SOLN
2.0000 g | Freq: Once | INTRAVENOUS | Status: AC
  Administered 2023-01-03: 2 g via INTRAVENOUS
  Filled 2023-01-03: qty 50

## 2023-01-03 MED ORDER — DEXAMETHASONE 4 MG PO TABS: ORAL_TABLET | ORAL | Status: DC

## 2023-01-03 MED ORDER — INSULIN ASPART 100 UNIT/ML IJ SOLN
10.0000 [IU] | Freq: Once | INTRAMUSCULAR | Status: AC
Start: 2023-01-03 — End: 2023-01-03
  Administered 2023-01-03: 10 [IU] via SUBCUTANEOUS
  Filled 2023-01-03: qty 1

## 2023-01-03 MED ORDER — SODIUM CHLORIDE 0.9% FLUSH
10.0000 mL | Freq: Once | INTRAVENOUS | Status: AC
Start: 2023-01-03 — End: 2023-01-03
  Administered 2023-01-03: 10 mL

## 2023-01-03 MED ORDER — DEXAMETHASONE SODIUM PHOSPHATE 10 MG/ML IJ SOLN
4.0000 mg | Freq: Once | INTRAMUSCULAR | Status: AC
  Administered 2023-01-03: 4 mg via INTRAVENOUS
  Filled 2023-01-03: qty 1

## 2023-01-03 MED ORDER — ONDANSETRON HCL 4 MG/2ML IJ SOLN
8.0000 mg | Freq: Once | INTRAMUSCULAR | Status: AC
  Administered 2023-01-03: 8 mg via INTRAVENOUS
  Filled 2023-01-03: qty 4

## 2023-01-03 MED ORDER — METHOTREXATE SODIUM CHEMO INJECTION (PF) 50 MG/2ML
30.0000 mg/m2 | Freq: Once | INTRAMUSCULAR | Status: AC
Start: 2023-01-03 — End: 2023-01-03
  Administered 2023-01-03: 87.5 mg via INTRAVENOUS
  Filled 2023-01-03: qty 3.5

## 2023-01-03 MED ORDER — HEPARIN SOD (PORK) LOCK FLUSH 100 UNIT/ML IV SOLN
500.0000 [IU] | Freq: Once | INTRAVENOUS | Status: AC | PRN
  Administered 2023-01-03: 500 [IU]

## 2023-01-03 MED FILL — Fosaprepitant Dimeglumine For IV Infusion 150 MG (Base Eq): INTRAVENOUS | Qty: 5 | Status: AC

## 2023-01-03 NOTE — Assessment & Plan Note (Addendum)
2 gm Mg Sulfate today  Repeat labs tomorrow

## 2023-01-03 NOTE — Patient Instructions (Signed)
[facility] - [facility]   Discharge Instructions: Thank you for choosing [facility] to provide your oncology and hematology care.   If you have a lab appointment with the Cancer Center, please go directly to the Cancer Center and check in at the registration area.   Wear comfortable clothing and clothing appropriate for easy access to any Portacath or PICC line.   We strive to give you quality time with your provider. You may need to reschedule your appointment if you arrive late (15 or more minutes).  Arriving late affects you and other patients whose appointments are after yours.  Also, if you miss three or more appointments without notifying the office, you may be dismissed from the clinic at the provider's discretion.      For prescription refill requests, have your pharmacy contact our office and allow 72 hours for refills to be completed.    Today you received the following chemotherapy and/or immunotherapy agents: Methotrexate      To help prevent nausea and vomiting after your treatment, we encourage you to take your nausea medication as directed.  BELOW ARE SYMPTOMS THAT SHOULD BE REPORTED IMMEDIATELY: *FEVER GREATER THAN 100.4 F (38 C) OR HIGHER *CHILLS OR SWEATING *NAUSEA AND VOMITING THAT IS NOT CONTROLLED WITH YOUR NAUSEA MEDICATION *UNUSUAL SHORTNESS OF BREATH *UNUSUAL BRUISING OR BLEEDING *URINARY PROBLEMS (pain or burning when urinating, or frequent urination) *BOWEL PROBLEMS (unusual diarrhea, constipation, pain near the anus) TENDERNESS IN MOUTH AND THROAT WITH OR WITHOUT PRESENCE OF ULCERS (sore throat, sores in mouth, or a toothache) UNUSUAL RASH, SWELLING OR PAIN  UNUSUAL VAGINAL DISCHARGE OR ITCHING   Items with * indicate a potential emergency and should be followed up as soon as possible or go to the Emergency Department if any problems should occur.  Please show the CHEMOTHERAPY ALERT CARD or  IMMUNOTHERAPY ALERT CARD at check-in to the Emergency Department and triage nurse.  Should you have questions after your visit or need to cancel or reschedule your appointment, please contact [facility] - [facility]  Dept: [phone]  and follow the prompts.  Office hours are 8:00 a.m. to 4:30 p.m. Monday - Friday. Please note that voicemails left after 4:00 p.m. may not be returned until the following business day.  We are closed weekends and major holidays. You have access to a nurse at all times for urgent questions. Please call the main number to the clinic Dept: [phone] and follow the prompts.   For any non-urgent questions, you may also contact your provider using MyChart. We now offer e-Visits for anyone 24 and older to request care online for non-urgent symptoms. For details visit [email or web address].   Also download the MyChart app! Go to the app store, search "MyChart", open the app, select [place], and log in with your MyChart username and password.

## 2023-01-03 NOTE — Assessment & Plan Note (Signed)
Mild. Ok to continue treatment

## 2023-01-03 NOTE — Assessment & Plan Note (Signed)
10 units of Aspart today Repeat labs tomorrow Continue home Mounjaro Decrease dex dose to 4 mg in infusion and 2 mg twice daily on days 3-5

## 2023-01-04 ENCOUNTER — Other Ambulatory Visit: Payer: Medicare Other

## 2023-01-04 ENCOUNTER — Inpatient Hospital Stay: Payer: Medicare Other

## 2023-01-04 ENCOUNTER — Telehealth: Payer: Self-pay

## 2023-01-04 ENCOUNTER — Other Ambulatory Visit: Payer: Self-pay

## 2023-01-04 VITALS — BP 132/80 | HR 74 | Temp 97.9°F | Resp 18

## 2023-01-04 DIAGNOSIS — C678 Malignant neoplasm of overlapping sites of bladder: Secondary | ICD-10-CM

## 2023-01-04 DIAGNOSIS — Z5111 Encounter for antineoplastic chemotherapy: Secondary | ICD-10-CM | POA: Diagnosis not present

## 2023-01-04 LAB — CBC WITH DIFFERENTIAL/PLATELET
Abs Immature Granulocytes: 0.22 10*3/uL — ABNORMAL HIGH (ref 0.00–0.07)
Basophils Absolute: 0 10*3/uL (ref 0.0–0.1)
Basophils Relative: 0 %
Eosinophils Absolute: 0 10*3/uL (ref 0.0–0.5)
Eosinophils Relative: 0 %
HCT: 33.3 % — ABNORMAL LOW (ref 39.0–52.0)
Hemoglobin: 11.3 g/dL — ABNORMAL LOW (ref 13.0–17.0)
Immature Granulocytes: 2 %
Lymphocytes Relative: 21 %
Lymphs Abs: 2.1 10*3/uL (ref 0.7–4.0)
MCH: 29.5 pg (ref 26.0–34.0)
MCHC: 33.9 g/dL (ref 30.0–36.0)
MCV: 86.9 fL (ref 80.0–100.0)
Monocytes Absolute: 1 10*3/uL (ref 0.1–1.0)
Monocytes Relative: 9 %
Neutro Abs: 6.9 10*3/uL (ref 1.7–7.7)
Neutrophils Relative %: 68 %
Platelets: 141 10*3/uL — ABNORMAL LOW (ref 150–400)
RBC: 3.83 MIL/uL — ABNORMAL LOW (ref 4.22–5.81)
RDW: 13.9 % (ref 11.5–15.5)
WBC: 10.3 10*3/uL (ref 4.0–10.5)
nRBC: 0.2 % (ref 0.0–0.2)

## 2023-01-04 LAB — COMPREHENSIVE METABOLIC PANEL
ALT: 26 U/L (ref 0–44)
AST: 14 U/L — ABNORMAL LOW (ref 15–41)
Albumin: 3.9 g/dL (ref 3.5–5.0)
Alkaline Phosphatase: 107 U/L (ref 38–126)
Anion gap: 6 (ref 5–15)
BUN: 15 mg/dL (ref 6–20)
CO2: 26 mmol/L (ref 22–32)
Calcium: 9 mg/dL (ref 8.9–10.3)
Chloride: 100 mmol/L (ref 98–111)
Creatinine, Ser: 1.05 mg/dL (ref 0.61–1.24)
GFR, Estimated: >60 mL/min (ref >60)
Glucose, Bld: 354 mg/dL — ABNORMAL HIGH (ref 70–99)
Potassium: 4 mmol/L (ref 3.5–5.1)
Sodium: 132 mmol/L — ABNORMAL LOW (ref 135–145)
Total Bilirubin: 0.4 mg/dL (ref <1.2)
Total Protein: 6.1 g/dL — ABNORMAL LOW (ref 6.5–8.1)

## 2023-01-04 LAB — MAGNESIUM: Magnesium: 1.6 mg/dL — ABNORMAL LOW (ref 1.7–2.4)

## 2023-01-04 MED ORDER — MAGNESIUM SULFATE 2 GM/50ML IV SOLN
2.0000 g | Freq: Once | INTRAVENOUS | Status: AC
  Administered 2023-01-04: 2 g via INTRAVENOUS
  Filled 2023-01-04: qty 50

## 2023-01-04 MED ORDER — SODIUM CHLORIDE 0.9 % IV SOLN: Freq: Once | INTRAVENOUS | Status: AC

## 2023-01-04 MED ORDER — SODIUM CHLORIDE 0.9 % IV SOLN
70.0000 mg/m2 | Freq: Once | INTRAVENOUS | Status: AC
  Administered 2023-01-04: 200 mg via INTRAVENOUS
  Filled 2023-01-04: qty 200

## 2023-01-04 MED ORDER — HEPARIN SOD (PORK) LOCK FLUSH 100 UNIT/ML IV SOLN
500.0000 [IU] | Freq: Once | INTRAVENOUS | Status: AC | PRN
  Administered 2023-01-04: 500 [IU]

## 2023-01-04 MED ORDER — POTASSIUM CHLORIDE IN NACL 20-0.9 MEQ/L-% IV SOLN
Freq: Once | INTRAVENOUS | Status: AC
  Filled 2023-01-04: qty 1000

## 2023-01-04 MED ORDER — PALONOSETRON HCL INJECTION 0.25 MG/5ML
0.2500 mg | Freq: Once | INTRAVENOUS | Status: AC
  Administered 2023-01-04: 0.25 mg via INTRAVENOUS
  Filled 2023-01-04: qty 5

## 2023-01-04 MED ORDER — SODIUM CHLORIDE 0.9% FLUSH
10.0000 mL | Freq: Once | INTRAVENOUS | Status: AC
  Administered 2023-01-04: 10 mL

## 2023-01-04 MED ORDER — DOXORUBICIN HCL CHEMO IV INJECTION 2 MG/ML
30.0000 mg/m2 | Freq: Once | INTRAVENOUS | Status: AC
  Administered 2023-01-04: 88 mg via INTRAVENOUS
  Filled 2023-01-04: qty 44

## 2023-01-04 MED ORDER — SODIUM CHLORIDE 0.9% FLUSH
10.0000 mL | INTRAVENOUS | Status: DC | PRN
  Administered 2023-01-04: 10 mL

## 2023-01-04 MED ORDER — VINBLASTINE SULFATE CHEMO INJECTION 1 MG/ML
3.0000 mg/m2 | Freq: Once | INTRAVENOUS | Status: AC
  Administered 2023-01-04: 8.8 mg via INTRAVENOUS
  Filled 2023-01-04: qty 8.8

## 2023-01-04 MED ORDER — DEXAMETHASONE SODIUM PHOSPHATE 10 MG/ML IJ SOLN
4.0000 mg | Freq: Once | INTRAMUSCULAR | Status: AC
  Administered 2023-01-04: 4 mg via INTRAVENOUS
  Filled 2023-01-04: qty 1

## 2023-01-04 MED ORDER — SODIUM CHLORIDE 0.9 % IV SOLN
150.0000 mg | Freq: Once | INTRAVENOUS | Status: AC
  Administered 2023-01-04: 150 mg via INTRAVENOUS
  Filled 2023-01-04: qty 150

## 2023-01-04 NOTE — Telephone Encounter (Signed)
-----   Message from Melven Sartorius sent at 01/04/2023  1:26 PM EST ----- Please let Zan's wife know to take SlowMag OTC two tabs twice a day starting today. Thank you

## 2023-01-04 NOTE — Progress Notes (Signed)
Magnesium sulfate added. Repeat labs on Monday added to orders.

## 2023-01-04 NOTE — Telephone Encounter (Signed)
Pt notified of SlowMag OTC medication ordered as below by Dr. Cherly Hensen via Geronimo Boot, RN in infusion (where pt is currently located). Dr. Cherly Hensen requests that pt have labs on Monday morning. Port flush/lab appointment made for 12/9 at 9:15. Pt made aware of this as well.

## 2023-01-04 NOTE — Patient Instructions (Signed)
[facility]  Discharge Instructions: Thank you for choosing [facility] to provide your oncology and hematology care.   If you have a lab appointment with the Cancer Center, please go directly to the Cancer Center and check in at the registration area.   Wear comfortable clothing and clothing appropriate for easy access to any Portacath or PICC line.   We strive to give you quality time with your provider. You may need to reschedule your appointment if you arrive late (15 or more minutes).  Arriving late affects you and other patients whose appointments are after yours.  Also, if you miss three or more appointments without notifying the office, you may be dismissed from the clinic at the provider's discretion.      For prescription refill requests, have your pharmacy contact our office and allow 72 hours for refills to be completed.    Today you received the following chemotherapy and/or immunotherapy agents: vinblastine, doxorubicin, and cisplatin      To help prevent nausea and vomiting after your treatment, we encourage you to take your nausea medication as directed.  BELOW ARE SYMPTOMS THAT SHOULD BE REPORTED IMMEDIATELY: *FEVER GREATER THAN 100.4 F (38 C) OR HIGHER *CHILLS OR SWEATING *NAUSEA AND VOMITING THAT IS NOT CONTROLLED WITH YOUR NAUSEA MEDICATION *UNUSUAL SHORTNESS OF BREATH *UNUSUAL BRUISING OR BLEEDING *URINARY PROBLEMS (pain or burning when urinating, or frequent urination) *BOWEL PROBLEMS (unusual diarrhea, constipation, pain near the anus) TENDERNESS IN MOUTH AND THROAT WITH OR WITHOUT PRESENCE OF ULCERS (sore throat, sores in mouth, or a toothache) UNUSUAL RASH, SWELLING OR PAIN  UNUSUAL VAGINAL DISCHARGE OR ITCHING   Items with * indicate a potential emergency and should be followed up as soon as possible or go to the Emergency Department if any problems should occur.  Please show the  CHEMOTHERAPY ALERT CARD or IMMUNOTHERAPY ALERT CARD at check-in to the Emergency Department and triage nurse.  Should you have questions after your visit or need to cancel or reschedule your appointment, please contact [facility] - [facility]  Dept: [phone]  and follow the prompts.  Office hours are 8:00 a.m. to 4:30 p.m. Monday - Friday. Please note that voicemails left after 4:00 p.m. may not be returned until the following business day.  We are closed weekends and major holidays. You have access to a nurse at all times for urgent questions. Please call the main number to the clinic Dept: [phone] and follow the prompts.   For any non-urgent questions, you may also contact your provider using MyChart. We now offer e-Visits for anyone 10 and older to request care online for non-urgent symptoms. For details visit [email or web address].   Also download the MyChart app! Go to the app store, search "MyChart", open the app, select [place], and log in with your MyChart username and password.

## 2023-01-07 ENCOUNTER — Inpatient Hospital Stay: Payer: Medicare Other

## 2023-01-07 ENCOUNTER — Other Ambulatory Visit: Payer: Self-pay | Admitting: *Deleted

## 2023-01-07 ENCOUNTER — Telehealth: Payer: Self-pay | Admitting: *Deleted

## 2023-01-07 VITALS — BP 135/89 | HR 75 | Temp 98.4°F | Resp 18

## 2023-01-07 DIAGNOSIS — C678 Malignant neoplasm of overlapping sites of bladder: Secondary | ICD-10-CM

## 2023-01-07 DIAGNOSIS — C671 Malignant neoplasm of dome of bladder: Secondary | ICD-10-CM

## 2023-01-07 DIAGNOSIS — Z5111 Encounter for antineoplastic chemotherapy: Secondary | ICD-10-CM | POA: Diagnosis not present

## 2023-01-07 LAB — CBC WITH DIFFERENTIAL (CANCER CENTER ONLY)
Abs Immature Granulocytes: 0.02 10*3/uL (ref 0.00–0.07)
Basophils Absolute: 0 10*3/uL (ref 0.0–0.1)
Basophils Relative: 0 %
Eosinophils Absolute: 0 10*3/uL (ref 0.0–0.5)
Eosinophils Relative: 0 %
HCT: 32.8 % — ABNORMAL LOW (ref 39.0–52.0)
Hemoglobin: 11.3 g/dL — ABNORMAL LOW (ref 13.0–17.0)
Immature Granulocytes: 0 %
Lymphocytes Relative: 37 %
Lymphs Abs: 1.8 10*3/uL (ref 0.7–4.0)
MCH: 30 pg (ref 26.0–34.0)
MCHC: 34.5 g/dL (ref 30.0–36.0)
MCV: 87 fL (ref 80.0–100.0)
Monocytes Absolute: 0.2 10*3/uL (ref 0.1–1.0)
Monocytes Relative: 3 %
Neutro Abs: 2.8 10*3/uL (ref 1.7–7.7)
Neutrophils Relative %: 60 %
Platelet Count: 164 10*3/uL (ref 150–400)
RBC: 3.77 MIL/uL — ABNORMAL LOW (ref 4.22–5.81)
RDW: 13.9 % (ref 11.5–15.5)
WBC Count: 4.8 10*3/uL (ref 4.0–10.5)
nRBC: 0 % (ref 0.0–0.2)

## 2023-01-07 LAB — BASIC METABOLIC PANEL - CANCER CENTER ONLY
Anion gap: 7 (ref 5–15)
BUN: 20 mg/dL (ref 6–20)
CO2: 27 mmol/L (ref 22–32)
Calcium: 9.1 mg/dL (ref 8.9–10.3)
Chloride: 100 mmol/L (ref 98–111)
Creatinine: 1.05 mg/dL (ref 0.61–1.24)
GFR, Estimated: >60 mL/min (ref >60)
Glucose, Bld: 291 mg/dL — ABNORMAL HIGH (ref 70–99)
Potassium: 3.9 mmol/L (ref 3.5–5.1)
Sodium: 134 mmol/L — ABNORMAL LOW (ref 135–145)

## 2023-01-07 LAB — MAGNESIUM: Magnesium: 1.3 mg/dL — ABNORMAL LOW (ref 1.7–2.4)

## 2023-01-07 MED ORDER — HEPARIN SOD (PORK) LOCK FLUSH 100 UNIT/ML IV SOLN
500.0000 [IU] | Freq: Once | INTRAVENOUS | Status: AC
Start: 2023-01-07 — End: 2023-01-07
  Administered 2023-01-07: 500 [IU]

## 2023-01-07 MED ORDER — MAGNESIUM SULFATE 2 GM/50ML IV SOLN
2.0000 g | Freq: Once | INTRAVENOUS | Status: AC
Start: 2023-01-07 — End: 2023-01-07
  Administered 2023-01-07: 2 g via INTRAVENOUS
  Filled 2023-01-07: qty 50

## 2023-01-07 MED ORDER — SODIUM CHLORIDE 0.9 % IV SOLN: Freq: Once | INTRAVENOUS | Status: AC

## 2023-01-07 MED ORDER — SODIUM CHLORIDE 0.9% FLUSH
10.0000 mL | Freq: Once | INTRAVENOUS | Status: AC
Start: 2023-01-07 — End: 2023-01-07
  Administered 2023-01-07: 10 mL via INTRAVENOUS

## 2023-01-07 MED ORDER — SODIUM CHLORIDE 0.9% FLUSH
10.0000 mL | Freq: Once | INTRAVENOUS | Status: AC
Start: 2023-01-07 — End: 2023-01-07
  Administered 2023-01-07: 10 mL

## 2023-01-07 MED ORDER — PEGFILGRASTIM-JMDB 6 MG/0.6ML ~~LOC~~ SOSY
6.0000 mg | PREFILLED_SYRINGE | Freq: Once | SUBCUTANEOUS | Status: AC
  Administered 2023-01-07: 6 mg via SUBCUTANEOUS
  Filled 2023-01-07: qty 0.6

## 2023-01-07 MED ORDER — HEPARIN SOD (PORK) LOCK FLUSH 100 UNIT/ML IV SOLN
500.0000 [IU] | Freq: Once | INTRAVENOUS | Status: AC
  Administered 2023-01-07: 500 [IU] via INTRAVENOUS

## 2023-01-07 NOTE — Patient Instructions (Signed)
Magnesium Sulfate Injection What is this medication? MAGNESIUM SULFATE (mag NEE zee um SUL fate) prevents and treats low levels of magnesium in your body. It may also be used to prevent and treat seizures during pregnancy in people with high blood pressure disorders, such as preeclampsia or eclampsia. Magnesium plays an important role in maintaining the health of your muscles and nervous system. This medicine may be used for other purposes; ask your health care provider or pharmacist if you have questions. What should I tell my care team before I take this medication? They need to know if you have any of these conditions: Heart disease History of irregular heart beat Kidney disease An unusual or allergic reaction to magnesium sulfate, medications, foods, dyes, or preservatives Pregnant or trying to get pregnant Breast-feeding How should I use this medication? This medication is for infusion into a vein. It is given in a hospital or clinic setting. Talk to your care team about the use of this medication in children. While this medication may be prescribed for selected conditions, precautions do apply. Overdosage: If you think you have taken too much of this medicine contact a poison control center or emergency room at once. NOTE: This medicine is only for you. Do not share this medicine with others. What if I miss a dose? This does not apply. What may interact with this medication? Certain medications for anxiety or sleep Certain medications for seizures, such phenobarbital Digoxin Medications that relax muscles for surgery Narcotic medications for pain This list may not describe all possible interactions. Give your health care provider a list of all the medicines, herbs, non-prescription drugs, or dietary supplements you use. Also tell them if you smoke, drink alcohol, or use illegal drugs. Some items may interact with your medicine. What should I watch for while using this  medication? Your condition will be monitored carefully while you are receiving this medication. You may need blood work done while you are receiving this medication. What side effects may I notice from receiving this medication? Side effects that you should report to your care team as soon as possible: Allergic reactions--skin rash, itching, hives, swelling of the face, lips, tongue, or throat High magnesium level--confusion, drowsiness, facial flushing, redness, sweating, muscle weakness, fast or irregular heartbeat, trouble breathing Low blood pressure--dizziness, feeling faint or lightheaded, blurry vision Side effects that usually do not require medical attention (report to your care team if they continue or are bothersome): Headache Nausea This list may not describe all possible side effects. Call your doctor for medical advice about side effects. You may report side effects to FDA at 1-800-FDA-1088. Where should I keep my medication? This medication is given in a hospital or clinic and will not be stored at home. NOTE: This sheet is a summary. It may not cover all possible information. If you have questions about this medicine, talk to your doctor, pharmacist, or health care provider. Hypomagnesemia Hypomagnesemia is a condition in which the level of magnesium in the blood is too low. Magnesium is a mineral that is found in many foods. It is used in many different processes in the body. Hypomagnesemia can affect every organ in the body. In severe cases, it can cause life-threatening problems. What are the causes? This condition may be caused by: Not getting enough magnesium in your diet or not having enough healthy foods to eat (malnutrition). Problems with magnesium absorption in the intestines. Dehydration. Excessive use of alcohol. Vomiting. Severe or long-term (chronic) diarrhea. Some medicines, including  medicines that make you urinate more often (diuretics). Certain diseases,  such as kidney disease, diabetes, celiac disease, and overactive thyroid. What are the signs or symptoms? Symptoms of this condition include: Loss of appetite, nausea, and vomiting. Involuntary shaking or trembling of a body part (tremor). Muscle weakness or tingling in the arms and legs. Sudden tightening of muscles (muscle spasms). Confusion. Psychiatric issues, such as: Depression and irritability. Psychosis. A feeling of fluttering of the heart (palpitations). Seizures. These symptoms are more severe if magnesium levels drop suddenly. How is this diagnosed? This condition may be diagnosed based on: Your symptoms and medical history. A physical exam. Blood and urine tests. How is this treated? Treatment depends on the cause and the severity of the condition. It may be treated by: Taking a magnesium supplement. This can be taken in pill form. If the condition is severe, magnesium is usually given through an IV. Making changes to your diet. You may be directed to eat foods that have a lot of magnesium, such as green leafy vegetables, peas, beans, and nuts. Not drinking alcohol. If you are struggling not to drink, ask your health care provider for help. Follow these instructions at home: Eating and drinking     Make sure that your diet includes foods with magnesium. Foods that have a lot of magnesium in them include: Green leafy vegetables, such as spinach and broccoli. Beans and peas. Nuts and seeds, such as almonds and sunflower seeds. Whole grains, such as whole grain bread and fortified cereals. Drink fluids that contain salts and minerals (electrolytes), such as sports drinks, when you are active. Do not drink alcohol. General instructions Take over-the-counter and prescription medicines only as told by your health care provider. Take magnesium supplements as directed if your health care provider tells you to take them. Have your magnesium levels monitored as told by your  health care provider. Keep all follow-up visits. This is important. Contact a health care provider if: You get worse instead of better. Your symptoms return. Get help right away if: You develop severe muscle weakness. You have trouble breathing. You feel that your heart is racing. These symptoms may represent a serious problem that is an emergency. Do not wait to see if the symptoms will go away. Get medical help right away. Call your local emergency services (911 in the [place] Do not drive yourself to the hospital. Summary Hypomagnesemia is a condition in which the level of magnesium in the blood is too low. Hypomagnesemia can affect every organ in the body. Treatment may include eating more foods that contain magnesium, taking magnesium supplements, and not drinking alcohol. Have your magnesium levels monitored as told by your health care provider. This information is not intended to replace advice given to you by your health care provider. Make sure you discuss any questions you have with your health care provider. Document Revised: 06/14/2020 Document Reviewed: 06/14/2020 Elsevier Patient Education  2024 Elsevier Inc.   2024 Elsevier/Gold Standard (2020-09-28 00:00:00)

## 2023-01-07 NOTE — Telephone Encounter (Signed)
Notified that Magnesium is very low. Needs to have IV Mag today. Coming at 1230 for an infusion. Confirms that he is taking Slo mag at home as prescribed. No diarrhea this weekend.

## 2023-01-08 ENCOUNTER — Other Ambulatory Visit: Payer: Self-pay

## 2023-01-08 NOTE — Progress Notes (Signed)
Repeat Mg on Thursday recommended.

## 2023-01-09 ENCOUNTER — Telehealth: Payer: Self-pay

## 2023-01-09 NOTE — Telephone Encounter (Signed)
TC to check on patient per request of Dr. Cherly Hensen (see note below). Pt states he is feeling fine and has no complaints. Scheduled for port-flush/lab appt tomorrow at 11:00 to have Magnesium level checked--pt aware.

## 2023-01-09 NOTE — Telephone Encounter (Signed)
-----   Message from Melven Sartorius sent at 01/08/2023  5:46 PM EST ----- Angelica Chessman Would you check on Brad [name] on Wed and see how he is doing and have him get Magnesium lab repeated on Thursday anytime. Mg ordered. Thanks

## 2023-01-10 ENCOUNTER — Inpatient Hospital Stay: Payer: Medicare Other

## 2023-01-10 ENCOUNTER — Other Ambulatory Visit: Payer: Self-pay

## 2023-01-10 ENCOUNTER — Telehealth: Payer: Self-pay

## 2023-01-10 DIAGNOSIS — C678 Malignant neoplasm of overlapping sites of bladder: Secondary | ICD-10-CM

## 2023-01-10 DIAGNOSIS — Z5111 Encounter for antineoplastic chemotherapy: Secondary | ICD-10-CM | POA: Diagnosis not present

## 2023-01-10 LAB — MAGNESIUM: Magnesium: 1.3 mg/dL — ABNORMAL LOW (ref 1.7–2.4)

## 2023-01-10 MED ORDER — SODIUM CHLORIDE 0.9% FLUSH
10.0000 mL | Freq: Once | INTRAVENOUS | Status: AC
  Administered 2023-01-10: 10 mL

## 2023-01-10 MED ORDER — MAGNESIUM SULFATE 2 GM/50ML IV SOLN: 2.0000 g | Freq: Once | INTRAVENOUS | Status: DC

## 2023-01-10 MED ORDER — HEPARIN SOD (PORK) LOCK FLUSH 100 UNIT/ML IV SOLN
500.0000 [IU] | Freq: Once | INTRAVENOUS | Status: AC
Start: 2023-01-10 — End: 2023-01-10
  Administered 2023-01-10: 500 [IU]

## 2023-01-10 NOTE — Telephone Encounter (Signed)
-----   Message from Melven Sartorius sent at 01/10/2023  1:24 PM EST ----- Angelica Chessman please let him know Magnesium is low. Take Slow Mag 2 tabs 3 times a day. Give precautions that if start having chest discomfort, irregular heart beats, palpitation report to ED for evaluation. If feeling fine, continue oral mag and repeat lab next week. Would you add an appointment for Mg follow up IV magnesium 2 gm on Monday? Thanks.

## 2023-01-10 NOTE — Telephone Encounter (Signed)
TC to inform patient of below orders by Dr. Cherly Hensen. Pt aware to increase Slow Mag to 2 tabs TID. Magnesium infusion to be given in [facility] on Monday at 10:00. Pt aware. Advised pt of complications of low magnesium level and instructed him to go to the ED if he experiences severe tremors or seizure activity, heart palpitations or cp, or any other unusual s/s. He verbalizes understanding.

## 2023-01-10 NOTE — Addendum Note (Signed)
Addended by: Merlene Laughter B on: 01/10/2023 04:33 PM   Modules accepted: Orders

## 2023-01-10 NOTE — Addendum Note (Signed)
Addended by: Orland Mustard C on: 01/10/2023 04:41 PM   Modules accepted: Orders

## 2023-01-11 ENCOUNTER — Telehealth: Payer: Self-pay

## 2023-01-11 NOTE — Telephone Encounter (Signed)
I called and spoke to the patient regarding hypomagnesemia.  He was taking magnesium twice a day.  He started taking 2 tab 3 times a day yesterday.  Overall he is feeling well.  He denies any palpitation, heart racing sensation, discomfort, or other symptoms.  No diarrhea or vomiting.  He denies any difficulty with urination.  Discussed if developing above symptoms, report to ED. continue Slow-Mag 2 tabs 3 times a day.  Repeat labs on Monday.  If no improvement, will consider discontinuing chemotherapy.

## 2023-01-14 ENCOUNTER — Other Ambulatory Visit: Payer: Medicare Other

## 2023-01-14 ENCOUNTER — Inpatient Hospital Stay: Payer: Medicare Other

## 2023-01-14 VITALS — BP 152/91 | HR 75 | Temp 97.7°F | Resp 16 | Wt 309.6 lb

## 2023-01-14 DIAGNOSIS — C678 Malignant neoplasm of overlapping sites of bladder: Secondary | ICD-10-CM

## 2023-01-14 DIAGNOSIS — Z5111 Encounter for antineoplastic chemotherapy: Secondary | ICD-10-CM | POA: Diagnosis not present

## 2023-01-14 LAB — MAGNESIUM: Magnesium: 1.5 mg/dL — ABNORMAL LOW (ref 1.7–2.4)

## 2023-01-14 MED ORDER — MAGNESIUM SULFATE 2 GM/50ML IV SOLN
2.0000 g | Freq: Once | INTRAVENOUS | Status: AC
Start: 2023-01-14 — End: 2023-01-14
  Administered 2023-01-14: 2 g via INTRAVENOUS
  Filled 2023-01-14: qty 50

## 2023-01-14 MED ORDER — SODIUM CHLORIDE 0.9% FLUSH
10.0000 mL | Freq: Once | INTRAVENOUS | Status: AC
  Administered 2023-01-14: 10 mL

## 2023-01-16 NOTE — Assessment & Plan Note (Signed)
Persistent despite replacement.   Will stop chemotherapy Will replace Mag IV magnesium sulfate 2 g today Continue Slow-Mag 2 tabs twice daily at home Repeat labs on 12/30 and if Magnesium <1.6 will get 2 gm of IV mag sulfate.

## 2023-01-16 NOTE — Progress Notes (Unsigned)
Patient Care Team: Levin Erp, MD as PCP - General (Family Medicine) [name], Deloris Ping, [name], [facility] Melven Sartorius, [name]  Clinic Day:  01/17/2023  Referring physician: Melven Sartorius, MD  ASSESSMENT & PLAN:   Assessment & Plan: Brad [name] is a 62 y.o.male with history of hypertension, CVA, type 2 diabetes, OSA and history of smoking quit 7 years ago prior to diagnosis [facility] bladder [facility].   He completed cycle 4 well. Persistent low magnesium.   Current diagnosis: T2 MIBC Urologist: Dr. Cristal Deer Winter CrCl >60 Current treatment: ddMVAC Methotrexate 30 mg/m2 Vinblastine 3 mg/m2 Doxorubicin 30 mg/m2  Cisplatin 70 mg/m2  Fulphila   Bladder cancer (HCC) Completed C4 ddMVAC. Given the persistent hypomagnesemia, will stop chemotherapy and plan for surgery.  Return in about 3-4 weeks postop  Hypomagnesemia Persistent despite replacement.   Will stop chemotherapy Will replace Mag IV magnesium sulfate 2 g today Continue Slow-Mag 2 tabs twice daily at home Repeat labs on 12/30 and if Magnesium <1.6 will get 2 gm of IV mag sulfate.   Follow up with Dr. Berneice Heinrich in January.  Follow-up with me in late January after his surgical resection.  The patient understands the plans discussed today and is in agreement with them.  He knows to contact our office if he develops concerns prior to his next appointment.  Melven Sartorius, MD  [facility] - A DEPT OF [facility] [address] [place] [address] Dept: [phone] Dept Fax: [phone]   Orders Placed This Encounter  Procedures   CT CHEST ABDOMEN PELVIS W CONTRAST    Standing Status:   Future    Expected Date:   01/31/2023    Expiration Date:   01/17/2024    If indicated for the ordered procedure, I authorize the  administration of contrast media per Radiology protocol:   Yes    Does the patient have a contrast media/X-ray dye allergy?:   No    Preferred imaging location?:   [facility]    If indicated for the ordered procedure, I authorize the administration of oral contrast media per Radiology protocol:   Yes      CHIEF COMPLAINT:  CC: Urothelial carcinoma  Current Treatment:  ddMVAC  INTERVAL HISTORY:  Brad [name] is here today for repeat clinical assessment. He denies fevers or chills.  He has been taking 2 tab of Slow Mag twice daily. He denies diarrhea, nausea, vomiting. Appetite is good. More tired. No difficulty with urination, and no  hematuria. No pelvic pain.  No coughing or short of breath. No neuropathy.  I have reviewed the past medical history, past surgical history, social history and family history with the patient and they are unchanged from previous note.  ALLERGIES:  has no known allergies.  MEDICATIONS:  Current Outpatient Medications  Medication Sig Dispense Refill   amLODipine (NORVASC) 10 MG tablet Take 1 tablet (10 mg total) by mouth daily. 90 tablet 3   Ascorbic Acid (VITAMIN C PO) Take 1 tablet by mouth daily.     blood glucose meter kit and supplies KIT Dispense based on patient and insurance preference. Check fasting blood sugar once daily ICD10 R73.03 1 each 0   chlorthalidone (HYGROTON) 25 MG tablet Take 1 tablet (25 mg total) by mouth daily. 30 tablet 2   CRESTOR  5 MG tablet Take 5 mg by mouth 2 (two) times a week.     dexamethasone (DECADRON) 4 MG tablet Take 1 tab (4 mg) by mouth daily x 3 days starting the day after cisplatin chemotherapy. Take with food.     glucose blood test strip Use as instructed to monitor FSBS 1x daily. Dx: R73.09 100 each 12   labetalol (NORMODYNE) 200 MG tablet TAKE 1 TABLET(200 MG) BY MOUTH TWICE DAILY 180 tablet 3   Lancets (ONETOUCH ULTRASOFT) lancets Use as instructed 100 each 12   lidocaine-prilocaine (EMLA) cream Apply to  affected area once 30 g 3   losartan (COZAAR) 100 MG tablet Take 1 tablet (100 mg total) by mouth daily. 90 tablet 3   Magnesium Citrate (SLOWMAG MG MUSCLE HLTH/RECOVER PO) Take 2 tablets by mouth in the morning, at noon, and at bedtime.     MOUNJARO 12.5 MG/0.5ML Pen Inject 12.5 mg into the skin once a week.     Multiple Vitamins-Minerals (ONE-A-DAY MENS 50+ ADVANTAGE) TABS Take 1 tablet by mouth daily.     ondansetron (ZOFRAN) 8 MG tablet Take 1 tablet (8 mg total) by mouth every 8 (eight) hours as needed for nausea or vomiting. Start on the third day after cisplatin. (Patient not taking: Reported on 12/20/2022) 30 tablet 1   oxybutynin (DITROPAN) 5 MG tablet Take 1 tablet (5 mg total) by mouth every 8 (eight) hours as needed for bladder spasms. 30 tablet 1   phenazopyridine (PYRIDIUM) 200 MG tablet Take 1 tablet (200 mg total) by mouth 3 (three) times daily as needed (for pain with urination). 30 tablet 0   POTASSIUM PO Take 1 tablet by mouth daily.     prochlorperazine (COMPAZINE) 10 MG tablet Take 1 tablet (10 mg total) by mouth every 6 (six) hours as needed (Nausea or vomiting). 30 tablet 1   tadalafil (CIALIS) 20 MG tablet TAKE 1 TABLET(20 MG) BY MOUTH DAILY AS NEEDED 30 tablet 3   Vitamin D, Ergocalciferol, (DRISDOL) 1.25 MG (50000 UNIT) CAPS capsule Take 1 capsule (50,000 Units total) by mouth every 7 (seven) days. 8 capsule 0   No current facility-administered medications for this visit.   Facility-Administered Medications Ordered in Other Visits  Medication Dose Route Frequency Provider Last Rate Last Admin   0.9 %  sodium chloride infusion   Intravenous Continuous Melven Sartorius, MD   Stopped at 01/17/23 1133    HISTORY OF PRESENT ILLNESS:   Oncology History  Bladder cancer (HCC)  08/28/2022 Imaging   1. Contrast enhancing, lobulated endoluminal mass of the anterior bladder dome, measuring 2.6 x 2.0 x 1.2 cm, consistent with primary bladder malignancy. 2. Prominent varices  about the bladder dome and anterior bladder, without overt evidence of extramural tumor extent. 3. No evidence of lymphadenopathy or metastatic disease in the abdomen or pelvis. 4. Prostatomegaly.   09/11/2022 Initial Diagnosis   Bladder cancer [facility]) At age 19 year old found to have a solid enhancing 2.5 cm anterior bladder dome mass on CT hematuria protocol on 08/28/2022 during evaluation for gross hematuria.    09/26/2022 Pathology Results   A. BLADDER TUMOR, SUPERFICIAL MARGIN DOME, TURBT:  High grade papillary urothelial carcinoma with inverted growth pattern  The carcinoma invades muscularis propria   B. BLADDER TUMOR, DEEP MARGIN DOME, TURBT:  Invasive high grade papillary urothelial carcinoma  Muscularis propria (detrusor muscle) is present and not involved by  carcinoma   C. BLADDER TUMOR, INFERIOR MARGIN, BIOPSY:  Submucosa and muscularis propria is  present and not involved by  carcinoma    09/26/2022 Procedure   1.  Cystoscopy with TURBT (medium) 2.  Intravesical instillation of gemcitabine 3.  Cystogram with intraoperative interpretation of fluoroscopic imaging   11/22/2022 -  Chemotherapy   Patient is on Treatment Plan : BLADDER DOSE DENSE MVAC q14d         REVIEW OF SYSTEMS:   All relevant systems were reviewed with the patient and are negative.   VITALS:  Blood pressure (!) 140/82, pulse 80, temperature (!) 97.5 F (36.4 C), temperature source Temporal, resp. rate 16, weight (!) 313 lb 14.4 oz (142.4 kg), SpO2 100%.  Wt Readings from Last 3 Encounters:  01/17/23 (!) 313 lb 14.4 oz (142.4 kg)  01/14/23 (!) 309 lb 9.6 oz (140.4 kg)  01/07/23 (!) 310 lb 8 oz (140.8 kg)    Body mass index is 33.64 kg/m.  Performance status (ECOG): 0 - Asymptomatic  PHYSICAL EXAM:   GENERAL:alert, no distress and comfortable SKIN: skin color normal, no rashes  EYES: normal, sclera clear OROPHARYNX: no exudate, no erythema    NECK: supple, no erythema around Mediport  site LYMPH:  no palpable cervical lymphadenopathy LUNGS: clear to auscultation with normal breathing effort.  No wheeze or rales HEART: regular rate & rhythm and no murmurs and no lower extremity edema ABDOMEN: abdomen soft, non-tender and nondistended Musculoskeletal: no edema NEURO: alert, fluent speech  LABORATORY DATA:  I have reviewed the data as listed    Component Value Date/Time   NA 136 01/17/2023 0843   NA 142 08/27/2022 1535   K 4.3 01/17/2023 0843   CL 103 01/17/2023 0843   CO2 27 01/17/2023 0843   GLUCOSE 361 (H) 01/17/2023 0843   BUN 12 01/17/2023 0843   BUN 20 08/27/2022 1535   CREATININE 1.00 01/17/2023 0843   CREATININE 1.50 (H) 02/18/2018 1306   CALCIUM 8.9 01/17/2023 0843   PROT 5.8 (L) 01/17/2023 0843   PROT 7.2 08/27/2022 1535   ALBUMIN 3.8 01/17/2023 0843   ALBUMIN 4.5 08/27/2022 1535   AST 15 01/17/2023 0843   ALT 30 01/17/2023 0843   ALKPHOS 121 01/17/2023 0843   BILITOT 0.3 01/17/2023 0843   GFRNONAA >60 01/17/2023 0843   GFRAA 75 01/12/2020 1056    No results found for: "SPEP", "UPEP"  Lab Results  Component Value Date   WBC 11.2 (H) 01/17/2023   NEUTROABS 8.0 (H) 01/17/2023   HGB 10.1 (L) 01/17/2023   HCT 30.0 (L) 01/17/2023   MCV 88.5 01/17/2023   PLT 118 (L) 01/17/2023      Chemistry      Component Value Date/Time   NA 136 01/17/2023 0843   NA 142 08/27/2022 1535   K 4.3 01/17/2023 0843   CL 103 01/17/2023 0843   CO2 27 01/17/2023 0843   BUN 12 01/17/2023 0843   BUN 20 08/27/2022 1535   CREATININE 1.00 01/17/2023 0843   CREATININE 1.50 (H) 02/18/2018 1306      Component Value Date/Time   CALCIUM 8.9 01/17/2023 0843   ALKPHOS 121 01/17/2023 0843   AST 15 01/17/2023 0843   ALT 30 01/17/2023 0843   BILITOT 0.3 01/17/2023 0843       RADIOGRAPHIC STUDIES: I have personally reviewed the radiological images as listed and agreed with the findings in the report. No results found.

## 2023-01-16 NOTE — Assessment & Plan Note (Signed)
Completed C4 ddMVAC. Given the persistent hypomagnesemia, will stop chemotherapy and plan for surgery.  Return in about 3-4 weeks postop

## 2023-01-17 ENCOUNTER — Inpatient Hospital Stay: Payer: Medicare Other

## 2023-01-17 VITALS — BP 140/82 | HR 80 | Temp 97.5°F | Resp 16 | Wt 313.9 lb

## 2023-01-17 DIAGNOSIS — Z5111 Encounter for antineoplastic chemotherapy: Secondary | ICD-10-CM | POA: Diagnosis not present

## 2023-01-17 DIAGNOSIS — C678 Malignant neoplasm of overlapping sites of bladder: Secondary | ICD-10-CM

## 2023-01-17 DIAGNOSIS — C679 Malignant neoplasm of bladder, unspecified: Secondary | ICD-10-CM

## 2023-01-17 LAB — CMP (CANCER CENTER ONLY)
ALT: 30 U/L (ref 0–44)
AST: 15 U/L (ref 15–41)
Albumin: 3.8 g/dL (ref 3.5–5.0)
Alkaline Phosphatase: 121 U/L (ref 38–126)
Anion gap: 6 (ref 5–15)
BUN: 12 mg/dL (ref 6–20)
CO2: 27 mmol/L (ref 22–32)
Calcium: 8.9 mg/dL (ref 8.9–10.3)
Chloride: 103 mmol/L (ref 98–111)
Creatinine: 1 mg/dL (ref 0.61–1.24)
GFR, Estimated: >60 mL/min (ref >60)
Glucose, Bld: 361 mg/dL — ABNORMAL HIGH (ref 70–99)
Potassium: 4.3 mmol/L (ref 3.5–5.1)
Sodium: 136 mmol/L (ref 135–145)
Total Bilirubin: 0.3 mg/dL (ref <1.2)
Total Protein: 5.8 g/dL — ABNORMAL LOW (ref 6.5–8.1)

## 2023-01-17 LAB — CBC WITH DIFFERENTIAL (CANCER CENTER ONLY)
Abs Immature Granulocytes: 0.34 10*3/uL — ABNORMAL HIGH (ref 0.00–0.07)
Basophils Absolute: 0 10*3/uL (ref 0.0–0.1)
Basophils Relative: 0 %
Eosinophils Absolute: 0 10*3/uL (ref 0.0–0.5)
Eosinophils Relative: 0 %
HCT: 30 % — ABNORMAL LOW (ref 39.0–52.0)
Hemoglobin: 10.1 g/dL — ABNORMAL LOW (ref 13.0–17.0)
Immature Granulocytes: 3 %
Lymphocytes Relative: 16 %
Lymphs Abs: 1.8 10*3/uL (ref 0.7–4.0)
MCH: 29.8 pg (ref 26.0–34.0)
MCHC: 33.7 g/dL (ref 30.0–36.0)
MCV: 88.5 fL (ref 80.0–100.0)
Monocytes Absolute: 1 10*3/uL (ref 0.1–1.0)
Monocytes Relative: 9 %
Neutro Abs: 8 10*3/uL — ABNORMAL HIGH (ref 1.7–7.7)
Neutrophils Relative %: 72 %
Platelet Count: 118 10*3/uL — ABNORMAL LOW (ref 150–400)
RBC: 3.39 MIL/uL — ABNORMAL LOW (ref 4.22–5.81)
RDW: 15.5 % (ref 11.5–15.5)
WBC Count: 11.2 10*3/uL — ABNORMAL HIGH (ref 4.0–10.5)
nRBC: 0.4 % — ABNORMAL HIGH (ref 0.0–0.2)

## 2023-01-17 LAB — MAGNESIUM: Magnesium: 1.5 mg/dL — ABNORMAL LOW (ref 1.7–2.4)

## 2023-01-17 MED ORDER — SODIUM CHLORIDE 0.9% FLUSH
10.0000 mL | Freq: Once | INTRAVENOUS | Status: AC
Start: 2023-01-17 — End: 2023-01-17
  Administered 2023-01-17: 10 mL

## 2023-01-17 MED ORDER — SODIUM CHLORIDE 0.9% FLUSH
10.0000 mL | Freq: Once | INTRAVENOUS | Status: AC | PRN
  Administered 2023-01-17: 10 mL

## 2023-01-17 MED ORDER — HEPARIN SOD (PORK) LOCK FLUSH 100 UNIT/ML IV SOLN
500.0000 [IU] | Freq: Once | INTRAVENOUS | Status: AC | PRN
Start: 2023-01-17 — End: 2023-01-17
  Administered 2023-01-17: 500 [IU]

## 2023-01-17 MED ORDER — MAGNESIUM SULFATE 2 GM/50ML IV SOLN
2.0000 g | Freq: Once | INTRAVENOUS | Status: AC
Start: 2023-01-17 — End: 2023-01-17
  Administered 2023-01-17: 2 g via INTRAVENOUS
  Filled 2023-01-17: qty 50

## 2023-01-17 MED ORDER — SODIUM CHLORIDE 0.9 % IV SOLN: INTRAVENOUS | Status: DC

## 2023-01-17 NOTE — Addendum Note (Signed)
Addended by: Geanie Berlin on: 01/17/2023 06:03 PM   Modules accepted: Orders

## 2023-01-17 NOTE — Patient Instructions (Signed)
Hypomagnesemia Hypomagnesemia is a condition in which the level of magnesium in the blood is too low. Magnesium is a mineral that is found in many foods. It is used in many different processes in the body. Hypomagnesemia can affect every organ in the body. In severe cases, it can cause life-threatening problems. What are the causes? This condition may be caused by: Not getting enough magnesium in your diet or not having enough healthy foods to eat (malnutrition). Problems with magnesium absorption in the intestines. Dehydration. Excessive use of alcohol. Vomiting. Severe or long-term (chronic) diarrhea. Some medicines, including medicines that make you urinate more often (diuretics). Certain diseases, such as kidney disease, diabetes, celiac disease, and overactive thyroid. What are the signs or symptoms? Symptoms of this condition include: Loss of appetite, nausea, and vomiting. Involuntary shaking or trembling of a body part (tremor). Muscle weakness or tingling in the arms and legs. Sudden tightening of muscles (muscle spasms). Confusion. Psychiatric issues, such as: Depression and irritability. Psychosis. A feeling of fluttering of the heart (palpitations). Seizures. These symptoms are more severe if magnesium levels drop suddenly. How is this diagnosed? This condition may be diagnosed based on: Your symptoms and medical history. A physical exam. Blood and urine tests. How is this treated? Treatment depends on the cause and the severity of the condition. It may be treated by: Taking a magnesium supplement. This can be taken in pill form. If the condition is severe, magnesium is usually given through an IV. Making changes to your diet. You may be directed to eat foods that have a lot of magnesium, such as green leafy vegetables, peas, beans, and nuts. Not drinking alcohol. If you are struggling not to drink, ask your health care provider for help. Follow these instructions at  home: Eating and drinking     Make sure that your diet includes foods with magnesium. Foods that have a lot of magnesium in them include: Green leafy vegetables, such as spinach and broccoli. Beans and peas. Nuts and seeds, such as almonds and sunflower seeds. Whole grains, such as whole grain bread and fortified cereals. Drink fluids that contain salts and minerals (electrolytes), such as sports drinks, when you are active. Do not drink alcohol. General instructions Take over-the-counter and prescription medicines only as told by your health care provider. Take magnesium supplements as directed if your health care provider tells you to take them. Have your magnesium levels monitored as told by your health care provider. Keep all follow-up visits. This is important. Contact a health care provider if: You get worse instead of better. Your symptoms return. Get help right away if: You develop severe muscle weakness. You have trouble breathing. You feel that your heart is racing. These symptoms may represent a serious problem that is an emergency. Do not wait to see if the symptoms will go away. Get medical help right away. Call your local emergency services (911 in the [place] Do not drive yourself to the hospital. Summary Hypomagnesemia is a condition in which the level of magnesium in the blood is too low. Hypomagnesemia can affect every organ in the body. Treatment may include eating more foods that contain magnesium, taking magnesium supplements, and not drinking alcohol. Have your magnesium levels monitored as told by your health care provider. This information is not intended to replace advice given to you by your health care provider. Make sure you discuss any questions you have with your health care provider. Document Revised: 06/14/2020 Document Reviewed: 06/14/2020 Elsevier Patient Education    2024 Elsevier Inc.  

## 2023-01-18 ENCOUNTER — Inpatient Hospital Stay: Payer: Medicare Other

## 2023-01-21 ENCOUNTER — Inpatient Hospital Stay: Payer: Medicare Other

## 2023-01-28 ENCOUNTER — Other Ambulatory Visit: Payer: Self-pay

## 2023-01-29 ENCOUNTER — Ambulatory Visit (HOSPITAL_COMMUNITY)
Admission: RE | Admit: 2023-01-29 | Discharge: 2023-01-29 | Disposition: A | Discharge status: Home / Self Care | Payer: Medicare Other | Source: Ambulatory Visit

## 2023-01-29 DIAGNOSIS — C679 Malignant neoplasm of bladder, unspecified: Secondary | ICD-10-CM | POA: Insufficient documentation

## 2023-01-29 MED ORDER — HEPARIN SOD (PORK) LOCK FLUSH 100 UNIT/ML IV SOLN
INTRAVENOUS | Status: AC
  Filled 2023-01-29: qty 5

## 2023-01-29 MED ORDER — IOHEXOL 300 MG/ML  SOLN
100.0000 mL | Freq: Once | INTRAMUSCULAR | Status: AC | PRN
  Administered 2023-01-29: 100 mL via INTRAVENOUS

## 2023-01-29 MED ORDER — HEPARIN SOD (PORK) LOCK FLUSH 100 UNIT/ML IV SOLN
500.0000 [IU] | Freq: Once | INTRAVENOUS | Status: AC
Start: 2023-01-29 — End: 2023-01-29
  Administered 2023-01-29: 500 [IU] via INTRAVENOUS

## 2023-01-31 ENCOUNTER — Inpatient Hospital Stay: Payer: Medicare Other

## 2023-01-31 ENCOUNTER — Ambulatory Visit: Payer: Medicare Other

## 2023-01-31 VITALS — BP 140/88 | HR 77 | Temp 97.9°F | Resp 17 | Wt 308.2 lb

## 2023-01-31 DIAGNOSIS — E669 Obesity, unspecified: Secondary | ICD-10-CM | POA: Insufficient documentation

## 2023-01-31 DIAGNOSIS — C678 Malignant neoplasm of overlapping sites of bladder: Secondary | ICD-10-CM | POA: Diagnosis not present

## 2023-01-31 DIAGNOSIS — Z8673 Personal history of transient ischemic attack (TIA), and cerebral infarction without residual deficits: Secondary | ICD-10-CM | POA: Diagnosis not present

## 2023-01-31 DIAGNOSIS — R310 Gross hematuria: Secondary | ICD-10-CM | POA: Diagnosis not present

## 2023-01-31 DIAGNOSIS — N400 Enlarged prostate without lower urinary tract symptoms: Secondary | ICD-10-CM | POA: Diagnosis not present

## 2023-01-31 DIAGNOSIS — Z79899 Other long term (current) drug therapy: Secondary | ICD-10-CM | POA: Insufficient documentation

## 2023-01-31 DIAGNOSIS — Z87891 Personal history of nicotine dependence: Secondary | ICD-10-CM | POA: Insufficient documentation

## 2023-01-31 DIAGNOSIS — E119 Type 2 diabetes mellitus without complications: Secondary | ICD-10-CM | POA: Insufficient documentation

## 2023-01-31 LAB — BASIC METABOLIC PANEL - CANCER CENTER ONLY
Anion gap: 6 (ref 5–15)
BUN: 20 mg/dL (ref 6–20)
CO2: 26 mmol/L (ref 22–32)
Calcium: 9.1 mg/dL (ref 8.9–10.3)
Chloride: 105 mmol/L (ref 98–111)
Creatinine: 1.19 mg/dL (ref 0.61–1.24)
GFR, Estimated: >60 mL/min (ref >60)
Glucose, Bld: 365 mg/dL — ABNORMAL HIGH (ref 70–99)
Potassium: 4 mmol/L (ref 3.5–5.1)
Sodium: 137 mmol/L (ref 135–145)

## 2023-01-31 LAB — MAGNESIUM: Magnesium: 1.7 mg/dL (ref 1.7–2.4)

## 2023-01-31 MED ORDER — HEPARIN SOD (PORK) LOCK FLUSH 100 UNIT/ML IV SOLN
500.0000 [IU] | Freq: Once | INTRAVENOUS | Status: AC
  Administered 2023-01-31: 500 [IU]

## 2023-01-31 MED ORDER — SODIUM CHLORIDE 0.9% FLUSH
10.0000 mL | Freq: Once | INTRAVENOUS | Status: AC
Start: 2023-01-31 — End: 2023-01-31
  Administered 2023-01-31: 10 mL

## 2023-01-31 NOTE — Assessment & Plan Note (Signed)
 Completed C4 ddMVAC Return in Feb after postop to go over pathology and further plan

## 2023-01-31 NOTE — Assessment & Plan Note (Signed)
 Repeat Mg in a week

## 2023-01-31 NOTE — Progress Notes (Signed)
 Patient Care Team: Christia Budds, MD as PCP - General (Family Medicine) [name], Aleene PARAS, [name], [place] [facility] [name] Pauletta BROCKS, [name]  Clinic Day:  01/31/2023  Referring physician: [name] Pauletta BROCKS, MD  ASSESSMENT & PLAN:   Assessment & Plan: Bladder cancer [facility]) Completed C4 ddMVAC Return in Feb after postop to go over pathology and further plan  Hypomagnesemia Repeat Mg in a week   Will keep Mediport for now as he may need adjuvant therapy.  Will follow-up after pathology is available in February.  Patient will follow-up with Dr. Alvaro next week.  See me about a month after surgery. The patient understands the plans discussed today and is in agreement with them.  He knows to contact our office if he develops concerns prior to his next appointment.  Pauletta BROCKS [name], MD  [facility] - A DEPT OF [facility] [address] [place] [address] Dept: [phone] Dept Fax: [phone]   Orders Placed This Encounter  Procedures   CBC with Differential (Cancer Center Only)    Standing Status:   Future    Expiration Date:   01/31/2024   Magnesium     Standing Status:   Future    Expiration Date:   01/31/2024      CHIEF COMPLAINT:  CC: MIBC  Current Treatment: Completed neoadjuvant therapy  INTERVAL HISTORY:  Brad [name] is here today for repeat clinical assessment. He denies fevers or chills. His appetite is good.   He is taking slow Mag twice a day. No difficulty urinating and no hematuria, dysuria. Bowel movement has been normal. No chest pain, heart racing, short of cough or cough. I have reviewed the past medical history, past surgical history, social history and family history with the patient and they are unchanged from previous note.  ALLERGIES:  has no known allergies.  MEDICATIONS:  Current Outpatient Medications   Medication Sig Dispense Refill   amLODipine  (NORVASC ) 10 MG tablet Take 1 tablet (10 mg total) by mouth daily. 90 tablet 3   Ascorbic Acid (VITAMIN C PO) Take 1 tablet by mouth daily.     blood glucose meter kit and supplies KIT Dispense based on patient and insurance preference. Check fasting blood sugar once daily ICD10 R73.03 1 each 0   chlorthalidone  (HYGROTON ) 25 MG tablet Take 1 tablet (25 mg total) by mouth daily. 30 tablet 2   CRESTOR 5 MG tablet Take 5 mg by mouth 2 (two) times a week.     glucose blood test strip Use as instructed to monitor FSBS 1x daily. Dx: R73.09 100 each 12   labetalol  (NORMODYNE ) 200 MG tablet TAKE 1 TABLET(200 MG) BY MOUTH TWICE DAILY 180 tablet 3   Lancets (ONETOUCH ULTRASOFT) lancets Use as instructed 100 each 12   losartan  (COZAAR ) 100 MG tablet Take 1 tablet (100 mg total) by mouth daily. 90 tablet 3   Magnesium  Citrate (SLOWMAG MG MUSCLE HLTH/RECOVER PO) Take 2 tablets by mouth in the morning, at noon, and at bedtime.     MOUNJARO  12.5 MG/0.5ML Pen Inject 12.5 mg into the skin once a week.     Multiple Vitamins-Minerals (ONE-A-DAY MENS 50+ ADVANTAGE) TABS Take 1 tablet by mouth daily.     oxybutynin  (DITROPAN ) 5 MG tablet Take 1 tablet (5 mg total) by mouth every 8 (eight) hours as needed for bladder spasms. 30 tablet 1  phenazopyridine  (PYRIDIUM ) 200 MG tablet Take 1 tablet (200 mg total) by mouth 3 (three) times daily as needed (for pain with urination). 30 tablet 0   POTASSIUM PO Take 1 tablet by mouth daily.     tadalafil  (CIALIS ) 20 MG tablet TAKE 1 TABLET(20 MG) BY MOUTH DAILY AS NEEDED 30 tablet 3   Vitamin D , Ergocalciferol , (DRISDOL ) 1.25 MG (50000 UNIT) CAPS capsule Take 1 capsule (50,000 Units total) by mouth every 7 (seven) days. 8 capsule 0   No current facility-administered medications for this visit.    HISTORY OF PRESENT ILLNESS:   Oncology History  Bladder cancer (HCC)  08/28/2022 Imaging   1. Contrast enhancing, lobulated  endoluminal mass of the anterior bladder dome, measuring 2.6 x 2.0 x 1.2 cm, consistent with primary bladder malignancy. 2. Prominent varices about the bladder dome and anterior bladder, without overt evidence of extramural tumor extent. 3. No evidence of lymphadenopathy or metastatic disease in the abdomen or pelvis. 4. Prostatomegaly.   09/11/2022 Initial Diagnosis   Bladder cancer [facility]) At age 63 year old found to have a solid enhancing 2.5 cm anterior bladder dome mass on CT hematuria protocol on 08/28/2022 during evaluation for gross hematuria.    09/26/2022 Pathology Results   A. BLADDER TUMOR, SUPERFICIAL MARGIN DOME, TURBT:  High grade papillary urothelial carcinoma with inverted growth pattern  The carcinoma invades muscularis propria   B. BLADDER TUMOR, DEEP MARGIN DOME, TURBT:  Invasive high grade papillary urothelial carcinoma  Muscularis propria (detrusor muscle) is present and not involved by  carcinoma   C. BLADDER TUMOR, INFERIOR MARGIN, BIOPSY:  Submucosa and muscularis propria is present and not involved by  carcinoma    09/26/2022 Procedure   1.  Cystoscopy with TURBT (medium) 2.  Intravesical instillation of gemcitabine  3.  Cystogram with intraoperative interpretation of fluoroscopic imaging   11/22/2022 - 01/07/2023 Chemotherapy   Patient is on Treatment Plan : BLADDER DOSE DENSE MVAC q14d       Past Medical History:  Diagnosis Date   Abnormal ultrasound of carotid artery 04/2014   no significant obstruction   Ataxia 04/2014   Diabetes (HCC)    EKG abnormalities 04/2014   poor R wave progression, NSR   Former smoker    25 years x 1.5 ppd, stopped 04/2014   Gait disturbance 04/2014   s/p stroke   H/O echocardiogram 04/2014   moderate LVH, 60-65% EF, no valve disease   History of heart attack    Hypertension    Hypertensive heart disease 04/2014   Intraparenchymal hemorrhage of brain [facility]) 05/05/2014   hospitalization [facility]    Myocardial infarction [facility]    Obesity    Prediabetes    Short-term memory loss 04/2014   Sleep apnea    Stroke (HCC) 04/2014     REVIEW OF SYSTEMS:   All relevant systems were reviewed with the patient and are negative.   VITALS:  Blood pressure (!) 140/88, pulse 77, temperature 97.9 F (36.6 C), temperature source Temporal, resp. rate 17, weight (!) 308 lb 3.2 oz (139.8 kg), SpO2 100%.  Wt Readings from Last 3 Encounters:  01/31/23 (!) 308 lb 3.2 oz (139.8 kg)  01/17/23 (!) 313 lb 14.4 oz (142.4 kg)  01/14/23 (!) 309 lb 9.6 oz (140.4 kg)    Body mass index is 33.03 kg/m.  Performance status (ECOG): 0 - Asymptomatic  PHYSICAL EXAM:   GENERAL:alert, no distress and comfortable   LABORATORY DATA:  I have reviewed the data as  listed    Component Value Date/Time   NA 137 01/31/2023 0800   NA 142 08/27/2022 1535   K 4.0 01/31/2023 0800   CL 105 01/31/2023 0800   CO2 26 01/31/2023 0800   GLUCOSE 365 (H) 01/31/2023 0800   BUN 20 01/31/2023 0800   BUN 20 08/27/2022 1535   CREATININE 1.19 01/31/2023 0800   CREATININE 1.50 (H) 02/18/2018 1306   CALCIUM  9.1 01/31/2023 0800   PROT 5.8 (L) 01/17/2023 0843   PROT 7.2 08/27/2022 1535   ALBUMIN 3.8 01/17/2023 0843   ALBUMIN 4.5 08/27/2022 1535   AST 15 01/17/2023 0843   ALT 30 01/17/2023 0843   ALKPHOS 121 01/17/2023 0843   BILITOT 0.3 01/17/2023 0843   GFRNONAA >60 01/31/2023 0800   GFRAA 75 01/12/2020 1056    No results found for: SPEP, UPEP  Lab Results  Component Value Date   WBC 11.2 (H) 01/17/2023   NEUTROABS 8.0 (H) 01/17/2023   HGB 10.1 (L) 01/17/2023   HCT 30.0 (L) 01/17/2023   MCV 88.5 01/17/2023   PLT 118 (L) 01/17/2023      Chemistry      Component Value Date/Time   NA 137 01/31/2023 0800   NA 142 08/27/2022 1535   K 4.0 01/31/2023 0800   CL 105 01/31/2023 0800   CO2 26 01/31/2023 0800   BUN 20 01/31/2023 0800   BUN 20 08/27/2022 1535   CREATININE 1.19 01/31/2023 0800   CREATININE  1.50 (H) 02/18/2018 1306      Component Value Date/Time   CALCIUM  9.1 01/31/2023 0800   ALKPHOS 121 01/17/2023 0843   AST 15 01/17/2023 0843   ALT 30 01/17/2023 0843   BILITOT 0.3 01/17/2023 0843       RADIOGRAPHIC STUDIES: I have personally reviewed the radiological images as listed and agreed with the findings in the report. No results found.

## 2023-02-01 ENCOUNTER — Ambulatory Visit: Payer: Medicare Other

## 2023-02-01 ENCOUNTER — Other Ambulatory Visit: Payer: Self-pay

## 2023-02-04 ENCOUNTER — Telehealth: Payer: Self-pay | Admitting: Student

## 2023-02-04 ENCOUNTER — Telehealth: Payer: Self-pay

## 2023-02-04 ENCOUNTER — Other Ambulatory Visit (HOSPITAL_COMMUNITY): Payer: Self-pay

## 2023-02-04 ENCOUNTER — Encounter: Payer: Self-pay | Admitting: Student

## 2023-02-04 ENCOUNTER — Ambulatory Visit: Payer: Medicare Other

## 2023-02-04 MED ORDER — ONDANSETRON 4 MG PO TBDP: 4.0000 mg | ORAL_TABLET | Freq: Three times a day (TID) | ORAL | 0 refills | Status: DC | PRN

## 2023-02-04 MED ORDER — MOUNJARO 12.5 MG/0.5ML ~~LOC~~ SOAJ: 12.5000 mg | SUBCUTANEOUS | 2 refills | Status: DC

## 2023-02-04 NOTE — Telephone Encounter (Signed)
 Pharmacy Patient Advocate Encounter   Received notification from CoverMyMeds that prior authorization for MOUNJARO  12.5MG  is required/requested.   Insurance verification completed.   The patient is insured through [facility] .  PA required; PA submitted to above mentioned insurance via CoverMyMeds Key/confirmation #/EOC [id number]. Status is pending

## 2023-02-04 NOTE — Telephone Encounter (Signed)
 Called patient and confirmed patient still taking Mounjaro  at 12.5 mg weekly. He has not taken in last 2 weeks. Has never had any nausea or symptoms with medication. Since he has been on this for a long time I think it is okay to send in mounjaro  medication. I did discuss risk of nausea/vomiting with medication and he was okay with trying this to keep his sugars down in preparation for his surgery in a few weeks. Will send in some zofran  PRN as well.

## 2023-02-04 NOTE — Telephone Encounter (Signed)
 Pharmacy Patient Advocate Encounter  Received notification from [employer] that Prior Authorization for [facility] has been APPROVED from 02/04/23 to 02/04/24   PA #/Case ID/Reference #: [id number]

## 2023-02-05 ENCOUNTER — Telehealth: Payer: Self-pay | Admitting: *Deleted

## 2023-02-05 NOTE — Telephone Encounter (Signed)
 Notified of message below

## 2023-02-05 NOTE — Telephone Encounter (Signed)
-----   Message from Melven Sartorius sent at 02/05/2023  4:30 PM EST ----- Would you let him know no signs of cancer spread. Good news. Thanks

## 2023-02-07 ENCOUNTER — Telehealth: Payer: Self-pay

## 2023-02-07 ENCOUNTER — Inpatient Hospital Stay: Payer: Medicare Other

## 2023-02-07 DIAGNOSIS — C678 Malignant neoplasm of overlapping sites of bladder: Secondary | ICD-10-CM

## 2023-02-07 LAB — CBC WITH DIFFERENTIAL (CANCER CENTER ONLY)
Abs Immature Granulocytes: 0.01 10*3/uL (ref 0.00–0.07)
Basophils Absolute: 0 10*3/uL (ref 0.0–0.1)
Basophils Relative: 1 %
Eosinophils Absolute: 0.2 10*3/uL (ref 0.0–0.5)
Eosinophils Relative: 3 %
HCT: 35.1 % — ABNORMAL LOW (ref 39.0–52.0)
Hemoglobin: 12 g/dL — ABNORMAL LOW (ref 13.0–17.0)
Immature Granulocytes: 0 %
Lymphocytes Relative: 22 %
Lymphs Abs: 1.3 10*3/uL (ref 0.7–4.0)
MCH: 30.7 pg (ref 26.0–34.0)
MCHC: 34.2 g/dL (ref 30.0–36.0)
MCV: 89.8 fL (ref 80.0–100.0)
Monocytes Absolute: 0.6 10*3/uL (ref 0.1–1.0)
Monocytes Relative: 10 %
Neutro Abs: 3.6 10*3/uL (ref 1.7–7.7)
Neutrophils Relative %: 64 %
Platelet Count: 256 10*3/uL (ref 150–400)
RBC: 3.91 MIL/uL — ABNORMAL LOW (ref 4.22–5.81)
RDW: 15.9 % — ABNORMAL HIGH (ref 11.5–15.5)
WBC Count: 5.6 10*3/uL (ref 4.0–10.5)
nRBC: 0 % (ref 0.0–0.2)

## 2023-02-07 LAB — CMP (CANCER CENTER ONLY)
ALT: 16 U/L (ref 0–44)
AST: 12 U/L — ABNORMAL LOW (ref 15–41)
Albumin: 4.1 g/dL (ref 3.5–5.0)
Alkaline Phosphatase: 66 U/L (ref 38–126)
Anion gap: 6 (ref 5–15)
BUN: 16 mg/dL (ref 6–20)
CO2: 29 mmol/L (ref 22–32)
Calcium: 9.4 mg/dL (ref 8.9–10.3)
Chloride: 103 mmol/L (ref 98–111)
Creatinine: 1.01 mg/dL (ref 0.61–1.24)
GFR, Estimated: >60 mL/min (ref >60)
Glucose, Bld: 246 mg/dL — ABNORMAL HIGH (ref 70–99)
Potassium: 3.9 mmol/L (ref 3.5–5.1)
Sodium: 138 mmol/L (ref 135–145)
Total Bilirubin: 0.5 mg/dL (ref 0.0–1.2)
Total Protein: 7 g/dL (ref 6.5–8.1)

## 2023-02-07 LAB — MAGNESIUM: Magnesium: 1.7 mg/dL (ref 1.7–2.4)

## 2023-02-07 MED ORDER — HEPARIN SOD (PORK) LOCK FLUSH 100 UNIT/ML IV SOLN
500.0000 [IU] | Freq: Once | INTRAVENOUS | Status: AC
  Administered 2023-02-07: 500 [IU]

## 2023-02-07 MED ORDER — SODIUM CHLORIDE 0.9% FLUSH
10.0000 mL | Freq: Once | INTRAVENOUS | Status: AC
  Administered 2023-02-07: 10 mL

## 2023-02-07 NOTE — Telephone Encounter (Signed)
 Per Dr. Nelta Numbers request, TC made to pt's dentist's office (Dr. Percell Boston) to inform that it is okay from an oncology standpoint for pt to have a tooth extraction. Spoke w/ receptionist, Gershon Cull, who states she will let Dr. Holly Bodily know.

## 2023-02-07 NOTE — Telephone Encounter (Signed)
 Called and spoke to Brad [name] and told him ok to get tooth pulled.  CBC is fine today.   Also talked about precautions in terms of infection, especially with Mediport.  He has no signs of local redness, tenderness, neck pain, headaches, fever or chills.  He has no signs of arm swelling to suggest DVT.  Advised to watch for signs.  Report to emergency room if needed.  His dental office is [phone] Dr. Madelin Florence.   Mandy please let dentist office know there is no issues from oncology standpoint regarding his dental procedure.  CBC is fine today.  Thank you.

## 2023-02-15 ENCOUNTER — Emergency Department (HOSPITAL_COMMUNITY): Payer: Medicare Other

## 2023-02-15 ENCOUNTER — Other Ambulatory Visit: Payer: Self-pay

## 2023-02-15 ENCOUNTER — Emergency Department (HOSPITAL_COMMUNITY)
Admission: EM | Admit: 2023-02-15 | Discharge: 2023-02-15 | Disposition: A | Discharge status: Home / Self Care | Payer: Medicare Other | Attending: Emergency Medicine | Admitting: Emergency Medicine

## 2023-02-15 ENCOUNTER — Encounter (HOSPITAL_COMMUNITY): Payer: Self-pay | Admitting: Emergency Medicine

## 2023-02-15 DIAGNOSIS — Z79899 Other long term (current) drug therapy: Secondary | ICD-10-CM | POA: Insufficient documentation

## 2023-02-15 DIAGNOSIS — M25512 Pain in left shoulder: Secondary | ICD-10-CM | POA: Insufficient documentation

## 2023-02-15 DIAGNOSIS — M25511 Pain in right shoulder: Secondary | ICD-10-CM | POA: Diagnosis present

## 2023-02-15 DIAGNOSIS — I10 Essential (primary) hypertension: Secondary | ICD-10-CM | POA: Insufficient documentation

## 2023-02-15 DIAGNOSIS — Z20822 Contact with and (suspected) exposure to covid-19: Secondary | ICD-10-CM | POA: Insufficient documentation

## 2023-02-15 DIAGNOSIS — Z8551 Personal history of malignant neoplasm of bladder: Secondary | ICD-10-CM | POA: Diagnosis not present

## 2023-02-15 LAB — CBC
HCT: 37 % — ABNORMAL LOW (ref 39.0–52.0)
Hemoglobin: 12.3 g/dL — ABNORMAL LOW (ref 13.0–17.0)
MCH: 30.6 pg (ref 26.0–34.0)
MCHC: 33.2 g/dL (ref 30.0–36.0)
MCV: 92 fL (ref 80.0–100.0)
Platelets: 207 10*3/uL (ref 150–400)
RBC: 4.02 MIL/uL — ABNORMAL LOW (ref 4.22–5.81)
RDW: 15.1 % (ref 11.5–15.5)
WBC: 5.6 10*3/uL (ref 4.0–10.5)
nRBC: 0 % (ref 0.0–0.2)

## 2023-02-15 LAB — BASIC METABOLIC PANEL
Anion gap: 9 (ref 5–15)
BUN: 12 mg/dL (ref 6–20)
CO2: 22 mmol/L (ref 22–32)
Calcium: 9.3 mg/dL (ref 8.9–10.3)
Chloride: 104 mmol/L (ref 98–111)
Creatinine, Ser: 1.03 mg/dL (ref 0.61–1.24)
GFR, Estimated: >60 mL/min (ref >60)
Glucose, Bld: 195 mg/dL — ABNORMAL HIGH (ref 70–99)
Potassium: 4.3 mmol/L (ref 3.5–5.1)
Sodium: 135 mmol/L (ref 135–145)

## 2023-02-15 LAB — RESP PANEL BY RT-PCR (RSV, FLU A&B, COVID)  RVPGX2
Influenza A by PCR: NEGATIVE
Influenza B by PCR: NEGATIVE
Resp Syncytial Virus by PCR: NEGATIVE
SARS Coronavirus 2 by RT PCR: NEGATIVE

## 2023-02-15 LAB — TROPONIN I (HIGH SENSITIVITY): Troponin I (High Sensitivity): 8 ng/L (ref <18)

## 2023-02-15 MED ORDER — TRAMADOL HCL 50 MG PO TABS
50.0000 mg | ORAL_TABLET | Freq: Once | ORAL | Status: AC
  Administered 2023-02-15: 50 mg via ORAL
  Filled 2023-02-15: qty 1

## 2023-02-15 MED ORDER — OXYCODONE-ACETAMINOPHEN 5-325 MG PO TABS: 1.0000 | ORAL_TABLET | Freq: Once | ORAL | Status: DC

## 2023-02-15 MED ORDER — TRAMADOL HCL 50 MG PO TABS: 50.0000 mg | ORAL_TABLET | Freq: Four times a day (QID) | ORAL | 0 refills | Status: DC | PRN

## 2023-02-15 NOTE — ED Triage Notes (Signed)
Pt via POV c/o a week of right shoulder pain radiating down front of shoulder, down flank, and into right buttock. He notes that it also began radiating to left shoulder today and does not feel like muscular pain. Pt has a port (bladder cancer). Pain currently rated 5/10. Denies CP, SOB, n/v/d.

## 2023-02-15 NOTE — ED Provider Notes (Signed)
[place] EMERGENCY DEPARTMENT AT [facility] Provider Note   CSN: [id number] Arrival date & time: 02/15/23  1139     History  Chief Complaint  Patient presents with   Shoulder Pain    Brad [name] is a 63 y.o. male history of hypertension, bladder cancer, here presenting with bilateral shoulder pain.  Patient states that he had his tooth extracted several days ago.  He states that since last night he has been having bilateral shoulder pain.  Denies any trauma or injury.  Denies any chest pain.  He denies any bladder pain.  States that he is taking ibuprofen right now with no relief. Denies any fever or chills  The history is provided by the patient.       Home Medications Prior to Admission medications   Medication Sig Start Date End Date Taking? Authorizing Provider  amLODipine (NORVASC) 10 MG tablet Take 1 tablet (10 mg total) by mouth daily. 12/15/20   Helane Rima, DO  Ascorbic Acid (VITAMIN C PO) Take 1 tablet by mouth daily.    [provider]  blood glucose meter kit and supplies KIT Dispense based on patient and insurance preference. Check fasting blood sugar once daily ICD10 R73.03 10/11/16   Salley Scarlet, MD  chlorthalidone (HYGROTON) 25 MG tablet Take 1 tablet (25 mg total) by mouth daily. 04/18/21   Helane Rima, DO  CRESTOR 5 MG tablet Take 5 mg by mouth 2 (two) times a week.    [provider]  glucose blood test strip Use as instructed to monitor FSBS 1x daily. Dx: R73.09 06/07/17   Salley Scarlet, MD  labetalol (NORMODYNE) 200 MG tablet TAKE 1 TABLET(200 MG) BY MOUTH TWICE DAILY 04/18/21   Helane Rima, DO  Lancets [facility] ULTRASOFT) lancets Use as instructed 10/11/16   Salley Scarlet, MD  losartan (COZAAR) 100 MG tablet Take 1 tablet (100 mg total) by mouth daily. 12/15/20   Helane Rima, DO  Magnesium Citrate (SLOWMAG MG MUSCLE HLTH/RECOVER PO) Take 2 tablets by mouth in the morning, at noon, and at bedtime. 01/04/23    Melven Sartorius, MD  MOUNJARO 12.5 MG/0.5ML Pen Inject 12.5 mg into the skin once a week. 02/04/23   Levin Erp, MD  Multiple Vitamins-Minerals (ONE-A-DAY MENS 50+ ADVANTAGE) TABS Take 1 tablet by mouth daily.    [provider]  ondansetron (ZOFRAN-ODT) 4 MG disintegrating tablet Take 1 tablet (4 mg total) by mouth every 8 (eight) hours as needed for nausea or vomiting. 02/04/23   Levin Erp, MD  oxybutynin (DITROPAN) 5 MG tablet Take 1 tablet (5 mg total) by mouth every 8 (eight) hours as needed for bladder spasms. 09/26/22   Rene Paci, MD  phenazopyridine (PYRIDIUM) 200 MG tablet Take 1 tablet (200 mg total) by mouth 3 (three) times daily as needed (for pain with urination). 09/26/22 09/26/23  Rene Paci, MD  POTASSIUM PO Take 1 tablet by mouth daily.    [provider]  tadalafil (CIALIS) 20 MG tablet TAKE 1 TABLET(20 MG) BY MOUTH DAILY AS NEEDED 03/01/21   Helane Rima, DO  Vitamin D, Ergocalciferol, (DRISDOL) 1.25 MG (50000 UNIT) CAPS capsule Take 1 capsule (50,000 Units total) by mouth every 7 (seven) days. 06/12/20   Katha Cabal, DO      Allergies    Patient has no known allergies.    Review of Systems   Review of Systems  Musculoskeletal:        Shoulder pain  All other systems reviewed and are negative.   Physical Exam Updated Vital Signs BP (!) 152/93 (BP Location: Right Arm)   Pulse 87   Temp 98.1 F (36.7 C)   Resp 18   Ht 6\' 9"  (2.057 m)   Wt (!) 139.7 kg   SpO2 100%   BMI 33.01 kg/m  Physical Exam Vitals and nursing note reviewed.  Constitutional:      Appearance: Normal appearance.  HENT:     Head: Normocephalic.     Nose: Nose normal.     Mouth/Throat:     Mouth: Mucous membranes are moist.  Eyes:     Extraocular Movements: Extraocular movements intact.     Pupils: Pupils are equal, round, and reactive to light.  Cardiovascular:     Rate and Rhythm: Normal rate and regular rhythm.     Pulses:  Normal pulses.     Heart sounds: Normal heart sounds.  Pulmonary:     Effort: Pulmonary effort is normal.     Breath sounds: Normal breath sounds.  Abdominal:     General: Abdomen is flat.     Palpations: Abdomen is soft.  Musculoskeletal:        General: Normal range of motion.     Cervical back: Normal range of motion and neck supple.     Comments: Normal range of motion bilateral shoulders.  No obvious scapula deformity  Skin:    General: Skin is warm.     Capillary Refill: Capillary refill takes less than 2 seconds.  Neurological:     General: No focal deficit present.     Mental Status: He is alert and oriented to person, place, and time.  Psychiatric:        Mood and Affect: Mood normal.        Behavior: Behavior normal.     ED Results / Procedures / Treatments   Labs (all labs ordered are listed, but only abnormal results are displayed) Labs Reviewed  CBC - Abnormal; Notable for the following components:      Result Value   RBC 4.02 (*)    Hemoglobin 12.3 (*)    HCT 37.0 (*)    All other components within normal limits  BASIC METABOLIC PANEL - Abnormal; Notable for the following components:   Glucose, Bld 195 (*)    All other components within normal limits  RESP PANEL BY RT-PCR (RSV, FLU A&B, COVID)  [id number]  TROPONIN I (HIGH SENSITIVITY)    EKG None  Radiology DG Chest 2 View Result Date: 02/15/2023 CLINICAL DATA:  Right-sided chest pain. EXAM: CHEST - 2 VIEW COMPARISON:  CT scan from 01/29/2023. FINDINGS: Bilateral lung fields are clear. Bilateral costophrenic angles are clear. Elevated right hemidiaphragm noted. Normal cardio-mediastinal silhouette. No acute osseous abnormalities. The soft tissues are within normal limits. Right-sided CT Port-A-Cath is seen with its tip overlying the upper portion of superior vena cava. IMPRESSION: No active cardiopulmonary disease. Electronically Signed   By: Jules Schick M.D.   On: 02/15/2023 14:15     Procedures Procedures    Medications Ordered in ED Medications  traMADol (ULTRAM) tablet 50 mg (has no administration in time range)    ED Course/ Medical Decision Making/ A&P                                 Medical Decision Making Brad [name] is a 63 y.o. male here presenting with shoulder  pain.  Patient does have a history of bladder cancer.  He recently had a CT chest that did not show any mets to the lungs.  Troponin negative and labs unremarkable.  Chest x-ray is clear.  Consider flu versus musculoskeletal pain.  Patient also just had a dental extraction and requesting some tramadol for pain.  Stable for discharge   Problems Addressed: Acute pain of both shoulders: acute illness or injury  Amount and/or Complexity of Data Reviewed Labs: ordered. Decision-making details documented in ED Course. Radiology: ordered and independent interpretation performed. Decision-making details documented in ED Course.  Risk Prescription drug management.    Final Clinical Impression(s) / ED Diagnoses Final diagnoses:  None    Rx / DC Orders ED Discharge Orders     None         Charlynne Pander, MD 02/15/23 1819

## 2023-02-15 NOTE — Discharge Instructions (Addendum)
Take tylenol and motrin for pain and tramadol for severe pain   Your COVID and flu and RSV test are negative.   See your doctor for follow up   Return to ER if you have worse shoulder pain, chest pain, abdominal pain

## 2023-02-20 ENCOUNTER — Other Ambulatory Visit: Payer: Self-pay | Admitting: Urology

## 2023-02-20 ENCOUNTER — Encounter (HOSPITAL_COMMUNITY): Payer: Self-pay

## 2023-02-20 NOTE — Progress Notes (Signed)
Patient had bodyaches and this is flu season

## 2023-03-06 ENCOUNTER — Other Ambulatory Visit: Payer: Self-pay

## 2023-03-06 DIAGNOSIS — C679 Malignant neoplasm of bladder, unspecified: Secondary | ICD-10-CM

## 2023-03-06 NOTE — Progress Notes (Signed)
 CBC, CMP and Mg ordered for 2/6

## 2023-03-07 ENCOUNTER — Inpatient Hospital Stay: Payer: Medicare Other

## 2023-03-07 DIAGNOSIS — Z8673 Personal history of transient ischemic attack (TIA), and cerebral infarction without residual deficits: Secondary | ICD-10-CM | POA: Insufficient documentation

## 2023-03-07 DIAGNOSIS — R310 Gross hematuria: Secondary | ICD-10-CM | POA: Diagnosis not present

## 2023-03-07 DIAGNOSIS — G4733 Obstructive sleep apnea (adult) (pediatric): Secondary | ICD-10-CM | POA: Insufficient documentation

## 2023-03-07 DIAGNOSIS — C678 Malignant neoplasm of overlapping sites of bladder: Secondary | ICD-10-CM | POA: Insufficient documentation

## 2023-03-07 DIAGNOSIS — I10 Essential (primary) hypertension: Secondary | ICD-10-CM | POA: Insufficient documentation

## 2023-03-07 DIAGNOSIS — Z79899 Other long term (current) drug therapy: Secondary | ICD-10-CM | POA: Insufficient documentation

## 2023-03-07 DIAGNOSIS — N400 Enlarged prostate without lower urinary tract symptoms: Secondary | ICD-10-CM | POA: Diagnosis not present

## 2023-03-07 DIAGNOSIS — Z8551 Personal history of malignant neoplasm of bladder: Secondary | ICD-10-CM | POA: Insufficient documentation

## 2023-03-07 DIAGNOSIS — E119 Type 2 diabetes mellitus without complications: Secondary | ICD-10-CM | POA: Diagnosis not present

## 2023-03-07 DIAGNOSIS — R079 Chest pain, unspecified: Secondary | ICD-10-CM | POA: Insufficient documentation

## 2023-03-07 DIAGNOSIS — I700 Atherosclerosis of aorta: Secondary | ICD-10-CM | POA: Insufficient documentation

## 2023-03-07 DIAGNOSIS — Z87891 Personal history of nicotine dependence: Secondary | ICD-10-CM | POA: Insufficient documentation

## 2023-03-07 DIAGNOSIS — C679 Malignant neoplasm of bladder, unspecified: Secondary | ICD-10-CM

## 2023-03-07 LAB — CBC WITH DIFFERENTIAL (CANCER CENTER ONLY)
Abs Immature Granulocytes: 0 10*3/uL (ref 0.00–0.07)
Basophils Absolute: 0 10*3/uL (ref 0.0–0.1)
Basophils Relative: 0 %
Eosinophils Absolute: 0.2 10*3/uL (ref 0.0–0.5)
Eosinophils Relative: 3 %
HCT: 37.4 % — ABNORMAL LOW (ref 39.0–52.0)
Hemoglobin: 12.5 g/dL — ABNORMAL LOW (ref 13.0–17.0)
Immature Granulocytes: 0 %
Lymphocytes Relative: 30 %
Lymphs Abs: 1.7 10*3/uL (ref 0.7–4.0)
MCH: 30.5 pg (ref 26.0–34.0)
MCHC: 33.4 g/dL (ref 30.0–36.0)
MCV: 91.2 fL (ref 80.0–100.0)
Monocytes Absolute: 0.5 10*3/uL (ref 0.1–1.0)
Monocytes Relative: 9 %
Neutro Abs: 3.3 10*3/uL (ref 1.7–7.7)
Neutrophils Relative %: 58 %
Platelet Count: 213 10*3/uL (ref 150–400)
RBC: 4.1 MIL/uL — ABNORMAL LOW (ref 4.22–5.81)
RDW: 13.1 % (ref 11.5–15.5)
WBC Count: 5.6 10*3/uL (ref 4.0–10.5)
nRBC: 0 % (ref 0.0–0.2)

## 2023-03-07 LAB — CMP (CANCER CENTER ONLY)
ALT: 19 U/L (ref 0–44)
AST: 17 U/L (ref 15–41)
Albumin: 4.2 g/dL (ref 3.5–5.0)
Alkaline Phosphatase: 58 U/L (ref 38–126)
Anion gap: 5 (ref 5–15)
BUN: 14 mg/dL (ref 6–20)
CO2: 28 mmol/L (ref 22–32)
Calcium: 9.4 mg/dL (ref 8.9–10.3)
Chloride: 103 mmol/L (ref 98–111)
Creatinine: 1.03 mg/dL (ref 0.61–1.24)
GFR, Estimated: >60 mL/min (ref >60)
Glucose, Bld: 195 mg/dL — ABNORMAL HIGH (ref 70–99)
Potassium: 4 mmol/L (ref 3.5–5.1)
Sodium: 136 mmol/L (ref 135–145)
Total Bilirubin: 0.6 mg/dL (ref 0.0–1.2)
Total Protein: 7.3 g/dL (ref 6.5–8.1)

## 2023-03-07 LAB — MAGNESIUM: Magnesium: 1.7 mg/dL (ref 1.7–2.4)

## 2023-03-07 NOTE — Assessment & Plan Note (Signed)
 Proceed with planned surgery Return in about end of March

## 2023-03-07 NOTE — Progress Notes (Signed)
 Patient Care Team: Christia Budds, MD as PCP - General (Family Medicine) [name], Aleene PARAS, [name], [facility] [name] Pauletta BROCKS, [name]  Clinic Day:  03/08/2023  Referring physician: Christia Budds, MD  ASSESSMENT & PLAN:   Assessment & Plan: Brad [name] is a 63 y.o.male with history of hypertension, CVA, type 2 diabetes, OSA and history of smoking quit 7 years ago prior to diagnosis being [facility] for muscle invasive bladder cancer.   He completed cycle 4 well.  He is recovering well without any significant side effect from chemotherapy.  We discussed rationale for surgery today.  Will proceed with surgery.  He will return to see me in about a month postoperatively to go over pathology and future plans.   Current diagnosis: T2 MIBC Urologist: Dr. Lonni Han Treatment: 11/22/22-01/07/23 completed 4 cycles of ddMVAC  Bladder cancer (HCC) Proceed with planned surgery Return in about end of March   The patient understands the plans discussed today and is in agreement with them.  He knows to contact our office if he develops concerns prior to his next appointment.  Pauletta BROCKS [name], MD  [facility] - A DEPT [facility] [address] [place] [address] Dept: [phone] Dept Fax: [phone]   Orders Placed This Encounter  Procedures   CBC with Differential (Cancer Center Only)    Standing Status:   Future    Expiration Date:   03/07/2024   CMP (Cancer Center only)    Standing Status:   Future    Expiration Date:   03/07/2024   Magnesium     Standing Status:   Future    Expiration Date:   03/07/2024      CHIEF COMPLAINT:  CC: MIBC  Current Treatment: Completed neoadjuvant therapy  INTERVAL HISTORY:  Brad [name] is here today for repeat clinical assessment. He denies fevers or chills. He denies chest  pain. His appetite is good.  Able to go to the gym. No trouble urinating, bloody urine, bloody stool or stomach pain, no coughing or short of breath. No numbness or tingling. No hearing loss.  He drinks a lot of water . Off Mg since last visit.  I have reviewed the past medical history, past surgical history, social history and family history with the patient and they are unchanged from previous note.  ALLERGIES:  has no known allergies.  MEDICATIONS:  Current Outpatient Medications  Medication Sig Dispense Refill   amLODipine  (NORVASC ) 10 MG tablet Take 1 tablet (10 mg total) by mouth daily. 90 tablet 3   Ascorbic Acid (VITAMIN C PO) Take 1 tablet by mouth daily.     blood glucose meter kit and supplies KIT Dispense based on patient and insurance preference. Check fasting blood sugar once daily ICD10 R73.03 1 each 0   chlorthalidone  (HYGROTON ) 25 MG tablet Take 1 tablet (25 mg total) by mouth daily. 30 tablet 2   CRESTOR 5 MG tablet Take 5 mg by mouth 2 (two) times a week.     glucose blood test strip Use as instructed to monitor FSBS 1x daily. Dx: R73.09 100 each 12   labetalol  (NORMODYNE ) 200 MG tablet TAKE 1 TABLET(200 MG) BY MOUTH TWICE DAILY 180 tablet 3   Lancets (ONETOUCH ULTRASOFT) lancets Use as instructed 100 each 12   losartan  (COZAAR ) 100 MG tablet Take 1 tablet (100 mg total) by  mouth daily. 90 tablet 3   Magnesium  Citrate (SLOWMAG MG MUSCLE HLTH/RECOVER PO) Take 2 tablets by mouth in the morning, at noon, and at bedtime.     MOUNJARO  12.5 MG/0.5ML Pen Inject 12.5 mg into the skin once a week. 2 mL 2   Multiple Vitamins-Minerals (ONE-A-DAY MENS 50+ ADVANTAGE) TABS Take 1 tablet by mouth daily.     ondansetron  (ZOFRAN -ODT) 4 MG disintegrating tablet Take 1 tablet (4 mg total) by mouth every 8 (eight) hours as needed for nausea or vomiting. 20 tablet 0   oxybutynin  (DITROPAN ) 5 MG tablet Take 1 tablet (5 mg total) by mouth every 8 (eight) hours as needed for bladder spasms. 30  tablet 1   phenazopyridine  (PYRIDIUM ) 200 MG tablet Take 1 tablet (200 mg total) by mouth 3 (three) times daily as needed (for pain with urination). 30 tablet 0   POTASSIUM PO Take 1 tablet by mouth daily.     tadalafil  (CIALIS ) 20 MG tablet TAKE 1 TABLET(20 MG) BY MOUTH DAILY AS NEEDED 30 tablet 3   traMADol  (ULTRAM ) 50 MG tablet Take 1 tablet (50 mg total) by mouth every 6 (six) hours as needed. 8 tablet 0   Vitamin D , Ergocalciferol , (DRISDOL ) 1.25 MG (50000 UNIT) CAPS capsule Take 1 capsule (50,000 Units total) by mouth every 7 (seven) days. 8 capsule 0   No current facility-administered medications for this visit.    HISTORY OF PRESENT ILLNESS:   Oncology History  Bladder cancer (HCC)  08/28/2022 Imaging   1. Contrast enhancing, lobulated endoluminal mass of the anterior bladder dome, measuring 2.6 x 2.0 x 1.2 cm, consistent with primary bladder malignancy. 2. Prominent varices about the bladder dome and anterior bladder, without overt evidence of extramural tumor extent. 3. No evidence of lymphadenopathy or metastatic disease in the abdomen or pelvis. 4. Prostatomegaly.   09/11/2022 Initial Diagnosis   Bladder cancer [facility]) At age 51 year old found to have a solid enhancing 2.5 cm anterior bladder dome mass on CT hematuria protocol on 08/28/2022 during evaluation for gross hematuria.    09/26/2022 Pathology Results   A. BLADDER TUMOR, SUPERFICIAL MARGIN DOME, TURBT:  High grade papillary urothelial carcinoma with inverted growth pattern  The carcinoma invades muscularis propria   B. BLADDER TUMOR, DEEP MARGIN DOME, TURBT:  Invasive high grade papillary urothelial carcinoma  Muscularis propria (detrusor muscle) is present and not involved by  carcinoma   C. BLADDER TUMOR, INFERIOR MARGIN, BIOPSY:  Submucosa and muscularis propria is present and not involved by  carcinoma    09/26/2022 Procedure   1.  Cystoscopy with TURBT (medium) 2.  Intravesical instillation of  gemcitabine  3.  Cystogram with intraoperative interpretation of fluoroscopic imaging   11/22/2022 - 01/07/2023 Chemotherapy   Patient is on Treatment Plan : BLADDER DOSE DENSE MVAC q14d     01/29/2023 Imaging   CT CAP: IMPRESSION: 1. Interval resection of the previously visualized bladder mass without evidence of local recurrence given limited distension. 2. No evidence of metastatic disease within the chest, abdomen or pelvis. 3.  Aortic Atherosclerosis (ICD10-I70.0).   03/07/2023 Cancer Staging   Staging form: Urinary Bladder, AJCC 8th Edition - Clinical: Stage II (cT2, cN0, cM0) - Signed by [name] Pauletta BROCKS, MD on 03/07/2023 Stage prefix: Initial diagnosis WHO/ISUP grade (low/high): High Grade Histologic grading system: 2 grade system       REVIEW OF SYSTEMS:   All relevant systems were reviewed with the patient and are negative.   VITALS:  Blood pressure 137/87, pulse  74, temperature 97.9 F (36.6 C), temperature source Temporal, resp. rate 18, weight 294 lb 1.6 oz (133.4 kg), SpO2 100%.  Wt Readings from Last 3 Encounters:  03/08/23 294 lb 1.6 oz (133.4 kg)  02/15/23 (!) 308 lb (139.7 kg)  01/31/23 (!) 308 lb 3.2 oz (139.8 kg)    Body mass index is 31.52 kg/m.  Performance status (ECOG): 0 - Asymptomatic  PHYSICAL EXAM:   GENERAL:alert, no distress and comfortable SKIN: skin color normal, no rashes  EYES: normal, sclera clear NECK: supple,  non-tender, without nodularity.  No erythema around the Mediport LYMPH:  no palpable cervical lymphadenopathy LUNGS: clear to auscultation with normal breathing effort.  No wheeze or rales HEART: regular rate & rhythm and no murmurs and no lower extremity edema ABDOMEN: abdomen soft, non-tender and nondistended Musculoskeletal: no edema  LABORATORY DATA:  I have reviewed the data as listed    Component Value Date/Time   NA 136 03/07/2023 1002   NA 142 08/27/2022 1535   K 4.0 03/07/2023 1002   CL 103 03/07/2023 1002    CO2 28 03/07/2023 1002   GLUCOSE 195 (H) 03/07/2023 1002   BUN 14 03/07/2023 1002   BUN 20 08/27/2022 1535   CREATININE 1.03 03/07/2023 1002   CREATININE 1.50 (H) 02/18/2018 1306   CALCIUM  9.4 03/07/2023 1002   PROT 7.3 03/07/2023 1002   PROT 7.2 08/27/2022 1535   ALBUMIN 4.2 03/07/2023 1002   ALBUMIN 4.5 08/27/2022 1535   AST 17 03/07/2023 1002   ALT 19 03/07/2023 1002   ALKPHOS 58 03/07/2023 1002   BILITOT 0.6 03/07/2023 1002   GFRNONAA >60 03/07/2023 1002   GFRAA 75 01/12/2020 1056    No results found for: SPEP, UPEP  Lab Results  Component Value Date   WBC 5.6 03/07/2023   NEUTROABS 3.3 03/07/2023   HGB 12.5 (L) 03/07/2023   HCT 37.4 (L) 03/07/2023   MCV 91.2 03/07/2023   PLT 213 03/07/2023      Chemistry      Component Value Date/Time   NA 136 03/07/2023 1002   NA 142 08/27/2022 1535   K 4.0 03/07/2023 1002   CL 103 03/07/2023 1002   CO2 28 03/07/2023 1002   BUN 14 03/07/2023 1002   BUN 20 08/27/2022 1535   CREATININE 1.03 03/07/2023 1002   CREATININE 1.50 (H) 02/18/2018 1306      Component Value Date/Time   CALCIUM  9.4 03/07/2023 1002   ALKPHOS 58 03/07/2023 1002   AST 17 03/07/2023 1002   ALT 19 03/07/2023 1002   BILITOT 0.6 03/07/2023 1002       RADIOGRAPHIC STUDIES: I have personally reviewed the radiological images as listed and agreed with the findings in the report. DG Chest 2 View Result Date: 02/15/2023 CLINICAL DATA:  Right-sided chest pain. EXAM: CHEST - 2 VIEW COMPARISON:  CT scan from 01/29/2023. FINDINGS: Bilateral lung fields are clear. Bilateral costophrenic angles are clear. Elevated right hemidiaphragm noted. Normal cardio-mediastinal silhouette. No acute osseous abnormalities. The soft tissues are within normal limits. Right-sided CT Port-A-Cath is seen with its tip overlying the upper portion of superior vena cava. IMPRESSION: No active cardiopulmonary disease. Electronically Signed   By: Ree Molt M.D.   On: 02/15/2023 14:15

## 2023-03-08 ENCOUNTER — Inpatient Hospital Stay: Payer: Medicare Other

## 2023-03-08 VITALS — BP 137/87 | HR 74 | Temp 97.9°F | Resp 18 | Wt 294.1 lb

## 2023-03-08 DIAGNOSIS — C679 Malignant neoplasm of bladder, unspecified: Secondary | ICD-10-CM | POA: Diagnosis not present

## 2023-03-08 DIAGNOSIS — C678 Malignant neoplasm of overlapping sites of bladder: Secondary | ICD-10-CM | POA: Diagnosis not present

## 2023-03-17 ENCOUNTER — Other Ambulatory Visit: Payer: Self-pay

## 2023-03-17 ENCOUNTER — Encounter (HOSPITAL_COMMUNITY): Payer: Self-pay

## 2023-03-17 ENCOUNTER — Emergency Department (HOSPITAL_COMMUNITY)
Admission: EM | Admit: 2023-03-17 | Discharge: 2023-03-17 | Disposition: A | Discharge status: Home / Self Care | Payer: Medicare Other | Attending: Emergency Medicine | Admitting: Emergency Medicine

## 2023-03-17 DIAGNOSIS — M542 Cervicalgia: Secondary | ICD-10-CM | POA: Insufficient documentation

## 2023-03-17 DIAGNOSIS — Z8551 Personal history of malignant neoplasm of bladder: Secondary | ICD-10-CM | POA: Insufficient documentation

## 2023-03-17 DIAGNOSIS — Z8673 Personal history of transient ischemic attack (TIA), and cerebral infarction without residual deficits: Secondary | ICD-10-CM | POA: Insufficient documentation

## 2023-03-17 DIAGNOSIS — Z87891 Personal history of nicotine dependence: Secondary | ICD-10-CM | POA: Diagnosis not present

## 2023-03-17 DIAGNOSIS — M25511 Pain in right shoulder: Secondary | ICD-10-CM | POA: Diagnosis not present

## 2023-03-17 DIAGNOSIS — M791 Myalgia, unspecified site: Secondary | ICD-10-CM | POA: Insufficient documentation

## 2023-03-17 DIAGNOSIS — E119 Type 2 diabetes mellitus without complications: Secondary | ICD-10-CM | POA: Diagnosis not present

## 2023-03-17 DIAGNOSIS — I10 Essential (primary) hypertension: Secondary | ICD-10-CM | POA: Diagnosis not present

## 2023-03-17 DIAGNOSIS — M545 Low back pain, unspecified: Secondary | ICD-10-CM | POA: Insufficient documentation

## 2023-03-17 HISTORY — DX: Malignant (primary) neoplasm, unspecified: C80.1

## 2023-03-17 MED ORDER — METHOCARBAMOL 500 MG PO TABS: 500.0000 mg | ORAL_TABLET | Freq: Two times a day (BID) | ORAL | 0 refills | Status: DC

## 2023-03-17 NOTE — ED Triage Notes (Signed)
Pt states he changed a tire on a car approx 2 wks ago and a few days after he had right shoulder pain, right neck pain, right rib pain and lower back pain. Pt denies falling or anything when changing tire.

## 2023-03-17 NOTE — ED Provider Notes (Signed)
[place] EMERGENCY DEPARTMENT AT [facility] Provider Note  Arrival date/time:03/17/2023 3:47 PM  HPI/ROS   Brad [name] is a 63 y.o. male with PMH significant for HTN, prior stroke, OSA, T2DM, NSTEMI who presents for back pain and shoulder pain.  History is provided by patient and wife.  Patient endorses that a few weeks ago, he was doing work on his car.  He started feeling pain on his right side and shoulder and neck shortly afterwards. His wife does note that he had previously been feeling pain in his neck and shoulders prior to working on his car, at which time he was seen in the emergency department and obtained x-ray imaging which was reassuring at that time.  This pain is somewhat different and has been coming and going for the past few weeks.  Patient's biggest concern is that he does have history of bladder cancer and wants to ensure that this pain is not metastasis of his bladder cancer.  Patient denies any numbness, tingling, weakness.  He denies any midline pain.  Denies any trauma or falls.    A complete ROS was performed with pertinent positives/negatives noted above.   ED Course and Medical Decision Making   I personally reviewed the patient's vitals.  Assessment/Plan: This is a well-appearing 63 year old patient who is presenting for right-sided neck, shoulder and back pain. On physical exam, he is well-appearing.  He has focal tenderness to palpation along the right trapezius, latissimus dorsi, and serratus anterior.  He has no bony tenderness palpation along prominences, including along the midline spine and so I have low suspicion for bony cause of pain or metastasis to bones. Patient with equal, strong motor in bilateral lower extremities and no fecal or urinary incontinence concerning for acute cord compression or cauda equina syndrome.  No trauma history or midline tenderness over spine concerning for vertebral fracture and no risk factors or systemic  symptoms concerning for occult cancer, metastases. Patient afebrile, denying IVDA, and is immunocompetent, and no overlying skin rash, making epidural abscess unlikely. Based on history and physical, I have a very low suspicion of a concerning etiology of pain including transverse myelitis, malignancy, abdominal aortic aneurysm, renal colic, acute lower extremity claudication, neurogenic claudication, ankylosing spondylitis, or other intra-abdominal process.   Due to absence of concerning risk factors in history and physical as well as absence of rapidly progressive, severe, or bilateral symptoms, will defer imaging at this point.  I do believe patient's pain is likely muscular in nature and will prescribe Robaxin and instructed to use RICE method at home for pain management.  Given the patient's reassuring presentation, I believe that they are safe for discharge.  I provided ED return precautions, specifically for the symptoms which are most concerning (e.g., saddle anesthesia, urinary or bowel incontinence or retention, changing or worsening pain), which would necessitate immediate return.  I encouraged the patient to followup with their PCP.  Disposition:  I discussed the plan for discharge with the patient and/or their surrogate at bedside prior to discharge and they were in agreement with the plan and verbalized understanding of the return precautions provided. All questions answered to the best of my ability. Ultimately, the patient was discharged in stable condition with stable vital signs. I am reassured that they are capable of close follow up and good social support at home.   Clinical Impression:  1. Muscular pain     Rx / DC Orders ED Discharge Orders  Ordered    methocarbamol (ROBAXIN) 500 MG tablet  2 times daily        03/17/23 1520            The plan for this patient was discussed with Dr. Renaye Rakers, who voiced agreement and who oversaw evaluation and treatment of  this patient.   Clinical Complexity A medically appropriate history, review of systems, and physical exam was performed.  If decision rules were used in this patient's evaluation, they are listed below.   Click here for ABCD2, HEART and other calculatorsREFRESH Note before signing   Patient's presentation is most consistent with acute, uncomplicated illness.  Medical Decision Making Risk Prescription drug management.    Physical Exam and Medical History   Vitals:   03/17/23 1422 03/17/23 1425  BP: (!) 158/102   Pulse: 80   Resp: 18   Temp: 98.1 F (36.7 C)   SpO2: 100%   Weight:  133.4 kg  Height:  6\' 9"  (2.057 m)    Physical Exam Vitals and nursing note reviewed.  Constitutional:      General: He is not in acute distress.    Appearance: He is well-developed.  HENT:     Head: Normocephalic and atraumatic.  Eyes:     Conjunctiva/sclera: Conjunctivae normal.  Cardiovascular:     Rate and Rhythm: Normal rate.  Abdominal:     Palpations: Abdomen is soft.  Musculoskeletal:        General: No swelling.     Cervical back: Neck supple.     Comments: TTP along right trapezius, right latissimus dorsi, and serratus anterior. No midline spinal TTP   Skin:    General: Skin is warm and dry.     Capillary Refill: Capillary refill takes less than 2 seconds.  Neurological:     General: No focal deficit present.     Mental Status: He is alert and oriented to person, place, and time. Mental status is at baseline.  Psychiatric:        Mood and Affect: Mood normal.     Medical History: No Known Allergies Past Medical History:  Diagnosis Date   Abnormal ultrasound of carotid artery 04/2014   no significant obstruction   Ataxia 04/2014   Cancer (HCC)    Diabetes (HCC)    EKG abnormalities 04/2014   poor R wave progression, NSR   Former smoker    25 years x 1.5 ppd, stopped 04/2014   Gait disturbance 04/2014   s/p stroke   H/O echocardiogram 04/2014   moderate LVH,  60-65% EF, no valve disease   History of heart attack    Hypertension    Hypertensive heart disease 04/2014   Intraparenchymal hemorrhage of brain [facility]) 05/05/2014   hospitalization [facility]   Myocardial infarction [facility])    Obesity    Prediabetes    Short-term memory loss 04/2014   Sleep apnea    Stroke (HCC) 04/2014    Past Surgical History:  Procedure Laterality Date   APPENDECTOMY     CHOLECYSTECTOMY     CIRCUMCISION     CYSTOSCOPY N/A 09/26/2022   Procedure: CYSTOSCOPY;  Surgeon: Rene Paci, MD;  Location: [facility];  Service: Urology;  Laterality: N/A;  60 MINUTES   HEMORRHOID SURGERY     IR IMAGING GUIDED PORT INSERTION  11/08/2022   Family History  Problem Relation Age of Onset   Heart disease Mother    Hypertension Mother    Sudden death Mother  Hypertension Brother    Aneurysm Paternal Grandmother     Social History   Tobacco Use   Smoking status: Former    Current packs/day: 0.00    Average packs/day: 1.5 packs/day for 25.0 years (37.5 ttl pk-yrs)    Types: Cigarettes    Start date: 05/04/1989    Quit date: 05/05/2014    Years since quitting: 8.8    Passive exposure: Past   Smokeless tobacco: Never  Vaping Use   Vaping status: Never Used  Substance Use Topics   Alcohol use: No    Alcohol/week: 0.0 standard drinks of alcohol   Drug use: No    Procedures   If procedures were preformed on this patient, they are listed below:  Procedures   -------- HPI and MDM generated using voice dictation software and may contain dictation errors. Please contact me for any clarification or with any questions.   Cephus Slater, MD Emergency Medicine PGY-2    Caron Presume, MD 03/17/23 1547    Terald Sleeper, MD 03/18/23 1054

## 2023-03-17 NOTE — Discharge Instructions (Addendum)
Brad [name]:  Thank you for allowing [name] to take care of you today.  We hope you begin feeling better soon.  To-Do: Please follow up with your oncologist. We sent a prescription to your preferred pharmacist. Please return to the Emergency Department or call 911 if you experience chest pain, shortness of breath, severe pain, severe fever, altered mental status, or have any reason to think that you need emergency medical care.  Thank you again.  Hope you feel better soon.  Department of Emergency Medicine [facility]

## 2023-03-21 ENCOUNTER — Telehealth: Payer: Self-pay

## 2023-03-21 MED ORDER — GABAPENTIN 100 MG PO CAPS
ORAL_CAPSULE | ORAL | 0 refills | Status: DC
Start: 2023-03-21 — End: 2023-07-05

## 2023-03-21 NOTE — Telephone Encounter (Addendum)
TC from pt, reporting numbness and tingling to bilateral feet x several days. Consulted Dr. Cherly Hensen, and new order for gabapentin received as submitted to [facility]. Pt advised.

## 2023-03-22 ENCOUNTER — Ambulatory Visit (HOSPITAL_COMMUNITY): Payer: Medicare Other | Admitting: Nurse Practitioner

## 2023-03-22 ENCOUNTER — Ambulatory Visit (HOSPITAL_COMMUNITY)
Admit: 2023-03-22 | Discharge: 2023-03-22 | Disposition: A | Discharge status: Home / Self Care | Payer: Medicare Other | Attending: Plastic Surgery | Admitting: Plastic Surgery

## 2023-03-22 DIAGNOSIS — C679 Malignant neoplasm of bladder, unspecified: Secondary | ICD-10-CM | POA: Diagnosis present

## 2023-03-22 DIAGNOSIS — Z7189 Other specified counseling: Secondary | ICD-10-CM | POA: Insufficient documentation

## 2023-03-22 NOTE — Progress Notes (Signed)
 [facility]   Reason for visit:   Preoperative stoma site marking and counseling regarding ileal conduit surgery  Discussed surgical procedure and stoma creation with patient and family.  Explained role of the [facility] nurse team.  Provided the patient with educational booklet and provided samples of pouching options.  Answered patient and family questions.   Examined patient lying, sitting, and standing in order to place the marking in the patient's visual field, away from any creases or abdominal contour issues and within the rectus muscle.  Patient has long torso and rounded abdomen He is not able to see a lower marking.    Marked for ileal conduit in the RUQ 5 cm to the right of the umbilicus and  9 cm above the umbilicus. Patient observes several location options and feels this high marking gives him best visualization and is not in an abdominal fold or crease  Patient's abdomen cleansed with CHG wipes at site markings, allowed to air dry prior to marking.Covered mark with thin film transparent dressing to preserve mark until date of surgery.   Patient is retired now, but very Recruitment consultant  We discuss life with an ostomy, pouching options  1 piece vs 2 piece  bedside drainage use  HPI:  Bladder cancer Past Medical History:  Diagnosis Date   Abnormal ultrasound of carotid artery 04/2014   no significant obstruction   Ataxia 04/2014   Cancer (HCC)    Diabetes (HCC)    EKG abnormalities 04/2014   poor R wave progression, NSR   Former smoker    25 years x 1.5 ppd, stopped 04/2014   Gait disturbance 04/2014   s/p stroke   H/O echocardiogram 04/2014   moderate LVH, 60-65% EF, no valve disease   History of heart attack    Hypertension    Hypertensive heart disease 04/2014   Intraparenchymal hemorrhage of brain (HCC) 05/05/2014   hospitalization [facility]   Myocardial infarction [facility])    Obesity    Prediabetes    Short-term memory loss 04/2014   Sleep apnea     Stroke (HCC) 04/2014   Family History  Problem Relation Age of Onset   Heart disease Mother    Hypertension Mother    Sudden death Mother    Hypertension Brother    Aneurysm Paternal Grandmother    No Known Allergies Current Outpatient Medications  Medication Sig Dispense Refill Last Dose/Taking   acetaminophen (TYLENOL) 500 MG tablet Take 500 mg by mouth every 6 (six) hours as needed for moderate pain (pain score 4-6).      amLODipine (NORVASC) 10 MG tablet Take 1 tablet (10 mg total) by mouth daily. 90 tablet 3    Aspirin-Caffeine (BC FAST PAIN RELIEF PO) Take 1 packet by mouth daily as needed (pain).      blood glucose meter kit and supplies KIT Dispense based on patient and insurance preference. Check fasting blood sugar once daily ICD10 R73.03 1 each 0    chlorthalidone (HYGROTON) 25 MG tablet Take 1 tablet (25 mg total) by mouth daily. (Patient taking differently: Take 25 mg by mouth daily as needed (fluid retention).) 30 tablet 2    Cholecalciferol (VITAMIN D3 MAXIMUM STRENGTH) 125 MCG (5000 UT) capsule Take 5,000 Units by mouth daily.      gabapentin (NEURONTIN) 100 MG capsule Take 1 capsule (100 mg total) by mouth at bedtime for 3 days, THEN 2 capsules (200 mg total) at bedtime. (Patient taking differently: Take 100 mg at  bedtime) 63 capsule 0    glucose blood test strip Use as instructed to monitor FSBS 1x daily. Dx: R73.09 100 each 12    ibuprofen (ADVIL) 800 MG tablet Take 400 mg by mouth every 6 (six) hours as needed.      labetalol (NORMODYNE) 200 MG tablet TAKE 1 TABLET(200 MG) BY MOUTH TWICE DAILY 180 tablet 3    Lancets (ONETOUCH ULTRASOFT) lancets Use as instructed 100 each 12    losartan (COZAAR) 100 MG tablet Take 1 tablet (100 mg total) by mouth daily. 90 tablet 3    methocarbamol (ROBAXIN) 500 MG tablet Take 1 tablet (500 mg total) by mouth 2 (two) times daily. (Patient taking differently: Take 500 mg by mouth 2 (two) times daily as needed for muscle spasms.) 20  tablet 0    MOUNJARO 12.5 MG/0.5ML Pen Inject 12.5 mg into the skin once a week. 2 mL 2    Multiple Vitamins-Minerals (ONE-A-DAY MENS 50+ ADVANTAGE) TABS Take 1 tablet by mouth daily.      tadalafil (CIALIS) 20 MG tablet TAKE 1 TABLET(20 MG) BY MOUTH DAILY AS NEEDED 30 tablet 3    No current facility-administered medications for this encounter.   ROS  Review of Systems  Cardiovascular:        History MI  Genitourinary:        Bladder cancer with upcoming cystoproctatectomy  Skin: Negative.   Psychiatric/Behavioral:  The patient is nervous/anxious.   All other systems reviewed and are negative.  Vital signs:  There were no vitals taken for this visit. Exam:  Physical Exam Constitutional:      Appearance: He is obese.  Abdominal:     Comments: Obese rounded abdomen  Musculoskeletal:        General: Normal range of motion.  Skin:    General: Skin is warm and dry.  Neurological:     Mental Status: He is alert and oriented to person, place, and time. Mental status is at baseline.       Education provided:  LIfe with an ostomy, pouching options    Impression/dx  Bladder cancer with upcoming cystoproctatectomy Discussion  See above Plan  See back post operatively to assess stoma, pouching and any related concerns    Visit time: 55 minutes.   Mike Gip FNP-BC

## 2023-03-25 DIAGNOSIS — Z7189 Other specified counseling: Secondary | ICD-10-CM | POA: Insufficient documentation

## 2023-03-25 NOTE — Discharge Instructions (Signed)
 Good luck with surgery Follow up in clinic 1-2 weeks after surgery [phone]

## 2023-03-28 ENCOUNTER — Inpatient Hospital Stay (HOSPITAL_COMMUNITY)
Admission: RE | Admit: 2023-03-28 | Discharge: 2023-04-03 | DRG: 654 | Disposition: A | Discharge status: Home Health | Payer: Medicare Other | Attending: Urology | Admitting: Urology

## 2023-03-28 DIAGNOSIS — I69293 Ataxia following other nontraumatic intracranial hemorrhage: Secondary | ICD-10-CM | POA: Diagnosis not present

## 2023-03-28 DIAGNOSIS — I69393 Ataxia following cerebral infarction: Secondary | ICD-10-CM

## 2023-03-28 DIAGNOSIS — E119 Type 2 diabetes mellitus without complications: Secondary | ICD-10-CM | POA: Diagnosis present

## 2023-03-28 DIAGNOSIS — Z9049 Acquired absence of other specified parts of digestive tract: Secondary | ICD-10-CM | POA: Diagnosis not present

## 2023-03-28 DIAGNOSIS — R2689 Other abnormalities of gait and mobility: Secondary | ICD-10-CM | POA: Diagnosis present

## 2023-03-28 DIAGNOSIS — I252 Old myocardial infarction: Secondary | ICD-10-CM | POA: Diagnosis not present

## 2023-03-28 DIAGNOSIS — E669 Obesity, unspecified: Secondary | ICD-10-CM | POA: Diagnosis present

## 2023-03-28 DIAGNOSIS — Z9889 Other specified postprocedural states: Secondary | ICD-10-CM

## 2023-03-28 DIAGNOSIS — Z7982 Long term (current) use of aspirin: Secondary | ICD-10-CM

## 2023-03-28 DIAGNOSIS — I119 Hypertensive heart disease without heart failure: Secondary | ICD-10-CM | POA: Diagnosis present

## 2023-03-28 DIAGNOSIS — Z8719 Personal history of other diseases of the digestive system: Secondary | ICD-10-CM

## 2023-03-28 DIAGNOSIS — I69211 Memory deficit following other nontraumatic intracranial hemorrhage: Secondary | ICD-10-CM

## 2023-03-28 DIAGNOSIS — Z79899 Other long term (current) drug therapy: Secondary | ICD-10-CM | POA: Diagnosis not present

## 2023-03-28 DIAGNOSIS — G4733 Obstructive sleep apnea (adult) (pediatric): Secondary | ICD-10-CM | POA: Diagnosis not present

## 2023-03-28 DIAGNOSIS — Z95828 Presence of other vascular implants and grafts: Secondary | ICD-10-CM

## 2023-03-28 DIAGNOSIS — I69298 Other sequelae of other nontraumatic intracranial hemorrhage: Secondary | ICD-10-CM

## 2023-03-28 DIAGNOSIS — Z7985 Long-term (current) use of injectable non-insulin antidiabetic drugs: Secondary | ICD-10-CM | POA: Diagnosis not present

## 2023-03-28 DIAGNOSIS — K567 Ileus, unspecified: Secondary | ICD-10-CM | POA: Diagnosis not present

## 2023-03-28 DIAGNOSIS — Z87891 Personal history of nicotine dependence: Secondary | ICD-10-CM | POA: Diagnosis not present

## 2023-03-28 DIAGNOSIS — C679 Malignant neoplasm of bladder, unspecified: Principal | ICD-10-CM | POA: Diagnosis present

## 2023-03-28 DIAGNOSIS — I69311 Memory deficit following cerebral infarction: Secondary | ICD-10-CM | POA: Diagnosis not present

## 2023-03-28 DIAGNOSIS — Z8249 Family history of ischemic heart disease and other diseases of the circulatory system: Secondary | ICD-10-CM | POA: Diagnosis not present

## 2023-03-28 DIAGNOSIS — I69398 Other sequelae of cerebral infarction: Secondary | ICD-10-CM

## 2023-03-28 DIAGNOSIS — G4730 Sleep apnea, unspecified: Secondary | ICD-10-CM | POA: Diagnosis present

## 2023-03-28 DIAGNOSIS — I2510 Atherosclerotic heart disease of native coronary artery without angina pectoris: Secondary | ICD-10-CM | POA: Diagnosis present

## 2023-03-28 DIAGNOSIS — I10 Essential (primary) hypertension: Secondary | ICD-10-CM | POA: Diagnosis not present

## 2023-03-28 LAB — TYPE AND SCREEN
ABO/RH(D): O POS
Antibody Screen: NEGATIVE

## 2023-03-28 LAB — COMPREHENSIVE METABOLIC PANEL
ALT: 28 U/L (ref 0–44)
AST: 24 U/L (ref 15–41)
Albumin: 3.9 g/dL (ref 3.5–5.0)
Alkaline Phosphatase: 54 U/L (ref 38–126)
Anion gap: 8 (ref 5–15)
BUN: 16 mg/dL (ref 6–20)
CO2: 24 mmol/L (ref 22–32)
Calcium: 9.1 mg/dL (ref 8.9–10.3)
Chloride: 105 mmol/L (ref 98–111)
Creatinine, Ser: 1.03 mg/dL (ref 0.61–1.24)
GFR, Estimated: >60 mL/min (ref >60)
Glucose, Bld: 134 mg/dL — ABNORMAL HIGH (ref 70–99)
Potassium: 3.5 mmol/L (ref 3.5–5.1)
Sodium: 137 mmol/L (ref 135–145)
Total Bilirubin: 0.5 mg/dL (ref 0.0–1.2)
Total Protein: 7 g/dL (ref 6.5–8.1)

## 2023-03-28 LAB — CBC
HCT: 36.4 % — ABNORMAL LOW (ref 39.0–52.0)
Hemoglobin: 12.1 g/dL — ABNORMAL LOW (ref 13.0–17.0)
MCH: 30.5 pg (ref 26.0–34.0)
MCHC: 33.2 g/dL (ref 30.0–36.0)
MCV: 91.7 fL (ref 80.0–100.0)
Platelets: 220 10*3/uL (ref 150–400)
RBC: 3.97 MIL/uL — ABNORMAL LOW (ref 4.22–5.81)
RDW: 12 % (ref 11.5–15.5)
WBC: 7.1 10*3/uL (ref 4.0–10.5)
nRBC: 0 % (ref 0.0–0.2)

## 2023-03-28 LAB — SURGICAL PCR SCREEN
MRSA, PCR: NEGATIVE
Staphylococcus aureus: NEGATIVE

## 2023-03-28 MED ORDER — AMLODIPINE BESYLATE 10 MG PO TABS
10.0000 mg | ORAL_TABLET | Freq: Every day | ORAL | Status: DC
  Administered 2023-03-30 – 2023-04-03 (×5): 10 mg via ORAL
  Filled 2023-03-28 (×5): qty 1

## 2023-03-28 MED ORDER — PEG 3350-KCL-NA BICARB-NACL 420 G PO SOLR
4000.0000 mL | Freq: Once | ORAL | Status: AC
  Administered 2023-03-28: 4000 mL via ORAL

## 2023-03-28 MED ORDER — METRONIDAZOLE 500 MG PO TABS
500.0000 mg | ORAL_TABLET | ORAL | Status: AC
  Administered 2023-03-28 – 2023-03-29 (×2): 500 mg via ORAL
  Filled 2023-03-28 (×2): qty 1

## 2023-03-28 MED ORDER — GABAPENTIN 100 MG PO CAPS
100.0000 mg | ORAL_CAPSULE | Freq: Every day | ORAL | Status: DC
  Administered 2023-03-28 – 2023-04-02 (×6): 100 mg via ORAL
  Filled 2023-03-28 (×6): qty 1

## 2023-03-28 MED ORDER — NEOMYCIN SULFATE 500 MG PO TABS
500.0000 mg | ORAL_TABLET | ORAL | Status: AC
  Administered 2023-03-28 – 2023-03-29 (×2): 500 mg via ORAL
  Filled 2023-03-28 (×2): qty 1

## 2023-03-28 MED ORDER — ALVIMOPAN 12 MG PO CAPS
12.0000 mg | ORAL_CAPSULE | ORAL | Status: AC
  Administered 2023-03-29: 12 mg via ORAL
  Filled 2023-03-28: qty 1

## 2023-03-28 MED ORDER — SODIUM CHLORIDE 0.9 % IV SOLN: INTRAVENOUS | Status: DC

## 2023-03-28 MED ORDER — PIPERACILLIN-TAZOBACTAM 3.375 G IVPB 30 MIN
3.3750 g | Freq: Once | INTRAVENOUS | Status: AC
Start: 2023-03-29 — End: 2023-03-29
  Administered 2023-03-29: 3.375 g via INTRAVENOUS
  Filled 2023-03-28: qty 50

## 2023-03-28 NOTE — Anesthesia Preprocedure Evaluation (Signed)
 Anesthesia Evaluation  Patient identified by MRN, date of birth, ID band Patient awake    Reviewed: Allergy & Precautions, NPO status , Patient's Chart, lab work & pertinent test results  Airway Mallampati: III  TM Distance: >3 FB Neck ROM: Full    Dental  (+) Teeth Intact, Dental Advisory Given   Pulmonary sleep apnea , former smoker   Pulmonary exam normal breath sounds clear to auscultation       Cardiovascular hypertension, Pt. on medications and Pt. on home beta blockers + CAD, + Past MI and + Peripheral Vascular Disease  Normal cardiovascular exam Rhythm:Regular Rate:Normal  Echo 10/24:  1. Left ventricular ejection fraction, by estimation, is 60 to 65%. The  left ventricle has normal function. The left ventricle has no regional  wall motion abnormalities. There is mild concentric left ventricular  hypertrophy. Left ventricular diastolic  parameters are consistent with Grade I diastolic dysfunction (impaired  relaxation). Elevated left atrial pressure.   2. Right ventricular systolic function is normal. The right ventricular  size is normal. Tricuspid regurgitation signal is inadequate for assessing  PA pressure.   3. Left atrial size was mildly dilated.   4. Right atrial size was mildly dilated.   5. The mitral valve is normal in structure. Trivial mitral valve  regurgitation. No evidence of mitral stenosis.   6. The aortic valve is tricuspid. Aortic valve regurgitation is not  visualized. No aortic stenosis is present.   7. The aorta appears dilated even when making allowance for body size.  Aortic dilatation noted. There is mild dilatation of the ascending aorta,  measuring 44 mm.   8. The inferior vena cava is normal in size with greater than 50%  respiratory variability, suggesting right atrial pressure of 3 mmHg.     Neuro/Psych CVA    GI/Hepatic negative GI ROS, Neg liver ROS,,,  Endo/Other  diabetes   Obesity   Renal/GU negative Renal ROS   BLADDER CANCER    Musculoskeletal negative musculoskeletal ROS (+)    Abdominal   Peds  Hematology  (+) Blood dyscrasia, anemia   Anesthesia Other Findings   Reproductive/Obstetrics                             Anesthesia Physical Anesthesia Plan  ASA: 3  Anesthesia Plan: General   Post-op Pain Management: Tylenol PO (pre-op)* and Ketamine IV*   Induction: Intravenous  PONV Risk Score and Plan: 3 and Midazolam, Dexamethasone and Ondansetron  Airway Management Planned: Oral ETT  Additional Equipment: Arterial line  Intra-op Plan:   Post-operative Plan: Extubation in OR  Informed Consent: I have reviewed the patients History and Physical, chart, labs and discussed the procedure including the risks, benefits and alternatives for the proposed anesthesia with the patient or authorized representative who has indicated his/her understanding and acceptance.     Dental advisory given  Plan Discussed with: CRNA  Anesthesia Plan Comments: (2 large bore PIV)        Anesthesia Quick Evaluation

## 2023-03-29 ENCOUNTER — Inpatient Hospital Stay (HOSPITAL_COMMUNITY): Payer: Self-pay | Admitting: Certified Registered"

## 2023-03-29 ENCOUNTER — Other Ambulatory Visit: Payer: Self-pay

## 2023-03-29 ENCOUNTER — Encounter (HOSPITAL_COMMUNITY): Payer: Self-pay | Admitting: Urology

## 2023-03-29 ENCOUNTER — Encounter (HOSPITAL_COMMUNITY)
Admission: RE | Disposition: A | Discharge status: Home Health | Payer: Self-pay | Source: Home / Self Care | Attending: Urology

## 2023-03-29 DIAGNOSIS — I2510 Atherosclerotic heart disease of native coronary artery without angina pectoris: Secondary | ICD-10-CM

## 2023-03-29 DIAGNOSIS — I10 Essential (primary) hypertension: Secondary | ICD-10-CM

## 2023-03-29 DIAGNOSIS — C679 Malignant neoplasm of bladder, unspecified: Secondary | ICD-10-CM

## 2023-03-29 DIAGNOSIS — G4733 Obstructive sleep apnea (adult) (pediatric): Secondary | ICD-10-CM

## 2023-03-29 HISTORY — PX: ROBOT LAP RADICAL CYSTOPROSTATECTOMY PELVIC LYMPHADENECTOMY, NEOBLADDER: SHX7395

## 2023-03-29 LAB — GLUCOSE, CAPILLARY
Glucose-Capillary: 129 mg/dL — ABNORMAL HIGH (ref 70–99)
Glucose-Capillary: 154 mg/dL — ABNORMAL HIGH (ref 70–99)
Glucose-Capillary: 205 mg/dL — ABNORMAL HIGH (ref 70–99)
Glucose-Capillary: 142 mg/dL — ABNORMAL HIGH (ref 70–99)

## 2023-03-29 LAB — HEMOGLOBIN AND HEMATOCRIT, BLOOD
HCT: 34.9 % — ABNORMAL LOW (ref 39.0–52.0)
Hemoglobin: 11.6 g/dL — ABNORMAL LOW (ref 13.0–17.0)

## 2023-03-29 LAB — HEMOGLOBIN A1C
Hgb A1c MFr Bld: 6.6 % — ABNORMAL HIGH (ref 4.8–5.6)
Mean Plasma Glucose: 142.72 mg/dL

## 2023-03-29 SURGERY — ROBOT ASSISTED LAPAROSCOPIC RADICAL CYSTOPROSTATECTOMY BILATERAL PELVIC LYMPHADENECTOMY,ORTHOTOPIC NEOBLADDER
Anesthesia: General | Site: Pelvis

## 2023-03-29 MED ORDER — GLYCOPYRROLATE PF 0.2 MG/ML IJ SOSY: PREFILLED_SYRINGE | INTRAMUSCULAR | Status: DC | PRN

## 2023-03-29 MED ORDER — ONDANSETRON HCL 4 MG/2ML IJ SOLN: 4.0000 mg | Freq: Once | INTRAMUSCULAR | Status: DC | PRN

## 2023-03-29 MED ORDER — ALVIMOPAN 12 MG PO CAPS
12.0000 mg | ORAL_CAPSULE | Freq: Two times a day (BID) | ORAL | Status: DC
  Administered 2023-03-30 – 2023-04-01 (×5): 12 mg via ORAL
  Filled 2023-03-29 (×5): qty 1

## 2023-03-29 MED ORDER — SODIUM CHLORIDE (PF) 0.9 % IJ SOLN
INTRAMUSCULAR | Status: AC
  Filled 2023-03-29: qty 20

## 2023-03-29 MED ORDER — STERILE WATER FOR INJECTION IJ SOLN
INTRAMUSCULAR | Status: DC | PRN
  Administered 2023-03-29: 20 mL

## 2023-03-29 MED ORDER — BUPIVACAINE LIPOSOME 1.3 % IJ SUSP
INTRAMUSCULAR | Status: DC | PRN
Start: 2023-03-29 — End: 2023-03-29
  Administered 2023-03-29: 20 mL

## 2023-03-29 MED ORDER — PHENYLEPHRINE HCL (PRESSORS) 10 MG/ML IV SOLN
INTRAVENOUS | Status: AC
  Filled 2023-03-29: qty 1

## 2023-03-29 MED ORDER — ONDANSETRON HCL 4 MG/2ML IJ SOLN
INTRAMUSCULAR | Status: AC
  Filled 2023-03-29: qty 2

## 2023-03-29 MED ORDER — ONDANSETRON HCL 4 MG/2ML IJ SOLN: 4.0000 mg | INTRAMUSCULAR | Status: DC | PRN

## 2023-03-29 MED ORDER — DIPHENHYDRAMINE HCL 50 MG/ML IJ SOLN: 12.5000 mg | Freq: Four times a day (QID) | INTRAMUSCULAR | Status: DC | PRN

## 2023-03-29 MED ORDER — DEXMEDETOMIDINE HCL IN NACL 80 MCG/20ML IV SOLN
INTRAVENOUS | Status: DC | PRN
  Administered 2023-03-29: 12 ug via INTRAVENOUS

## 2023-03-29 MED ORDER — LACTATED RINGERS IV SOLN: INTRAVENOUS | Status: DC | PRN

## 2023-03-29 MED ORDER — ACETAMINOPHEN 500 MG PO TABS
1000.0000 mg | ORAL_TABLET | Freq: Once | ORAL | Status: AC
  Administered 2023-03-29: 1000 mg via ORAL
  Filled 2023-03-29: qty 2

## 2023-03-29 MED ORDER — SENNOSIDES-DOCUSATE SODIUM 8.6-50 MG PO TABS
2.0000 | ORAL_TABLET | Freq: Every day | ORAL | Status: DC
  Administered 2023-03-29 – 2023-04-01 (×4): 2 via ORAL
  Filled 2023-03-29 (×4): qty 2

## 2023-03-29 MED ORDER — FENTANYL CITRATE (PF) 100 MCG/2ML IJ SOLN
INTRAMUSCULAR | Status: AC
  Filled 2023-03-29: qty 2

## 2023-03-29 MED ORDER — PIPERACILLIN-TAZOBACTAM 3.375 G IVPB
3.3750 g | Freq: Three times a day (TID) | INTRAVENOUS | Status: AC
  Administered 2023-03-29 – 2023-03-30 (×3): 3.375 g via INTRAVENOUS
  Filled 2023-03-29 (×3): qty 50

## 2023-03-29 MED ORDER — SODIUM CHLORIDE 0.9 % IV SOLN: INTRAVENOUS | Status: AC

## 2023-03-29 MED ORDER — ROCURONIUM BROMIDE 10 MG/ML (PF) SYRINGE
PREFILLED_SYRINGE | INTRAVENOUS | Status: DC | PRN
  Administered 2023-03-29 (×2): 30 mg via INTRAVENOUS
  Administered 2023-03-29: 25 mg via INTRAVENOUS
  Administered 2023-03-29: 100 mg via INTRAVENOUS
  Administered 2023-03-29: 70 mg via INTRAVENOUS

## 2023-03-29 MED ORDER — KETAMINE HCL 10 MG/ML IJ SOLN
INTRAMUSCULAR | Status: DC | PRN
  Administered 2023-03-29: 30 mg via INTRAVENOUS
  Administered 2023-03-29: 20 mg via INTRAVENOUS

## 2023-03-29 MED ORDER — SUGAMMADEX SODIUM 200 MG/2ML IV SOLN
INTRAVENOUS | Status: DC | PRN
  Administered 2023-03-29: 400 mg via INTRAVENOUS

## 2023-03-29 MED ORDER — AMISULPRIDE (ANTIEMETIC) 5 MG/2ML IV SOLN: 10.0000 mg | Freq: Once | INTRAVENOUS | Status: DC | PRN

## 2023-03-29 MED ORDER — ONDANSETRON HCL 4 MG/2ML IJ SOLN
INTRAMUSCULAR | Status: DC | PRN
  Administered 2023-03-29: 4 mg via INTRAVENOUS

## 2023-03-29 MED ORDER — HYDROMORPHONE HCL 1 MG/ML IJ SOLN
0.5000 mg | INTRAMUSCULAR | Status: DC | PRN
  Administered 2023-03-29: 0.5 mg via INTRAVENOUS
  Administered 2023-03-29 – 2023-03-30 (×5): 1 mg via INTRAVENOUS
  Filled 2023-03-29 (×6): qty 1

## 2023-03-29 MED ORDER — PROPOFOL 10 MG/ML IV BOLUS
INTRAVENOUS | Status: AC
  Filled 2023-03-29: qty 20

## 2023-03-29 MED ORDER — WATER FOR IRRIGATION, STERILE IR SOLN
Status: DC | PRN
Start: 2023-03-29 — End: 2023-03-29
  Administered 2023-03-29: 1000 mL

## 2023-03-29 MED ORDER — ACETAMINOPHEN 10 MG/ML IV SOLN
1000.0000 mg | Freq: Four times a day (QID) | INTRAVENOUS | Status: AC
  Administered 2023-03-29 – 2023-03-30 (×4): 1000 mg via INTRAVENOUS
  Filled 2023-03-29 (×4): qty 100

## 2023-03-29 MED ORDER — PROPOFOL 10 MG/ML IV BOLUS
INTRAVENOUS | Status: DC | PRN
  Administered 2023-03-29: 170 mg via INTRAVENOUS

## 2023-03-29 MED ORDER — PHENYLEPHRINE HCL-NACL 20-0.9 MG/250ML-% IV SOLN
INTRAVENOUS | Status: DC | PRN
  Administered 2023-03-29: 40 ug/min via INTRAVENOUS

## 2023-03-29 MED ORDER — HYDROMORPHONE HCL 1 MG/ML IJ SOLN
INTRAMUSCULAR | Status: AC
  Filled 2023-03-29: qty 1

## 2023-03-29 MED ORDER — ACETAMINOPHEN 10 MG/ML IV SOLN
INTRAVENOUS | Status: AC
  Filled 2023-03-29: qty 100

## 2023-03-29 MED ORDER — BUPIVACAINE LIPOSOME 1.3 % IJ SUSP
INTRAMUSCULAR | Status: AC
  Filled 2023-03-29: qty 20

## 2023-03-29 MED ORDER — DIPHENHYDRAMINE HCL 12.5 MG/5ML PO ELIX: 12.5000 mg | ORAL_SOLUTION | Freq: Four times a day (QID) | ORAL | Status: DC | PRN

## 2023-03-29 MED ORDER — OXYCODONE HCL 5 MG PO TABS
5.0000 mg | ORAL_TABLET | ORAL | Status: DC | PRN
  Administered 2023-03-29 – 2023-04-03 (×17): 5 mg via ORAL
  Filled 2023-03-29 (×17): qty 1

## 2023-03-29 MED ORDER — ROCURONIUM BROMIDE 10 MG/ML (PF) SYRINGE
PREFILLED_SYRINGE | INTRAVENOUS | Status: AC
  Filled 2023-03-29: qty 10

## 2023-03-29 MED ORDER — INSULIN ASPART 100 UNIT/ML IJ SOLN
0.0000 [IU] | Freq: Three times a day (TID) | INTRAMUSCULAR | Status: DC
  Administered 2023-03-29: 3 [IU] via SUBCUTANEOUS
  Administered 2023-03-30: 2 [IU] via SUBCUTANEOUS
  Administered 2023-03-31: 3 [IU] via SUBCUTANEOUS
  Administered 2023-03-31 – 2023-04-01 (×3): 2 [IU] via SUBCUTANEOUS
  Administered 2023-04-01: 3 [IU] via SUBCUTANEOUS
  Administered 2023-04-02 (×3): 2 [IU] via SUBCUTANEOUS
  Administered 2023-04-03: 3 [IU] via SUBCUTANEOUS
  Administered 2023-04-03: 2 [IU] via SUBCUTANEOUS

## 2023-03-29 MED ORDER — PIPERACILLIN-TAZOBACTAM 3.375 G IVPB 30 MIN: 3.3750 g | Freq: Three times a day (TID) | INTRAVENOUS | Status: DC

## 2023-03-29 MED ORDER — STERILE WATER FOR INJECTION IJ SOLN
INTRAMUSCULAR | Status: DC | PRN
  Administered 2023-03-29: 4 mL via INTRAMUSCULAR

## 2023-03-29 MED ORDER — PHENYLEPHRINE HCL-NACL 20-0.9 MG/250ML-% IV SOLN
INTRAVENOUS | Status: AC
  Filled 2023-03-29: qty 250

## 2023-03-29 MED ORDER — SODIUM CHLORIDE 0.9 % IR SOLN
Status: DC | PRN
  Administered 2023-03-29: 1000 mL

## 2023-03-29 MED ORDER — FENTANYL CITRATE (PF) 250 MCG/5ML IJ SOLN
INTRAMUSCULAR | Status: AC
  Filled 2023-03-29: qty 5

## 2023-03-29 MED ORDER — FENTANYL CITRATE (PF) 100 MCG/2ML IJ SOLN
INTRAMUSCULAR | Status: DC | PRN
  Administered 2023-03-29: 150 ug via INTRAVENOUS
  Administered 2023-03-29 (×2): 100 ug via INTRAVENOUS

## 2023-03-29 MED ORDER — LABETALOL HCL 5 MG/ML IV SOLN
INTRAVENOUS | Status: DC | PRN
  Administered 2023-03-29: 5 mg via INTRAVENOUS

## 2023-03-29 MED ORDER — GLYCOPYRROLATE PF 0.2 MG/ML IJ SOSY
PREFILLED_SYRINGE | INTRAMUSCULAR | Status: DC | PRN
  Administered 2023-03-29: .2 mg via INTRAVENOUS

## 2023-03-29 MED ORDER — DEXAMETHASONE SODIUM PHOSPHATE 10 MG/ML IJ SOLN
INTRAMUSCULAR | Status: AC
  Filled 2023-03-29: qty 1

## 2023-03-29 MED ORDER — KETAMINE HCL 50 MG/5ML IJ SOSY
PREFILLED_SYRINGE | INTRAMUSCULAR | Status: AC
  Filled 2023-03-29: qty 5

## 2023-03-29 MED ORDER — SUCCINYLCHOLINE CHLORIDE 200 MG/10ML IV SOSY
PREFILLED_SYRINGE | INTRAVENOUS | Status: DC | PRN
  Administered 2023-03-29: 180 mg via INTRAVENOUS

## 2023-03-29 MED ORDER — ACETAMINOPHEN 10 MG/ML IV SOLN
INTRAVENOUS | Status: DC | PRN
  Administered 2023-03-29: 1000 mg via INTRAVENOUS

## 2023-03-29 MED ORDER — MIDAZOLAM HCL 5 MG/5ML IJ SOLN
INTRAMUSCULAR | Status: DC | PRN
  Administered 2023-03-29: 2 mg via INTRAVENOUS

## 2023-03-29 MED ORDER — LIDOCAINE 2% (20 MG/ML) 5 ML SYRINGE
INTRAMUSCULAR | Status: DC | PRN
  Administered 2023-03-29: 100 mg via INTRAVENOUS

## 2023-03-29 MED ORDER — MIDAZOLAM HCL 2 MG/2ML IJ SOLN
INTRAMUSCULAR | Status: AC
  Filled 2023-03-29: qty 2

## 2023-03-29 MED ORDER — FENTANYL CITRATE PF 50 MCG/ML IJ SOSY: 25.0000 ug | PREFILLED_SYRINGE | INTRAMUSCULAR | Status: DC | PRN

## 2023-03-29 MED ORDER — DEXAMETHASONE SODIUM PHOSPHATE 10 MG/ML IJ SOLN
INTRAMUSCULAR | Status: DC | PRN
  Administered 2023-03-29: 8 mg via INTRAVENOUS

## 2023-03-29 SURGICAL SUPPLY — 116 items
APPLICATOR COTTON TIP 6 STRL (MISCELLANEOUS) ×1 IMPLANT
APPLICATOR COTTON TIP 6IN STRL (MISCELLANEOUS) ×1 IMPLANT
APPLICATOR SURGIFLO ENDO (HEMOSTASIS) IMPLANT
BAG COUNTER SPONGE SURGICOUNT (BAG) IMPLANT
BAG LAPAROSCOPIC 12 15 PORT 16 (BASKET) ×1 IMPLANT
BAG RETRIEVAL 12/15 (BASKET) ×1 IMPLANT
BENZOIN TINCTURE PRP APPL 2/3 (GAUZE/BANDAGES/DRESSINGS) ×1 IMPLANT
BLADE SURG SZ10 CARB STEEL (BLADE) IMPLANT
CATH FOLEY 2WAY SLVR 18FR 30CC (CATHETERS) ×1 IMPLANT
CATH SILICONE 5CC 18FR (INSTRUMENTS) ×1 IMPLANT
CELLS DAT CNTRL 66122 CELL SVR (MISCELLANEOUS) ×1 IMPLANT
CHLORAPREP W/TINT 26 (MISCELLANEOUS) ×1 IMPLANT
CLIP LIGATING HEM O LOK PURPLE (MISCELLANEOUS) ×2 IMPLANT
CLIP LIGATING HEMO LOK XL GOLD (MISCELLANEOUS) ×2 IMPLANT
CLIP LIGATING HEMO O LOK GREEN (MISCELLANEOUS) ×1 IMPLANT
CNTNR URN SCR LID CUP LEK RST (MISCELLANEOUS) ×1 IMPLANT
CONNECTOR CATH FOLEY FEMALE LL (CATHETERS) IMPLANT
COVER MAYO STAND STRL (DRAPES) ×1 IMPLANT
COVER SURGICAL LIGHT HANDLE (MISCELLANEOUS) ×1 IMPLANT
COVER TIP SHEARS 8 DVNC (MISCELLANEOUS) ×1 IMPLANT
CUTTER ECHEON FLEX ENDO 45 340 (ENDOMECHANICALS) IMPLANT
DERMABOND ADVANCED .7 DNX12 (GAUZE/BANDAGES/DRESSINGS) ×2 IMPLANT
DRAIN CHANNEL RND F F (WOUND CARE) IMPLANT
DRAIN PENROSE 0.5X18 (DRAIN) IMPLANT
DRAPE ARM DVNC X/XI (DISPOSABLE) ×4 IMPLANT
DRAPE COLUMN DVNC XI (DISPOSABLE) ×1 IMPLANT
DRAPE SURG IRRIG POUCH 19X23 (DRAPES) ×1 IMPLANT
DRIVER NDL LRG 8 DVNC XI (INSTRUMENTS) ×2 IMPLANT
DRIVER NDLE LRG 8 DVNC XI (INSTRUMENTS) ×2 IMPLANT
DRSG TEGADERM 4X4.75 (GAUZE/BANDAGES/DRESSINGS) ×1 IMPLANT
ELECT PENCIL ROCKER SW 15FT (MISCELLANEOUS) ×1 IMPLANT
ELECT REM PT RETURN 15FT ADLT (MISCELLANEOUS) ×1 IMPLANT
FORCEPS BPLR FENES DVNC XI (FORCEP) ×1 IMPLANT
FORCEPS BPLR LNG DVNC XI (INSTRUMENTS) ×1 IMPLANT
FORCEPS PROGRASP DVNC XI (FORCEP) ×1 IMPLANT
GAUZE 4X4 16PLY ~~LOC~~+RFID DBL (SPONGE) IMPLANT
GLOVE BIO SURGEON STRL SZ 6.5 (GLOVE) ×2 IMPLANT
GLOVE BIOGEL PI IND STRL 7.5 (GLOVE) IMPLANT
GLOVE SURG LX STRL 7.5 STRW (GLOVE) ×2 IMPLANT
GOWN SRG XL LVL 4 BRTHBL STRL (GOWNS) ×1 IMPLANT
GOWN STRL REUS W/ TWL XL LVL3 (GOWN DISPOSABLE) ×2 IMPLANT
GOWN STRL SURGICAL XL XLNG (GOWN DISPOSABLE) ×2 IMPLANT
HOLDER FOLEY CATH W/STRAP (MISCELLANEOUS) ×1 IMPLANT
IRRIG SUCT STRYKERFLOW 2 WTIP (MISCELLANEOUS) ×1 IMPLANT
IRRIGATION SUCT STRKRFLW 2 WTP (MISCELLANEOUS) ×1 IMPLANT
KIT PROCEDURE DVNC SI (MISCELLANEOUS) IMPLANT
KIT TURNOVER KIT A (KITS) IMPLANT
LOOP VESSEL MAXI BLUE (MISCELLANEOUS) ×1 IMPLANT
MANIFOLD NEPTUNE II (INSTRUMENTS) ×1 IMPLANT
NDL ASPIRATION 22 (NEEDLE) ×1 IMPLANT
NDL INSUFFLATION 14GA 120MM (NEEDLE) ×1 IMPLANT
NEEDLE ASPIRATION 22 (NEEDLE) ×1 IMPLANT
NEEDLE INSUFFLATION 14GA 120MM (NEEDLE) ×1 IMPLANT
PACK ROBOT UROLOGY CUSTOM (CUSTOM PROCEDURE TRAY) ×1 IMPLANT
PAD POSITIONING PINK XL (MISCELLANEOUS) ×1 IMPLANT
PORT ACCESS TROCAR AIRSEAL 12 (TROCAR) ×1 IMPLANT
RELOAD STAPLE 60 2.6 WHT THN (STAPLE) ×3 IMPLANT
RELOAD STAPLE 60 4.1 GRN THCK (STAPLE) ×3 IMPLANT
RELOAD STAPLER GREEN 60MM (STAPLE) ×5 IMPLANT
RELOAD STAPLER WHITE 60MM (STAPLE) ×9 IMPLANT
RETRACTOR LONRSTAR 16.6X16.6CM (MISCELLANEOUS) IMPLANT
RETRACTOR STAY HOOK 5MM (MISCELLANEOUS) IMPLANT
RETRACTOR STER APS 16.6X16.6CM (MISCELLANEOUS) IMPLANT
RETRACTOR WND ALEXIS 18 MED (MISCELLANEOUS) ×1 IMPLANT
RTRCTR WOUND ALEXIS 18CM MED (MISCELLANEOUS) ×1 IMPLANT
SCISSORS MNPLR CVD DVNC XI (INSTRUMENTS) ×1 IMPLANT
SEAL UNIV 5-12 XI (MISCELLANEOUS) ×4 IMPLANT
SET CYSTO W/LG BORE CLAMP LF (SET/KITS/TRAYS/PACK) IMPLANT
SET TRI-LUMEN FLTR TB AIRSEAL (TUBING) ×1 IMPLANT
SLEEVE ADV FIXATION 12X100MM (TROCAR) ×1 IMPLANT
SOL ELECTROSURG ANTI STICK (MISCELLANEOUS) ×1 IMPLANT
SOLUTION ELECTROSURG ANTI STCK (MISCELLANEOUS) ×1 IMPLANT
SPIKE FLUID TRANSFER (MISCELLANEOUS) ×1 IMPLANT
SPONGE T-LAP 18X18 ~~LOC~~+RFID (SPONGE) ×1 IMPLANT
SPONGE T-LAP 4X18 ~~LOC~~+RFID (SPONGE) ×1 IMPLANT
STAPLER ECHELON LONG 60 440 (INSTRUMENTS) ×1 IMPLANT
STAPLER RELOAD GREEN 60MM (STAPLE) ×5 IMPLANT
STAPLER RELOAD WHITE 60MM (STAPLE) ×9 IMPLANT
STENT SET URETHERAL LEFT 7FR (STENTS) ×1 IMPLANT
STENT SET URETHERAL RIGHT 7FR (STENTS) ×1 IMPLANT
SUCTION TUBE FRAZIER 12FR DISP (SUCTIONS) IMPLANT
SURGIFLO W/THROMBIN 8M KIT (HEMOSTASIS) IMPLANT
SUT CHROMIC 4 0 RB 1X27 (SUTURE) ×1 IMPLANT
SUT ETHILON 3 0 PS 1 (SUTURE) ×1 IMPLANT
SUT MNCRL AB 4-0 PS2 18 (SUTURE) ×2 IMPLANT
SUT PDS AB 0 CT1 36 (SUTURE) ×3 IMPLANT
SUT PDS AB 1 CT1 27 (SUTURE) ×3 IMPLANT
SUT SILK 3 0 SH 30 (SUTURE) IMPLANT
SUT SILK 3 0 SH CR/8 (SUTURE) ×1 IMPLANT
SUT STRATA PDS 2-0 15 CT-2.5 (SUTURE) IMPLANT
SUT STRATAFIX SPIRAL 3-0 PDS+ (SUTURE) IMPLANT
SUT VIC AB 2-0 CT1 27XBRD (SUTURE) IMPLANT
SUT VIC AB 2-0 CT1 TAPERPNT 27 (SUTURE) IMPLANT
SUT VIC AB 2-0 SH 18 (SUTURE) IMPLANT
SUT VIC AB 2-0 SH 27X BRD (SUTURE) IMPLANT
SUT VIC AB 2-0 UR5 27 (SUTURE) ×4 IMPLANT
SUT VIC AB 2-0 UR6 27 (SUTURE) IMPLANT
SUT VIC AB 3-0 SH 27X BRD (SUTURE) ×2 IMPLANT
SUT VIC AB 3-0 SH 27XBRD (SUTURE) ×3 IMPLANT
SUT VIC AB 4-0 RB1 27XBRD (SUTURE) ×4 IMPLANT
SUT VICRYL 0 27 CT2 27 ABS (SUTURE) IMPLANT
SUT VICRYL 0 UR6 27IN ABS (SUTURE) ×1 IMPLANT
SUT VLOC BARB 180 ABS3/0GR12 (SUTURE) ×1 IMPLANT
SUTURE STRAT PDS 2-0 15 CT-2.5 (SUTURE) IMPLANT
SUTURE STRATFX SPIRAL 3-0 PDS+ (SUTURE) IMPLANT
SUTURE VLOC BRB 180 ABS3/0GR12 (SUTURE) ×1 IMPLANT
SYR CONTROL 10ML LL (SYRINGE) ×1 IMPLANT
SYS BAG RETRIEVAL 10MM (BASKET) IMPLANT
SYS KII OPTICAL ACCESS 15MM (TROCAR) ×1 IMPLANT
SYSTEM BAG RETRIEVAL 10MM (BASKET) IMPLANT
SYSTEM KII OPTICAL ACCESS 15MM (TROCAR) ×1 IMPLANT
SYSTEM UROSTOMY GENTLE TOUCH (WOUND CARE) ×1 IMPLANT
TROCAR ADV FIXATION 12X100MM (TROCAR) ×1 IMPLANT
TROCAR Z-THREAD FIOS 5X100MM (TROCAR) IMPLANT
TUBING CONNECTING 10 (TUBING) ×1 IMPLANT
WATER STERILE IRR 1000ML POUR (IV SOLUTION) ×1 IMPLANT

## 2023-03-29 NOTE — Progress Notes (Signed)
   03/29/23 1122  TOC Brief Assessment  Insurance and Status Reviewed  Patient has primary care physician Yes  Home environment has been reviewed Resides in single family home with spouse and children  Prior level of function: Independent at baseline with ADLs  Prior/Current Home Services No current home services  Social Drivers of Health Review SDOH reviewed no interventions necessary  Readmission risk has been reviewed Yes  Transition of care needs no transition of care needs at this time

## 2023-03-29 NOTE — Transfer of Care (Signed)
 Immediate Anesthesia Transfer of Care Note  Patient: Brad [name]  Procedure(s) Performed: CYSTOSCOPY WITH ICG/ROBOT ASSISTED LAPAROSCOPIC RADICAL CYSTOPROSTATECTOMY (Pelvis) XI ROBOTIC ASSISTED PELVIC LYMPH NODE DISSECTION AND CONDUIT DIVERSION (Pelvis)  Patient Location: PACU  Anesthesia Type:General  Level of Consciousness: awake, alert , oriented, and patient cooperative  Airway & Oxygen Therapy: Patient Spontanous Breathing and Patient connected to face mask oxygen  Post-op Assessment: Report given to RN and Post -op Vital signs reviewed and stable  Post vital signs: Reviewed and stable  Last Vitals:  Vitals Value Taken Time  BP 144/81 03/29/23 1300  Temp    Pulse 61 03/29/23 1304  Resp    SpO2 100 % 03/29/23 1304  Vitals shown include unfiled device data.  Last Pain:  Vitals:   03/29/23 0601  TempSrc: Oral  PainSc:          Complications: No notable events documented.

## 2023-03-29 NOTE — Anesthesia Postprocedure Evaluation (Signed)
 Anesthesia Post Note  Patient: Mathis Cortina  Procedure(s) Performed: CYSTOSCOPY WITH ICG/ROBOT ASSISTED LAPAROSCOPIC RADICAL CYSTOPROSTATECTOMY (Pelvis) XI ROBOTIC ASSISTED PELVIC LYMPH NODE DISSECTION AND CONDUIT DIVERSION (Pelvis)     Patient location during evaluation: PACU Anesthesia Type: General Level of consciousness: awake and alert Pain management: pain level controlled Vital Signs Assessment: post-procedure vital signs reviewed and stable Respiratory status: spontaneous breathing, nonlabored ventilation and respiratory function stable Cardiovascular status: blood pressure returned to baseline and stable Postop Assessment: no apparent nausea or vomiting Anesthetic complications: no   No notable events documented.  Last Vitals:  Vitals:   03/29/23 1430 03/29/23 1449  BP: (!) 148/84 (!) 164/84  Pulse: 63 65  Resp:  20  Temp:    SpO2: 95% 100%    Last Pain:                 Collene Schlichter

## 2023-03-29 NOTE — H&P (Signed)
 Brad [name] is an 63 y.o. male.    Chief Complaint: Pre-OP Cystoprostatectomy  HPI:   1 - Muscle Invasive, Aggressive Variant Bladder Cancer - T2G3 inverted pattern urothelial cancer by TURBT 08/2022. CT localized, bilateral single ureters. Cr 1.3. Received [facility] neoadjuvant udner care of Dr. Cherly [name] finishing 12/2022, restaging ct chest/abd/pelvis no mets or advanced disease.    PMH sig for obesity/DM2/monjuro (a1c 6s), CAD/MI (no stent, follows Brad [name], no limitiation). Reitred from Brad [name], now does some pastoral work. Wife and daugheter very involged has a son and 1yo grandson as well. HIs PCP is Brad Erp MD with [facility]   Today " [name] " is seen  to proceed with cystoprostatectomy. Admitted last night for bowel prep. Hgb 12.1, Cr 1.03. No interval fevers.     Past Medical History:  Diagnosis Date   Abnormal ultrasound of carotid artery 04/2014   no significant obstruction   Ataxia 04/2014   Cancer (HCC)    Diabetes (HCC)    EKG abnormalities 04/2014   poor R wave progression, NSR   Former smoker    25 years x 1.5 ppd, stopped 04/2014   Gait disturbance 04/2014   s/p stroke   H/O echocardiogram 04/2014   moderate LVH, 60-65% EF, no valve disease   History of heart attack    Hypertension    Hypertensive heart disease 04/2014   Intraparenchymal hemorrhage of brain [facility]) 05/05/2014   hospitalization [facility]   Myocardial infarction [facility])    Obesity    Prediabetes    Short-term memory loss 04/2014   Sleep apnea    Stroke (HCC) 04/2014    Past Surgical History:  Procedure Laterality Date   APPENDECTOMY     CHOLECYSTECTOMY     CIRCUMCISION     CYSTOSCOPY N/A 09/26/2022   Procedure: CYSTOSCOPY;  Surgeon: Rene Paci, MD;  Location: [facility] ORS;  Service: Urology;  Laterality: N/A;  60 MINUTES   HEMORRHOID SURGERY     IR IMAGING GUIDED PORT INSERTION  11/08/2022    Family History  Problem Relation Age of Onset   Heart  disease Mother    Hypertension Mother    Sudden death Mother    Hypertension Brother    Aneurysm Paternal Grandmother    Social History:  reports that he quit smoking about 8 years ago. His smoking use included cigarettes. He started smoking about 33 years ago. He has a 37.5 pack-year smoking history. He has been exposed to tobacco smoke. He has never used smokeless tobacco. He reports that he does not drink alcohol and does not use drugs.  Allergies: No Known Allergies  Medications Prior to Admission  Medication Sig Dispense Refill   acetaminophen (TYLENOL) 500 MG tablet Take 500 mg by mouth every 6 (six) hours as needed for moderate pain (pain score 4-6).     amLODipine (NORVASC) 10 MG tablet Take 1 tablet (10 mg total) by mouth daily. 90 tablet 3   Aspirin-Caffeine (BC FAST PAIN RELIEF PO) Take 1 packet by mouth daily as needed (pain).     chlorthalidone (HYGROTON) 25 MG tablet Take 1 tablet (25 mg total) by mouth daily. (Patient taking differently: Take 25 mg by mouth daily as needed (fluid retention).) 30 tablet 2   Cholecalciferol (VITAMIN D3 MAXIMUM STRENGTH) 125 MCG (5000 UT) capsule Take 5,000 Units by mouth daily.     gabapentin (NEURONTIN) 100 MG capsule Take 1 capsule (100 mg total) by mouth at bedtime for 3 days, THEN 2  capsules (200 mg total) at bedtime. (Patient taking differently: Take 100 mg at bedtime) 63 capsule 0   ibuprofen (ADVIL) 800 MG tablet Take 400 mg by mouth every 6 (six) hours as needed.     labetalol (NORMODYNE) 200 MG tablet TAKE 1 TABLET(200 MG) BY MOUTH TWICE DAILY 180 tablet 3   losartan (COZAAR) 100 MG tablet Take 1 tablet (100 mg total) by mouth daily. 90 tablet 3   methocarbamol (ROBAXIN) 500 MG tablet Take 1 tablet (500 mg total) by mouth 2 (two) times daily. (Patient taking differently: Take 500 mg by mouth 2 (two) times daily as needed for muscle spasms.) 20 tablet 0   Multiple Vitamins-Minerals (ONE-A-DAY MENS 50+ ADVANTAGE) TABS Take 1 tablet by mouth  daily.     tadalafil (CIALIS) 20 MG tablet TAKE 1 TABLET(20 MG) BY MOUTH DAILY AS NEEDED 30 tablet 3   blood glucose meter kit and supplies KIT Dispense based on patient and insurance preference. Check fasting blood sugar once daily ICD10 R73.03 1 each 0   glucose blood test strip Use as instructed to monitor FSBS 1x daily. Dx: R73.09 100 each 12   Lancets (ONETOUCH ULTRASOFT) lancets Use as instructed 100 each 12   MOUNJARO 12.5 MG/0.5ML Pen Inject 12.5 mg into the skin once a week. (Patient not taking: Reported on 03/28/2023) 2 mL 2    Results for orders placed or performed during the hospital encounter of 03/28/23 (from the past 48 hours)  Surgical pcr screen     Status: None   Collection Time: 03/28/23  6:34 PM   Specimen: Nasal Mucosa; Nasal Swab  Result Value Ref Range   MRSA, PCR NEGATIVE NEGATIVE   Staphylococcus aureus NEGATIVE NEGATIVE    Comment: (NOTE) The Xpert SA Assay (FDA approved for NASAL specimens in patients 48 years of age and older), is one component of a comprehensive surveillance program. It is not intended to diagnose infection nor to guide or monitor treatment. Performed at [facility], [address], [place], [place] [address]   CBC     Status: Abnormal   Collection Time: 03/28/23  6:37 PM  Result Value Ref Range   WBC 7.1 4.0 - 10.5 K/uL   RBC 3.97 (L) 4.22 - 5.81 MIL/uL   Hemoglobin 12.1 (L) 13.0 - 17.0 g/dL   HCT 60.4 (L) 54.0 - 98.1 %   MCV 91.7 80.0 - 100.0 fL   MCH 30.5 26.0 - 34.0 pg   MCHC 33.2 30.0 - 36.0 g/dL   RDW 19.1 47.8 - 29.5 %   Platelets 220 150 - 400 K/uL   nRBC 0.0 0.0 - 0.2 %    Comment: Performed at [facility], [address] [place], [place] [address]  Comprehensive metabolic panel     Status: Abnormal   Collection Time: 03/28/23  6:37 PM  Result Value Ref Range   Sodium 137 135 - 145 mmol/L   Potassium 3.5 3.5 - 5.1 mmol/L   Chloride 105 98 - 111 mmol/L   CO2 24 22 - 32 mmol/L    Glucose, Bld 134 (H) 70 - 99 mg/dL    Comment: Glucose reference range applies only to samples taken after fasting for at least 8 hours.   BUN 16 6 - 20 mg/dL   Creatinine, Ser 8.65 0.61 - 1.24 mg/dL   Calcium 9.1 8.9 - 78.4 mg/dL   Total Protein 7.0 6.5 - 8.1 g/dL   Albumin 3.9 3.5 - 5.0 g/dL  AST 24 15 - 41 U/L   ALT 28 0 - 44 U/L   Alkaline Phosphatase 54 38 - 126 U/L   Total Bilirubin 0.5 0.0 - 1.2 mg/dL   GFR, Estimated >16 >10 mL/min    Comment: (NOTE) Calculated using the CKD-EPI Creatinine Equation (2021)    Anion gap 8 5 - 15    Comment: Performed at [facility], [address] [place], [place] [address]  Type and screen     Status: None   Collection Time: 03/28/23  6:37 PM  Result Value Ref Range   ABO/RH(D) O POS    Antibody Screen NEG    Sample Expiration      03/31/2023,2359 Performed at [facility], [address], [place], [place] [address]    No results found.  Review of Systems  Constitutional:  Negative for chills and fever.  All other systems reviewed and are negative.   Blood pressure 137/83, pulse 75, temperature (!) 97.5 F (36.4 C), temperature source Oral, resp. rate 19, SpO2 95%. Physical Exam Vitals reviewed.  Constitutional:      Comments: Very pleasant. Large stature.  HENT:     Head: Normocephalic.  Eyes:     Pupils: Pupils are equal, round, and reactive to light.  Cardiovascular:     Rate and Rhythm: Normal rate.  Pulmonary:     Effort: Pulmonary effort is normal.  Abdominal:     General: Abdomen is flat.     Comments: Stomal marking site noted  Genitourinary:    Comments: No CVAT at present Musculoskeletal:        General: Normal range of motion.     Cervical back: Normal range of motion.  Skin:    General: Skin is warm.  Neurological:     Mental Status: He is alert.  Psychiatric:        Mood and Affect: Mood normal.      Assessment/Plan  Proceed as planned with cysto-icg,  robotic cystoprostatectomy, node dissection, conduit diversion. Risks, benefits, alternatives, expected peri-op course discussed previously and reiterated today.   [name]., MD 03/29/2023, 6:42 AM

## 2023-03-29 NOTE — Brief Op Note (Signed)
 03/28/2023 - 03/29/2023  12:48 PM  PATIENT:  Brad [name]  63 y.o. male  PRE-OPERATIVE DIAGNOSIS:  BLADDER CANCER  POST-OPERATIVE DIAGNOSIS:  BLADDER CANCER  PROCEDURE:  Procedure(s): CYSTOSCOPY WITH ICG/ROBOT ASSISTED LAPAROSCOPIC RADICAL CYSTOPROSTATECTOMY (N/A) XI ROBOTIC ASSISTED PELVIC LYMPH NODE DISSECTION AND CONDUIT DIVERSION (N/A)  SURGEON:  Surgeons and Role:    * Manny, Delbert Phenix., MD - Primary  PHYSICIAN ASSISTANT:   ASSISTANTS: Harrie Foreman PA   ANESTHESIA:   local and general  EBL:  300 mL   BLOOD ADMINISTERED:none  DRAINS:  1- JP to bulb; 2 - RLQ Urosotmy to gravity    LOCAL MEDICATIONS USED:  MARCAINE     SPECIMEN:  Source of Specimen:  1- pelvic lymph nodes; 2- ureteral margins; 3 -bladder + prostate   DISPOSITION OF SPECIMEN:  PATHOLOGY  COUNTS:  YES  TOURNIQUET:  * No tourniquets in log *  DICTATION: .Other Dictation: Dictation Number [id number]  PLAN OF CARE: Admit to inpatient   PATIENT DISPOSITION:  PACU - hemodynamically stable.   Delay start of Pharmacological VTE agent (>24hrs) due to surgical blood loss or risk of bleeding: yes

## 2023-03-29 NOTE — Plan of Care (Signed)
 Patient is doing well post op.  Sites all look good and output from JP has slowed from PACU volumes.  Pain is managed currently with PRN orders.

## 2023-03-29 NOTE — Anesthesia Procedure Notes (Signed)
 Procedure Name: Intubation Date/Time: 03/29/2023 7:48 AM  Performed by: Ponciano Ort, CRNAPre-anesthesia Checklist: Patient identified, Emergency Drugs available, Suction available and Patient being monitored Patient Re-evaluated:Patient Re-evaluated prior to induction Oxygen Delivery Method: Circle system utilized Preoxygenation: Pre-oxygenation with 100% oxygen Induction Type: IV induction Ventilation: Mask ventilation without difficulty and Oral airway inserted - appropriate to patient size Laryngoscope Size: Mac and 4 Grade View: Grade II Tube type: Oral Tube size: 7.5 mm Number of attempts: 1 Airway Equipment and Method: Stylet and Oral airway Placement Confirmation: ETT inserted through vocal cords under direct vision, positive ETCO2 and breath sounds checked- equal and bilateral Secured at: 24 cm Tube secured with: Tape Dental Injury: Teeth and Oropharynx as per pre-operative assessment

## 2023-03-29 NOTE — Op Note (Signed)
 NAMEDeshon, [name] [facility] MEDICAL RECORD NO: [id number] ACCOUNT NO: [id number] DATE OF BIRTH: 1961-01-03 FACILITY: [facility]ATION: [id number] PHYSICIAN: Sebastian Ache, MD  Operative Report   DATE OF PROCEDURE: 03/29/2023  PREOPERATIVE DIAGNOSIS:  Muscle invasive bladder cancer.  PROCEDURE PERFORMED: 1.  Cystoscopy with injection of indocyanine green dye / sentinal lymphagriography 2.  Robotic-assisted laparoscopic radical cystectomy with prostatectomy, bilateral pelvic lymph node dissection, ileal conduit urinary diversion.  ESTIMATED BLOOD LOSS:  300 mL.  MEDICATIONS:  None.  SPECIMENS: 1. Bilateral ureteral margins. Frozen section negative. 2. Bilateral final ureteral margins. 3. Right external iliac lymph nodes. 4. Right obturator lymph nodes. 5. Right common iliac lymph nodes. 6. Iliac bifurcation lymph nodes. 7. Left common iliac lymph nodes. 8. Left external iliac lymph nodes. 9. Left internal iliac lymph nodes. 10. Left obturator lymph nodes. 11. Bladder plus prostate en bloc.  FINDINGS: 1.  Large body habitus as anticipated. 2.  Bilateral sentinel lymph nodes denoted on pathology requisition.  ASSISTANT:  Harrie Foreman, PA-C  DRAINS: 1.  Right lower quadrant urostomy to gravity drainage, right (red), left (blue) Bander stents. 2.  Jackson-Pratt drain to bulb suction.  INDICATIONS:  The patient is a very pleasant and quite vigorous 63 year old man with a history of high-grade aggressive variant muscle invasive bladder cancer. He has been on a curative intent path in the adjuvant chemotherapy, which he has tolerated  quite well. With restaging imaging, it was unremarkable and then planned versus prostatectomy with curative intent. He presents for this today. He was met yesterday for labs and bowel prep and sterile marking. Informed consent was obtained and placed in  medical record.  DESCRIPTION OF PROCEDURE:  The patient being Brad [name] verified and identified, procedure  being cystoscopy with Indocyanine green dye injection, robotic assisted cystoprostatectomy, ileal conduit urinary diversion was confirmed.  Procedure timeout was  performed.  Intravenous antibiotics was administered.  General endotracheal anesthesia induced.  The patient was placed into a low lithotomy position.  Sterile field created.  Prepped and draped the patient's penis, perineum and proximal thighs using  iodine and his infraxiphoid abdomen using chlorhexidine gluconate after he was further fashioned temporally using continuous staple foam padding supraxiphoid chest.  His arms were tucked to the side with padded gel rolls.  A test of steep  Trendelenburg positioning was performed and found to be suitably positioned.  Cystourethroscopy was performed using 24-French sheath with 0-degree lens.  Anterior and posterior urethra was unremarkable.  Did have somewhat high-riding bladder neck.   The patient's urinary bladder revealed minimal intraluminal residual tumor.  2 mL of Indocyanine green dye was injected across the submucosal mucosal blebs in the area of the intertrigone area for  and prior to resection site and the cystoscope was  exchanged for a new silicone Foley catheter per urethra to straight drain inciting radiating was then injected across several submucosal blebs in the intertrigo area for sentinel lymph angiography and a silicone type Foley catheter was placed per urethra  for straight drain.  Next, a high flow low pressure pneumoperitoneum was obtained in Veress technique in the supraumbilical midline having passed the aspiration and drop test.  An 8 mm robotic camera port was then placed without any complication.   Laparoscopic examination of peritoneal cavity revealed no significant adhesions. No visceral injury.  Additional ports were placed as follows: Left paramedian 8 mm robotic port, left far lateral 8-mm robotic port.  Right far lateral 12-mm AirSeal assist  port, right paramedian 8  mm robotic  port.  Right paramedian 15 mm assistant port at the previously marked stomal site.  Robot was docked and passed the electronic checks.    Sentinal lymphagrioaphy revealed excellent uptake of bladder tumor area and several lymphatic channels courising towards bilateral pelvic node fields.   Next, attention was directed at the left retroperitoneal dissection.  Incision was  made lateral to the descending colon from the area of the iliac vessel superiorly for a distance of approximately 18 cm and then distally coursing lateral to the left medial umbilical ligament towards the area of the anterior abdominal wall.  This  created a large retroperitoneal flap, which was retracted medially.  The left ureter was encountered coursing the iliac vessels marked with the vessel loop dissected proximally to position approximately 3 or 4 cm above the gonadal crossing.  Then  distally to the ureterovesical junction were doubly clipped and ligated, proximal clip containing a dye tag suture. Frozen section negative for carcinoma. Left ureter stuck out of true pelvis.  Next, left-sided pelvic lymph node dissection was performed,  the first left external iliac group with boundaries being the left external iliac artery, vein, pelvic side wall.  Lymphostasis achieved with cold clips. Next, the left obturator group was dissected free with the boundaries being left external iliac  vein, pelvic sidewall, obturator nerve. Lymphostasis achieved with cold clips. The left obturator nerve was inspected with following maneuvers and found to be uninjured. Tissue overlying the common iliac nodes from the aortic bifurcation, iliac  bifurcation was then mobilized.  Lymphostasis achieved with cold clips.  These were labeled  left common iliac lymph nodes. Finally, tissue overlying the left internal iliac lymph node from the area of the iliac bifurcation to the superficial artery was dissected free,  set aside labeled, There were  several packets that contained sentinel lymph nodes and these were denoted on the pathology requisition. The left bladder wall was then swiped away from the pelvic sidewall towards the area of endopelvic fascia, which  was then swept away all the way to the apex of the prostate. This completed the left retroperitoneal resection.  Attention was directed to the right side. The ileocecal junction was identified. There were some loose adhesions that were taken down  consistent with his prior appendectomy and a suitable piece of distal ileum was noted at approximately 15 cm proximal to ileocecal junction. This was marked with silk tag suture with a clip distal to this to the proximal disorientation. Incision was then  made lateral to the ascending colon near the cecum superiorly, distance was approximately 18 cm and then distally crossing lateral to the medial umbilical ligament with a Y-shaped extension coursing along the iliac vessels towards the aortic  bifurcation. This created a large right-sided retroperitoneal flap retracting medially. The right ureter was encountered as it courses to iliac vessels, dissected proximally to the area of the gonadal crossing distally to the ureterovesical junction, was  thoroughly clipped and ligated with proximal clip containing a Dedman tag suture.  Frozen section negative for carcinoma. Right ureter was tucked out of the true pelvis.  Next, a right-sided lymphadenectomy was performed as per the left. Following this,  the right obturator nerve was also placed and found to be uninjured. Additional lymphatic tissue was taken from the area of the aortic bifurcation. Having exposed the aortic bifurcation, a retroperitoneal tunnel was created and the left ureter was  brought through to the right side and the right ureter, left ureter, terminal ileum tag sutures  were placed into a Hemovac clip for later conduit diversion. Posterior dissection was then performed by creating an  inverted U in the posterior peritoneum  creating a flap. The dissection proceeded within the peritoneal flap behind the vas deferens, seminal vesicles all the way to the apex of the prostate, which was noted by anterior curvature. This exposed the vascular pedicles of the bladder and prostate,  which were controlled with Sabia load stapler x2 on each side which showed excellent hemostatic control of the bladder and prostate. Space of Retzius then developed between the median umbilical ligaments down to the area of the dorsal venous complex was  carefully controlled using Green load stapler. The entire specimen was placed on endocath bag. The membranous urethra was transected as was the in situ Foley catheter after clipping. This completely freed up the bladder plus prostate en bloc specimen was placed  in Endocatch bag for later retrieval. The membranous urethral stump was oversewn using running V-Loc suture. A digital rectal exam was performed using indicator glove and no evidence of rectal violation was noted. We achieved the goals of extirpated  portion of the procedure today. Hemostasis was excellent. Sponge and needle counts were correct. Closed suction drain was brought through the  left lateral most robotic port site in the peritoneal cavity, the specimen string was brought through the  left paramedian robotic port site and the right ureter, left ureter, terminal ileum tag sutures were grasped with a self-locking grasper via the 15-mm right paramedian port site. The robot was then undocked.  The specimen was retrieved by extending  previous camera  site superiorly. This was approximately 8 cm as the conduit site was also quite high. This created better access to this. The bladder and prostate specimen was removed and set aside for pathology. A wound retractor was then placed through  this and the right ureter, left ureter, terminal ileum were then brought through this. The abdominal wall was quite  thick. There appeared to be sufficient length of these structures for conduit formation, which was quite favorable. Next, an approximately  18 cm segment of the distal ileum was taken out of continuity at the previously marked site using Green load stapler proximal to distal. The mesentery was developed using 1.5 loads of Curbow load stapler, distal, one load proximal. Obtained exquisite  care to avoid revascularization of the conduit anastomotic segments. The conduit was then lined through retroperitoneal orientation and bowel bowel anastomosis was performed using 1.5 loads of Green load stapler on the antemesenteric border. Free end was  oversewn using running silk with a separate interrupting layer of running silk. Acute angle was bolstered with interrupted silk and the mesenteric defect was reapproximated using interrupted silk x3. Bowel bowel anastomosis was visually viable, palpably  patent and delivered to the abdominal cavity. The proximal staple line of the conduit was excluded using running Vicryl. Distal staple line was removed.  Attention was directed to tight ureteroenteric anastomosis, first with the left ureter. Final  margins set aside for pathology. A 4-mm segment of the proximal conduit was excised. Four mucosal everting sutures were placed and a  stitch was applied of 4-0 Vicryl and a blue-colored Bander stent was placed to 28 cm anastomosis.  Then, two separate  interrupting suture lines of 4-0 Vicryl were used, which showed an excellent tension-free apposition of the left radial mucosa to \\mucosa . A color suture was used to anchor the Bander stent in the midline of the conduit to prevent early inadvertent  displacement. A mirror image of ureteroenteric anastomosis was performed on the right ureter by placing red-colored Bander stent to a 28 cm anastomosis and this was performed on the contralateral side of the conduit. This was also anchored in place. I  was quite happy with the viability  and geometry of this.  Next, a quarter-sized segment of skin and subcutaneous tissue was dissected down to the level of the previously marked stomal site below the fascia, which was carefully dilated to accommodate  three surgeons fingers. The distal conduit was brought through this. Fascial anchoring sutures were applied below the fascia as were four rosebud sutures in a quadrant fashion revealing excellent rose budding in the stoma. Attention was directed at  closure of the traction site at the level of the fascia which was reapproximated using figure-of eight x6 followed by approximation  Vicryl. All incision sites were infiltrated with dilute lipolyzed Marcaine and closed at the level of the skin using  subcuticular Monocryl followed by Dermabond.   Stomal appliances placed.  Procedure was then terminated.  The patient tolerated the procedure well.  No immediate periprocedural  complications.  The patient was taken to postanesthesia care unit in stable condition.  Plan for observation admission.  Please note, first assistant, Harrie Foreman, was crucial for all portions of surgery today, she provided invaluable retraction, suctioning, vascular clipping, vascular stapling, robotic instrument exchange in order for assistance.   SHY D: 03/29/2023 1:01:27 pm T: 03/29/2023 10:00:00 pm  JOB: [id number]/ [id number]

## 2023-03-29 NOTE — Discharge Instructions (Addendum)
 1- Drain Sites - You may have some mild persistent drainage from old drain site for several days, this is normal. This can be covered with cotton gauze for convenience.  2 - Stiches - Your stitches are all dissolvable. You may notice a "loose thread" at your incisions, these are normal and require no intervention. You may cut them flush to the skin with fingernail clippers if needed for comfort.  3 - Diet - No restrictions  4 - Activity - No heavy lifting / straining (any activities that require valsalva or "bearing down") x 4 weeks. Otherwise, no restrictions.  5 - Bathing - You may shower immediately. Do not take a bath or get into swimming pool where incision sites are submersed in water x 4 weeks.   6 - When to Call the Doctor - Call MD for any fever >102, any acute wound problems, or any severe nausea / vomiting. You can call the [facility] [phone]) 24 hours a day 365 days a year. It will roll-over to the answering service and on-call physician after hours.   You may resume Mounjaro 10 days after surgery.  You may resume aspirin, advil, aleve, vitamins, and supplements 7 days after surgery.

## 2023-03-29 NOTE — Anesthesia Procedure Notes (Signed)
 Arterial Line Insertion Start/End2/28/2025 7:47 AM, 03/29/2023 7:52 AM Performed by: Lezlie Lye, CRNA, anesthesiologist  Patient location: Pre-op. Preanesthetic checklist: patient identified, IV checked, site marked, risks and benefits discussed, surgical consent, monitors and equipment checked, pre-op evaluation, timeout performed and anesthesia consent Lidocaine 1% used for infiltration Left, radial was placed Catheter size: 20 G Hand hygiene performed  and maximum sterile barriers used   Attempts: 1 Procedure performed without using ultrasound guided technique. Following insertion, dressing applied and Biopatch. Post procedure assessment: normal and unchanged  Patient tolerated the procedure well with no immediate complications.

## 2023-03-30 ENCOUNTER — Encounter (HOSPITAL_COMMUNITY): Payer: Self-pay | Admitting: Urology

## 2023-03-30 LAB — HEMOGLOBIN AND HEMATOCRIT, BLOOD
HCT: 34.1 % — ABNORMAL LOW (ref 39.0–52.0)
Hemoglobin: 11.1 g/dL — ABNORMAL LOW (ref 13.0–17.0)

## 2023-03-30 LAB — BASIC METABOLIC PANEL
Anion gap: 10 (ref 5–15)
BUN: 22 mg/dL — ABNORMAL HIGH (ref 6–20)
CO2: 24 mmol/L (ref 22–32)
Calcium: 8.8 mg/dL — ABNORMAL LOW (ref 8.9–10.3)
Chloride: 105 mmol/L (ref 98–111)
Creatinine, Ser: 1.53 mg/dL — ABNORMAL HIGH (ref 0.61–1.24)
GFR, Estimated: 52 mL/min — ABNORMAL LOW (ref >60)
Glucose, Bld: 132 mg/dL — ABNORMAL HIGH (ref 70–99)
Potassium: 3.7 mmol/L (ref 3.5–5.1)
Sodium: 139 mmol/L (ref 135–145)

## 2023-03-30 LAB — GLUCOSE, CAPILLARY
Glucose-Capillary: 123 mg/dL — ABNORMAL HIGH (ref 70–99)
Glucose-Capillary: 129 mg/dL — ABNORMAL HIGH (ref 70–99)
Glucose-Capillary: 111 mg/dL — ABNORMAL HIGH (ref 70–99)
Glucose-Capillary: 105 mg/dL — ABNORMAL HIGH (ref 70–99)

## 2023-03-30 NOTE — Progress Notes (Addendum)
 1 Day Post-Op Subjective: The patient is doing well.  No nausea or vomiting. Pain is adequately controlled.  Ambulating.  Tolerating sips and chips.  Small flatus.  Objective: Vital signs in last 24 hours: Temp:  [96.9 F (36.1 C)-99 F (37.2 C)] 97.5 F (36.4 C) (03/01 1050) Pulse Rate:  [59-84] 81 (03/01 1050) Resp:  [12-20] 19 (03/01 1050) BP: (95-164)/(78-97) 127/82 (03/01 1050) SpO2:  [95 %-100 %] 100 % (03/01 1050) Arterial Line BP: (164-177)/(75-91) 164/80 (02/28 1400)  Intake/Output from previous day: 02/28 0701 - 03/01 0700 In: 2766.3 [P.O.:60; I.V.:2264.6; IV Piggyback:441.7] Out: 3015 [Urine:1950; Drains:765; Blood:300] Intake/Output this shift: Total I/O In: -  Out: 330 [Urine:150; Drains:180]  Physical Exam:  General: Alert and oriented. Lungs: Normal work of breathing GI: Soft, Nondistended.  Wounds c/d/I.  Soma pink / health draining urine with stents in place.  JP with scan serosanguinous fluid.   Incisions: Clean and dry. GU: Crusted blood at tip of meatus.  Extremities: Nontender, no erythema, no edema.  Lab Results: Recent Labs    03/28/23 1837 03/29/23 1304 03/30/23 0817  HGB 12.1* 11.6* 11.1*  HCT 36.4* 34.9* 34.1*          Recent Labs    03/28/23 1837 03/30/23 0817  CREATININE 1.03 1.53*           Results for orders placed or performed during the hospital encounter of 03/28/23 (from the past 24 hours)  Glucose, capillary     Status: Abnormal   Collection Time: 03/29/23 12:56 PM  Result Value Ref Range   Glucose-Capillary 154 (H) 70 - 99 mg/dL  Hemoglobin and hematocrit, blood     Status: Abnormal   Collection Time: 03/29/23  1:04 PM  Result Value Ref Range   Hemoglobin 11.6 (L) 13.0 - 17.0 g/dL   HCT 16.1 (L) 09.6 - 04.5 %  Hemoglobin A1c     Status: Abnormal   Collection Time: 03/29/23  3:10 PM  Result Value Ref Range   Hgb A1c MFr Bld 6.6 (H) 4.8 - 5.6 %   Mean Plasma Glucose 142.72 mg/dL  Glucose, capillary     Status:  Abnormal   Collection Time: 03/29/23  4:29 PM  Result Value Ref Range   Glucose-Capillary 205 (H) 70 - 99 mg/dL  Glucose, capillary     Status: Abnormal   Collection Time: 03/29/23  9:23 PM  Result Value Ref Range   Glucose-Capillary 142 (H) 70 - 99 mg/dL  Glucose, capillary     Status: Abnormal   Collection Time: 03/30/23  7:35 AM  Result Value Ref Range   Glucose-Capillary 123 (H) 70 - 99 mg/dL  Basic metabolic panel     Status: Abnormal   Collection Time: 03/30/23  8:17 AM  Result Value Ref Range   Sodium 139 135 - 145 mmol/L   Potassium 3.7 3.5 - 5.1 mmol/L   Chloride 105 98 - 111 mmol/L   CO2 24 22 - 32 mmol/L   Glucose, Bld 132 (H) 70 - 99 mg/dL   BUN 22 (H) 6 - 20 mg/dL   Creatinine, Ser 4.09 (H) 0.61 - 1.24 mg/dL   Calcium 8.8 (L) 8.9 - 10.3 mg/dL   GFR, Estimated 52 (L) >60 mL/min   Anion gap 10 5 - 15  Hemoglobin and hematocrit, blood     Status: Abnormal   Collection Time: 03/30/23  8:17 AM  Result Value Ref Range   Hemoglobin 11.1 (L) 13.0 - 17.0 g/dL   HCT 81.1 (  L) 39.0 - 52.0 %    Assessment/Plan: POD# 1 s/p robotic cystoprostatectomy.  Mild AKI.  1) Ambulate, Incentive spirometry 2) Sips and chips 3) Labs in AM 4) Continue IV hydration, avoid nephrotoxins   Vanna Scotland, MD   LOS: 2 days   Vanna Scotland 03/30/2023, 11:15 AM

## 2023-03-31 LAB — BASIC METABOLIC PANEL
Anion gap: 9 (ref 5–15)
BUN: 18 mg/dL (ref 6–20)
CO2: 24 mmol/L (ref 22–32)
Calcium: 8.5 mg/dL — ABNORMAL LOW (ref 8.9–10.3)
Chloride: 105 mmol/L (ref 98–111)
Creatinine, Ser: 1.2 mg/dL (ref 0.61–1.24)
GFR, Estimated: >60 mL/min (ref >60)
Glucose, Bld: 141 mg/dL — ABNORMAL HIGH (ref 70–99)
Potassium: 3.6 mmol/L (ref 3.5–5.1)
Sodium: 138 mmol/L (ref 135–145)

## 2023-03-31 LAB — GLUCOSE, CAPILLARY
Glucose-Capillary: 134 mg/dL — ABNORMAL HIGH (ref 70–99)
Glucose-Capillary: 155 mg/dL — ABNORMAL HIGH (ref 70–99)
Glucose-Capillary: 138 mg/dL — ABNORMAL HIGH (ref 70–99)
Glucose-Capillary: 138 mg/dL — ABNORMAL HIGH (ref 70–99)

## 2023-03-31 LAB — HEMOGLOBIN AND HEMATOCRIT, BLOOD
HCT: 34.6 % — ABNORMAL LOW (ref 39.0–52.0)
Hemoglobin: 11.5 g/dL — ABNORMAL LOW (ref 13.0–17.0)

## 2023-03-31 NOTE — Progress Notes (Signed)
 Pt wife will be at bedside until 11am tomorrow. If wound care is to come tomorrow, if it is possible for them to come before 11am to review teaching.

## 2023-03-31 NOTE — Progress Notes (Signed)
 2 Days Post-Op Subjective: The patient is doing well.  No nausea or vomiting. Pain is adequately controlled.  Ambulating.  Tolerating sips and chips.  Small flatus but no BM.  Anxious for coffee this AM.  Objective: Vital signs in last 24 hours: Temp:  [97.5 F (36.4 C)-99 F (37.2 C)] 99 F (37.2 C) (03/02 0525) Pulse Rate:  [81-93] 93 (03/02 0525) Resp:  [16-20] 16 (03/02 0525) BP: (127-147)/(80-97) 141/97 (03/02 1013) SpO2:  [97 %-100 %] 100 % (03/02 0525)  Intake/Output from previous day: 03/01 0701 - 03/02 0700 In: 950 [I.V.:750; IV Piggyback:200] Out: 1625 [Urine:1050; Drains:575] Intake/Output this shift: Total I/O In: -  Out: 1070 [Urine:900; Drains:170]  Physical Exam:  General: Alert and oriented. Lungs: Normal work of breathing GI: Soft, Nondistended.  Wounds c/d/I.  Soma pink / health draining urine with stents in place.  JP with scan serosanguinous fluid.   Incisions: Clean and dry. Extremities: Nontender, no erythema, no edema.  Lab Results: Recent Labs    03/29/23 1304 03/30/23 0817 03/31/23 0810  HGB 11.6* 11.1* 11.5*  HCT 34.9* 34.1* 34.6*          Recent Labs    03/28/23 1837 03/30/23 0817 03/31/23 0810  CREATININE 1.03 1.53* 1.20    Assessment/Plan: POD# 2 s/p robotic cystoprostatectomy.  Cr improving.  1) Ambulate, Incentive spirometry 2) Advance diet to clears 3) Labs in AM 4) Cr improved, will d/c fluids as long as his is drinking adequate fluids   Vanna Scotland, MD   LOS: 3 days   Vanna Scotland 03/31/2023, 10:25 AM

## 2023-04-01 LAB — GLUCOSE, CAPILLARY
Glucose-Capillary: 165 mg/dL — ABNORMAL HIGH (ref 70–99)
Glucose-Capillary: 122 mg/dL — ABNORMAL HIGH (ref 70–99)
Glucose-Capillary: 98 mg/dL (ref 70–99)
Glucose-Capillary: 123 mg/dL — ABNORMAL HIGH (ref 70–99)

## 2023-04-01 LAB — BASIC METABOLIC PANEL
Anion gap: 7 (ref 5–15)
BUN: 15 mg/dL (ref 6–20)
CO2: 24 mmol/L (ref 22–32)
Calcium: 8.5 mg/dL — ABNORMAL LOW (ref 8.9–10.3)
Chloride: 104 mmol/L (ref 98–111)
Creatinine, Ser: 1.18 mg/dL (ref 0.61–1.24)
GFR, Estimated: >60 mL/min (ref >60)
Glucose, Bld: 121 mg/dL — ABNORMAL HIGH (ref 70–99)
Potassium: 3.4 mmol/L — ABNORMAL LOW (ref 3.5–5.1)
Sodium: 135 mmol/L (ref 135–145)

## 2023-04-01 LAB — HEMOGLOBIN AND HEMATOCRIT, BLOOD
HCT: 34.3 % — ABNORMAL LOW (ref 39.0–52.0)
Hemoglobin: 11.4 g/dL — ABNORMAL LOW (ref 13.0–17.0)

## 2023-04-01 MED ORDER — SODIUM CHLORIDE 0.9% FLUSH
10.0000 mL | Freq: Two times a day (BID) | INTRAVENOUS | Status: DC
  Administered 2023-04-01 – 2023-04-02 (×2): 20 mL
  Administered 2023-04-02 – 2023-04-03 (×2): 10 mL

## 2023-04-01 MED ORDER — SODIUM CHLORIDE 0.9% FLUSH
10.0000 mL | INTRAVENOUS | Status: DC | PRN
  Administered 2023-04-03: 10 mL

## 2023-04-01 MED ORDER — CHLORHEXIDINE GLUCONATE CLOTH 2 % EX PADS
6.0000 | MEDICATED_PAD | Freq: Every day | CUTANEOUS | Status: DC
  Administered 2023-04-01 – 2023-04-03 (×3): 6 via TOPICAL

## 2023-04-01 NOTE — Progress Notes (Signed)
 3 Days Post-Op   Subjective/Chief Complaint:  1 - Bladder Cancer - s/p robotic cystoprostatectomy with ICG senitnal + template node dissection and conduit diversion on 03/29/23. Path pending. Admitted 2/27 for bowel prep and stomal marking.  2 - Ileus / Bowel Function  - small bowel anastamsis as part of urinary diversion 2/28. Received entered. NPO initiallyi. Ice chips POD 1, CLears POD2. REsumed bowel function and flatus POD 2. Fulls POD 3.   3 - Disposition / Rehab - independent in all ADL's at baseline. Ambulating w/o assitance POD 3. Ostomy RN team working with in house for new urostomy teaching.  Today "Brad [name]" is progressive rapidly. BM and flatus yesterday, tollerated clears w/o emewis. Walking in halls w/o walker.    Objective: Vital signs in last 24 hours: Temp:  [97.5 F (36.4 C)-98.8 F (37.1 C)] 97.5 F (36.4 C) (03/03 0530) Pulse Rate:  [79-95] 79 (03/03 0530) Resp:  [12-18] 18 (03/03 0530) BP: (123-147)/(89-97) 123/89 (03/03 0530) SpO2:  [99 %] 99 % (03/03 0530) Last BM Date : 03/29/23  Intake/Output from previous day: 03/02 0701 - 03/03 0700 In: 720 [P.O.:720] Out: 3320 [Urine:2475; Drains:845] Intake/Output this shift: No intake/output data recorded.  NAD, family at bedside in good spirits. Non-labored breathing on RA SNTND. RLQ Urostomy pink / patent with Rt (red) and Lt (blue) bander stens and copious non-foul urine JP with non-foul serosanguinous output No c/c/e  Lab Results:  Recent Labs    03/30/23 0817 03/31/23 0810  HGB 11.1* 11.5*  HCT 34.1* 34.6*   BMET Recent Labs    03/30/23 0817 03/31/23 0810  NA 139 138  K 3.7 3.6  CL 105 105  CO2 24 24  GLUCOSE 132* 141*  BUN 22* 18  CREATININE 1.53* 1.20  CALCIUM 8.8* 8.5*   PT/INR No results for input(s): "LABPROT", "INR" in the last 72 hours. ABG No results for input(s): "PHART", "HCO3" in the last 72 hours.  Invalid input(s): "PCO2", "PO2"  Studies/Results: No results  found.  Anti-infectives: Anti-infectives (From admission, onward)    Start     Dose/Rate Route Frequency Ordered Stop   03/29/23 1545  piperacillin-tazobactam (ZOSYN) IVPB 3.375 g  Status:  Discontinued        3.375 g 100 mL/hr over 30 Minutes Intravenous Every 8 hours 03/29/23 1449 03/29/23 1453   03/29/23 1545  piperacillin-tazobactam (ZOSYN) IVPB 3.375 g        3.375 g 12.5 mL/hr over 240 Minutes Intravenous Every 8 hours 03/29/23 1452 03/30/23 1040   03/29/23 0600  piperacillin-tazobactam (ZOSYN) IVPB 3.375 g        3.375 g 100 mL/hr over 30 Minutes Intravenous  Once 03/28/23 1936 03/29/23 0755   03/28/23 2100  neomycin (MYCIFRADIN) tablet 500 mg        500 mg Oral Every 4 hours 03/28/23 1936 03/29/23 0010   03/28/23 2100  metroNIDAZOLE (FLAGYL) tablet 500 mg        500 mg Oral Every 4 hours 03/28/23 1936 03/29/23 0010       Assessment/Plan: Doing very well POD 3 s/p cystectomy. Advance to fulls, continue ambulation. Appreciate ostomy-RN team education and support. Goals for DC discussed.    Brad [name]. 04/01/2023

## 2023-04-01 NOTE — Progress Notes (Signed)
 Pt had a bowel movement, he stated it was mostly liquid and also stated he is passing gas.

## 2023-04-01 NOTE — Consult Note (Addendum)
 WOC Nurse ostomy consult note Wife at bedside for teaching session.  She asked appropriate questions and watched the procedure.  Stoma is red and viable, 1 1/2 inches, flush with skin level.  2 stints in place. Mod amt blood-tinged urine in the pouch.  Previous flat pouch was leaking behind the barrier.  Placed in a 2 piece convex pouch with a barrier this time to attempt to avoid leaking.  Pt assisted using a hand-held mirror. He was able to stretch and apply the barrier ring and barrier and snap the 2 piece pouch together.  He could open and close to empty and attach and disconnect the bedside drainage bag.  Discussed pouching routines and ordering supplies.  Pt is unsure if he will use one piece or 2 piece.  Provided samples of each type and demonstrated the differences.  Ordered 5 sets of supplies to the room for staff nurses use and educational materials left at the bedside.  Use supplies: Use Supplies: barrier ring, Lawson # [id number], convex pouch Lawson # [id number] and convex wafer # [id number] and urostomy Gigi Gin # [phone] Enrolled patient in [facility] DC program: Yes, today. WOC team will plan to perform another teaching session on Wed.  Pt could benefit from Home health assistance after discharge, please order if desired.  Thank-you,  Cammie Mcgee MSN, RN, CWOCN, [place], CNS [phone]

## 2023-04-02 LAB — POCT I-STAT 7, (LYTES, BLD GAS, ICA,H+H)
Acid-base deficit: 1 mmol/L (ref 0.0–2.0)
Bicarbonate: 24.8 mmol/L (ref 20.0–28.0)
Calcium, Ion: 1.27 mmol/L (ref 1.15–1.40)
HCT: 31 % — ABNORMAL LOW (ref 39.0–52.0)
Hemoglobin: 10.5 g/dL — ABNORMAL LOW (ref 13.0–17.0)
O2 Saturation: 99 %
Potassium: 3.5 mmol/L (ref 3.5–5.1)
Sodium: 142 mmol/L (ref 135–145)
TCO2: 26 mmol/L (ref 22–32)
pCO2 arterial: 45.9 mmHg (ref 32–48)
pH, Arterial: 7.34 — ABNORMAL LOW (ref 7.35–7.45)
pO2, Arterial: 166 mmHg — ABNORMAL HIGH (ref 83–108)

## 2023-04-02 LAB — BASIC METABOLIC PANEL
Anion gap: 10 (ref 5–15)
BUN: 15 mg/dL (ref 6–20)
CO2: 24 mmol/L (ref 22–32)
Calcium: 8.5 mg/dL — ABNORMAL LOW (ref 8.9–10.3)
Chloride: 100 mmol/L (ref 98–111)
Creatinine, Ser: 1.27 mg/dL — ABNORMAL HIGH (ref 0.61–1.24)
GFR, Estimated: >60 mL/min (ref >60)
Glucose, Bld: 133 mg/dL — ABNORMAL HIGH (ref 70–99)
Potassium: 3.4 mmol/L — ABNORMAL LOW (ref 3.5–5.1)
Sodium: 134 mmol/L — ABNORMAL LOW (ref 135–145)

## 2023-04-02 LAB — HEMOGLOBIN AND HEMATOCRIT, BLOOD
HCT: 37.4 % — ABNORMAL LOW (ref 39.0–52.0)
Hemoglobin: 12 g/dL — ABNORMAL LOW (ref 13.0–17.0)

## 2023-04-02 LAB — GLUCOSE, CAPILLARY
Glucose-Capillary: 133 mg/dL — ABNORMAL HIGH (ref 70–99)
Glucose-Capillary: 122 mg/dL — ABNORMAL HIGH (ref 70–99)
Glucose-Capillary: 134 mg/dL — ABNORMAL HIGH (ref 70–99)
Glucose-Capillary: 133 mg/dL — ABNORMAL HIGH (ref 70–99)

## 2023-04-02 MED ORDER — SENNOSIDES-DOCUSATE SODIUM 8.6-50 MG PO TABS
2.0000 | ORAL_TABLET | Freq: Every evening | ORAL | Status: DC | PRN
  Administered 2023-04-02: 2 via ORAL
  Filled 2023-04-02: qty 2

## 2023-04-02 NOTE — Progress Notes (Signed)
 4 Days Post-Op   Subjective/Chief Complaint:  1 - Bladder Cancer - s/p robotic cystoprostatectomy with ICG senitnal + template node dissection and conduit diversion on 03/29/23. Path pending. Admitted 2/27 for bowel prep and stomal marking.  2 - Ileus / Bowel Function  - small bowel anastamsis as part of urinary diversion 2/28. Received entered. NPO initiallyi. Ice chips POD 1, CLears POD2. REsumed bowel function and flatus POD 2. Fulls POD 3. Carb modified POD 4.   3 - Disposition / Rehab - independent in all ADL's at baseline. Ambulating w/o assitance POD 3. Ostomy RN team working with in house for new urostomy teaching.  Today "Adonus" is continuing to progress. Contineus to have bowel function and tolerate PO. Had productive teachign visit with ostomy RN team yesterday. Remains in good spirits.    Objective: Vital signs in last 24 hours: Temp:  [97.7 F (36.5 C)-99.1 F (37.3 C)] 97.7 F (36.5 C) (03/04 0510) Pulse Rate:  [83-85] 83 (03/04 0510) Resp:  [17-20] 20 (03/04 0510) BP: (126-152)/(78-99) 126/78 (03/04 0510) SpO2:  [97 %-100 %] 100 % (03/04 0510) Last BM Date : 04/02/23  Intake/Output from previous day: 03/03 0701 - 03/04 0700 In: 720 [P.O.:720] Out: 1000 [Urine:450; Drains:550] Intake/Output this shift: Total I/O In: -  Out: 60 [Drains:60]   NAD, family at bedside in good spirits. Non-labored breathing on RA SNTND. RLQ Urostomy pink / patent with Rt (red) and Lt (blue) bander stens and copious non-foul urine JP with scant non-foul serosanguinous output No c/c/e  Lab Results:  Recent Labs    04/01/23 1429 04/02/23 0459  HGB 11.4* 12.0*  HCT 34.3* 37.4*   BMET Recent Labs    04/01/23 1721 04/02/23 0459  NA 135 134*  K 3.4* 3.4*  CL 104 100  CO2 24 24  GLUCOSE 121* 133*  BUN 15 15  CREATININE 1.18 1.27*  CALCIUM 8.5* 8.5*   PT/INR No results for input(s): "LABPROT", "INR" in the last 72 hours. ABG No results for input(s): "PHART", "HCO3" in  the last 72 hours.  Invalid input(s): "PCO2", "PO2"  Studies/Results: No results found.  Anti-infectives: Anti-infectives (From admission, onward)    Start     Dose/Rate Route Frequency Ordered Stop   03/29/23 1545  piperacillin-tazobactam (ZOSYN) IVPB 3.375 g  Status:  Discontinued        3.375 g 100 mL/hr over 30 Minutes Intravenous Every 8 hours 03/29/23 1449 03/29/23 1453   03/29/23 1545  piperacillin-tazobactam (ZOSYN) IVPB 3.375 g        3.375 g 12.5 mL/hr over 240 Minutes Intravenous Every 8 hours 03/29/23 1452 03/30/23 1040   03/29/23 0600  piperacillin-tazobactam (ZOSYN) IVPB 3.375 g        3.375 g 100 mL/hr over 30 Minutes Intravenous  Once 03/28/23 1936 03/29/23 0755   03/28/23 2100  neomycin (MYCIFRADIN) tablet 500 mg        500 mg Oral Every 4 hours 03/28/23 1936 03/29/23 0010   03/28/23 2100  metroNIDAZOLE (FLAGYL) tablet 500 mg        500 mg Oral Every 4 hours 03/28/23 1936 03/29/23 0010       Assessment/Plan:  Doing exceptionally well POD 5. Goals for DC discussed. Possibly tomorrow based on current progress. Carb modified diate today, will reqeust HHRN if possible.    [name]. 04/02/2023

## 2023-04-02 NOTE — TOC Initial Note (Signed)
 Transition of Care [facility]) - Initial/Assessment Note   Patient Details  Name: Brad [name] MRN: [id number] Date of Birth: 1960/06/19  Transition of Care [facility]) CM/SW Contact:    Ewing Schlein, LCSW Phone Number: 04/02/2023, 3:08 PM  Clinical Narrative: [facility] consulted for [facility] for a new urostomy. CSW spoke with patient regarding [facility] referral and patient has no agency preference. CSW made [facility] referral to Cindie with [facility], which was accepted. CSW updated patient.  Expected Discharge Plan: Home w Home Health Services Barriers to Discharge: Continued Medical Work up  Patient Goals and CMS Choice Patient states their goals for this hospitalization and ongoing recovery are:: Get [facility] CMS Medicare.gov Compare Post Acute Care list provided to:: Patient Choice offered to / list presented to : Patient  Expected Discharge Plan and Services In-house Referral: Clinical Social Work Post Acute Care Choice: Home Health Living arrangements for the past 2 months: Single Family Home           DME Arranged: N/A DME Agency: NA HH Arranged: RN HH AgencyHotel manager Home Health Care Date [facility] Agency Contacted: 04/02/23 Time HH Agency Contacted: 1445 Representative spoke with at [facility] Agency: Cindie  Prior Living Arrangements/Services Living arrangements for the past 2 months: Single Family Home Lives with:: Spouse Patient language and need for interpreter reviewed:: Yes Do you feel safe going back to the place where you live?: Yes      Need for Family Participation in Patient Care: No (Comment) Care giver support system in place?: Yes (comment) Criminal Activity/Legal Involvement Pertinent to Current Situation/Hospitalization: No - Comment as needed  Activities of Daily Living ADL Screening (condition at time of admission) Independently performs ADLs?: Yes (appropriate for developmental age) Is the patient deaf or have difficulty hearing?: No Does the patient have difficulty seeing, even when wearing  glasses/contacts?: No Does the patient have difficulty concentrating, remembering, or making decisions?: No  Permission Sought/Granted Permission granted to share information with : Yes, Verbal Permission Granted Permission granted to share info w AGENCY: HH agencies  Emotional Assessment Attitude/Demeanor/Rapport: Engaged Affect (typically observed): Accepting Orientation: : Oriented to Self, Oriented to Place, Oriented to  Time, Oriented to Situation Alcohol / Substance Use: Not Applicable Psych Involvement: No (comment)  Admission diagnosis:  Bladder cancer [facility]) [C67.9] Patient Active Problem List   Diagnosis Date Noted   Surgical counseling visit 03/25/2023   Thrombocytopenia (HCC) 01/03/2023   Hypomagnesemia 01/03/2023   Hyperglycemia 01/03/2023   Normocytic anemia 11/22/2022   At risk for side effect of medication 11/21/2022   Bladder cancer (HCC) 09/11/2022   Vitamin D deficiency 06/10/2020   Type 2 diabetes mellitus (HCC) 10/01/2014   OSA (obstructive sleep apnea) 09/27/2014   Left-sided weakness 09/27/2014   History of stroke 08/13/2014   Former smoker 08/13/2014   Erectile dysfunction 08/13/2014   Obesity 06/20/2014   Essential hypertension 05/20/2014   NSTEMI (non-ST elevated myocardial infarction) (HCC) 05/08/2014   PCP:  Levin Erp, MD Pharmacy:   [facility] [phone] - [place], [place] - [address] AT [facility] OF [address] & [address] [place]  [address] Phone: [phone] Fax: [phone]  Social Drivers of Health (SDOH) Social History: SDOH Screenings   Food Insecurity: No Food Insecurity (03/28/2023)  Housing: Low Risk  (03/28/2023)  Transportation Needs: No Transportation Needs (03/28/2023)  Utilities: Not At Risk (03/28/2023)  Alcohol Screen: Low Risk  (07/30/2022)  Depression (PHQ2-9): Low Risk  (09/18/2022)  Financial Resource Strain: Low Risk  (07/30/2022)  Physical Activity: Insufficiently Active  (07/30/2022)  Social Connections: Moderately Integrated (07/30/2022)  Stress: No Stress Concern Present (07/30/2022)  Tobacco Use: Medium Risk (03/29/2023)   SDOH Interventions:    Readmission Risk Interventions     No data to display

## 2023-04-03 LAB — SURGICAL PATHOLOGY

## 2023-04-03 LAB — GLUCOSE, CAPILLARY
Glucose-Capillary: 122 mg/dL — ABNORMAL HIGH (ref 70–99)
Glucose-Capillary: 185 mg/dL — ABNORMAL HIGH (ref 70–99)
Glucose-Capillary: 109 mg/dL — ABNORMAL HIGH (ref 70–99)

## 2023-04-03 LAB — BASIC METABOLIC PANEL
Anion gap: 8 (ref 5–15)
BUN: 16 mg/dL (ref 6–20)
CO2: 25 mmol/L (ref 22–32)
Calcium: 8.1 mg/dL — ABNORMAL LOW (ref 8.9–10.3)
Chloride: 102 mmol/L (ref 98–111)
Creatinine, Ser: 1.15 mg/dL (ref 0.61–1.24)
GFR, Estimated: >60 mL/min (ref >60)
Glucose, Bld: 114 mg/dL — ABNORMAL HIGH (ref 70–99)
Potassium: 3.3 mmol/L — ABNORMAL LOW (ref 3.5–5.1)
Sodium: 135 mmol/L (ref 135–145)

## 2023-04-03 LAB — CREATININE, FLUID (PLEURAL, PERITONEAL, JP DRAINAGE): Creat, Fluid: 1.2 mg/dL

## 2023-04-03 LAB — HEMOGLOBIN AND HEMATOCRIT, BLOOD
HCT: 31.8 % — ABNORMAL LOW (ref 39.0–52.0)
Hemoglobin: 10.9 g/dL — ABNORMAL LOW (ref 13.0–17.0)

## 2023-04-03 MED ORDER — OXYCODONE-ACETAMINOPHEN 5-325 MG PO TABS: 1.0000 | ORAL_TABLET | Freq: Four times a day (QID) | ORAL | 0 refills | Status: DC | PRN

## 2023-04-03 MED ORDER — HEPARIN SOD (PORK) LOCK FLUSH 100 UNIT/ML IV SOLN
500.0000 [IU] | INTRAVENOUS | Status: AC | PRN
  Administered 2023-04-03: 500 [IU]
  Filled 2023-04-03: qty 5

## 2023-04-03 NOTE — Consult Note (Signed)
 WOC Nurse ostomy follow up Stoma type/location: RLQ. Ileal conduit Stomal assessment/size: pink, moist, red and blue stents in placed. 1 1/2 round, flush with skin  Peristomal assessment: intact  Treatment options for stomal/peristomal skin: 2" barrier ring  Output yellow urine Ostomy pouching: 2pc 2 1/4 pouch with 2" skin barrier  Education provided:  Met with patient and his wife.  She desires for patient to be independent  Today when I arrived patient's wife has taken the 2pc convex pouches home, these are not readily available on the unit. Therefore I used 2pc flat 2 1/4" urostomy system with barrier ring Had patient and wife perform pouch change, they do need encouragement. Assisted them with stabilizing stents with pouch removal.  Had patient's wife cut new skin barrier, patient assisted with placement of barrier ring. Wife placed new skin barrier and patient and wife worked Lawyer to attach pouch to the skin barrier. Patient is independent with opening and closing spout. He reports he is knowledgeable about hooking to nighttime drainage.  I provided patient with an additional 4 convex skin barriers, 4 barrier rings, and 4 urostomy pouches. She has taken educational materials home.  I have marked an Engineer, site with the items they are using. He is scheduled to have HHRN I have suggested that they call and schedule an outpatient ostomy clinic appt. They have been to the clinic preop for marking.  Enrolled patient in [place] Secure Start Discharge program: Yes  WOC Nurse will follow along with you for continued support with ostomy teaching and care Tharon Bomar [employer] [facility] MSN, RN, [place], CNS, [place] [phone]

## 2023-04-03 NOTE — Discharge Summary (Signed)
 Physician Discharge Summary  Patient ID: Brad [name] MRN: [id number] DOB/AGE: 1961/01/05 63 y.o.  Admit date: 03/28/2023 Discharge date: 04/03/2023  Admission Diagnoses: Bladder Cancer  Discharge Diagnoses:  Principal Problem:   Bladder cancer [facility])   Discharged Condition: good  Hospital Course:   1 - Bladder Cancer - s/p robotic cystoprostatectomy with ICG senitnal + template node dissection and conduit diversion on 03/29/23. Path pending at discharge. Admitted 2/27 for bowel prep and stomal marking. JP removed 3/5 as Cr same as serum.   2 - Ileus / Bowel Function  - small bowel anastamsis as part of urinary diversion 2/28. Received entered. NPO initiallyi. Ice chips POD 1, CLears POD2. REsumed bowel function and flatus POD 2. Fulls POD 3. Carb modified POD 4 and therafter.   3 - Disposition / Rehab - independent in all ADL's at baseline. Ambulating w/o assitance POD 3. Ostomy RN team working with in house for new urostomy teaching. Arranged for home health with Frances Furbish at discharge for new urostomy teaching.  By the afternoon of 3/5, the day of discharge, Brad [name] is ambulatory, pain controlled on PO meds, maintianing PO nutrition with resumed bowel function, and felt to be adequate for discharge. Hgb 10.9, Cr 1.1, path pending at discharge.    Consults:  case management, ostomy RN team  Significant Diagnostic Studies: labs: as per above  Treatments: surgery: as per above  Discharge Exam: Blood pressure 127/79, pulse 80, temperature 98.1 F (36.7 C), temperature source Oral, resp. rate 20, height 6\' 9"  (2.057 m), weight 133.4 kg, SpO2 99%.  NAD, wife at bedside,  in good spirits. Non-labored breathing on RA SNTND. RLQ Urostomy pink / patent with Rt (red) and Lt (blue) bander stens and copious non-foul urine JP with scant non-foul serosanguinous output, removed and dry dressing placed.  No c/c/e  Disposition: HOME with home health   Allergies as of 04/03/2023   No Known  Allergies      Medication List     STOP taking these medications    BC FAST PAIN RELIEF PO   Mounjaro 12.5 MG/0.5ML Pen Generic drug: tirzepatide       TAKE these medications    acetaminophen 500 MG tablet Commonly known as: TYLENOL Take 500 mg by mouth every 6 (six) hours as needed for moderate pain (pain score 4-6).   amLODipine 10 MG tablet Commonly known as: NORVASC Take 1 tablet (10 mg total) by mouth daily.   blood glucose meter kit and supplies Kit Dispense based on patient and insurance preference. Check fasting blood sugar once daily ICD10 R73.03   chlorthalidone 25 MG tablet Commonly known as: HYGROTON Take 1 tablet (25 mg total) by mouth daily. What changed:  when to take this reasons to take this   gabapentin 100 MG capsule Commonly known as: Neurontin Take 1 capsule (100 mg total) by mouth at bedtime for 3 days, THEN 2 capsules (200 mg total) at bedtime. Start taking on: March 21, 2023 What changed: See the new instructions.   glucose blood test strip Use as instructed to monitor FSBS 1x daily. Dx: R73.09   ibuprofen 800 MG tablet Commonly known as: ADVIL Take 400 mg by mouth every 6 (six) hours as needed.   labetalol 200 MG tablet Commonly known as: NORMODYNE TAKE 1 TABLET(200 MG) BY MOUTH TWICE DAILY   losartan 100 MG tablet Commonly known as: COZAAR Take 1 tablet (100 mg total) by mouth daily.   methocarbamol 500 MG tablet Commonly known as: ROBAXIN Take  1 tablet (500 mg total) by mouth 2 (two) times daily. What changed:  when to take this reasons to take this   One-A-Day Mens 50+ Advantage Tabs Take 1 tablet by mouth daily.   onetouch ultrasoft lancets Use as instructed   oxyCODONE-acetaminophen 5-325 MG tablet Commonly known as: PERCOCET/ROXICET Take 1 tablet by mouth every 6 (six) hours as needed for severe pain (pain score 7-10) (post-operatively).   tadalafil 20 MG tablet Commonly known as: CIALIS TAKE 1 TABLET(20  MG) BY MOUTH DAILY AS NEEDED   Vitamin D3 Maximum Strength 125 MCG (5000 UT) capsule Generic drug: Cholecalciferol Take 5,000 Units by mouth daily.        Follow-up Information     Brad [name]., MD Follow up on 04/15/2023.   Specialty: Urology Why: at 1:30PM for MD visit Contact information: [address] [place] [address] [phone]         Care, [facility] Follow up.   Specialty: Home Health Services Why: Frances Furbish will provide nursing in the home for the new urostomy after discharge. Contact information: [address] [place] [address] [phone]                 Signed: [name]. 04/03/2023, 4:00 PM

## 2023-04-08 ENCOUNTER — Ambulatory Visit (HOSPITAL_COMMUNITY): Admitting: Nurse Practitioner

## 2023-04-09 ENCOUNTER — Ambulatory Visit (HOSPITAL_COMMUNITY)
Admission: RE | Admit: 2023-04-09 | Discharge: 2023-04-09 | Disposition: A | Discharge status: Home / Self Care | Source: Ambulatory Visit | Attending: Plastic Surgery | Admitting: Plastic Surgery

## 2023-04-09 DIAGNOSIS — L24B3 Irritant contact dermatitis related to fecal or urinary stoma or fistula: Secondary | ICD-10-CM

## 2023-04-09 DIAGNOSIS — Z432 Encounter for attention to ileostomy: Secondary | ICD-10-CM | POA: Diagnosis not present

## 2023-04-09 DIAGNOSIS — C679 Malignant neoplasm of bladder, unspecified: Secondary | ICD-10-CM | POA: Insufficient documentation

## 2023-04-09 DIAGNOSIS — Z436 Encounter for attention to other artificial openings of urinary tract: Secondary | ICD-10-CM

## 2023-04-09 DIAGNOSIS — Z932 Ileostomy status: Secondary | ICD-10-CM | POA: Diagnosis present

## 2023-04-09 NOTE — Discharge Instructions (Signed)
 1 piece convex Barrier ring Stoma powder and skin prep Barrier strips to outside Ostomy belt Ask [facility] about drainage bag extension tubing

## 2023-04-09 NOTE — Progress Notes (Signed)
 [facility]   Reason for visit:  RUQ ileal conduit HPI:  Bladder cancer with cystoproctatecomy Past Medical History:  Diagnosis Date  . Abnormal ultrasound of carotid artery 04/2014   no significant obstruction  . Ataxia 04/2014  . Cancer (HCC)   . Diabetes (HCC)   . EKG abnormalities 04/2014   poor R wave progression, NSR  . Former smoker    25 years x 1.5 ppd, stopped 04/2014  . Gait disturbance 04/2014   s/p stroke  . H/O echocardiogram 04/2014   moderate LVH, 60-65% EF, no valve disease  . History of heart attack   . Hypertension   . Hypertensive heart disease 04/2014  . Intraparenchymal hemorrhage of brain [facility]) 05/05/2014   hospitalization [facility]  . Myocardial infarction [facility])   . Obesity   . Prediabetes   . Short-term memory loss 04/2014  . Sleep apnea   . Stroke [facility]) 04/2014   Family History  Problem Relation Age of Onset  . Heart disease Mother   . Hypertension Mother   . Sudden death Mother   . Hypertension Brother   . Aneurysm Paternal Grandmother    No Known Allergies Current Outpatient Medications  Medication Sig Dispense Refill Last Dose/Taking  . acetaminophen (TYLENOL) 500 MG tablet Take 500 mg by mouth every 6 (six) hours as needed for moderate pain (pain score 4-6).     Marland Kitchen amLODipine (NORVASC) 10 MG tablet Take 1 tablet (10 mg total) by mouth daily. 90 tablet 3   . blood glucose meter kit and supplies KIT Dispense based on patient and insurance preference. Check fasting blood sugar once daily ICD10 R73.03 1 each 0   . chlorthalidone (HYGROTON) 25 MG tablet Take 1 tablet (25 mg total) by mouth daily. (Patient taking differently: Take 25 mg by mouth daily as needed (fluid retention).) 30 tablet 2   . Cholecalciferol (VITAMIN D3 MAXIMUM STRENGTH) 125 MCG (5000 UT) capsule Take 5,000 Units by mouth daily.     Marland Kitchen gabapentin (NEURONTIN) 100 MG capsule Take 1 capsule (100 mg total) by mouth at bedtime for 3 days, THEN 2  capsules (200 mg total) at bedtime. (Patient taking differently: Take 100 mg at bedtime) 63 capsule 0   . glucose blood test strip Use as instructed to monitor FSBS 1x daily. Dx: R73.09 100 each 12   . ibuprofen (ADVIL) 800 MG tablet Take 400 mg by mouth every 6 (six) hours as needed.     . labetalol (NORMODYNE) 200 MG tablet TAKE 1 TABLET(200 MG) BY MOUTH TWICE DAILY 180 tablet 3   . Lancets (ONETOUCH ULTRASOFT) lancets Use as instructed 100 each 12   . losartan (COZAAR) 100 MG tablet Take 1 tablet (100 mg total) by mouth daily. 90 tablet 3   . methocarbamol (ROBAXIN) 500 MG tablet Take 1 tablet (500 mg total) by mouth 2 (two) times daily. (Patient taking differently: Take 500 mg by mouth 2 (two) times daily as needed for muscle spasms.) 20 tablet 0   . Multiple Vitamins-Minerals (ONE-A-DAY MENS 50+ ADVANTAGE) TABS Take 1 tablet by mouth daily.     Marland Kitchen oxyCODONE-acetaminophen (PERCOCET/ROXICET) 5-325 MG tablet Take 1 tablet by mouth every 6 (six) hours as needed for severe pain (pain score 7-10) (post-operatively). 10 tablet 0   . tadalafil (CIALIS) 20 MG tablet TAKE 1 TABLET(20 MG) BY MOUTH DAILY AS NEEDED 30 tablet 3    No current facility-administered medications for this encounter.   ROS  Review of  Systems  Constitutional:  Positive for fatigue.  Gastrointestinal:        RUQ ileal conduit   Skin:  Positive for color change.  Psychiatric/Behavioral: Negative.    All other systems reviewed and are negative. Vital signs:  BP 132/70   Pulse 83   Temp 97.7 F (36.5 C) (Oral)   Resp 20   SpO2 97%  Exam:  Physical Exam Vitals reviewed.  Constitutional:      Appearance: Normal appearance.  Cardiovascular:     Rate and Rhythm: Normal rate and regular rhythm.     Pulses: Normal pulses.     Heart sounds: Normal heart sounds.  Pulmonary:     Breath sounds: Normal breath sounds.  Abdominal:     Palpations: Abdomen is soft.  Genitourinary:    Comments: HX bladder cancer   Musculoskeletal:        General: Normal range of motion.  Skin:    General: Skin is warm and dry.     Findings: Erythema present.  Neurological:     Mental Status: He is alert and oriented to person, place, and time. Mental status is at baseline.  Psychiatric:        Mood and Affect: Mood normal.        Behavior: Behavior normal.    Stoma type/location:  RUQ ileal conduit Stomal assessment/size:  1" pink and moist  stents in place Peristomal assessment:  intact Treatment options for stomal/peristomal skin: stoma powder skin prep barrier ring 1 piece convex with ostomy belt Output: clear yellow urine Ostomy pouching: 1pc. Convex with belt Education provided:  perform pouch change with patient.  He is increasing in independence managing pouch care.    Applied barrier strips to pouch perimeter to promote seal    Impression/dx  Ileal conduit  Discussion  Pouching steps, perform pouch change.  Patient gaining independence with ostomy care Plan  Will enroll with [facility].  Requesting tubing extension due to height and size and king size bed. I have requested this with [facility].     Visit time: 55 minutes.   Mike Gip FNP-BC

## 2023-04-11 ENCOUNTER — Other Ambulatory Visit (HOSPITAL_COMMUNITY): Payer: Self-pay | Admitting: Nurse Practitioner

## 2023-04-11 DIAGNOSIS — L24B3 Irritant contact dermatitis related to fecal or urinary stoma or fistula: Secondary | ICD-10-CM

## 2023-04-11 DIAGNOSIS — Z436 Encounter for attention to other artificial openings of urinary tract: Secondary | ICD-10-CM | POA: Insufficient documentation

## 2023-04-11 DIAGNOSIS — N99528 Other complication of other external stoma of urinary tract: Secondary | ICD-10-CM

## 2023-04-23 ENCOUNTER — Ambulatory Visit (HOSPITAL_COMMUNITY): Admitting: Nurse Practitioner

## 2023-04-24 ENCOUNTER — Other Ambulatory Visit: Payer: Self-pay

## 2023-04-24 ENCOUNTER — Encounter (HOSPITAL_COMMUNITY): Payer: Self-pay

## 2023-04-24 ENCOUNTER — Emergency Department (HOSPITAL_COMMUNITY)

## 2023-04-24 ENCOUNTER — Emergency Department (HOSPITAL_COMMUNITY)
Admission: EM | Admit: 2023-04-24 | Discharge: 2023-04-24 | Disposition: A | Discharge status: Home / Self Care | Attending: Emergency Medicine | Admitting: Emergency Medicine

## 2023-04-24 DIAGNOSIS — Z8551 Personal history of malignant neoplasm of bladder: Secondary | ICD-10-CM | POA: Diagnosis not present

## 2023-04-24 DIAGNOSIS — E119 Type 2 diabetes mellitus without complications: Secondary | ICD-10-CM | POA: Insufficient documentation

## 2023-04-24 DIAGNOSIS — R509 Fever, unspecified: Secondary | ICD-10-CM | POA: Diagnosis not present

## 2023-04-24 DIAGNOSIS — R197 Diarrhea, unspecified: Secondary | ICD-10-CM | POA: Diagnosis not present

## 2023-04-24 DIAGNOSIS — R5383 Other fatigue: Secondary | ICD-10-CM | POA: Insufficient documentation

## 2023-04-24 DIAGNOSIS — I10 Essential (primary) hypertension: Secondary | ICD-10-CM | POA: Diagnosis not present

## 2023-04-24 DIAGNOSIS — D72829 Elevated white blood cell count, unspecified: Secondary | ICD-10-CM | POA: Insufficient documentation

## 2023-04-24 LAB — URINALYSIS, ROUTINE W REFLEX MICROSCOPIC
Bilirubin Urine: NEGATIVE
Glucose, UA: NEGATIVE mg/dL
Ketones, ur: NEGATIVE mg/dL
Nitrite: NEGATIVE
Protein, ur: 100 mg/dL — AB
RBC / HPF: >50 RBC/hpf (ref 0–5)
Specific Gravity, Urine: 1.017 (ref 1.005–1.030)
WBC, UA: >50 WBC/hpf (ref 0–5)
pH: 5 (ref 5.0–8.0)

## 2023-04-24 LAB — COMPREHENSIVE METABOLIC PANEL
ALT: 24 U/L (ref 0–44)
AST: 19 U/L (ref 15–41)
Albumin: 3 g/dL — ABNORMAL LOW (ref 3.5–5.0)
Alkaline Phosphatase: 50 U/L (ref 38–126)
Anion gap: 10 (ref 5–15)
BUN: 18 mg/dL (ref 8–23)
CO2: 24 mmol/L (ref 22–32)
Calcium: 9.1 mg/dL (ref 8.9–10.3)
Chloride: 101 mmol/L (ref 98–111)
Creatinine, Ser: 1.61 mg/dL — ABNORMAL HIGH (ref 0.61–1.24)
GFR, Estimated: 48 mL/min — ABNORMAL LOW (ref >60)
Glucose, Bld: 117 mg/dL — ABNORMAL HIGH (ref 70–99)
Potassium: 4 mmol/L (ref 3.5–5.1)
Sodium: 135 mmol/L (ref 135–145)
Total Bilirubin: 1.1 mg/dL (ref 0.0–1.2)
Total Protein: 7.2 g/dL (ref 6.5–8.1)

## 2023-04-24 LAB — RESP PANEL BY RT-PCR (RSV, FLU A&B, COVID)  RVPGX2
Influenza A by PCR: NEGATIVE
Influenza B by PCR: NEGATIVE
Resp Syncytial Virus by PCR: NEGATIVE
SARS Coronavirus 2 by RT PCR: NEGATIVE

## 2023-04-24 LAB — CBC
HCT: 30.8 % — ABNORMAL LOW (ref 39.0–52.0)
Hemoglobin: 10.5 g/dL — ABNORMAL LOW (ref 13.0–17.0)
MCH: 29.5 pg (ref 26.0–34.0)
MCHC: 34.1 g/dL (ref 30.0–36.0)
MCV: 86.5 fL (ref 80.0–100.0)
Platelets: 230 10*3/uL (ref 150–400)
RBC: 3.56 MIL/uL — ABNORMAL LOW (ref 4.22–5.81)
RDW: 12.4 % (ref 11.5–15.5)
WBC: 13.6 10*3/uL — ABNORMAL HIGH (ref 4.0–10.5)
nRBC: 0 % (ref 0.0–0.2)

## 2023-04-24 LAB — LIPASE, BLOOD: Lipase: 19 U/L (ref 11–51)

## 2023-04-24 MED ORDER — IOHEXOL 350 MG/ML SOLN
75.0000 mL | Freq: Once | INTRAVENOUS | Status: AC | PRN
  Administered 2023-04-24: 75 mL via INTRAVENOUS

## 2023-04-24 MED ORDER — SODIUM CHLORIDE 0.9 % IV BOLUS
1000.0000 mL | Freq: Once | INTRAVENOUS | Status: AC
  Administered 2023-04-24: 1000 mL via INTRAVENOUS

## 2023-04-24 MED ORDER — NAPROXEN 250 MG PO TABS
500.0000 mg | ORAL_TABLET | Freq: Once | ORAL | Status: DC
  Filled 2023-04-24: qty 2

## 2023-04-24 MED ORDER — KETOROLAC TROMETHAMINE 15 MG/ML IJ SOLN: 15.0000 mg | Freq: Once | INTRAMUSCULAR | Status: DC

## 2023-04-24 MED ORDER — ACETAMINOPHEN 500 MG PO TABS
1000.0000 mg | ORAL_TABLET | Freq: Once | ORAL | Status: AC
  Administered 2023-04-24: 1000 mg via ORAL
  Filled 2023-04-24: qty 2

## 2023-04-24 NOTE — ED Notes (Signed)
 Patient able to tolerate apple juice and graham cracks with out GI upset.

## 2023-04-24 NOTE — ED Provider Notes (Signed)
 [place] EMERGENCY DEPARTMENT AT [facility] Provider Note   CSN: [id number] Arrival date & time: 04/24/23  1513     History  No chief complaint on file.   Brad [name] is a 63 y.o. male patient with past medical history of bladder cancer, recent urostomy placement, diabetes, sleep apnea, hypertension is presenting to the emergency room with 1 week of feeling fatigued, decreased appetite.  He reports that approximately 3 days ago he started having loose stools about 1-2 episodes a day.  Denies any melena or hematochezia.  He reports that the last 3 days he has also had fever.  Reports Tmax of 101 F.  He denies any focal abdominal pain.  Denies any URI-like symptoms.  Denies chest pain or shortness of breath. No longer has bladder. No issues with urostomy bag reported.   HPI     Home Medications Prior to Admission medications   Medication Sig Start Date End Date Taking? Authorizing Provider  acetaminophen (TYLENOL) 500 MG tablet Take 500 mg by mouth every 6 (six) hours as needed for moderate pain (pain score 4-6).    [provider]  amLODipine (NORVASC) 10 MG tablet Take 1 tablet (10 mg total) by mouth daily. 12/15/20   Helane Rima, DO  blood glucose meter kit and supplies KIT Dispense based on patient and insurance preference. Check fasting blood sugar once daily ICD10 R73.03 10/11/16   Salley Scarlet, MD  chlorthalidone (HYGROTON) 25 MG tablet Take 1 tablet (25 mg total) by mouth daily. Patient taking differently: Take 25 mg by mouth daily as needed (fluid retention). 04/18/21   Helane Rima, DO  Cholecalciferol (VITAMIN D3 MAXIMUM STRENGTH) 125 MCG (5000 UT) capsule Take 5,000 Units by mouth daily.    [provider]  gabapentin (NEURONTIN) 100 MG capsule Take 1 capsule (100 mg total) by mouth at bedtime for 3 days, THEN 2 capsules (200 mg total) at bedtime. Patient taking differently: Take 100 mg at bedtime 03/21/23 04/23/23  Melven Sartorius, MD   glucose blood test strip Use as instructed to monitor FSBS 1x daily. Dx: R73.09 06/07/17   Salley Scarlet, MD  ibuprofen (ADVIL) 800 MG tablet Take 400 mg by mouth every 6 (six) hours as needed. 03/12/23   [provider]  labetalol (NORMODYNE) 200 MG tablet TAKE 1 TABLET(200 MG) BY MOUTH TWICE DAILY 04/18/21   Helane Rima, DO  Lancets [facility] ULTRASOFT) lancets Use as instructed 10/11/16   Salley Scarlet, MD  losartan (COZAAR) 100 MG tablet Take 1 tablet (100 mg total) by mouth daily. 12/15/20   Helane Rima, DO  methocarbamol (ROBAXIN) 500 MG tablet Take 1 tablet (500 mg total) by mouth 2 (two) times daily. Patient taking differently: Take 500 mg by mouth 2 (two) times daily as needed for muscle spasms. 03/17/23   Caron Presume, MD  Multiple Vitamins-Minerals (ONE-A-DAY MENS 50+ ADVANTAGE) TABS Take 1 tablet by mouth daily.    [provider]  oxyCODONE-acetaminophen (PERCOCET/ROXICET) 5-325 MG tablet Take 1 tablet by mouth every 6 (six) hours as needed for severe pain (pain score 7-10) (post-operatively). 04/03/23   [name]., MD  tadalafil (CIALIS) 20 MG tablet TAKE 1 TABLET(20 MG) BY MOUTH DAILY AS NEEDED 03/01/21   Helane Rima, DO      Allergies    Patient has no known allergies.    Review of Systems   Review of Systems  Physical Exam Updated Vital Signs BP 114/74 (BP Location: Right Arm)  Pulse 100   Temp 98.9 F (37.2 C)   Resp 20   Ht 6\' 9"  (2.057 m)   Wt 133.4 kg   SpO2 94%   BMI 31.51 kg/m  Physical Exam Vitals and nursing note reviewed.  Constitutional:      General: He is not in acute distress.    Appearance: He is not toxic-appearing.  HENT:     Head: Normocephalic and atraumatic.  Eyes:     General: No scleral icterus.    Conjunctiva/sclera: Conjunctivae normal.  Cardiovascular:     Rate and Rhythm: Normal rate and regular rhythm.     Pulses: Normal pulses.     Heart sounds: Normal heart sounds.  Pulmonary:      Effort: Pulmonary effort is normal. No respiratory distress.     Breath sounds: Normal breath sounds.  Abdominal:     General: Abdomen is flat. Bowel sounds are normal.     Palpations: Abdomen is soft.     Tenderness: There is no abdominal tenderness.     Comments: Right upper quadrant urostomy bag in place.  Draining clear yellow urine with small amount of sediment.   Musculoskeletal:     Right lower leg: No edema.     Left lower leg: No edema.  Skin:    General: Skin is warm and dry.     Findings: No lesion.  Neurological:     General: No focal deficit present.     Mental Status: He is alert and oriented to person, place, and time. Mental status is at baseline.     ED Results / Procedures / Treatments   Labs (all labs ordered are listed, but only abnormal results are displayed) Labs Reviewed  COMPREHENSIVE METABOLIC PANEL - Abnormal; Notable for the following components:      Result Value   Glucose, Bld 117 (*)    Creatinine, Ser 1.61 (*)    Albumin 3.0 (*)    GFR, Estimated 48 (*)    All other components within normal limits  CBC - Abnormal; Notable for the following components:   WBC 13.6 (*)    RBC 3.56 (*)    Hemoglobin 10.5 (*)    HCT 30.8 (*)    All other components within normal limits  URINALYSIS, ROUTINE W REFLEX MICROSCOPIC - Abnormal; Notable for the following components:   Color, Urine AMBER (*)    APPearance CLOUDY (*)    Hgb urine dipstick LARGE (*)    Protein, ur 100 (*)    Leukocytes,Ua LARGE (*)    Bacteria, UA MANY (*)    All other components within normal limits  RESP PANEL BY RT-PCR (RSV, FLU A&B, COVID)  RVPGX2  LIPASE, BLOOD  CBG MONITORING, ED    EKG None  Radiology CT ABDOMEN PELVIS W CONTRAST Result Date: 04/24/2023 CLINICAL DATA:  Acute abdominal pain and fevers, initial encounter EXAM: CT ABDOMEN AND PELVIS WITH CONTRAST TECHNIQUE: Multidetector CT imaging of the abdomen and pelvis was performed using the standard protocol following  bolus administration of intravenous contrast. RADIATION DOSE REDUCTION: This exam was performed according to the departmental dose-optimization program which includes automated exposure control, adjustment of the mA and/or kV according to patient size and/or use of iterative reconstruction technique. CONTRAST:  75mL OMNIPAQUE IOHEXOL 350 MG/ML SOLN COMPARISON:  None Available. FINDINGS: Lower chest: No acute abnormality. Hepatobiliary: No focal liver abnormality is seen. No gallstones, gallbladder wall thickening, or biliary dilatation. Pancreas: Unremarkable. No pancreatic ductal dilatation or surrounding inflammatory changes.  Spleen: Normal in size without focal abnormality. Adrenals/Urinary Tract: Adrenal glands are within normal limits. Stable right renal cyst is noted. No follow-up is recommended right kidney demonstrates evidence of a nephroureteral stent extending from the urostomy back into the renal collecting system. Similar findings are noted on the left. Minimal fullness of the left collecting system is noted. The urostomy is decompressed. Bladder has been surgically removed. Stomach/Bowel: Scattered diverticular change of the colon is noted without evidence of diverticulitis. No obstructive changes of the colon are seen. The appendix is not well visualized consistent with a prior surgical history. Small bowel is within normal limits. Stomach is unremarkable. Vascular/Lymphatic: Aortic atherosclerosis. No enlarged abdominal or pelvic lymph nodes. Reproductive: Prostate has been surgically removed. Other: Free fluid is noted within the pelvis which may be related to the recent surgical history. No abscess is identified in the operative bed. Musculoskeletal: No acute or significant osseous findings. IMPRESSION: Changes consistent with cystoprostatectomy with creation of urostomy in the right mid abdomen. Bilateral nephroureteral stents are identified extending from the urostomy bag into the collecting  systems bilaterally. No significant obstructive changes are noted. Free fluid in the pelvis which may be related to the prior surgery. No abscess is seen. Diverticulosis without diverticulitis. Electronically Signed   By: Alcide Clever M.D.   On: 04/24/2023 20:55    Procedures Procedures    Medications Ordered in ED Medications - No data to display  ED Course/ Medical Decision Making/ A&P                                 Medical Decision Making Amount and/or Complexity of Data Reviewed Labs: ordered. Radiology: ordered.  Risk OTC drugs. Prescription drug management.   This patient presents to the ED for concern of diarrhea, this involves an extensive number of treatment options, and is a complaint that carries with it a high risk of complications and morbidity.  The differential diagnosis includes enteritis, diverticulitis, appendicitis, mass, bowel obstruction, medication side effect   Co morbidities that complicate the patient evaluation  Bladder cancer    Lab Tests:  I personally interpreted labs.  The pertinent results include:   CBC with mild leukocytosis.  Hemoglobin is 10.5.  Respiratory panel is negative.  CMP without significant electrolyte abnormality.  Creatinine is 1.6, GFR is 48 with chest decree since prior recent labs.  Lipase is 19. UA is positive for hemoglobin, leukocytes, bacteria -- suspect this is secondary to urostomy    Imaging Studies ordered:  I ordered imaging studies including ct abd/pelvis   I independently visualized and interpreted imaging which showed no acute findings  I agree with the radiologist interpretation   Cardiac Monitoring: / EKG:  The patient was maintained on a cardiac monitor.     Problem List / ED Course / Critical interventions / Medication management  Reporting with complaint of diarrhea and generalized weakness.  Diarrhea has been over the past 3 days reports only 1-2 episodes no blood in stool.  Has not had any  recent antibiotic use, no suspicious food and travel.  He is not having chest pain, shortness of breath.  Lungs clear to auscultation bilaterally.  He is hemodynamically stable and well-appearing.  He does not have any focal area of abdominal tenderness.  Urostomy appears normal.  CBC with mild leukocytosis.  Hemoglobin stable at 10.5.  Respiratory panel negative CMP significant for slight elevation in creatinine 1.6 we will  give fluids.  Will have him follow-up to have creatinine rechecked.  UA positive however suspect this is secondary urostomy bag being in place.  He is not having urinary symptoms and no irritation around the site.  Obtain CT scan to rule out any acute abnormality. CT scan shows normal postoperative changes.  No acute abnormality.  Patient reports he is feeling better.  He is tolerating oral intake.  Feel symptoms are secondary to enteritis likely viral in origin.  Do not feel he needs to be started on antibiotics at this time.  Will proceed with symptomatic management.  He was given strict return precautions including if he is no longer tolerating oral intake to return.  Patient family ember in room expressed understanding and agreed to plan. I ordered medication including Tylenol, 1 L. Reevaluation of the patient after these medicines showed that the patient improved I have reviewed the patients home medicines and have made adjustments as needed   Plan  Stable for discharge. Given return precautions.          Final Clinical Impression(s) / ED Diagnoses Final diagnoses:  Diarrhea, unspecified type    Rx / DC Orders ED Discharge Orders     None         Reinaldo Raddle 04/24/23 2247    Jacalyn Lefevre, MD 04/25/23 [phone]

## 2023-04-24 NOTE — ED Notes (Signed)
 Patient discharged home. VSS. Patient ambulatory out of treatment area with clean and steady gait. All questions answered at time of discharge. Belongings sent home with patient.

## 2023-04-24 NOTE — Discharge Instructions (Addendum)
 Take tylenol 1000mg  every 6 hours. You can also take ibuprofen 3 times daily.  Make sure you are drinking water alternating electrolyte drink. Try bland foods like applesauce and toast.  You can take Imodium or pepto for loose stool.   If you are feeling worse, no longer eating and drinking or have a fever that dose not come down with Tylenol or ibuprofen you need to return.

## 2023-04-24 NOTE — ED Triage Notes (Addendum)
 Pt reports he had a temperature of 100 this morning, states he "feels off" and has a lack of appetite. He also reports diarrhea. He has a urostomy to RUQ. Denies any pain.

## 2023-04-26 ENCOUNTER — Other Ambulatory Visit: Payer: Self-pay

## 2023-04-26 ENCOUNTER — Ambulatory Visit (HOSPITAL_COMMUNITY)
Admission: RE | Admit: 2023-04-26 | Discharge: 2023-04-26 | Disposition: A | Discharge status: Home / Self Care | Source: Ambulatory Visit | Attending: Nurse Practitioner | Admitting: Nurse Practitioner

## 2023-04-26 DIAGNOSIS — C679 Malignant neoplasm of bladder, unspecified: Secondary | ICD-10-CM

## 2023-04-26 DIAGNOSIS — Z8546 Personal history of malignant neoplasm of prostate: Secondary | ICD-10-CM | POA: Insufficient documentation

## 2023-04-26 DIAGNOSIS — Z436 Encounter for attention to other artificial openings of urinary tract: Secondary | ICD-10-CM

## 2023-04-26 DIAGNOSIS — D649 Anemia, unspecified: Secondary | ICD-10-CM

## 2023-04-26 DIAGNOSIS — Z9889 Other specified postprocedural states: Secondary | ICD-10-CM | POA: Insufficient documentation

## 2023-04-26 DIAGNOSIS — L24B3 Irritant contact dermatitis related to fecal or urinary stoma or fistula: Secondary | ICD-10-CM | POA: Diagnosis present

## 2023-04-26 NOTE — Assessment & Plan Note (Addendum)
 S/p Neoadjuvant ddMVAC. Tolerated well S/p cystoprostatectomy. ypT0. pCR CT CAP in 3 months about end of June CBC, CMP in July, lab a few days before visit.

## 2023-04-26 NOTE — Assessment & Plan Note (Addendum)
 Small focus of prostatic adenocarcinoma, Gleason score 3+3=6 (grade group 1) completed resected during cystoprostatectomy

## 2023-04-26 NOTE — Progress Notes (Unsigned)
 [facility] OFFICE PROGRESS NOTE  Patient Care Team: Levin Erp, MD as PCP - General (Family Medicine) [name], Deloris Ping, [name], [facility] Melven Sartorius, [name]  Brad [name] is a 63 y.o.male with history of hypertension, CVA, type 2 diabetes, OSA being [facility] for muscle invasive bladder cancer.   Diagnosis: MIBC Treatment: neoadjuvant ddMVAC x4 10-12/2022 03/29/23 Cystoprostatectomy and bilateral pelvic lymph node dissection, ileal conduit urinary diversion. pCR, ypT0 Incidental prostatic adenocarcinoma, Gleason score 3+3=6 (grade group 1) completed resected  Discussed result and pathology. Discussed follow up plan. Assessment & Plan Malignant neoplasm of urinary bladder, unspecified site [facility]) S/p Neoadjuvant ddMVAC. Tolerated well S/p cystoprostatectomy. ypT0. pCR CT CAP in 3 months about end of June CBC, CMP in July, lab a few days before visit. History of prostate cancer Small focus of prostatic adenocarcinoma, Gleason score 3+3=6 (grade group 1) completed resected during cystoprostatectomy Will obtain a baseline History of ileal conduit B12 1000 mcg daily Repeat in about 3 months  Orders Placed This Encounter  Procedures   CT CHEST ABDOMEN PELVIS W CONTRAST    Standing Status:   Future    Expected Date:   07/29/2023    Expiration Date:   04/28/2024    If indicated for the ordered procedure, I authorize the administration of contrast media per Radiology protocol:   Yes    Does the patient have a contrast media/X-ray dye allergy?:   No    Preferred imaging location?:   [facility]    If indicated for the ordered procedure, I authorize the administration of oral contrast media per Radiology protocol:   Yes     Melven Sartorius, MD  INTERVAL HISTORY: Patient returns for follow-up. Overall feeling better. Some fatigue. No other  symptoms.  Oncology History  Bladder cancer (HCC)  08/28/2022 Imaging   1. Contrast enhancing, lobulated endoluminal mass of the anterior bladder dome, measuring 2.6 x 2.0 x 1.2 cm, consistent with primary bladder malignancy. 2. Prominent varices about the bladder dome and anterior bladder, without overt evidence of extramural tumor extent. 3. No evidence of lymphadenopathy or metastatic disease in the abdomen or pelvis. 4. Prostatomegaly.   09/11/2022 Initial Diagnosis   Bladder cancer [name]) At age 63 year old found to have a solid enhancing 2.5 cm anterior bladder dome mass on CT hematuria protocol on 08/28/2022 during evaluation for gross hematuria.    09/26/2022 Pathology Results   A. BLADDER TUMOR, SUPERFICIAL MARGIN DOME, TURBT:  High grade papillary urothelial carcinoma with inverted growth pattern  The carcinoma invades muscularis propria   B. BLADDER TUMOR, DEEP MARGIN DOME, TURBT:  Invasive high grade papillary urothelial carcinoma  Muscularis propria (detrusor muscle) is present and not involved by  carcinoma   C. BLADDER TUMOR, INFERIOR MARGIN, BIOPSY:  Submucosa and muscularis propria is present and not involved by  carcinoma    09/26/2022 Procedure   1.  Cystoscopy with TURBT (medium) 2.  Intravesical instillation of gemcitabine 3.  Cystogram with intraoperative interpretation of fluoroscopic imaging   11/22/2022 - 01/07/2023 Chemotherapy   Patient is on Treatment Plan : BLADDER DOSE DENSE MVAC q14d     01/29/2023 Imaging   CT CAP: IMPRESSION: 1. Interval resection of the previously visualized bladder mass without evidence of local recurrence given limited distension. 2. No evidence of metastatic disease within the chest, abdomen or pelvis. 3.  Aortic Atherosclerosis (ICD10-I70.0).   03/07/2023 Cancer  Staging   Staging form: Urinary Bladder, AJCC 8th Edition - Clinical: Stage II (cT2, cN0, cM0) - Signed by Melven Sartorius, MD on 03/07/2023 Stage prefix: Initial  diagnosis WHO/ISUP grade (low/high): High Grade Histologic grading system: 2 grade system   03/29/2023 Pathology Results   CYSTOPROSTATECTOMY and bilateral pelvic lymph node dissection, ileal conduit urinary diversion   No residual carcinoma identified (ypT0)  All margins of resection are negative for carcinoma   Small focus of prostatic adenocarcinoma, Gleason score 3+3=6 (grade group 1)  Tumor is organ confined (ypT2 ypN0)  Seminal vesicles, vasa deferentia and all margins of resection are  negative for tumor         PHYSICAL EXAMINATION: ECOG PERFORMANCE STATUS: 1 - Symptomatic but completely ambulatory  Vitals:   04/29/23 1025  BP: 111/70  Pulse: 81  Resp: 17  Temp: 97.6 F (36.4 C)  SpO2: 100%   Filed Weights   04/29/23 1025  Weight: 291 lb 7 oz (132.2 kg)   Gen: no distress  Relevant data reviewed during this visit included lab and pathology.

## 2023-04-26 NOTE — Assessment & Plan Note (Addendum)
 B12 1000 mcg daily

## 2023-04-26 NOTE — Progress Notes (Signed)
 [facility]   Reason for visit:  RLQ ileal conduit, 2 stents in place  Red (right) and blue (Left)  draining clear yellow urine HPI:  Bladder cancer with cystoproctatectomy Past Medical History:  Diagnosis Date   Abnormal ultrasound of carotid artery 04/2014   no significant obstruction   Ataxia 04/2014   Cancer (HCC)    Diabetes (HCC)    EKG abnormalities 04/2014   poor R wave progression, NSR   Former smoker    25 years x 1.5 ppd, stopped 04/2014   Gait disturbance 04/2014   s/p stroke   H/O echocardiogram 04/2014   moderate LVH, 60-65% EF, no valve disease   History of heart attack    Hypertension    Hypertensive heart disease 04/2014   Intraparenchymal hemorrhage of brain (HCC) 05/05/2014   hospitalization [facility]   Myocardial infarction [facility])    Obesity    Prediabetes    Short-term memory loss 04/2014   Sleep apnea    Stroke (HCC) 04/2014   Family History  Problem Relation Age of Onset   Heart disease Mother    Hypertension Mother    Sudden death Mother    Hypertension Brother    Aneurysm Paternal Grandmother    No Known Allergies Current Outpatient Medications  Medication Sig Dispense Refill Last Dose/Taking   acetaminophen (TYLENOL) 500 MG tablet Take 500 mg by mouth every 6 (six) hours as needed for moderate pain (pain score 4-6).      amLODipine (NORVASC) 10 MG tablet Take 1 tablet (10 mg total) by mouth daily. 90 tablet 3    blood glucose meter kit and supplies KIT Dispense based on patient and insurance preference. Check fasting blood sugar once daily ICD10 R73.03 1 each 0    chlorthalidone (HYGROTON) 25 MG tablet Take 1 tablet (25 mg total) by mouth daily. (Patient taking differently: Take 25 mg by mouth daily as needed (fluid retention).) 30 tablet 2    Cholecalciferol (VITAMIN D3 MAXIMUM STRENGTH) 125 MCG (5000 UT) capsule Take 5,000 Units by mouth daily.      gabapentin (NEURONTIN) 100 MG capsule Take 1 capsule (100 mg  total) by mouth at bedtime for 3 days, THEN 2 capsules (200 mg total) at bedtime. (Patient taking differently: Take 100 mg at bedtime) 63 capsule 0    glucose blood test strip Use as instructed to monitor FSBS 1x daily. Dx: R73.09 100 each 12    ibuprofen (ADVIL) 800 MG tablet Take 400 mg by mouth every 6 (six) hours as needed.      labetalol (NORMODYNE) 200 MG tablet TAKE 1 TABLET(200 MG) BY MOUTH TWICE DAILY 180 tablet 3    Lancets (ONETOUCH ULTRASOFT) lancets Use as instructed 100 each 12    losartan (COZAAR) 100 MG tablet Take 1 tablet (100 mg total) by mouth daily. 90 tablet 3    methocarbamol (ROBAXIN) 500 MG tablet Take 1 tablet (500 mg total) by mouth 2 (two) times daily. (Patient taking differently: Take 500 mg by mouth 2 (two) times daily as needed for muscle spasms.) 20 tablet 0    Multiple Vitamins-Minerals (ONE-A-DAY MENS 50+ ADVANTAGE) TABS Take 1 tablet by mouth daily.      oxyCODONE-acetaminophen (PERCOCET/ROXICET) 5-325 MG tablet Take 1 tablet by mouth every 6 (six) hours as needed for severe pain (pain score 7-10) (post-operatively). 10 tablet 0    tadalafil (CIALIS) 20 MG tablet TAKE 1 TABLET(20 MG) BY MOUTH DAILY AS NEEDED 30 tablet 3  No current facility-administered medications for this encounter.   ROS  Review of Systems  Constitutional:        Gaining strength  Eyes: Negative.   Respiratory: Negative.    Genitourinary:        RLQ ileal conduit  Bladder cancer  Skin:  Positive for color change (irritant dermatitis).  All other systems reviewed and are negative.  Vital signs:  BP (!) 148/124   Pulse 89   Temp 98.2 F (36.8 C) (Oral)   Resp 20   SpO2 97%  Exam:  Physical Exam Vitals reviewed.  Constitutional:      Appearance: Normal appearance.  Cardiovascular:     Pulses: Normal pulses.     Heart sounds: Normal heart sounds.  Pulmonary:     Effort: Pulmonary effort is normal.  Abdominal:     Palpations: Abdomen is soft.  Genitourinary:    Comments:  Cystoproctatectomy with ileal conduit Musculoskeletal:        General: Normal range of motion.     Right lower leg: No edema.  Skin:    General: Skin is warm and dry.     Findings: Erythema present.     Comments: Stoma in crease, erythema around stoma from leaks.   Neurological:     General: No focal deficit present.  Psychiatric:        Mood and Affect: Mood normal.        Behavior: Behavior normal.     Stoma type/location:  RLQ ileal conduit with stents in place Stomal assessment/size:  1" pink and moist Peristomal assessment:  stoma in a circumferential valley with creasing at 3 and 9 o'clock Treatment options for stomal/peristomal skin: in a flat pouch. Need to switch to convex pouch due to creasing and obese abdomen Output: clear yellow urine  Ostomy pouching: 1pc.convex  stoma powder and skin prep with barrier ring and 1 piece convex pouch Adding ostomy belt Adding barrier strips to perimeter of pouch to promote seal.  Education provided:  We perform pouch change.  I explain importance of convexity for his flush stoma, in a crease.  I recommend presized pouches.  I will set up with [facility] but [facility] is still in and providing supplies until they discharge.      Impression/dx  Ileal conduit Contact irritant dermatitis Discussion  See above  switch to convex pouch with belt.  Plan  Adding extension to bedside drainage due to patient height.     Visit time: 55 minutes.   Mike Gip FNP-BC

## 2023-04-26 NOTE — Discharge Instructions (Signed)
 Stoma is still 1"  Stoma powder and skin prep Barrier ring 1 piece convex pouch (will likely always need convex ) Ostomy belt  Y SHAPED barrier strips (provide more coverage in crease)  ITEM #[id number]   *I will send order to [facility] using pre-sized pouch*

## 2023-04-29 ENCOUNTER — Inpatient Hospital Stay (HOSPITAL_BASED_OUTPATIENT_CLINIC_OR_DEPARTMENT_OTHER): Payer: Medicare Other

## 2023-04-29 ENCOUNTER — Inpatient Hospital Stay: Payer: Medicare Other

## 2023-04-29 VITALS — BP 111/70 | HR 81 | Temp 97.6°F | Resp 17 | Ht >= 80 in | Wt 291.4 lb

## 2023-04-29 DIAGNOSIS — C679 Malignant neoplasm of bladder, unspecified: Secondary | ICD-10-CM | POA: Diagnosis not present

## 2023-04-29 DIAGNOSIS — Z8546 Personal history of malignant neoplasm of prostate: Secondary | ICD-10-CM | POA: Insufficient documentation

## 2023-04-29 DIAGNOSIS — Z9889 Other specified postprocedural states: Secondary | ICD-10-CM

## 2023-04-29 DIAGNOSIS — R310 Gross hematuria: Secondary | ICD-10-CM | POA: Diagnosis not present

## 2023-04-29 DIAGNOSIS — Z9079 Acquired absence of other genital organ(s): Secondary | ICD-10-CM | POA: Insufficient documentation

## 2023-04-29 DIAGNOSIS — Z79899 Other long term (current) drug therapy: Secondary | ICD-10-CM | POA: Diagnosis not present

## 2023-04-29 DIAGNOSIS — I700 Atherosclerosis of aorta: Secondary | ICD-10-CM | POA: Diagnosis not present

## 2023-04-29 DIAGNOSIS — C678 Malignant neoplasm of overlapping sites of bladder: Secondary | ICD-10-CM | POA: Insufficient documentation

## 2023-04-29 DIAGNOSIS — D649 Anemia, unspecified: Secondary | ICD-10-CM

## 2023-04-29 DIAGNOSIS — N400 Enlarged prostate without lower urinary tract symptoms: Secondary | ICD-10-CM | POA: Diagnosis not present

## 2023-04-29 LAB — CBC WITH DIFFERENTIAL (CANCER CENTER ONLY)
Abs Immature Granulocytes: 0.04 10*3/uL (ref 0.00–0.07)
Basophils Absolute: 0 10*3/uL (ref 0.0–0.1)
Basophils Relative: 0 %
Eosinophils Absolute: 0.2 10*3/uL (ref 0.0–0.5)
Eosinophils Relative: 2 %
HCT: 28.3 % — ABNORMAL LOW (ref 39.0–52.0)
Hemoglobin: 9.7 g/dL — ABNORMAL LOW (ref 13.0–17.0)
Immature Granulocytes: 0 %
Lymphocytes Relative: 14 %
Lymphs Abs: 1.4 10*3/uL (ref 0.7–4.0)
MCH: 29.3 pg (ref 26.0–34.0)
MCHC: 34.3 g/dL (ref 30.0–36.0)
MCV: 85.5 fL (ref 80.0–100.0)
Monocytes Absolute: 1 10*3/uL (ref 0.1–1.0)
Monocytes Relative: 10 %
Neutro Abs: 7.6 10*3/uL (ref 1.7–7.7)
Neutrophils Relative %: 74 %
Platelet Count: 311 10*3/uL (ref 150–400)
RBC: 3.31 MIL/uL — ABNORMAL LOW (ref 4.22–5.81)
RDW: 12.4 % (ref 11.5–15.5)
WBC Count: 10.2 10*3/uL (ref 4.0–10.5)
nRBC: 0 % (ref 0.0–0.2)

## 2023-04-29 LAB — IRON AND IRON BINDING CAPACITY (CC-WL,HP ONLY)
Iron: 13 ug/dL — ABNORMAL LOW (ref 45–182)
Saturation Ratios: 7 % — ABNORMAL LOW (ref 17.9–39.5)
TIBC: 183 ug/dL — ABNORMAL LOW (ref 250–450)
UIBC: 170 ug/dL (ref 117–376)

## 2023-04-29 LAB — CMP (CANCER CENTER ONLY)
ALT: 25 U/L (ref 0–44)
AST: 20 U/L (ref 15–41)
Albumin: 3.6 g/dL (ref 3.5–5.0)
Alkaline Phosphatase: 54 U/L (ref 38–126)
Anion gap: 11 (ref 5–15)
BUN: 15 mg/dL (ref 8–23)
CO2: 21 mmol/L — ABNORMAL LOW (ref 22–32)
Calcium: 9.1 mg/dL (ref 8.9–10.3)
Chloride: 103 mmol/L (ref 98–111)
Creatinine: 1.38 mg/dL — ABNORMAL HIGH (ref 0.61–1.24)
GFR, Estimated: 58 mL/min — ABNORMAL LOW (ref >60)
Glucose, Bld: 130 mg/dL — ABNORMAL HIGH (ref 70–99)
Potassium: 3.5 mmol/L (ref 3.5–5.1)
Sodium: 135 mmol/L (ref 135–145)
Total Bilirubin: 0.7 mg/dL (ref 0.0–1.2)
Total Protein: 7.2 g/dL (ref 6.5–8.1)

## 2023-04-29 LAB — MAGNESIUM: Magnesium: 1.6 mg/dL — ABNORMAL LOW (ref 1.7–2.4)

## 2023-04-29 LAB — FERRITIN: Ferritin: 458 ng/mL — ABNORMAL HIGH (ref 24–336)

## 2023-04-29 LAB — FOLATE: Folate: 8.4 ng/mL (ref >5.9)

## 2023-04-29 LAB — VITAMIN B12: Vitamin B-12: 1213 pg/mL — ABNORMAL HIGH (ref 180–914)

## 2023-04-30 ENCOUNTER — Other Ambulatory Visit: Payer: Self-pay

## 2023-04-30 ENCOUNTER — Telehealth: Payer: Self-pay

## 2023-04-30 DIAGNOSIS — D649 Anemia, unspecified: Secondary | ICD-10-CM

## 2023-04-30 NOTE — Telephone Encounter (Signed)
 Scheduled patient's appts. Advised patient to contact us if rescheduling is needed. Provided my direct line.

## 2023-04-30 NOTE — Telephone Encounter (Signed)
 Patient scheduled appointments. Patient is aware of all appointment details. I informed Brad [name] to get his scan scheduled 1 hour after his lab appointment on 6/30.

## 2023-05-02 ENCOUNTER — Other Ambulatory Visit: Payer: Self-pay

## 2023-05-02 DIAGNOSIS — D649 Anemia, unspecified: Secondary | ICD-10-CM

## 2023-05-03 ENCOUNTER — Ambulatory Visit (HOSPITAL_COMMUNITY): Admitting: Nurse Practitioner

## 2023-05-03 ENCOUNTER — Ambulatory Visit (HOSPITAL_COMMUNITY)
Admission: RE | Admit: 2023-05-03 | Discharge: 2023-05-03 | Disposition: A | Discharge status: Home / Self Care | Source: Ambulatory Visit | Attending: *Deleted | Admitting: *Deleted

## 2023-05-03 DIAGNOSIS — N99528 Other complication of other external stoma of urinary tract: Secondary | ICD-10-CM | POA: Insufficient documentation

## 2023-05-03 NOTE — Discharge Instructions (Signed)
 1 piece convex pouch will order presized 1"  Ostomy belt recommended Stoma powder skin prep Barrier ring to crease at 3 and 9 o'clock and remainder of ring around stoma [email or web address]

## 2023-05-14 ENCOUNTER — Other Ambulatory Visit (HOSPITAL_COMMUNITY): Payer: Self-pay | Admitting: Nurse Practitioner

## 2023-05-14 DIAGNOSIS — L24B3 Irritant contact dermatitis related to fecal or urinary stoma or fistula: Secondary | ICD-10-CM

## 2023-05-14 DIAGNOSIS — Z436 Encounter for attention to other artificial openings of urinary tract: Secondary | ICD-10-CM

## 2023-05-20 ENCOUNTER — Ambulatory Visit (HOSPITAL_COMMUNITY)
Admission: RE | Admit: 2023-05-20 | Discharge: 2023-05-20 | Disposition: A | Discharge status: Home / Self Care | Source: Ambulatory Visit | Attending: Nurse Practitioner | Admitting: Nurse Practitioner

## 2023-05-20 DIAGNOSIS — Z932 Ileostomy status: Secondary | ICD-10-CM | POA: Diagnosis present

## 2023-05-20 DIAGNOSIS — I252 Old myocardial infarction: Secondary | ICD-10-CM | POA: Insufficient documentation

## 2023-05-20 DIAGNOSIS — Z436 Encounter for attention to other artificial openings of urinary tract: Secondary | ICD-10-CM | POA: Diagnosis not present

## 2023-05-20 DIAGNOSIS — C679 Malignant neoplasm of bladder, unspecified: Secondary | ICD-10-CM | POA: Diagnosis not present

## 2023-05-20 DIAGNOSIS — Z87891 Personal history of nicotine dependence: Secondary | ICD-10-CM | POA: Diagnosis not present

## 2023-05-20 NOTE — Discharge Instructions (Signed)
 [name]  [phone]

## 2023-05-20 NOTE — Progress Notes (Signed)
 [facility]   Reason for visit:  RLQ ileal conduit   stents recently removed and this has changed pouch seal.  HPI:  Bladder cancer with ileal conduit Past Medical History:  Diagnosis Date   Abnormal ultrasound of carotid artery 04/2014   no significant obstruction   Ataxia 04/2014   Cancer (HCC)    Diabetes (HCC)    EKG abnormalities 04/2014   poor R wave progression, NSR   Former smoker    25 years x 1.5 ppd, stopped 04/2014   Gait disturbance 04/2014   s/p stroke   H/O echocardiogram 04/2014   moderate LVH, 60-65% EF, no valve disease   History of heart attack    Hypertension    Hypertensive heart disease 04/2014   Intraparenchymal hemorrhage of brain (HCC) 05/05/2014   hospitalization [facility]   Myocardial infarction [facility])    Obesity    Prediabetes    Short-term memory loss 04/2014   Sleep apnea    Stroke (HCC) 04/2014   Family History  Problem Relation Age of Onset   Heart disease Mother    Hypertension Mother    Sudden death Mother    Hypertension Brother    Aneurysm Paternal Grandmother    No Known Allergies Current Outpatient Medications  Medication Sig Dispense Refill Last Dose/Taking   acetaminophen  (TYLENOL ) 500 MG tablet Take 500 mg by mouth every 6 (six) hours as needed for moderate pain (pain score 4-6).      amLODipine  (NORVASC ) 10 MG tablet Take 1 tablet (10 mg total) by mouth daily. 90 tablet 3    blood glucose meter kit and supplies KIT Dispense based on patient and insurance preference. Check fasting blood sugar once daily ICD10 R73.03 1 each 0    chlorthalidone  (HYGROTON ) 25 MG tablet Take 1 tablet (25 mg total) by mouth daily. (Patient taking differently: Take 25 mg by mouth daily as needed (fluid retention).) 30 tablet 2    Cholecalciferol (VITAMIN D3 MAXIMUM STRENGTH) 125 MCG (5000 UT) capsule Take 5,000 Units by mouth daily.      gabapentin  (NEURONTIN ) 100 MG capsule Take 1 capsule (100 mg total) by mouth at  bedtime for 3 days, THEN 2 capsules (200 mg total) at bedtime. (Patient taking differently: Take 100 mg at bedtime) 63 capsule 0    glucose blood test strip Use as instructed to monitor FSBS 1x daily. Dx: R73.09 100 each 12    ibuprofen (ADVIL) 800 MG tablet Take 400 mg by mouth every 6 (six) hours as needed.      labetalol  (NORMODYNE ) 200 MG tablet TAKE 1 TABLET(200 MG) BY MOUTH TWICE DAILY 180 tablet 3    Lancets (ONETOUCH ULTRASOFT) lancets Use as instructed 100 each 12    losartan  (COZAAR ) 100 MG tablet Take 1 tablet (100 mg total) by mouth daily. 90 tablet 3    methocarbamol  (ROBAXIN ) 500 MG tablet Take 1 tablet (500 mg total) by mouth 2 (two) times daily. (Patient taking differently: Take 500 mg by mouth 2 (two) times daily as needed for muscle spasms.) 20 tablet 0    Multiple Vitamins-Minerals (ONE-A-DAY MENS 50+ ADVANTAGE) TABS Take 1 tablet by mouth daily.      oxyCODONE -acetaminophen  (PERCOCET/ROXICET) 5-325 MG tablet Take 1 tablet by mouth every 6 (six) hours as needed for severe pain (pain score 7-10) (post-operatively). 10 tablet 0    tadalafil  (CIALIS ) 20 MG tablet TAKE 1 TABLET(20 MG) BY MOUTH DAILY AS NEEDED 30 tablet 3  No current facility-administered medications for this encounter.   ROS  Review of Systems  Constitutional: Negative.   Gastrointestinal:        RLQ ileal conduit  Genitourinary:        Ileal conduit  stents removed and some leaking, decreased wear time  Skin:  Positive for color change.  Psychiatric/Behavioral: Negative.    All other systems reviewed and are negative.  Vital signs:  BP 103/69 (BP Location: Right Arm)   Pulse 90   Temp 97.6 F (36.4 C) (Oral)   Resp 18   SpO2 100%  Exam:  Physical Exam Vitals reviewed.  Constitutional:      Appearance: Normal appearance.  Cardiovascular:     Rate and Rhythm: Normal rate and regular rhythm.     Pulses: Normal pulses.     Heart sounds: Normal heart sounds.  Pulmonary:     Effort: Pulmonary  effort is normal.     Breath sounds: Normal breath sounds.  Abdominal:     Palpations: Abdomen is soft.  Genitourinary:    Comments: Hx bladder cancer Musculoskeletal:        General: Normal range of motion.  Skin:    General: Skin is warm and dry.     Findings: Erythema present.  Neurological:     General: No focal deficit present.     Mental Status: He is alert and oriented to person, place, and time.  Psychiatric:        Mood and Affect: Mood normal.        Behavior: Behavior normal.     Stoma type/location:  RLQ ileal conduit  Stomal assessment/size:  1" pink and moist  budded Peristomal assessment:  intact Treatment options for stomal/peristomal skin: stoma powder and skin prep  barrier ring  convex pouch and ostomy belt.  Output: clear yellow urine Ostomy pouching: 1pc. Convex with belt Education provided:  discussed importance of barrier ring and ostomy belt to promote secure seal.  Urine flows continuously from stoma now without stents to direct into the pouch    Impression/dx  Ileal conduit Discussion  Ensuring adequate seal due to increased risk of urine leakage without stents Plan  See back as needed./     Visit time: 45 minutes.   Branda Cain FNP-BC

## 2023-05-28 ENCOUNTER — Other Ambulatory Visit: Payer: Self-pay

## 2023-05-28 ENCOUNTER — Inpatient Hospital Stay

## 2023-05-28 DIAGNOSIS — C678 Malignant neoplasm of overlapping sites of bladder: Secondary | ICD-10-CM | POA: Diagnosis present

## 2023-05-28 DIAGNOSIS — Z79899 Other long term (current) drug therapy: Secondary | ICD-10-CM | POA: Diagnosis not present

## 2023-05-28 DIAGNOSIS — C679 Malignant neoplasm of bladder, unspecified: Secondary | ICD-10-CM

## 2023-05-28 DIAGNOSIS — D649 Anemia, unspecified: Secondary | ICD-10-CM

## 2023-05-28 LAB — CMP (CANCER CENTER ONLY)
ALT: 21 U/L (ref 0–44)
AST: 15 U/L (ref 15–41)
Albumin: 3.7 g/dL (ref 3.5–5.0)
Alkaline Phosphatase: 58 U/L (ref 38–126)
Anion gap: 5 (ref 5–15)
BUN: 20 mg/dL (ref 8–23)
CO2: 22 mmol/L (ref 22–32)
Calcium: 9.4 mg/dL (ref 8.9–10.3)
Chloride: 112 mmol/L — ABNORMAL HIGH (ref 98–111)
Creatinine: 1.41 mg/dL — ABNORMAL HIGH (ref 0.61–1.24)
GFR, Estimated: 57 mL/min — ABNORMAL LOW (ref >60)
Glucose, Bld: 117 mg/dL — ABNORMAL HIGH (ref 70–99)
Potassium: 4.7 mmol/L (ref 3.5–5.1)
Sodium: 139 mmol/L (ref 135–145)
Total Bilirubin: 0.3 mg/dL (ref 0.0–1.2)
Total Protein: 7.6 g/dL (ref 6.5–8.1)

## 2023-05-28 LAB — CBC WITH DIFFERENTIAL (CANCER CENTER ONLY)
Abs Immature Granulocytes: 0.02 K/uL (ref 0.00–0.07)
Basophils Absolute: 0 K/uL (ref 0.0–0.1)
Basophils Relative: 0 %
Eosinophils Absolute: 0.2 K/uL (ref 0.0–0.5)
Eosinophils Relative: 2 %
HCT: 24.7 % — ABNORMAL LOW (ref 39.0–52.0)
Hemoglobin: 8.4 g/dL — ABNORMAL LOW (ref 13.0–17.0)
Immature Granulocytes: 0 %
Lymphocytes Relative: 18 %
Lymphs Abs: 1.3 K/uL (ref 0.7–4.0)
MCH: 27.6 pg (ref 26.0–34.0)
MCHC: 34 g/dL (ref 30.0–36.0)
MCV: 81.3 fL (ref 80.0–100.0)
Monocytes Absolute: 0.5 K/uL (ref 0.1–1.0)
Monocytes Relative: 7 %
Neutro Abs: 5.4 K/uL (ref 1.7–7.7)
Neutrophils Relative %: 73 %
Platelet Count: 317 K/uL (ref 150–400)
RBC: 3.04 MIL/uL — ABNORMAL LOW (ref 4.22–5.81)
RDW: 13.8 % (ref 11.5–15.5)
WBC Count: 7.5 K/uL (ref 4.0–10.5)
nRBC: 0 % (ref 0.0–0.2)

## 2023-05-28 LAB — IRON AND IRON BINDING CAPACITY (CC-WL,HP ONLY)
Iron: 57 ug/dL (ref 45–182)
Saturation Ratios: 28 % (ref 17.9–39.5)
TIBC: 202 ug/dL — ABNORMAL LOW (ref 250–450)
UIBC: 145 ug/dL (ref 117–376)

## 2023-05-28 LAB — FERRITIN: Ferritin: 746 ng/mL — ABNORMAL HIGH (ref 24–336)

## 2023-05-28 LAB — VITAMIN B12: Vitamin B-12: 1425 pg/mL — ABNORMAL HIGH (ref 180–914)

## 2023-05-28 LAB — MAGNESIUM: Magnesium: 1.6 mg/dL — ABNORMAL LOW (ref 1.7–2.4)

## 2023-05-31 ENCOUNTER — Inpatient Hospital Stay (HOSPITAL_COMMUNITY)
Admission: EM | Admit: 2023-05-31 | Discharge: 2023-06-04 | DRG: 309 | Disposition: A | Discharge status: Home Health | Attending: Family Medicine | Admitting: Family Medicine

## 2023-05-31 ENCOUNTER — Other Ambulatory Visit: Payer: Self-pay

## 2023-05-31 ENCOUNTER — Encounter (HOSPITAL_COMMUNITY): Payer: Self-pay | Admitting: Emergency Medicine

## 2023-05-31 DIAGNOSIS — Z8551 Personal history of malignant neoplasm of bladder: Secondary | ICD-10-CM

## 2023-05-31 DIAGNOSIS — I10 Essential (primary) hypertension: Secondary | ICD-10-CM | POA: Diagnosis present

## 2023-05-31 DIAGNOSIS — R296 Repeated falls: Secondary | ICD-10-CM | POA: Diagnosis present

## 2023-05-31 DIAGNOSIS — I4891 Unspecified atrial fibrillation: Principal | ICD-10-CM | POA: Insufficient documentation

## 2023-05-31 DIAGNOSIS — G4733 Obstructive sleep apnea (adult) (pediatric): Secondary | ICD-10-CM | POA: Diagnosis present

## 2023-05-31 DIAGNOSIS — R7989 Other specified abnormal findings of blood chemistry: Secondary | ICD-10-CM | POA: Insufficient documentation

## 2023-05-31 DIAGNOSIS — E1159 Type 2 diabetes mellitus with other circulatory complications: Secondary | ICD-10-CM

## 2023-05-31 DIAGNOSIS — Z9889 Other specified postprocedural states: Secondary | ICD-10-CM

## 2023-05-31 DIAGNOSIS — I252 Old myocardial infarction: Secondary | ICD-10-CM

## 2023-05-31 DIAGNOSIS — N390 Urinary tract infection, site not specified: Secondary | ICD-10-CM | POA: Insufficient documentation

## 2023-05-31 DIAGNOSIS — Z789 Other specified health status: Secondary | ICD-10-CM

## 2023-05-31 DIAGNOSIS — D509 Iron deficiency anemia, unspecified: Secondary | ICD-10-CM | POA: Insufficient documentation

## 2023-05-31 DIAGNOSIS — Z87891 Personal history of nicotine dependence: Secondary | ICD-10-CM

## 2023-05-31 DIAGNOSIS — Z923 Personal history of irradiation: Secondary | ICD-10-CM

## 2023-05-31 DIAGNOSIS — Z8546 Personal history of malignant neoplasm of prostate: Secondary | ICD-10-CM

## 2023-05-31 DIAGNOSIS — E119 Type 2 diabetes mellitus without complications: Secondary | ICD-10-CM | POA: Diagnosis present

## 2023-05-31 DIAGNOSIS — D649 Anemia, unspecified: Secondary | ICD-10-CM | POA: Insufficient documentation

## 2023-05-31 DIAGNOSIS — Z79899 Other long term (current) drug therapy: Secondary | ICD-10-CM

## 2023-05-31 DIAGNOSIS — N179 Acute kidney failure, unspecified: Secondary | ICD-10-CM

## 2023-05-31 DIAGNOSIS — M25561 Pain in right knee: Secondary | ICD-10-CM | POA: Diagnosis present

## 2023-05-31 DIAGNOSIS — Z936 Other artificial openings of urinary tract status: Secondary | ICD-10-CM

## 2023-05-31 DIAGNOSIS — I959 Hypotension, unspecified: Secondary | ICD-10-CM | POA: Diagnosis present

## 2023-05-31 DIAGNOSIS — R55 Syncope and collapse: Principal | ICD-10-CM | POA: Diagnosis present

## 2023-05-31 DIAGNOSIS — Z8673 Personal history of transient ischemic attack (TIA), and cerebral infarction without residual deficits: Secondary | ICD-10-CM

## 2023-05-31 DIAGNOSIS — M25562 Pain in left knee: Secondary | ICD-10-CM | POA: Diagnosis present

## 2023-05-31 DIAGNOSIS — Z9221 Personal history of antineoplastic chemotherapy: Secondary | ICD-10-CM

## 2023-05-31 LAB — COMPREHENSIVE METABOLIC PANEL WITH GFR
ALT: 32 U/L (ref 0–44)
AST: 27 U/L (ref 15–41)
Albumin: 3.1 g/dL — ABNORMAL LOW (ref 3.5–5.0)
Alkaline Phosphatase: 56 U/L (ref 38–126)
Anion gap: 11 (ref 5–15)
BUN: 41 mg/dL — ABNORMAL HIGH (ref 8–23)
CO2: 18 mmol/L — ABNORMAL LOW (ref 22–32)
Calcium: 9.2 mg/dL (ref 8.9–10.3)
Chloride: 102 mmol/L (ref 98–111)
Creatinine, Ser: 2.52 mg/dL — ABNORMAL HIGH (ref 0.61–1.24)
GFR, Estimated: 28 mL/min — ABNORMAL LOW (ref >60)
Glucose, Bld: 171 mg/dL — ABNORMAL HIGH (ref 70–99)
Potassium: 3.7 mmol/L (ref 3.5–5.1)
Sodium: 131 mmol/L — ABNORMAL LOW (ref 135–145)
Total Bilirubin: 0.7 mg/dL (ref 0.0–1.2)
Total Protein: 7.5 g/dL (ref 6.5–8.1)

## 2023-05-31 LAB — URINALYSIS, W/ REFLEX TO CULTURE (INFECTION SUSPECTED)
Bilirubin Urine: NEGATIVE
Glucose, UA: NEGATIVE mg/dL
Ketones, ur: NEGATIVE mg/dL
Nitrite: NEGATIVE
Protein, ur: 100 mg/dL — AB
Specific Gravity, Urine: 1.011 (ref 1.005–1.030)
WBC, UA: >50 WBC/hpf (ref 0–5)
pH: 6 (ref 5.0–8.0)

## 2023-05-31 LAB — CBC WITH DIFFERENTIAL/PLATELET
Abs Immature Granulocytes: 0.05 10*3/uL (ref 0.00–0.07)
Basophils Absolute: 0 10*3/uL (ref 0.0–0.1)
Basophils Relative: 0 %
Eosinophils Absolute: 0.1 10*3/uL (ref 0.0–0.5)
Eosinophils Relative: 1 %
HCT: 25.5 % — ABNORMAL LOW (ref 39.0–52.0)
Hemoglobin: 8.3 g/dL — ABNORMAL LOW (ref 13.0–17.0)
Immature Granulocytes: 1 %
Lymphocytes Relative: 13 %
Lymphs Abs: 1.2 10*3/uL (ref 0.7–4.0)
MCH: 27.4 pg (ref 26.0–34.0)
MCHC: 32.5 g/dL (ref 30.0–36.0)
MCV: 84.2 fL (ref 80.0–100.0)
Monocytes Absolute: 1.1 10*3/uL — ABNORMAL HIGH (ref 0.1–1.0)
Monocytes Relative: 12 %
Neutro Abs: 6.5 10*3/uL (ref 1.7–7.7)
Neutrophils Relative %: 73 %
Platelets: 299 10*3/uL (ref 150–400)
RBC: 3.03 MIL/uL — ABNORMAL LOW (ref 4.22–5.81)
RDW: 13.9 % (ref 11.5–15.5)
WBC: 8.9 10*3/uL (ref 4.0–10.5)
nRBC: 0 % (ref 0.0–0.2)

## 2023-05-31 LAB — I-STAT CHEM 8, ED
BUN: 39 mg/dL — ABNORMAL HIGH (ref 8–23)
Calcium, Ion: 1.26 mmol/L (ref 1.15–1.40)
Chloride: 107 mmol/L (ref 98–111)
Creatinine, Ser: 2.5 mg/dL — ABNORMAL HIGH (ref 0.61–1.24)
Glucose, Bld: 157 mg/dL — ABNORMAL HIGH (ref 70–99)
HCT: 25 % — ABNORMAL LOW (ref 39.0–52.0)
Hemoglobin: 8.5 g/dL — ABNORMAL LOW (ref 13.0–17.0)
Potassium: 4 mmol/L (ref 3.5–5.1)
Sodium: 136 mmol/L (ref 135–145)
TCO2: 19 mmol/L — ABNORMAL LOW (ref 22–32)

## 2023-05-31 LAB — TROPONIN I (HIGH SENSITIVITY): Troponin I (High Sensitivity): 8 ng/L (ref <18)

## 2023-05-31 LAB — D-DIMER, QUANTITATIVE: D-Dimer, Quant: 3 ug{FEU}/mL — ABNORMAL HIGH (ref 0.00–0.50)

## 2023-05-31 LAB — I-STAT CG4 LACTIC ACID, ED: Lactic Acid, Venous: 1.2 mmol/L (ref 0.5–1.9)

## 2023-05-31 MED ORDER — SODIUM CHLORIDE 0.9 % IV SOLN
1.0000 g | Freq: Once | INTRAVENOUS | Status: AC
  Administered 2023-06-01: 1 g via INTRAVENOUS
  Filled 2023-05-31: qty 10

## 2023-05-31 MED ORDER — LACTATED RINGERS IV BOLUS
1000.0000 mL | Freq: Once | INTRAVENOUS | Status: AC
  Administered 2023-05-31: 1000 mL via INTRAVENOUS

## 2023-05-31 NOTE — Assessment & Plan Note (Addendum)
 Cr elevated to 2.5 on arrival. Baseline appears around 1.0-1.2.  Suspect secondary to dehydration.  - S/p 1L LR bolus - consider redosing as indicated  - AM BMP

## 2023-05-31 NOTE — Assessment & Plan Note (Addendum)
 D-dimer 3.0. Unable to obtain CTPE due to kidney function. Satting well on RA with normal WOB. No reported chest pain, EKG without ischemic findings.  -  -

## 2023-05-31 NOTE — Assessment & Plan Note (Addendum)
 Hgb 8.3 on arrival. Baseline appears around 10-11. Recent workup with normal ferritin, low iron, elevated B12. Per chart review, patient's primary oncologist suggests these anemia panel findings may be inaccurate post-surgery and plans for future recheck.  - AM CBC - Transfuse Hgb <8 given hx of MI

## 2023-05-31 NOTE — Assessment & Plan Note (Addendum)
 Hx bladder/prostate cancer: S/p neoadjuvant ddMVAC and cystoprostatectomy. Urostomy in place.  HTN: Normotensive at this time. Home regimen includes: *** T2DM: Last A1c 6.6 in Feb 2025. Home regimen includes Mounjaro  12.5 weekly. ***

## 2023-05-31 NOTE — ED Provider Notes (Signed)
 [place] EMERGENCY DEPARTMENT AT [facility] Provider Note   CSN: [id number] Arrival date & time: 05/31/23  1957     History  Chief Complaint  Patient presents with   Near Syncope    Brad [name] is a 63 y.o. male.  Patient is a 63 year old male with a history of bladder cancer status post bladder and prostate removal with urostomy placement in February of this year.  He presents with lightheadedness.  He states he started feeling bad about 2 days ago with fatigue and lightheadedness.  He said that today he went to go to the movies with his wife and as they were trying to go and he felt like he might pass out and he got back in the car and went home.  As he was trying to get back in the house, he dropped down to the ground and was unable to get back up.  He denies any falls.  He just feels weak and dizzy.  He denies vertiginous type symptoms.  No chest pain or shortness of breath.  No leg swelling.  No abdominal pain.  No fevers.  No cough or cold symptoms.  No history of similar symptoms in the past.  He has completed all of his treatments.  No longer getting any treatment.  He still getting his full appetite back from the oncology treatments.       Home Medications Prior to Admission medications   Medication Sig Start Date End Date Taking? Authorizing Provider  acetaminophen  (TYLENOL ) 500 MG tablet Take 500 mg by mouth every 6 (six) hours as needed for moderate pain (pain score 4-6).    [provider]  amLODipine  (NORVASC ) 10 MG tablet Take 1 tablet (10 mg total) by mouth daily. 12/15/20   Jonn Nett, DO  blood glucose meter kit and supplies KIT Dispense based on patient and insurance preference. Check fasting blood sugar once daily ICD10 R73.03 10/11/16   Mathis Som, MD  chlorthalidone  (HYGROTON ) 25 MG tablet Take 1 tablet (25 mg total) by mouth daily. Patient taking differently: Take 25 mg by mouth daily as needed (fluid retention). 04/18/21   Jonn Nett, DO  Cholecalciferol (VITAMIN D3 MAXIMUM STRENGTH) 125 MCG (5000 UT) capsule Take 5,000 Units by mouth daily.    [provider]  gabapentin  (NEURONTIN ) 100 MG capsule Take 1 capsule (100 mg total) by mouth at bedtime for 3 days, THEN 2 capsules (200 mg total) at bedtime. Patient taking differently: Take 100 mg at bedtime 03/21/23 04/23/23  Lowanda Ruddy, MD  glucose blood test strip Use as instructed to monitor FSBS 1x daily. Dx: R73.09 06/07/17   Mathis Som, MD  ibuprofen (ADVIL) 800 MG tablet Take 400 mg by mouth every 6 (six) hours as needed. 03/12/23   [provider]  labetalol  (NORMODYNE ) 200 MG tablet TAKE 1 TABLET(200 MG) BY MOUTH TWICE DAILY 04/18/21   Jonn Nett, DO  Lancets [facility] ULTRASOFT) lancets Use as instructed 10/11/16   Mathis Som, MD  losartan  (COZAAR ) 100 MG tablet Take 1 tablet (100 mg total) by mouth daily. 12/15/20   Jonn Nett, DO  methocarbamol  (ROBAXIN ) 500 MG tablet Take 1 tablet (500 mg total) by mouth 2 (two) times daily. Patient taking differently: Take 500 mg by mouth 2 (two) times daily as needed for muscle spasms. 03/17/23   Juanna Norman, MD  Multiple Vitamins-Minerals (ONE-A-DAY MENS 50+ ADVANTAGE) TABS Take 1 tablet by mouth daily.    [provider]  oxyCODONE -acetaminophen  (PERCOCET/ROXICET) 5-325 MG tablet Take 1 tablet by mouth every 6 (six) hours as needed for severe pain (pain score 7-10) (post-operatively). 04/03/23   Melody Spurling., MD  tadalafil  (CIALIS ) 20 MG tablet TAKE 1 TABLET(20 MG) BY MOUTH DAILY AS NEEDED 03/01/21   Jonn Nett, DO      Allergies    Patient has no known allergies.    Review of Systems   Review of Systems  Constitutional:  Positive for fatigue. Negative for chills, diaphoresis and fever.  HENT:  Negative for congestion, rhinorrhea and sneezing.   Eyes: Negative.   Respiratory:  Negative for cough, chest tightness and shortness of breath.   Cardiovascular:   Negative for chest pain and leg swelling.  Gastrointestinal:  Negative for abdominal pain, blood in stool, diarrhea, nausea and vomiting.  Genitourinary:  Negative for difficulty urinating, flank pain, frequency and hematuria.  Musculoskeletal:  Negative for arthralgias and back pain.  Skin:  Negative for rash.  Neurological:  Positive for light-headedness. Negative for dizziness, speech difficulty, weakness, numbness and headaches.    Physical Exam Updated Vital Signs BP 113/76   Pulse 100   Temp 97.9 F (36.6 C) (Oral)   Resp (!) 21   SpO2 99%  Physical Exam Constitutional:      Appearance: He is well-developed.  HENT:     Head: Normocephalic and atraumatic.  Eyes:     Pupils: Pupils are equal, round, and reactive to light.  Cardiovascular:     Rate and Rhythm: Normal rate and regular rhythm.     Heart sounds: Normal heart sounds.  Pulmonary:     Effort: Pulmonary effort is normal. No respiratory distress.     Breath sounds: Normal breath sounds. No wheezing or rales.  Chest:     Chest wall: No tenderness.  Abdominal:     General: Bowel sounds are normal.     Palpations: Abdomen is soft.     Tenderness: There is no abdominal tenderness. There is no guarding or rebound.     Comments: Urostomy in place.  No tenderness or swelling around it.  Musculoskeletal:        General: Normal range of motion.     Cervical back: Normal range of motion and neck supple.  Lymphadenopathy:     Cervical: No cervical adenopathy.  Skin:    General: Skin is warm and dry.     Findings: No rash.  Neurological:     General: No focal deficit present.     Mental Status: He is alert and oriented to person, place, and time.     ED Results / Procedures / Treatments   Labs (all labs ordered are listed, but only abnormal results are displayed) Labs Reviewed  COMPREHENSIVE METABOLIC PANEL WITH GFR - Abnormal; Notable for the following components:      Result Value   Sodium 131 (*)    CO2 18  (*)    Glucose, Bld 171 (*)    BUN 41 (*)    Creatinine, Ser 2.52 (*)    Albumin 3.1 (*)    GFR, Estimated 28 (*)    All other components within normal limits  CBC WITH DIFFERENTIAL/PLATELET - Abnormal; Notable for the following components:   RBC 3.03 (*)    Hemoglobin 8.3 (*)    HCT 25.5 (*)    Monocytes Absolute 1.1 (*)    All other components within normal limits  D-DIMER, QUANTITATIVE - Abnormal; Notable for the following components:  D-Dimer, Quant 3.00 (*)    All other components within normal limits  URINALYSIS, W/ REFLEX TO CULTURE (INFECTION SUSPECTED) - Abnormal; Notable for the following components:   Color, Urine AMBER (*)    APPearance CLOUDY (*)    Hgb urine dipstick MODERATE (*)    Protein, ur 100 (*)    Leukocytes,Ua LARGE (*)    Bacteria, UA MANY (*)    All other components within normal limits  I-STAT CHEM 8, ED - Abnormal; Notable for the following components:   BUN 39 (*)    Creatinine, Ser 2.50 (*)    Glucose, Bld 157 (*)    TCO2 19 (*)    Hemoglobin 8.5 (*)    HCT 25.0 (*)    All other components within normal limits  URINE CULTURE  I-STAT CG4 LACTIC ACID, ED  TROPONIN I (HIGH SENSITIVITY)    EKG EKG Interpretation Date/Time:  Friday May 31 2023 20:57:55 EDT Ventricular Rate:  92 PR Interval:    QRS Duration:  91 QT Interval:  353 QTC Calculation: 437 R Axis:   -11  Text Interpretation: Atrial fibrillation Low voltage, extremity leads Confirmed by Hershel Los [phone]) on 05/31/2023 9:01:30 PM  Radiology No results found.  Procedures Ultrasound ED Echo  Date/Time: 05/31/2023 10:41 PM  Performed by: Hershel Los, MD Authorized by: Hershel Los, MD   Procedure details:    Indications: syncope     Views: subxiphoid, parasternal long axis view and apical 4 chamber view     Images: archived   Findings:    Pericardium: no pericardial effusion     LV Function: normal (>50% EF)     RV Diameter: normal   Impression:    Impression:  normal       Medications Ordered in ED Medications  cefTRIAXone  (ROCEPHIN ) 1 g in sodium chloride  0.9 % 100 mL IVPB (has no administration in time range)  lactated ringers  bolus 1,000 mL (1,000 mLs Intravenous New Bag/Given 05/31/23 2058)    ED Course/ Medical Decision Making/ A&P                                 Medical Decision Making Amount and/or Complexity of Data Reviewed Labs: ordered.   Patient is a 63 year old male who had a recent diagnosis of bladder cancer.  He completed radiation and chemotherapy in December.  He is status post bladder and prostate removal with urostomy in place.  He comes in with lightheadedness and 2 near syncopal events today.  He is noted to be in A-fib with RVR on his EKG.  This is a new diagnosis.  No known history of atrial fibrillation.  His rate is controlled.  His labs show anemia.  On chart review, it looks like it has been trending down over the last couple of months.  He recently had some anemia labs drawn.  It does not look is concerning for iron deficiency anemia/blood loss.  He does not report any melena or hematochezia.  He does not reporting associated abdominal pain.  He also has evidence of an AKI with a creatinine of 2.5 which is an increase from his last values which were around 1.4.  His D-dimer was elevated.  However he does not have any chest pain or shortness of breath.  We are unable to do a CT angiogram due to his renal disease.  I did do a bedside ultrasound which does not show any  evidence of right heart strain.  I would suspect if he had a large enough PE to cause a near syncopal event, he would have some evidence of right heart strain on echo.  Unable to get a VQ scan tonight.  Do not feel suspicious enough for PE to start anticoagulation tonight given his anemia.  His urine has suggestions of infection.  It was sent for culture.  He was started on IV Rocephin .  His lactate was normal.  He does not have other suggestions of sepsis.  Will  plan admission.  Discussed with the family medicine residents who will admit the patient for further treatment.  Final Clinical Impression(s) / ED Diagnoses Final diagnoses:  Near syncope  New onset atrial fibrillation (HCC)  Anemia, unspecified type  AKI (acute kidney injury) [facility])    Rx / DC Orders ED Discharge Orders     None         Hershel Los, MD 05/31/23 2242

## 2023-05-31 NOTE — Assessment & Plan Note (Addendum)
 New diagnosis of Afib, rate controlled. EKG without ischemic changes, negative troponin. CHADSVASC score of 4, will anticoagulate.  - Heparin  per pharmacy  - Echo ordered  - TSH ordered  - Continuous cardiac monitoring  - AM CBC, BMP

## 2023-05-31 NOTE — ED Triage Notes (Signed)
 BIBA Per EMS pt c/o 2 near syncopal episodes. States when he stands he feels lightheaded x 4 days. Hx bladder cancer.  138/86 78 HR  216 CBG

## 2023-05-31 NOTE — Hospital Course (Signed)
 Recent bladder cx tx Bladder and prostate removed, ostomy placed in January More fatigued and lightheaded with 2 near syncopal episodes today  No CP, SOB, fevers  On arrival AKI Downtrending Hgb, normal iron/b12/folate, no GI bleeding UA suggestive of UTI D dimer up, can't get cta bc Cr, bedside US  without R heart strain  Did not start anticoagulation   Pending orthostatics

## 2023-05-31 NOTE — H&P (Shared)
 Hospital Admission History and Physical Service Pager: [phone]  Patient name: Brad [name] Medical record number: [id number] Date of Birth: 1960/09/28 Age: 63 y.o. Gender: male  Primary Care Provider: Genora Kidd, MD Consultants: N/a Code Status: Full Code Preferred Emergency Contact: Wife Contact Information     Name Relation Home Work Mobile   Iafrate,Crystal Spouse [phone]  [phone]      Other Contacts   None on File     Chief Complaint: Near syncope  Assessment and Plan: Brad [name] is a 63 y.o. male presenting with near syncope. Differential for presentation of this includes:   Orthostatics: Hypotension on arrival. Symptomatic with postural change.  Cardiac: New diagnosis Afib, rate controlled. EKG without ischemia, negative troponin.  Vasovagal:  No prolonged standing or distress reported prior to event.  PE: No evidence of R heart strain on ED bedside US .  Assessment & Plan Near syncope Unclear etiology. Concern for orthostatics given hypotension on arrival and symptoms with postural change. Patient also has new diagnosis of Afib.  - Admit to FMTS with attending Dr Andree Kayser  - S/p 1L LR bolus - consider redosing as indicated  - Orthostatic vitals pending  - Continuous cardiac monitoring  - Vitals per floor  - PT/OT eval and treat - AM CBC, BMP Atrial fibrillation (HCC) New diagnosis of Afib, rate controlled. EKG without ischemic changes, negative troponin. CHADSVASC score of 4, will anticoagulate.  - Heparin  per pharmacy  - Echo ordered  - TSH ordered  - Continuous cardiac monitoring  - AM CBC, BMP  UTI (urinary tract infection) UA suggestive of UTI. Patient with ostomy in place s/p cystoprostatectomy, thus asymptomatic.   - S/p CTX in ED - redose daily  - Urine cx pending  - AM CBC, BMP AKI (acute kidney injury) (HCC) Cr elevated to 2.5 on arrival. Baseline appears around 1.0-1.2.  Suspect secondary to dehydration.  - S/p 1L LR bolus -  consider redosing as indicated  - AM BMP Anemia Hgb 8.3 on arrival. Baseline appears around 10-11. Recent workup with normal ferritin, low iron, elevated B12. Per chart review, patient's primary oncologist suggests these anemia panel findings may be inaccurate post-surgery and plans for future recheck.  - AM CBC - Transfuse Hgb <8 given hx of MI Elevated d-dimer D-dimer 3.0. Unable to obtain CTPE due to kidney function. Satting well on RA with normal WOB. No reported chest pain or SOB. EKG without ischemic findings.  - Heparin  per pharmacy given new Afib  - Continuous cardiac monitoring  - Consider CTPE vs V/Q scan as indicated  Chronic health problem Hx bladder/prostate cancer: S/p neoadjuvant ddMVAC and cystoprostatectomy. Urostomy in place.  HTN: Low to normotensive at this time, will hold home BP meds at this time. Consider restarting home meds as indicated.  T2DM: Last A1c 6.6 in Feb 2025. Home regimen includes Mounjaro  12.5 weekly. Will monitor CBGs AC HS.  OSA: On CPAP   FEN/GI: Carb modified diet VTE Prophylaxis: Heparin   Disposition: Med-tele  History of Present Illness:  Brad [name] is a 63 y.o. male presenting with presyncope.   For the last couple of days he's felt light headed and dizzy. Today he went to the movies w/ his wife - upon getting out of the car, he felt dizzy/light headed and fell to one knee. They decided to go home, and he had issues getting out of the car at home as well, again falling to one knee. No head injury, no LOC. Never had anything  like this before. No N/V/D. No HA, CP, or SOB. No recent fever, cough, congestion. No new bruising, coughing up blood, or bloody/black stools.   In the ED, patient found to be hypotensive with new diagnosis of Afib. Initial workup included Hgb 8.3, Cr 2.52, D-dimer 3.0, and UA suggestive of UTI. Patient given CTX and fluid bolus.   Review Of Systems: Per HPI   Pertinent Past Medical History: Bladder cancer s/p ddMVAC  and cystoprostatectomy  T2DM Hx MI 2016 Hx Stroke 2016 (no deficits) HTN Sleep apnea  Remainder reviewed in history tab.   Pertinent Past Surgical History: Cystoprostatectomy (03/29/23) Cholecystectomy Appendectomy   Remainder reviewed in history tab.   Pertinent Social History: Tobacco use: Former stopped in 2016, smoked for 20 years, 1.5 ppd Alcohol use: None Other Substance use: None Lives with Wife  Pertinent Family History: Mother: MI age 12, HTN Brother: HTN Paternal grandmother: Aneurysm  Remainder reviewed in history tab.   Important Outpatient Medications: Amlodipine  10 daily  Chlorthalidone  25 daily PRN for swelling Gabapentin  200 nightly  Labetalol  200 daily  Losartan  100 daily  Robaxin  500 BID prn  Oxycodone -acetaminophen  5-325 q6 prn for post op pain Tadalafil  20 daily prn  Monjaro on Mondays Remainder reviewed in medication history.   Objective: BP 113/76   Pulse 100   Temp 97.9 F (36.6 C) (Oral)   Resp (!) 21   SpO2 99%  Exam: General: Well-appearing. Resting comfortably in room. CV: Irregular rhythm, controlled rate. No S3/S4. Warm and well-perfused. Pulm: Breathing comfortably on room air. CTAB. No increased WOB. Abd: Soft, non-tender, non-distended. Ostomy in place.  Skin/Ext:  Warm, dry. No extremity swelling. Cap refill <2 s.  Psych: Pleasant and appropriate.   Labs:  CBC BMET  Recent Labs  Lab 05/31/23 2044 05/31/23 2055  WBC 8.9  --   HGB 8.3* 8.5*  HCT 25.5* 25.0*  PLT 299  --    Recent Labs  Lab 05/31/23 2044 05/31/23 2055  NA 131* 136  K 3.7 4.0  CL 102 107  CO2 18*  --   BUN 41* 39*  CREATININE 2.52* 2.50*  GLUCOSE 171* 157*  CALCIUM  9.2  --      Lactic Acid, Venous    Component Value Date/Time   LATICACIDVEN 1.2 05/31/2023 2056    Troponin: 8  Ddimer: 3.0  Urinalysis    Component Value Date/Time   COLORURINE AMBER (A) 05/31/2023 2044   APPEARANCEUR CLOUDY (A) 05/31/2023 2044   LABSPEC 1.011  05/31/2023 2044   PHURINE 6.0 05/31/2023 2044   GLUCOSEU NEGATIVE 05/31/2023 2044   HGBUR MODERATE (A) 05/31/2023 2044   BILIRUBINUR NEGATIVE 05/31/2023 2044   BILIRUBINUR small (A) 08/27/2022 1130   KETONESUR NEGATIVE 05/31/2023 2044   PROTEINUR 100 (A) 05/31/2023 2044   UROBILINOGEN 1.0 08/27/2022 1130   NITRITE NEGATIVE 05/31/2023 2044   LEUKOCYTESUR LARGE (A) 05/31/2023 2044    EKG: Afib. No STE.   Imaging Studies Performed: ED bedside US : No evidence of R heart strain   Carey Chapman, MD 05/31/2023, 10:23 PM PGY-1, [facility]  FPTS Intern pager: [phone], text pages welcome Secure chat group [facility] Teaching Service   Upper Level Addendum: I have seen and evaluated this patient along with Dr. Fernand Howard and reviewed the above note, making necessary revisions as appropriate. I agree with the medical decision making and physical exam as noted above. Wilhemena Harbour, MD PGY-3 [facility] Family Medicine Residency

## 2023-05-31 NOTE — Assessment & Plan Note (Addendum)
 UA suggestive of UTI. Patient with ostomy in place s/p cystoprostatectomy, thus asymptomatic.   - S/p CTX in ED - redose daily  - Urine cx pending  - AM CBC, BMP

## 2023-06-01 ENCOUNTER — Inpatient Hospital Stay (HOSPITAL_COMMUNITY)

## 2023-06-01 ENCOUNTER — Encounter (HOSPITAL_COMMUNITY): Payer: Self-pay | Admitting: Family Medicine

## 2023-06-01 ENCOUNTER — Other Ambulatory Visit: Payer: Self-pay

## 2023-06-01 DIAGNOSIS — N179 Acute kidney failure, unspecified: Secondary | ICD-10-CM | POA: Diagnosis present

## 2023-06-01 DIAGNOSIS — G4733 Obstructive sleep apnea (adult) (pediatric): Secondary | ICD-10-CM | POA: Diagnosis present

## 2023-06-01 DIAGNOSIS — Z9889 Other specified postprocedural states: Secondary | ICD-10-CM | POA: Diagnosis not present

## 2023-06-01 DIAGNOSIS — I480 Paroxysmal atrial fibrillation: Secondary | ICD-10-CM | POA: Diagnosis not present

## 2023-06-01 DIAGNOSIS — Z923 Personal history of irradiation: Secondary | ICD-10-CM | POA: Diagnosis not present

## 2023-06-01 DIAGNOSIS — I4891 Unspecified atrial fibrillation: Secondary | ICD-10-CM | POA: Diagnosis present

## 2023-06-01 DIAGNOSIS — R7989 Other specified abnormal findings of blood chemistry: Secondary | ICD-10-CM | POA: Diagnosis not present

## 2023-06-01 DIAGNOSIS — M25561 Pain in right knee: Secondary | ICD-10-CM | POA: Diagnosis present

## 2023-06-01 DIAGNOSIS — I252 Old myocardial infarction: Secondary | ICD-10-CM | POA: Diagnosis not present

## 2023-06-01 DIAGNOSIS — I10 Essential (primary) hypertension: Secondary | ICD-10-CM | POA: Diagnosis present

## 2023-06-01 DIAGNOSIS — Z8551 Personal history of malignant neoplasm of bladder: Secondary | ICD-10-CM | POA: Diagnosis not present

## 2023-06-01 DIAGNOSIS — R296 Repeated falls: Secondary | ICD-10-CM | POA: Diagnosis present

## 2023-06-01 DIAGNOSIS — Z936 Other artificial openings of urinary tract status: Secondary | ICD-10-CM | POA: Diagnosis not present

## 2023-06-01 DIAGNOSIS — I959 Hypotension, unspecified: Secondary | ICD-10-CM | POA: Diagnosis present

## 2023-06-01 DIAGNOSIS — Z9221 Personal history of antineoplastic chemotherapy: Secondary | ICD-10-CM | POA: Diagnosis not present

## 2023-06-01 DIAGNOSIS — R55 Syncope and collapse: Secondary | ICD-10-CM | POA: Diagnosis not present

## 2023-06-01 DIAGNOSIS — E119 Type 2 diabetes mellitus without complications: Secondary | ICD-10-CM | POA: Diagnosis present

## 2023-06-01 DIAGNOSIS — D509 Iron deficiency anemia, unspecified: Secondary | ICD-10-CM | POA: Diagnosis present

## 2023-06-01 DIAGNOSIS — M25562 Pain in left knee: Secondary | ICD-10-CM | POA: Diagnosis present

## 2023-06-01 DIAGNOSIS — Z79899 Other long term (current) drug therapy: Secondary | ICD-10-CM | POA: Diagnosis not present

## 2023-06-01 DIAGNOSIS — Z8673 Personal history of transient ischemic attack (TIA), and cerebral infarction without residual deficits: Secondary | ICD-10-CM | POA: Diagnosis not present

## 2023-06-01 DIAGNOSIS — Z8546 Personal history of malignant neoplasm of prostate: Secondary | ICD-10-CM | POA: Diagnosis not present

## 2023-06-01 DIAGNOSIS — Z87891 Personal history of nicotine dependence: Secondary | ICD-10-CM | POA: Diagnosis not present

## 2023-06-01 LAB — GLUCOSE, CAPILLARY
Glucose-Capillary: 156 mg/dL — ABNORMAL HIGH (ref 70–99)
Glucose-Capillary: 136 mg/dL — ABNORMAL HIGH (ref 70–99)
Glucose-Capillary: 125 mg/dL — ABNORMAL HIGH (ref 70–99)

## 2023-06-01 LAB — CBC
HCT: 22.6 % — ABNORMAL LOW (ref 39.0–52.0)
Hemoglobin: 7.4 g/dL — ABNORMAL LOW (ref 13.0–17.0)
MCH: 27.5 pg (ref 26.0–34.0)
MCHC: 32.7 g/dL (ref 30.0–36.0)
MCV: 84 fL (ref 80.0–100.0)
Platelets: 268 10*3/uL (ref 150–400)
RBC: 2.69 MIL/uL — ABNORMAL LOW (ref 4.22–5.81)
RDW: 14 % (ref 11.5–15.5)
WBC: 9.8 10*3/uL (ref 4.0–10.5)
nRBC: 0 % (ref 0.0–0.2)

## 2023-06-01 LAB — ECHOCARDIOGRAM COMPLETE
AR max vel: 2.92 cm2
AV Area VTI: 2.93 cm2
AV Area mean vel: 2.58 cm2
AV Mean grad: 4.7 mmHg
AV Peak grad: 8.1 mmHg
Ao pk vel: 1.42 m/s
Height: 81 in
S' Lateral: 3.4 cm
Weight: 4306.91 [oz_av]

## 2023-06-01 LAB — HEMOGLOBIN AND HEMATOCRIT, BLOOD
HCT: 27.3 % — ABNORMAL LOW (ref 39.0–52.0)
Hemoglobin: 9.1 g/dL — ABNORMAL LOW (ref 13.0–17.0)

## 2023-06-01 LAB — PREPARE RBC (CROSSMATCH)

## 2023-06-01 LAB — BASIC METABOLIC PANEL WITH GFR
Anion gap: 11 (ref 5–15)
BUN: 38 mg/dL — ABNORMAL HIGH (ref 8–23)
CO2: 19 mmol/L — ABNORMAL LOW (ref 22–32)
Calcium: 9 mg/dL (ref 8.9–10.3)
Chloride: 106 mmol/L (ref 98–111)
Creatinine, Ser: 2.05 mg/dL — ABNORMAL HIGH (ref 0.61–1.24)
GFR, Estimated: 36 mL/min — ABNORMAL LOW (ref >60)
Glucose, Bld: 135 mg/dL — ABNORMAL HIGH (ref 70–99)
Potassium: 3.9 mmol/L (ref 3.5–5.1)
Sodium: 136 mmol/L (ref 135–145)

## 2023-06-01 LAB — TSH: TSH: 1.217 u[IU]/mL (ref 0.350–4.500)

## 2023-06-01 LAB — HEPARIN LEVEL (UNFRACTIONATED)
Heparin Unfractionated: 0.27 [IU]/mL — ABNORMAL LOW (ref 0.30–0.70)
Heparin Unfractionated: 0.64 [IU]/mL (ref 0.30–0.70)

## 2023-06-01 MED ORDER — SODIUM CHLORIDE 0.9 % IV SOLN: 1.0000 g | INTRAVENOUS | Status: DC

## 2023-06-01 MED ORDER — LACTATED RINGERS IV BOLUS
1000.0000 mL | Freq: Once | INTRAVENOUS | Status: AC
  Administered 2023-06-01: 1000 mL via INTRAVENOUS

## 2023-06-01 MED ORDER — SODIUM CHLORIDE 0.9% FLUSH
10.0000 mL | Freq: Two times a day (BID) | INTRAVENOUS | Status: DC
  Administered 2023-06-01 – 2023-06-04 (×6): 10 mL

## 2023-06-01 MED ORDER — ORAL CARE MOUTH RINSE: 15.0000 mL | OROMUCOSAL | Status: DC | PRN

## 2023-06-01 MED ORDER — SODIUM CHLORIDE 0.9% FLUSH: 10.0000 mL | INTRAVENOUS | Status: DC | PRN

## 2023-06-01 MED ORDER — HEPARIN BOLUS VIA INFUSION
1800.0000 [IU] | Freq: Once | INTRAVENOUS | Status: AC
  Administered 2023-06-01: 1800 [IU] via INTRAVENOUS
  Filled 2023-06-01: qty 1800

## 2023-06-01 MED ORDER — DICLOFENAC SODIUM 1 % EX GEL
4.0000 g | Freq: Four times a day (QID) | CUTANEOUS | Status: DC | PRN
  Filled 2023-06-01: qty 100

## 2023-06-01 MED ORDER — SODIUM CHLORIDE 0.9 % IV SOLN
1.0000 g | Freq: Every day | INTRAVENOUS | Status: DC
  Administered 2023-06-01: 1 g via INTRAVENOUS
  Filled 2023-06-01 (×2): qty 10

## 2023-06-01 MED ORDER — CHLORHEXIDINE GLUCONATE CLOTH 2 % EX PADS
6.0000 | MEDICATED_PAD | Freq: Every day | CUTANEOUS | Status: DC
  Administered 2023-06-01 – 2023-06-04 (×4): 6 via TOPICAL

## 2023-06-01 MED ORDER — HEPARIN (PORCINE) 25000 UT/250ML-% IV SOLN
1950.0000 [IU]/h | INTRAVENOUS | Status: DC
  Administered 2023-06-01 (×2): 2000 [IU]/h via INTRAVENOUS
  Administered 2023-06-01: 1800 [IU]/h via INTRAVENOUS
  Administered 2023-06-02 – 2023-06-04 (×3): 2050 [IU]/h via INTRAVENOUS
  Filled 2023-06-01 (×6): qty 250

## 2023-06-01 MED ORDER — ACETAMINOPHEN 325 MG PO TABS
650.0000 mg | ORAL_TABLET | Freq: Four times a day (QID) | ORAL | Status: DC | PRN
  Administered 2023-06-01 (×2): 650 mg via ORAL
  Filled 2023-06-01 (×2): qty 2

## 2023-06-01 MED ORDER — LIDOCAINE 5 % EX PTCH
1.0000 | MEDICATED_PATCH | CUTANEOUS | Status: DC
  Administered 2023-06-01 (×2): 1 via TRANSDERMAL
  Filled 2023-06-01 (×2): qty 1

## 2023-06-01 MED ORDER — LIDOCAINE 5 % EX PTCH
1.0000 | MEDICATED_PATCH | Freq: Every day | CUTANEOUS | Status: DC
  Administered 2023-06-01: 1 via TRANSDERMAL
  Filled 2023-06-01 (×2): qty 1

## 2023-06-01 MED ORDER — SODIUM CHLORIDE 0.9% IV SOLUTION: Freq: Once | INTRAVENOUS | Status: AC

## 2023-06-01 NOTE — Plan of Care (Signed)
   Problem: Education: Goal: Knowledge of General Education information will improve Description Including pain rating scale, medication(s)/side effects and non-pharmacologic comfort measures Outcome: Progressing

## 2023-06-01 NOTE — Assessment & Plan Note (Addendum)
 Hx bladder/prostate cancer: S/p neoadjuvant ddMVAC and cystoprostatectomy. Urostomy in place.  HTN: Remains low to normotensive at this time, will continue to hold home BP meds at this time. Consider restarting home meds as indicated.  T2DM: Last A1c 6.6 in Feb 2025. Home regimen includes Mounjaro  12.5 weekly. Will monitor CBGs AC HS.  OSA: Cont home CPAP nightly

## 2023-06-01 NOTE — Progress Notes (Signed)
 New Admission Note:  Arrival Method: Stretcher Mental Orientation: Alert and oriented x 4 Telemetry: Box 08 Assessment: Completed Skin: Warm and dry IV: Port Pain: Denies Tubes: Urostomy Safety Measures: Safety Fall Prevention Plan initiated.  Admission: Completed 5 M  Orientation: Patient has been orientated to the room, unit and the staff. Welcome booklet given.  Family: wife   Orders have been reviewed and implemented. Will continue to monitor the patient. Call light has been placed within reach and bed alarm has been activated.   Woodward Head BSN, RN  Phone Number: [phone]

## 2023-06-01 NOTE — Progress Notes (Signed)
 PHARMACY - ANTICOAGULATION CONSULT NOTE  Pharmacy Consult for heparin  Indication: atrial fibrillation  No Known Allergies  Patient Measurements:    Vital Signs: Temp: 98.9 F (37.2 C) (05/03 0001) Temp Source: Oral (05/03 0001) BP: 89/66 (05/03 0030) Pulse Rate: 76 (05/03 0045)  Labs: Recent Labs    05/31/23 2044 05/31/23 2055  HGB 8.3* 8.5*  HCT 25.5* 25.0*  PLT 299  --   CREATININE 2.52* 2.50*  TROPONINIHS 8  --     CrCl cannot be calculated (Unknown ideal weight.).   Medical History: Past Medical History:  Diagnosis Date   Abnormal ultrasound of carotid artery 04/2014   no significant obstruction   Ataxia 04/2014   Cancer (HCC)    Diabetes (HCC)    EKG abnormalities 04/2014   poor R wave progression, NSR   Former smoker    25 years x 1.5 ppd, stopped 04/2014   Gait disturbance 04/2014   s/p stroke   H/O echocardiogram 04/2014   moderate LVH, 60-65% EF, no valve disease   History of heart attack    Hypertension    Hypertensive heart disease 04/2014   Intraparenchymal hemorrhage of brain [facility]) 05/05/2014   hospitalization [facility]   Myocardial infarction [facility])    Obesity    Prediabetes    Short-term memory loss 04/2014   Sleep apnea    Stroke (HCC) 04/2014    Medications:  No current facility-administered medications on file prior to encounter.   Current Outpatient Medications on File Prior to Encounter  Medication Sig Dispense Refill   acetaminophen  (TYLENOL ) 500 MG tablet Take 500 mg by mouth every 6 (six) hours as needed for moderate pain (pain score 4-6).     amLODipine  (NORVASC ) 10 MG tablet Take 1 tablet (10 mg total) by mouth daily. 90 tablet 3   blood glucose meter kit and supplies KIT Dispense based on patient and insurance preference. Check fasting blood sugar once daily ICD10 R73.03 1 each 0   chlorthalidone  (HYGROTON ) 25 MG tablet Take 1 tablet (25 mg total) by mouth daily. (Patient taking differently: Take 25 mg by  mouth daily as needed (fluid retention).) 30 tablet 2   Cholecalciferol (VITAMIN D3 MAXIMUM STRENGTH) 125 MCG (5000 UT) capsule Take 5,000 Units by mouth daily.     gabapentin  (NEURONTIN ) 100 MG capsule Take 1 capsule (100 mg total) by mouth at bedtime for 3 days, THEN 2 capsules (200 mg total) at bedtime. (Patient taking differently: Take 100 mg at bedtime) 63 capsule 0   glucose blood test strip Use as instructed to monitor FSBS 1x daily. Dx: R73.09 100 each 12   ibuprofen (ADVIL) 800 MG tablet Take 400 mg by mouth every 6 (six) hours as needed.     labetalol  (NORMODYNE ) 200 MG tablet TAKE 1 TABLET(200 MG) BY MOUTH TWICE DAILY 180 tablet 3   Lancets (ONETOUCH ULTRASOFT) lancets Use as instructed 100 each 12   losartan  (COZAAR ) 100 MG tablet Take 1 tablet (100 mg total) by mouth daily. 90 tablet 3   methocarbamol  (ROBAXIN ) 500 MG tablet Take 1 tablet (500 mg total) by mouth 2 (two) times daily. (Patient taking differently: Take 500 mg by mouth 2 (two) times daily as needed for muscle spasms.) 20 tablet 0   Multiple Vitamins-Minerals (ONE-A-DAY MENS 50+ ADVANTAGE) TABS Take 1 tablet by mouth daily.     oxyCODONE -acetaminophen  (PERCOCET/ROXICET) 5-325 MG tablet Take 1 tablet by mouth every 6 (six) hours as needed for severe pain (pain score 7-10) (post-operatively).  10 tablet 0   tadalafil  (CIALIS ) 20 MG tablet TAKE 1 TABLET(20 MG) BY MOUTH DAILY AS NEEDED 30 tablet 3     Assessment: 63 y.o. male admitted with syncope, new onset Afib, for heparin    Goal of Therapy:  Heparin  level 0.3-0.7 units/ml Monitor platelets by anticoagulation protocol: Yes   Plan:  Start heparin  1800 units/hr Check heparin  level in 8 hours.   Carlota Chestnut 06/01/2023,12:56 AM

## 2023-06-01 NOTE — Assessment & Plan Note (Addendum)
 Concern for orthostatics vs symptomatic anemia. Patient also has new diagnosis of Afib. Reports some residual knee pain from near syncope episode.  - S/p 2L LR bolus with improving BP  - consider redosing as indicated  - Orthostatic vitals pending  - Continuous cardiac monitoring  - Lidocaine  patch for knee as needed  - PT/OT eval and treat - AM CBC, BMP

## 2023-06-01 NOTE — Assessment & Plan Note (Addendum)
 UA suggestive of UTI. Patient with ostomy in place s/p cystoprostatectomy, thus asymptomatic.  Patient remains afebrile without leukocytosis.  - Cont daily CTX - transition to PO abx as indicated  - Urine cx pending  - AM CBC, BMP

## 2023-06-01 NOTE — Progress Notes (Signed)
 Echocardiogram 2D Echocardiogram has been performed.  Harli Engelken N Achol Azpeitia,RDCS 06/01/2023, 2:28 PM

## 2023-06-01 NOTE — Assessment & Plan Note (Signed)
 New diagnosis of Afib, remains rate controlled on tele. TSH unremarkable.  - Cont heparin  per pharmacy  - Echo ordered  - Needs cardiology follow up outpatient set up

## 2023-06-01 NOTE — Assessment & Plan Note (Addendum)
 Unclear etiology. Concern for orthostatics given hypotension on arrival and symptoms with postural change. Patient also has new diagnosis of Afib.  - Admit to FMTS with attending Dr Andree Kayser  - S/p 1L LR bolus - consider redosing as indicated  - Orthostatic vitals pending  - Continuous cardiac monitoring  - Vitals per floor  - PT/OT eval and treat - AM CBC, BMP

## 2023-06-01 NOTE — Assessment & Plan Note (Signed)
 Improving  - AM BMP

## 2023-06-01 NOTE — Assessment & Plan Note (Addendum)
 Hgb 8.3 on arrival, now downtrending to 7.4. Baseline appears around 10-11. Recent workup with normal ferritin, low iron, elevated B12. Per chart review, patient's primary oncologist suggests these anemia panel findings may be inaccurate post-surgery and plans for future recheck.  - Discussed with patient and ordered 1u PRBC - Post transfusion H&H - AM CBC - Transfuse threshold Hgb <8 given hx of MI

## 2023-06-01 NOTE — Progress Notes (Signed)
 PHARMACY - ANTICOAGULATION CONSULT NOTE  Pharmacy Consult for heparin  Indication: atrial fibrillation  No Known Allergies  Patient Measurements: Height: 6\' 9"  (205.7 cm) Weight: 122.1 kg (269 lb 2.9 oz) IBW/kg (Calculated) : 98.3 HEPARIN  DW (KG): 122.1  Vital Signs: Temp: 97.9 F (36.6 C) (05/03 1434) Temp Source: Oral (05/03 1434) BP: 112/71 (05/03 1434)  Labs: Recent Labs    05/31/23 2044 05/31/23 2055 06/01/23 0438 06/01/23 0536 06/01/23 0925 06/01/23 1926  HGB 8.3* 8.5*  --  7.4*  --  9.1*  HCT 25.5* 25.0*  --  22.6*  --  27.3*  PLT 299  --   --  268  --   --   HEPARINUNFRC  --   --   --   --  0.27* 0.64  CREATININE 2.52* 2.50* 2.05*  --   --   --   TROPONINIHS 8  --   --   --   --   --     Estimated Creatinine Clearance: 57.7 mL/min (A) (by C-G formula based on SCr of 2.05 mg/dL (H)).   Medical History: Past Medical History:  Diagnosis Date   Abnormal ultrasound of carotid artery 04/2014   no significant obstruction   Ataxia 04/2014   Cancer (HCC)    Diabetes (HCC)    EKG abnormalities 04/2014   poor R wave progression, NSR   Former smoker    25 years x 1.5 ppd, stopped 04/2014   Gait disturbance 04/2014   s/p stroke   H/O echocardiogram 04/2014   moderate LVH, 60-65% EF, no valve disease   History of heart attack    Hypertension    Hypertensive heart disease 04/2014   Intraparenchymal hemorrhage of brain [facility]) 05/05/2014   hospitalization [facility]   Myocardial infarction [facility])    Obesity    Prediabetes    Short-term memory loss 04/2014   Sleep apnea    Stroke (HCC) 04/2014    Medications:  No current facility-administered medications on file prior to encounter.   Current Outpatient Medications on File Prior to Encounter  Medication Sig Dispense Refill   acetaminophen  (TYLENOL ) 500 MG tablet Take 500 mg by mouth every 6 (six) hours as needed for moderate pain (pain score 4-6).     amLODipine  (NORVASC ) 10 MG tablet Take 1  tablet (10 mg total) by mouth daily. 90 tablet 3   chlorthalidone  (HYGROTON ) 25 MG tablet Take 1 tablet (25 mg total) by mouth daily. (Patient taking differently: Take 25 mg by mouth daily as needed (fluid retention).) 30 tablet 2   Cholecalciferol (VITAMIN D3 MAXIMUM STRENGTH) 125 MCG (5000 UT) capsule Take 5,000 Units by mouth daily.     gabapentin  (NEURONTIN ) 100 MG capsule Take 1 capsule (100 mg total) by mouth at bedtime for 3 days, THEN 2 capsules (200 mg total) at bedtime. (Patient taking differently: Take 100 mg at bedtime) 63 capsule 0   ibuprofen (ADVIL) 800 MG tablet Take 400 mg by mouth every 6 (six) hours as needed.     labetalol  (NORMODYNE ) 200 MG tablet TAKE 1 TABLET(200 MG) BY MOUTH TWICE DAILY 180 tablet 3   losartan  (COZAAR ) 100 MG tablet Take 1 tablet (100 mg total) by mouth daily. 90 tablet 3   magnesium  chloride (SLOW-MAG) 64 MG TBEC SR tablet Take 1 tablet by mouth daily.     MOUNJARO  12.5 MG/0.5ML Pen Inject 12.5 mg into the skin once a week. On Monday     Multiple Vitamins-Minerals (ONE-A-DAY MENS 50+ ADVANTAGE)  TABS Take 1 tablet by mouth daily.     blood glucose meter kit and supplies KIT Dispense based on patient and insurance preference. Check fasting blood sugar once daily ICD10 R73.03 1 each 0   glucose blood test strip Use as instructed to monitor FSBS 1x daily. Dx: R73.09 100 each 12   Lancets (ONETOUCH ULTRASOFT) lancets Use as instructed 100 each 12   [DISCONTINUED] methocarbamol  (ROBAXIN ) 500 MG tablet Take 1 tablet (500 mg total) by mouth 2 (two) times daily. (Patient not taking: Reported on 06/01/2023) 20 tablet 0   [DISCONTINUED] oxyCODONE -acetaminophen  (PERCOCET/ROXICET) 5-325 MG tablet Take 1 tablet by mouth every 6 (six) hours as needed for severe pain (pain score 7-10) (post-operatively). (Patient not taking: Reported on 06/01/2023) 10 tablet 0   [DISCONTINUED] tadalafil  (CIALIS ) 20 MG tablet TAKE 1 TABLET(20 MG) BY MOUTH DAILY AS NEEDED (Patient not taking:  Reported on 06/01/2023) 30 tablet 3     Assessment: 63 y.o. male admitted with syncope, new onset Afib, for heparin .  Heparin  level therapeutic following rate increase to 2000 units/hr.  No issues noted.  Will follow-up with confirmatory level with AM labs.    Goal of Therapy:  Heparin  level 0.3-0.7 units/ml Monitor platelets by anticoagulation protocol: Yes  Plan:  Continue heparin  at 2000 units/hr Daily heparin  level and CBC  Jeryn Bertoni, Pharm.D., BCPS Clinical Pharmacist  **Pharmacist phone directory can be found on [email or web address] listed under [facility].  06/01/2023 8:52 PM

## 2023-06-01 NOTE — Assessment & Plan Note (Addendum)
 New diagnosis of Afib, remains rate controlled on tele. EKG without ischemic changes, negative troponin. CHADSVASC score of 4. TSH unremarkable.  - Cont heparin  per pharmacy  - Echo ordered  - Continuous cardiac monitoring  - AM CBC, BMP

## 2023-06-01 NOTE — Progress Notes (Signed)
 Daily Progress Note Intern Pager: [phone]  Patient name: Brad [name] Medical record number: [id number] Date of birth: Jul 07, 1960 Age: 63 y.o. Gender: male  Primary Care Provider: Genora Kidd, MD Consultants: N/a Code Status: Full code  Pt Overview and Major Events to Date:  5/3: Admitted  5/3: 1 u PRBCs ordered   Assessment and Plan:  Brad [name] 63 y.o. M hx of T2DM, HTN, bladder/prostate cancer, OSA admitted for near syncope. Concern for orthostatics vs symptomatic anemia. Requiring blood transfusion for decline in Hgb.  Assessment & Plan Near syncope Concern for orthostatics vs symptomatic anemia. Patient also has new diagnosis of Afib. Reports some residual knee pain from near syncope episode.  - S/p 2L LR bolus with improving BP  - consider redosing as indicated  - Orthostatic vitals pending  - Continuous cardiac monitoring  - Lidocaine  patch for knee as needed  - PT/OT eval and treat - AM CBC, BMP Atrial fibrillation (HCC) New diagnosis of Afib, remains rate controlled on tele. EKG without ischemic changes, negative troponin. CHADSVASC score of 4. TSH unremarkable.  - Cont heparin  per pharmacy  - Echo ordered  - Continuous cardiac monitoring  - AM CBC, BMP  UTI (urinary tract infection) UA suggestive of UTI. Patient with ostomy in place s/p cystoprostatectomy, thus asymptomatic.  Patient remains afebrile without leukocytosis.  - Cont daily CTX - transition to PO abx as indicated  - Urine cx pending  - AM CBC, BMP AKI (acute kidney injury) (HCC) Cr improving 2.5>2.0 today. Baseline appears around 1.0-1.2. Suspect secondary to dehydration.  - S/p 2L LR bolus - consider redosing as indicated  - AM BMP Anemia Hgb 8.3 on arrival, now downtrending to 7.4. Baseline appears around 10-11. Recent workup with normal ferritin, low iron, elevated B12. Per chart review, patient's primary oncologist suggests these anemia panel findings may be inaccurate post-surgery and  plans for future recheck.  - Discussed with patient and ordered 1u PRBC - Post transfusion H&H - AM CBC - Transfuse threshold Hgb <8 given hx of MI Elevated d-dimer D-dimer 3.0 on arrival. Unable to obtain CTPE due to kidney function. ED bedside US  without R heart strain. EKG without ischemic findings. Continues to have no chest pain or SOB. Lower concern for PE at this time.  - Heparin  per pharmacy given new Afib  - Continuous cardiac monitoring  Chronic health problem Hx bladder/prostate cancer: S/p neoadjuvant ddMVAC and cystoprostatectomy. Urostomy in place.  HTN: Remains low to normotensive at this time, will continue to hold home BP meds at this time. Consider restarting home meds as indicated.  T2DM: Last A1c 6.6 in Feb 2025. Home regimen includes Mounjaro  12.5 weekly. Will monitor CBGs AC HS.  OSA: Cont home CPAP nightly   FEN/GI: Reg diet PPx: Heparin   Dispo: Home pending clinical improvement   Subjective:  Reports doing well this morning. No dizziness or lightheadedness. No CP or SOB. No active bleeding symptoms. Reports R knee soreness from when he landed on it during presyncope episodes. Is able to walk on it. No extremity swelling.   Objective: Temp:  [97.7 F (36.5 C)-98.9 F (37.2 C)] 98.1 F (36.7 C) (05/03 0804) Pulse Rate:  [65-108] 97 (05/03 0804) Resp:  [14-33] 16 (05/03 0436) BP: (82-113)/(49-79) 97/69 (05/03 0804) SpO2:  [96 %-100 %] 100 % (05/03 0804) FiO2 (%):  [21 %] 21 % (05/03 0212) Weight:  [122.1 kg] 122.1 kg (05/03 9629) Physical Exam: General: Well-appearing. Resting comfortably in room. CV: irregular rhythm,  rate controlled. No extra heart sounds. Warm and well-perfused. Pulm: Breathing comfortably on room air. CTAB anteriorly. No increased WOB. Abd: Soft, non-tender, non-distended. Ostomy in place.  Skin:  Warm, dry. No extremity edema.  MSK: Nontender to palpation of knees bilaterally, no surrounding patellar edema or fluctuance. No posterior  knee edema or tenderness bilaterally. Full ROM of L leg.  Psych: Pleasant and appropriate.   Laboratory: Most recent CBC Lab Results  Component Value Date   WBC 9.8 06/01/2023   HGB 7.4 (L) 06/01/2023   HCT 22.6 (L) 06/01/2023   MCV 84.0 06/01/2023   PLT 268 06/01/2023   Most recent BMP    Latest Ref Rng & Units 06/01/2023    4:38 AM  BMP  Glucose 70 - 99 mg/dL 161   BUN 8 - 23 mg/dL 38   Creatinine 0.96 - 1.24 mg/dL 0.45   Sodium 409 - 811 mmol/L 136   Potassium 3.5 - 5.1 mmol/L 3.9   Chloride 98 - 111 mmol/L 106   CO2 22 - 32 mmol/L 19   Calcium  8.9 - 10.3 mg/dL 9.0     Lab Results  Component Value Date   TSH 1.217 06/01/2023    Carey Chapman, MD 06/01/2023, 8:52 AM  PGY-1, [place] [facility] FPTS Intern pager: [phone], text pages welcome Secure chat group [facility] Teaching Service

## 2023-06-01 NOTE — Assessment & Plan Note (Addendum)
 D-dimer 3.0 on arrival. Unable to obtain CTPE due to kidney function. ED bedside US  without R heart strain. EKG without ischemic findings. Continues to have no chest pain or SOB. Lower concern for PE at this time.  - Heparin  per pharmacy given new Afib  - Continuous cardiac monitoring

## 2023-06-01 NOTE — Assessment & Plan Note (Signed)
 UA negative for acute infection.  - DC CTX s/p one dose 5/2 - Urine cx pending

## 2023-06-01 NOTE — TOC CM/SW Note (Signed)
 Transition of Care [facility]) - Inpatient Brief Assessment   Patient Details  Name: Brad [name] MRN: [id number] Date of Birth: [birth year]  [facility] of [facility]) CM/SW Contact:    Jannice Mends, LCSW Phone Number: 06/01/2023, 11:41 AM   Clinical Narrative: Patient admitted from home with spouse, typically independent. [facility] was set up in March for his Urostomy. No current TOC needs identified at this time but please place consult if needs arise.    Transition of Care Asessment: Insurance and Status: Insurance coverage has been reviewed Patient has primary care physician: Yes Home environment has been reviewed: From home Prior level of function:: Independent Prior/Current Home Services:  (Had [facility] in March) Social Drivers of Health Review: SDOH reviewed no interventions necessary Readmission risk has been reviewed: Yes Transition of care needs: no transition of care needs at this time

## 2023-06-01 NOTE — Progress Notes (Signed)
 Notified Family medicine teaching services team that patient is requesting tylenol .

## 2023-06-01 NOTE — Assessment & Plan Note (Signed)
 Hx bladder/prostate cancer: S/p neoadjuvant ddMVAC and cystoprostatectomy. Urostomy in place.  HTN: Remains low to normotensive at this time, will continue to hold home BP meds at this time. Consider restarting home meds as indicated.  T2DM: Last A1c 6.6 in Feb 2025. Home regimen includes Mounjaro  12.5 weekly. Will monitor CBGs AC HS.  OSA: Cont home CPAP nightly

## 2023-06-01 NOTE — Progress Notes (Addendum)
 PHARMACY - ANTICOAGULATION CONSULT NOTE  Pharmacy Consult for heparin  Indication: atrial fibrillation  No Known Allergies  Patient Measurements: Height: 6\' 9"  (205.7 cm) Weight: 122.1 kg (269 lb 2.9 oz) IBW/kg (Calculated) : 98.3 HEPARIN  DW (KG): 122.1  Vital Signs: Temp: 98.1 F (36.7 C) (05/03 0804) Temp Source: Oral (05/03 0804) BP: 97/69 (05/03 0804) Pulse Rate: 97 (05/03 0804)  Labs: Recent Labs    05/31/23 2044 05/31/23 2055 06/01/23 0438 06/01/23 0536 06/01/23 0925  HGB 8.3* 8.5*  --  7.4*  --   HCT 25.5* 25.0*  --  22.6*  --   PLT 299  --   --  268  --   HEPARINUNFRC  --   --   --   --  0.27*  CREATININE 2.52* 2.50* 2.05*  --   --   TROPONINIHS 8  --   --   --   --     Estimated Creatinine Clearance: 57.7 mL/min (A) (by C-G formula based on SCr of 2.05 mg/dL (H)).   Medical History: Past Medical History:  Diagnosis Date   Abnormal ultrasound of carotid artery 04/2014   no significant obstruction   Ataxia 04/2014   Cancer (HCC)    Diabetes (HCC)    EKG abnormalities 04/2014   poor R wave progression, NSR   Former smoker    25 years x 1.5 ppd, stopped 04/2014   Gait disturbance 04/2014   s/p stroke   H/O echocardiogram 04/2014   moderate LVH, 60-65% EF, no valve disease   History of heart attack    Hypertension    Hypertensive heart disease 04/2014   Intraparenchymal hemorrhage of brain [facility]) 05/05/2014   hospitalization [facility]   Myocardial infarction [facility])    Obesity    Prediabetes    Short-term memory loss 04/2014   Sleep apnea    Stroke (HCC) 04/2014    Medications:  No current facility-administered medications on file prior to encounter.   Current Outpatient Medications on File Prior to Encounter  Medication Sig Dispense Refill   acetaminophen  (TYLENOL ) 500 MG tablet Take 500 mg by mouth every 6 (six) hours as needed for moderate pain (pain score 4-6).     amLODipine  (NORVASC ) 10 MG tablet Take 1 tablet (10 mg  total) by mouth daily. 90 tablet 3   chlorthalidone  (HYGROTON ) 25 MG tablet Take 1 tablet (25 mg total) by mouth daily. (Patient taking differently: Take 25 mg by mouth daily as needed (fluid retention).) 30 tablet 2   Cholecalciferol (VITAMIN D3 MAXIMUM STRENGTH) 125 MCG (5000 UT) capsule Take 5,000 Units by mouth daily.     gabapentin  (NEURONTIN ) 100 MG capsule Take 1 capsule (100 mg total) by mouth at bedtime for 3 days, THEN 2 capsules (200 mg total) at bedtime. (Patient taking differently: Take 100 mg at bedtime) 63 capsule 0   ibuprofen (ADVIL) 800 MG tablet Take 400 mg by mouth every 6 (six) hours as needed.     labetalol  (NORMODYNE ) 200 MG tablet TAKE 1 TABLET(200 MG) BY MOUTH TWICE DAILY 180 tablet 3   losartan  (COZAAR ) 100 MG tablet Take 1 tablet (100 mg total) by mouth daily. 90 tablet 3   magnesium  chloride (SLOW-MAG) 64 MG TBEC SR tablet Take 1 tablet by mouth daily.     MOUNJARO  12.5 MG/0.5ML Pen Inject 12.5 mg into the skin once a week. On Monday     Multiple Vitamins-Minerals (ONE-A-DAY MENS 50+ ADVANTAGE) TABS Take 1 tablet by mouth daily.  blood glucose meter kit and supplies KIT Dispense based on patient and insurance preference. Check fasting blood sugar once daily ICD10 R73.03 1 each 0   glucose blood test strip Use as instructed to monitor FSBS 1x daily. Dx: R73.09 100 each 12   Lancets (ONETOUCH ULTRASOFT) lancets Use as instructed 100 each 12   [DISCONTINUED] methocarbamol  (ROBAXIN ) 500 MG tablet Take 1 tablet (500 mg total) by mouth 2 (two) times daily. (Patient not taking: Reported on 06/01/2023) 20 tablet 0   [DISCONTINUED] oxyCODONE -acetaminophen  (PERCOCET/ROXICET) 5-325 MG tablet Take 1 tablet by mouth every 6 (six) hours as needed for severe pain (pain score 7-10) (post-operatively). (Patient not taking: Reported on 06/01/2023) 10 tablet 0   [DISCONTINUED] tadalafil  (CIALIS ) 20 MG tablet TAKE 1 TABLET(20 MG) BY MOUTH DAILY AS NEEDED (Patient not taking: Reported on  06/01/2023) 30 tablet 3     Assessment: 63 y.o. male admitted with syncope, new onset Afib, for heparin .  Heparin  level subtherapeutic at 0.27. >8 hour level. Note Hgb decrease from 8.5 to 7.4, PLT stable at 268. Will increase dose and recheck 8hr level.   Goal of Therapy:  Heparin  level 0.3-0.7 units/ml Monitor platelets by anticoagulation protocol: Yes  Plan:  Heparin  1800 unit bolus Increase to heparin  2000 units/hr Check heparin  level in 8 hours.   Jennett Model Miguel Christiana 06/01/2023,10:39 AM

## 2023-06-01 NOTE — Evaluation (Signed)
 Physical Therapy Evaluation Patient Details Name: Brad [name] MRN: [id number] DOB: 07-31-1960 Today's Date: 06/01/2023  History of Present Illness  The pt is a 63 yo male presenting 5/2 after 2 near syncopal episodes. Admitted for medical management, work up revealed pt hypotensive upon arrival, new dx of afib, UA suggestive of UTI, and AKI. PMH includes: bladder cancer, DM II, MI in 2016, CVA in 2016, HTN, OSA.   Clinical Impression  Pt in bed upon arrival of PT, agreeable to evaluation at this time. Prior to admission the pt was independent without use of DME, living in a house with his wife, and working as an Teaching laboratory technician. The pt was able to complete bed mobility without assistance but needed increased steadying assist or UE support with OOB mobility due to pain in bilateral knees which resulted in one instance of buckling. The pt's BP remained soft but stable with all changes in position. Will continue to benefit from skilled PT acutely to progress functional activity tolerance, ROM in knees, and independence with transfers and mobility. Pt will also need to complete stair training prior to anticipated d/c home as his bedroom/bathroom is on 2nd floor. I anticipate the pt will progress well with improved pain control in bilateral knees.   VITALS:  - supine in bed- BP: 92/70 (77); HR: 60bpm - sitting EOB - BP: 114/95 (103); HR: 113bpm - standing - BP: 90/70 (77); HR: 127bpm - standing after 3 min - BP: 89/68 (77); HR: 100bpm Pt reports asymptomatic throughout         If plan is discharge home, recommend the following: A little help with walking and/or transfers;A little help with bathing/dressing/bathroom;Supervision due to cognitive status   Can travel by private vehicle        Equipment Recommendations Rolling walker (2 wheels) (tall (pt is 6\' 9" ))  Recommendations for Other Services       Functional Status Assessment Patient has had a recent decline in their functional status  and demonstrates the ability to make significant improvements in function in a reasonable and predictable amount of time.     Precautions / Restrictions Precautions Precautions: Fall Recall of Precautions/Restrictions: Intact Restrictions Weight Bearing Restrictions Per Provider Order: No      Mobility  Bed Mobility Overal bed mobility: Modified Independent             General bed mobility comments: increased time, used rails    Transfers Overall transfer level: Needs assistance Equipment used: 1 person hand held assist, Rolling walker (2 wheels) Transfers: Sit to/from Stand Sit to Stand: Min assist, Contact guard assist           General transfer comment: assist to steady, reaching for UE support in standing due to pain in bilateral knees    Ambulation/Gait Ambulation/Gait assistance: Min assist, Contact guard assist Gait Distance (Feet): 5 Feet Assistive device: Rolling walker (2 wheels), IV Pole Gait Pattern/deviations: Step-through pattern, Decreased stride length Gait velocity: decreased     General Gait Details: pt initially standing without UE support, able to manage small lateral steps but with L knee buckling, then holding IV pole to step laterally. limited by knee pain, even with RW. was able to step forwards and back without buckling when using RW but declined further ambulaiton due to pain     Balance Overall balance assessment: Needs assistance Sitting-balance support: No upper extremity supported Sitting balance-Leahy Scale: Good     Standing balance support: Bilateral upper extremity supported, During functional activity Standing  balance-Leahy Scale: Fair Standing balance comment: can static stand and step wihtout DME but poor tolerance on BLE due to pain, improved balance with BUE support                             Pertinent Vitals/Pain Pain Assessment Pain Assessment: Faces Faces Pain Scale: Hurts even more Pain Location:  bilateral knees with wt bearing Pain Descriptors / Indicators: Discomfort, Grimacing, Sore Pain Intervention(s): Limited activity within patient's tolerance, Monitored during session, Repositioned, Ice applied    Home Living Family/patient expects to be discharged to:: Private residence Living Arrangements: Spouse/significant other Available Help at Discharge: Family Type of Home: House Home Access: Level entry     Alternate Level Stairs-Number of Steps: flight Home Layout: Two level;Bed/bath upstairs;1/2 bath on main level Home Equipment: None      Prior Function Prior Level of Function : Independent/Modified Independent;Driving             Mobility Comments: no other falls, attends gym regularly for cardio ADLs Comments: independent, working as Teaching laboratory technician     Extremity/Trunk Assessment   Upper Extremity Assessment Upper Extremity Assessment: Overall WFL for tasks assessed    Lower Extremity Assessment Lower Extremity Assessment: RLE deficits/detail;LLE deficits/detail RLE Deficits / Details: knee pain with standing, grossly 5/5, ROM within functional limits RLE: Unable to fully assess due to pain RLE Sensation: WNL RLE Coordination: WNL LLE Deficits / Details: knee pain with standing, grossly 5/5, ROM within functional limits LLE: Unable to fully assess due to pain LLE Sensation: WNL LLE Coordination: WNL    Cervical / Trunk Assessment Cervical / Trunk Assessment: Normal  Communication   Communication Communication: No apparent difficulties    Cognition Arousal: Alert Behavior During Therapy: WFL for tasks assessed/performed   PT - Cognitive impairments: No apparent impairments                         Following commands: Intact       Cueing Cueing Techniques: Verbal cues     General Comments General comments (skin integrity, edema, etc.): BP soft but stable with orthostatics    Exercises General Exercises - Lower Extremity Quad  Sets: AROM, Both, 5 reps Short Arc Quad: AROM, Both, 5 reps Heel Slides: AROM, Both, 5 reps   Assessment/Plan    PT Assessment Patient needs continued PT services  PT Problem List Decreased activity tolerance;Decreased balance;Decreased mobility;Pain       PT Treatment Interventions DME instruction;Gait training;Therapeutic activities;Functional mobility training;Stair training;Therapeutic exercise;Balance training;Patient/family education    PT Goals (Current goals can be found in the Care Plan section)  Acute Rehab PT Goals Patient Stated Goal: return home and to gym PT Goal Formulation: With patient Time For Goal Achievement: 06/15/23 Potential to Achieve Goals: Good    Frequency Min 2X/week        AM-PAC PT "6 Clicks" Mobility  Outcome Measure Help needed turning from your back to your side while in a flat bed without using bedrails?: None Help needed moving from lying on your back to sitting on the side of a flat bed without using bedrails?: None Help needed moving to and from a bed to a chair (including a wheelchair)?: A Little Help needed standing up from a chair using your arms (e.g., wheelchair or bedside chair)?: A Little Help needed to walk in hospital room?: A Little Help needed climbing 3-5 steps with a railing? :  A Lot 6 Click Score: 19    End of Session Equipment Utilized During Treatment: Gait belt Activity Tolerance: Patient limited by pain Patient left: in bed;with call bell/phone within reach (echo coming) Nurse Communication: Mobility status PT Visit Diagnosis: Unsteadiness on feet (R26.81);Muscle weakness (generalized) (M62.81);Pain Pain - Right/Left:  (bilateral) Pain - part of body: Knee    Time: 1246-1316 PT Time Calculation (min) (ACUTE ONLY): 30 min   Charges:   PT Evaluation $PT Eval Moderate Complexity: 1 Mod PT Treatments $Therapeutic Exercise: 8-22 mins PT General Charges $$ ACUTE PT VISIT: 1 Visit         Barnabas Booth, PT, DPT    Acute Rehabilitation Department Office [phone] Secure Chat Communication Preferred  Lona Rist 06/01/2023, 1:48 PM

## 2023-06-01 NOTE — Assessment & Plan Note (Signed)
 No signs of bleeding per patient. Concerning due to starting anticoagulation.  - Discussed with patient and ordered 1u PRBC - Post transfusion H&H - AM CBC - Transfuse threshold Hgb <8 given hx of MI

## 2023-06-01 NOTE — Assessment & Plan Note (Addendum)
 Cr improving 2.5>2.0 today. Baseline appears around 1.0-1.2. Suspect secondary to dehydration.  - S/p 2L LR bolus - consider redosing as indicated  - AM BMP

## 2023-06-01 NOTE — Assessment & Plan Note (Signed)
 D-dimer 3.0 on arrival. Possibly contributing to picture of pre-syncope.  - Order DVT ultrasound bilaterally  - If DVT ultrasounds negative order VQ scan  - Heparin  per pharmacy given new Afib  - Continuous cardiac monitoring

## 2023-06-01 NOTE — Plan of Care (Signed)
 FMTS Interim Progress Note  S:Evaluated patient at bedside. He is doing well with no acute complaints. Denies continued pre-syncopal events.   O: BP (!) 107/58   Pulse 97   Temp 97.9 F (36.6 C) (Oral)   Resp 18   Ht 6\' 9"  (2.057 m)   Wt 122.1 kg   SpO2 100%   BMI 28.85 kg/m   Well appearing, NAD  No increased WOB Cardio: regular rate, irregularly irregular rhythm   Assessment & Plan Near syncope Differential includes: new onset afib, anemia, stroke 2/2 to a fib, PE  - CT head wo contrast to r/o stroke in the setting of new a fib not on anticoagulation  - PT/OT eval and treat - AM CBC, BMP Elevated d-dimer D-dimer 3.0 on arrival. Possibly contributing to picture of pre-syncope.  - Order DVT ultrasound bilaterally  - If DVT ultrasounds negative order VQ scan  - Heparin  per pharmacy given new Afib  - Continuous cardiac monitoring  Atrial fibrillation (HCC) New diagnosis of Afib, remains rate controlled on tele. TSH unremarkable.  - Cont heparin  per pharmacy  - Echo ordered  - Needs cardiology follow up outpatient set up  Anemia No signs of bleeding per patient. Concerning due to starting anticoagulation.  - Discussed with patient and ordered 1u PRBC - Post transfusion H&H - AM CBC - Transfuse threshold Hgb <8 given hx of MI History of urostomy UA negative for acute infection.  - DC CTX s/p one dose 5/2 - Urine cx pending  AKI (acute kidney injury) (HCC) Improving  - AM BMP Chronic health problem Hx bladder/prostate cancer: S/p neoadjuvant ddMVAC and cystoprostatectomy. Urostomy in place.  HTN: Remains low to normotensive at this time, will continue to hold home BP meds at this time. Consider restarting home meds as indicated.  T2DM: Last A1c 6.6 in Feb 2025. Home regimen includes Mounjaro  12.5 weekly. Will monitor CBGs AC HS.  OSA: Cont home CPAP nightly     Clem Currier, DO 06/01/2023, 11:45 AM PGY-2, [facility] Service pager [phone]

## 2023-06-01 NOTE — Assessment & Plan Note (Signed)
 Differential includes: new onset afib, anemia, stroke 2/2 to a fib, PE  - CT head wo contrast to r/o stroke in the setting of new a fib not on anticoagulation  - PT/OT eval and treat - AM CBC, BMP

## 2023-06-02 DIAGNOSIS — I480 Paroxysmal atrial fibrillation: Secondary | ICD-10-CM | POA: Diagnosis not present

## 2023-06-02 DIAGNOSIS — R55 Syncope and collapse: Secondary | ICD-10-CM | POA: Diagnosis not present

## 2023-06-02 DIAGNOSIS — Z9889 Other specified postprocedural states: Secondary | ICD-10-CM | POA: Diagnosis not present

## 2023-06-02 DIAGNOSIS — R7989 Other specified abnormal findings of blood chemistry: Secondary | ICD-10-CM

## 2023-06-02 LAB — CBC
HCT: 24.4 % — ABNORMAL LOW (ref 39.0–52.0)
HCT: 26.7 % — ABNORMAL LOW (ref 39.0–52.0)
Hemoglobin: 8.2 g/dL — ABNORMAL LOW (ref 13.0–17.0)
Hemoglobin: 8.8 g/dL — ABNORMAL LOW (ref 13.0–17.0)
MCH: 28.4 pg (ref 26.0–34.0)
MCH: 28.2 pg (ref 26.0–34.0)
MCHC: 33.6 g/dL (ref 30.0–36.0)
MCHC: 33 g/dL (ref 30.0–36.0)
MCV: 84.4 fL (ref 80.0–100.0)
MCV: 85.6 fL (ref 80.0–100.0)
Platelets: 262 10*3/uL (ref 150–400)
Platelets: 308 10*3/uL (ref 150–400)
RBC: 2.89 MIL/uL — ABNORMAL LOW (ref 4.22–5.81)
RBC: 3.12 MIL/uL — ABNORMAL LOW (ref 4.22–5.81)
RDW: 14 % (ref 11.5–15.5)
RDW: 14.1 % (ref 11.5–15.5)
WBC: 8.6 10*3/uL (ref 4.0–10.5)
WBC: 8.3 10*3/uL (ref 4.0–10.5)
nRBC: 0 % (ref 0.0–0.2)
nRBC: 0 % (ref 0.0–0.2)

## 2023-06-02 LAB — BASIC METABOLIC PANEL WITH GFR
Anion gap: 7 (ref 5–15)
BUN: 28 mg/dL — ABNORMAL HIGH (ref 8–23)
CO2: 22 mmol/L (ref 22–32)
Calcium: 8.9 mg/dL (ref 8.9–10.3)
Chloride: 110 mmol/L (ref 98–111)
Creatinine, Ser: 1.45 mg/dL — ABNORMAL HIGH (ref 0.61–1.24)
GFR, Estimated: 55 mL/min — ABNORMAL LOW (ref >60)
Glucose, Bld: 112 mg/dL — ABNORMAL HIGH (ref 70–99)
Potassium: 3.8 mmol/L (ref 3.5–5.1)
Sodium: 139 mmol/L (ref 135–145)

## 2023-06-02 LAB — GLUCOSE, CAPILLARY
Glucose-Capillary: 158 mg/dL — ABNORMAL HIGH (ref 70–99)
Glucose-Capillary: 107 mg/dL — ABNORMAL HIGH (ref 70–99)
Glucose-Capillary: 99 mg/dL (ref 70–99)

## 2023-06-02 LAB — HEPARIN LEVEL (UNFRACTIONATED): Heparin Unfractionated: 0.55 [IU]/mL (ref 0.30–0.70)

## 2023-06-02 MED ORDER — LIDOCAINE 5 % EX PTCH
1.0000 | MEDICATED_PATCH | CUTANEOUS | Status: DC
  Administered 2023-06-02 – 2023-06-04 (×3): 1 via TRANSDERMAL
  Filled 2023-06-02 (×3): qty 1

## 2023-06-02 MED ORDER — LIDOCAINE 5 % EX PTCH
1.0000 | MEDICATED_PATCH | CUTANEOUS | Status: DC
  Administered 2023-06-02 – 2023-06-04 (×3): 1 via TRANSDERMAL
  Filled 2023-06-02 (×2): qty 1

## 2023-06-02 NOTE — Assessment & Plan Note (Addendum)
 Hx bladder/prostate cancer: S/p neoadjuvant ddMVAC and cystoprostatectomy. Urostomy in place.  HTN: Remains low to normotensive at this time, will continue to hold home BP meds at this time. Consider restarting home meds as indicated.  T2DM: Last A1c 6.6 in Feb 2025. Home regimen includes Mounjaro  12.5 weekly. Will monitor CBGs AC HS.  OSA: Cont home CPAP nightly. Knee pain: Bilateral, chronic; worsened by recent falls.  Lidocaine  patches PRN.

## 2023-06-02 NOTE — Evaluation (Signed)
 Occupational Therapy Evaluation Patient Details Name: Brad [name] MRN: [id number] DOB: February 22, 1960 Today's Date: 06/02/2023   History of Present Illness   The pt is a 63 yo male presenting 5/2 after 2 near syncopal episodes. Admitted for medical management, work up revealed pt hypotensive upon arrival, new dx of afib, UA suggestive of UTI, and AKI. PMH includes: bladder cancer, DM II, MI in 2016, CVA in 2016, HTN, OSA.     Clinical Impressions Pt reports ind at baseline with ADLs and functional mobility, lives with spouse. Pt currently needing up to min A for ADLs, and CGA -min A for transfers with RW. Pt denies orthostatic symptoms and BP stable (see below), however HR 120-150bpm with short distance mobility, further mobility deferred. Pt presenting with impairments listed below, will follow acutely. Recommend HHOT at d/c pending progression.   BP seated EOB 140/80 (92) BP standing 124/93 (104) BP seated post-ambulation 116/79 (90)      If plan is discharge home, recommend the following:   A little help with walking and/or transfers;A little help with bathing/dressing/bathroom;Assistance with cooking/housework;Assist for transportation;Help with stairs or ramp for entrance;Supervision due to cognitive status     Functional Status Assessment   Patient has had a recent decline in their functional status and demonstrates the ability to make significant improvements in function in a reasonable and predictable amount of time.     Equipment Recommendations   Tub/shower seat;Other (comment) (RW)     Recommendations for Other Services   PT consult     Precautions/Restrictions   Precautions Precautions: Fall Recall of Precautions/Restrictions: Intact Precaution/Restrictions Comments: watch HR and BP Restrictions Weight Bearing Restrictions Per Provider Order: No     Mobility Bed Mobility               General bed mobility comments: EOB upon arrival and  departure    Transfers Overall transfer level: Needs assistance Equipment used: Rolling walker (2 wheels) Transfers: Sit to/from Stand Sit to Stand: Min assist, Contact guard assist                  Balance Overall balance assessment: Needs assistance Sitting-balance support: No upper extremity supported Sitting balance-Leahy Scale: Good     Standing balance support: Bilateral upper extremity supported, During functional activity Standing balance-Leahy Scale: Fair Standing balance comment: can static stand and step wihtout DME but poor tolerance on BLE due to pain, improved balance with BUE support                           ADL either performed or assessed with clinical judgement   ADL Overall ADL's : Needs assistance/impaired Eating/Feeding: Set up;Sitting   Grooming: Set up;Sitting   Upper Body Bathing: Minimal assistance;Sitting   Lower Body Bathing: Sitting/lateral leans;Minimal assistance   Upper Body Dressing : Minimal assistance;Sitting   Lower Body Dressing: Minimal assistance;Sitting/lateral leans   Toilet Transfer: Minimal assistance;Ambulation;Rolling walker (2 wheels)   Toileting- Clothing Manipulation and Hygiene: Maximal assistance Toileting - Clothing Manipulation Details (indicate cue type and reason): catheter     Functional mobility during ADLs: Maximal assistance       Vision   Vision Assessment?: No apparent visual deficits     Perception Perception: Not tested       Praxis Praxis: Not tested       Pertinent Vitals/Pain Pain Assessment Pain Assessment: Faces Pain Score: 6  Faces Pain Scale: Hurts even more Pain Location: bilateral knees with  wt bearing Pain Descriptors / Indicators: Discomfort, Grimacing, Sore Pain Intervention(s): Limited activity within patient's tolerance, Monitored during session, Repositioned     Extremity/Trunk Assessment Upper Extremity Assessment Upper Extremity Assessment: Overall WFL  for tasks assessed   Lower Extremity Assessment Lower Extremity Assessment: Defer to PT evaluation   Cervical / Trunk Assessment Cervical / Trunk Assessment: Normal   Communication Communication Communication: No apparent difficulties   Cognition Arousal: Alert Behavior During Therapy: WFL for tasks assessed/performed Cognition: No apparent impairments                               Following commands: Intact       Cueing  General Comments   Cueing Techniques: Verbal cues  BP stable, HR 120-140s with ambulation   Exercises     Shoulder Instructions      Home Living Family/patient expects to be discharged to:: Private residence Living Arrangements: Spouse/significant other Available Help at Discharge: Family Type of Home: House Home Access: Level entry     Home Layout: Two level;Bed/bath upstairs;1/2 bath on main level Alternate Level Stairs-Number of Steps: flight Alternate Level Stairs-Rails: Left;Right;Can reach both Bathroom Shower/Tub: Chief Strategy Officer: Standard     Home Equipment: None          Prior Functioning/Environment Prior Level of Function : Independent/Modified Independent;Driving             Mobility Comments: no other falls, attends gym regularly for cardio ADLs Comments: independent, working as Land Problem List: Decreased strength;Decreased range of motion;Decreased activity tolerance;Impaired balance (sitting and/or standing);Decreased safety awareness;Cardiopulmonary status limiting activity   OT Treatment/Interventions: Self-care/ADL training;Therapeutic exercise;Energy conservation;DME and/or AE instruction;Therapeutic activities;Patient/family education;Balance training      OT Goals(Current goals can be found in the care plan section)   Acute Rehab OT Goals Patient Stated Goal: to walk OT Goal Formulation: With patient Time For Goal Achievement: 06/16/23 Potential to  Achieve Goals: Good ADL Goals Pt Will Perform Upper Body Dressing: with modified independence;sitting Pt Will Perform Lower Body Dressing: with modified independence;sitting/lateral leans;sit to/from stand Pt Will Transfer to Toilet: with modified independence;ambulating;regular height toilet Pt Will Perform Tub/Shower Transfer: Tub transfer;Shower transfer;with modified independence;ambulating Additional ADL Goal #1: pt will tolerate OOB activity x10 min with VSS in order to improve activity tolerance for ADLs   OT Frequency:  Min 2X/week    Co-evaluation              AM-PAC OT "6 Clicks" Daily Activity     Outcome Measure Help from another person eating meals?: A Little Help from another person taking care of personal grooming?: A Little Help from another person toileting, which includes using toliet, bedpan, or urinal?: A Little Help from another person bathing (including washing, rinsing, drying)?: A Little Help from another person to put on and taking off regular upper body clothing?: A Little Help from another person to put on and taking off regular lower body clothing?: A Little 6 Click Score: 18   End of Session Equipment Utilized During Treatment: Gait belt;Rolling walker (2 wheels) Nurse Communication: Mobility status  Activity Tolerance: Patient tolerated treatment well Patient left: in bed;with call bell/phone within reach;with bed alarm set;with family/visitor present  OT Visit Diagnosis: Unsteadiness on feet (R26.81);Other abnormalities of gait and mobility (R26.89);Muscle weakness (generalized) (M62.81)                Time:  4098-1191 OT Time Calculation (min): 32 min Charges:  OT General Charges $OT Visit: 1 Visit OT Evaluation $OT Eval Moderate Complexity: 1 Mod OT Treatments $Therapeutic Activity: 8-22 mins  Samariya Rockhold K, OTD, OTR/L SecureChat Preferred Acute Rehab [phone]   Benedict Brain Koonce 06/02/2023, 3:58 PM

## 2023-06-02 NOTE — Progress Notes (Signed)
   06/02/23 2343  BiPAP/CPAP/SIPAP  $ Non-Invasive Ventilator  Non-Invasive Vent Set Up  BiPAP/CPAP/SIPAP Pt Type Adult  BiPAP/CPAP/SIPAP Resmed  Mask Type Nasal mask  Mask Size Medium  Respiratory Rate 16 breaths/min  FiO2 (%) 21 %  Flow Rate 0 lpm  Patient Home Machine No  Patient Home Mask No  Patient Home Tubing No  Auto Titrate Yes  CPAP/SIPAP surface wiped down Yes  Device Plugged into RED Power Outlet Yes  BiPAP/CPAP /SiPAP Vitals  Bilateral Breath Sounds Diminished;Clear

## 2023-06-02 NOTE — Assessment & Plan Note (Addendum)
 UA negative for acute infection.  Culture returned with E. Coli; in conversation with Dr. Aden Agreste as above, this is most likely colonization and patient does not require further antibiotic therapy as he is asymptomatic.

## 2023-06-02 NOTE — Assessment & Plan Note (Addendum)
 This AM Hgb 9.1 > 8.2. No signs of bleeding per patient, no frank blood in urine bag. Concerning due to starting anticoagulation for A fib.  Did receive 1U PRBCs yesterday.  Curb-sided urology, Dr. Aden Agreste, who does not believe that bleeding is to blame for his anemia as he does not have any frank blood in urine, no evidence of clots in tube. - Afternoon repeat Hgb to trend - AM CBC - Transfuse threshold Hgb <8 given hx of MI

## 2023-06-02 NOTE — Care Plan (Signed)
 Made aware via secure chat by RN Odilia Bennett that pt did not receive CTX last night, it was found hanging by bed this AM.  Fortunately based on conversation with Urology (please see full documentation in today's progress note) patient does not need further abx treatment.  Presented bedside to discuss with pt and wife, answered all questions.

## 2023-06-02 NOTE — Assessment & Plan Note (Addendum)
 No further episodes as of this morning.  Denies dizziness/lightheadedness, shortness of breath, chest pain or pressure.  CT head yesterday without acute changes to explain near syncope.  No clear explanation, but suspect this may be due to new onset A-fib or anemia. - PT/OT eval and treat - AM CBC, BMP

## 2023-06-02 NOTE — Assessment & Plan Note (Addendum)
 New diagnosis of Afib, remains rate controlled on tele.  Asymptomatic on exam this AM. - Cont heparin  per pharmacy; will transition to Eliquis pending further workup of anemia as below - Echo ordered  - Needs cardiology follow up outpatient set up

## 2023-06-02 NOTE — Assessment & Plan Note (Signed)
 Improving. Cr downtrending to 1.45 this AM. - AM BMP

## 2023-06-02 NOTE — Assessment & Plan Note (Addendum)
 D-dimer 3.0 on arrival though no further workup was completed prior to admission. Possibly contributing to picture of pre-syncope. - Order DVT ultrasound bilaterally  - If DVT ultrasounds negative order VQ scan  - Heparin  per pharmacy given new Afib  - Continuous cardiac monitoring

## 2023-06-02 NOTE — Progress Notes (Signed)
 Daily Progress Note Intern Pager: [phone]  Patient name: Brad [name] Medical record number: [id number] Date of birth: 1960-06-20 Age: 63 y.o. Gender: male  Primary Care Provider: Genora Kidd, MD Consultants: None Code Status: Full  Pt Overview and Major Events to Date:  5/3-admitted 5/3-1U PRBCs transfused  Assessment and Plan: Baruc Ivens is a 63 year old male with history of T2DM, HTN, bladder/prostate cancer, OSA admitted for near syncope, found to have new onset A-fib and UTI.  Well-appearing this morning. Assessment & Plan Near syncope No further episodes as of this morning.  Denies dizziness/lightheadedness, shortness of breath, chest pain or pressure.  CT head yesterday without acute changes to explain near syncope.  No clear explanation, but suspect this may be due to new onset A-fib or anemia. - PT/OT eval and treat - AM CBC, BMP Elevated d-dimer D-dimer 3.0 on arrival though no further workup was completed prior to admission. Possibly contributing to picture of pre-syncope. - Order DVT ultrasound bilaterally  - If DVT ultrasounds negative order VQ scan  - Heparin  per pharmacy given new Afib  - Continuous cardiac monitoring  Atrial fibrillation (HCC) New diagnosis of Afib, remains rate controlled on tele.  Asymptomatic on exam this AM. - Cont heparin  per pharmacy; will transition to Eliquis pending further workup of anemia as below - Echo ordered  - Needs cardiology follow up outpatient set up  Anemia This AM Hgb 9.1 > 8.2. No signs of bleeding per patient, no frank blood in urine bag. Concerning due to starting anticoagulation for A fib.  Did receive 1U PRBCs yesterday.  Curb-sided urology, Dr. Aden Agreste, who does not believe that bleeding is to blame for his anemia as he does not have any frank blood in urine, no evidence of clots in tube. - Afternoon repeat Hgb to trend - AM CBC - Transfuse threshold Hgb <8 given hx of MI History of urostomy UA negative for  acute infection.  Culture returned with E. Coli; in conversation with Dr. Aden Agreste as above, this is most likely colonization and patient does not require further antibiotic therapy as he is asymptomatic. AKI (acute kidney injury) (HCC) Improving. Cr downtrending to 1.45 this AM. - AM BMP Chronic health problem Hx bladder/prostate cancer: S/p neoadjuvant ddMVAC and cystoprostatectomy. Urostomy in place.  HTN: Remains low to normotensive at this time, will continue to hold home BP meds at this time. Consider restarting home meds as indicated.  T2DM: Last A1c 6.6 in Feb 2025. Home regimen includes Mounjaro  12.5 weekly. Will monitor CBGs AC HS.  OSA: Cont home CPAP nightly. Knee pain: Bilateral, chronic; worsened by recent falls.  Lidocaine  patches PRN.   FEN/GI: Carb modified diet PPx: Heparin  per pharmacy Dispo:Home pending clinical improvement . Barriers include further workup of syncope as indicated, clinical stability.   Subjective:  Patient seen this morning sitting up at side of bed having just finished his breakfast.  He reports he is feeling very well, no dizziness, heart racing, shortness of breath, belly pain, or any other complaints.  He is hopeful to go home soon.  Objective: Temp:  [97.8 F (36.6 C)-98 F (36.7 C)] 97.8 F (36.6 C) (05/04 0914) Pulse Rate:  [54-96] 87 (05/04 0914) Resp:  [13-18] 13 (05/04 0840) BP: (100-122)/(69-85) 117/85 (05/04 0914) SpO2:  [100 %] 100 % (05/04 0914) FiO2 (%):  [21 %] 21 % (05/03 2155) Physical Exam: General: Well-appearing, pleasant, NAD Cardiovascular: RRR, no murmurs.  Radial pulses 2+ bilaterally. Respiratory: CTA bilaterally, normal work of  breathing on room air. Abdomen: Normoactive bowel sounds, soft, nontender.  No frank blood in urine bag. Extremities: Moves all equally.  No LE edema bilaterally.  Laboratory: Most recent CBC Lab Results  Component Value Date   WBC 8.6 06/02/2023   HGB 8.2 (L) 06/02/2023   HCT 24.4 (L)  06/02/2023   MCV 84.4 06/02/2023   PLT 262 06/02/2023   Most recent BMP    Latest Ref Rng & Units 06/02/2023    2:43 AM  BMP  Glucose 70 - 99 mg/dL 161   BUN 8 - 23 mg/dL 28   Creatinine 0.96 - 1.24 mg/dL 0.45   Sodium 409 - 811 mmol/L 139   Potassium 3.5 - 5.1 mmol/L 3.8   Chloride 98 - 111 mmol/L 110   CO2 22 - 32 mmol/L 22   Calcium  8.9 - 10.3 mg/dL 8.9     5/3 CT head: IMPRESSION: 1. No acute intracranial abnormality or significant interval change. 2. Remote Hallett matter infarct just above the atrium of the left lateral ventricle and extends into the left side of the posterior body of the corpus callosum is stable. 3. Remote lacunar infarcts in the anterior limb of the right internal capsule are stable. 4. Remote right paramedian pontine infarct is stable. 5. Periventricular Loja matter hypoattenuation is moderately advanced for age. This likely reflects the sequela of chronic microvascular ischemia.   Omar Bibber, DO 06/02/2023, 11:23 AM  PGY-1, [facility] FPTS Intern pager: [phone], text pages welcome Secure chat group [facility] Teaching Service

## 2023-06-02 NOTE — Progress Notes (Signed)
 PHARMACY - ANTICOAGULATION CONSULT NOTE  Pharmacy Consult for heparin  Indication: atrial fibrillation  No Known Allergies  Patient Measurements: Height: 6\' 9"  (205.7 cm) Weight: 122.1 kg (269 lb 2.9 oz) IBW/kg (Calculated) : 98.3 HEPARIN  DW (KG): 122.1  Vital Signs: Temp: 97.8 F (36.6 C) (05/04 0914) Temp Source: Oral (05/04 0914) BP: 117/85 (05/04 0914) Pulse Rate: 87 (05/04 0914)  Labs: Recent Labs    05/31/23 2044 05/31/23 2055 06/01/23 0438 06/01/23 0536 06/01/23 0925 06/01/23 1926 06/02/23 0243  HGB 8.3* 8.5*  --  7.4*  --  9.1* 8.2*  HCT 25.5* 25.0*  --  22.6*  --  27.3* 24.4*  PLT 299  --   --  268  --   --  262  HEPARINUNFRC  --   --   --   --  0.27* 0.64 0.55  CREATININE 2.52* 2.50* 2.05*  --   --   --  1.45*  TROPONINIHS 8  --   --   --   --   --   --     Estimated Creatinine Clearance: 81.6 mL/min (A) (by C-G formula based on SCr of 1.45 mg/dL (H)).   Medical History: Past Medical History:  Diagnosis Date   Abnormal ultrasound of carotid artery 04/2014   no significant obstruction   Ataxia 04/2014   Cancer (HCC)    Diabetes (HCC)    EKG abnormalities 04/2014   poor R wave progression, NSR   Former smoker    25 years x 1.5 ppd, stopped 04/2014   Gait disturbance 04/2014   s/p stroke   H/O echocardiogram 04/2014   moderate LVH, 60-65% EF, no valve disease   History of heart attack    Hypertension    Hypertensive heart disease 04/2014   Intraparenchymal hemorrhage of brain [facility]) 05/05/2014   hospitalization [facility]   Myocardial infarction [facility])    Obesity    Prediabetes    Short-term memory loss 04/2014   Sleep apnea    Stroke (HCC) 04/2014    Medications:  No current facility-administered medications on file prior to encounter.   Current Outpatient Medications on File Prior to Encounter  Medication Sig Dispense Refill   acetaminophen  (TYLENOL ) 500 MG tablet Take 500 mg by mouth every 6 (six) hours as needed for  moderate pain (pain score 4-6).     amLODipine  (NORVASC ) 10 MG tablet Take 1 tablet (10 mg total) by mouth daily. 90 tablet 3   chlorthalidone  (HYGROTON ) 25 MG tablet Take 1 tablet (25 mg total) by mouth daily. (Patient taking differently: Take 25 mg by mouth daily as needed (fluid retention).) 30 tablet 2   Cholecalciferol (VITAMIN D3 MAXIMUM STRENGTH) 125 MCG (5000 UT) capsule Take 5,000 Units by mouth daily.     gabapentin  (NEURONTIN ) 100 MG capsule Take 1 capsule (100 mg total) by mouth at bedtime for 3 days, THEN 2 capsules (200 mg total) at bedtime. (Patient taking differently: Take 100 mg at bedtime) 63 capsule 0   ibuprofen (ADVIL) 800 MG tablet Take 400 mg by mouth every 6 (six) hours as needed.     labetalol  (NORMODYNE ) 200 MG tablet TAKE 1 TABLET(200 MG) BY MOUTH TWICE DAILY 180 tablet 3   losartan  (COZAAR ) 100 MG tablet Take 1 tablet (100 mg total) by mouth daily. 90 tablet 3   magnesium  chloride (SLOW-MAG) 64 MG TBEC SR tablet Take 1 tablet by mouth daily.     MOUNJARO  12.5 MG/0.5ML Pen Inject 12.5 mg into the skin  once a week. On Monday     Multiple Vitamins-Minerals (ONE-A-DAY MENS 50+ ADVANTAGE) TABS Take 1 tablet by mouth daily.     blood glucose meter kit and supplies KIT Dispense based on patient and insurance preference. Check fasting blood sugar once daily ICD10 R73.03 1 each 0   glucose blood test strip Use as instructed to monitor FSBS 1x daily. Dx: R73.09 100 each 12   Lancets (ONETOUCH ULTRASOFT) lancets Use as instructed 100 each 12     Assessment: 63 y.o. male admitted with syncope, new onset Afib, for heparin .  Morning heparin  level therapeutic, but down trending at 0.55. Renal function slightly improved, Cr 2.05 > 1.45 today. No issues noted. Given plausible renal improvement, and previous 0.64 HL being post bolus and rate increase, will increase rate slightly.   Goal of Therapy:  Heparin  level 0.3-0.7 units/ml Monitor platelets by anticoagulation protocol:  Yes  Plan:  Increase heparin  2000 units/hr > 2050 units/hr Daily heparin  level and CBC  Estela Held, PharmD PGY-2 Infectious Diseases Pharmacy Resident [facility] 06/02/2023 9:16 AM

## 2023-06-03 ENCOUNTER — Inpatient Hospital Stay (HOSPITAL_COMMUNITY)

## 2023-06-03 ENCOUNTER — Ambulatory Visit (HOSPITAL_COMMUNITY): Admitting: Nurse Practitioner

## 2023-06-03 DIAGNOSIS — Z9889 Other specified postprocedural states: Secondary | ICD-10-CM | POA: Diagnosis not present

## 2023-06-03 DIAGNOSIS — R55 Syncope and collapse: Secondary | ICD-10-CM | POA: Diagnosis not present

## 2023-06-03 DIAGNOSIS — I480 Paroxysmal atrial fibrillation: Secondary | ICD-10-CM | POA: Diagnosis not present

## 2023-06-03 DIAGNOSIS — N179 Acute kidney failure, unspecified: Secondary | ICD-10-CM

## 2023-06-03 LAB — BASIC METABOLIC PANEL WITH GFR
Anion gap: 7 (ref 5–15)
BUN: 23 mg/dL (ref 8–23)
CO2: 22 mmol/L (ref 22–32)
Calcium: 9.1 mg/dL (ref 8.9–10.3)
Chloride: 112 mmol/L — ABNORMAL HIGH (ref 98–111)
Creatinine, Ser: 1.34 mg/dL — ABNORMAL HIGH (ref 0.61–1.24)
GFR, Estimated: >60 mL/min (ref >60)
Glucose, Bld: 111 mg/dL — ABNORMAL HIGH (ref 70–99)
Potassium: 3.9 mmol/L (ref 3.5–5.1)
Sodium: 141 mmol/L (ref 135–145)

## 2023-06-03 LAB — URINE CULTURE: Culture: >=100000 — AB

## 2023-06-03 LAB — CBC
HCT: 24.3 % — ABNORMAL LOW (ref 39.0–52.0)
Hemoglobin: 7.9 g/dL — ABNORMAL LOW (ref 13.0–17.0)
MCH: 27.7 pg (ref 26.0–34.0)
MCHC: 32.5 g/dL (ref 30.0–36.0)
MCV: 85.3 fL (ref 80.0–100.0)
Platelets: 259 10*3/uL (ref 150–400)
RBC: 2.85 MIL/uL — ABNORMAL LOW (ref 4.22–5.81)
RDW: 14.1 % (ref 11.5–15.5)
WBC: 8.6 10*3/uL (ref 4.0–10.5)
nRBC: 0 % (ref 0.0–0.2)

## 2023-06-03 LAB — HEMOGLOBIN AND HEMATOCRIT, BLOOD
HCT: 23.8 % — ABNORMAL LOW (ref 39.0–52.0)
HCT: 28.2 % — ABNORMAL LOW (ref 39.0–52.0)
Hemoglobin: 7.8 g/dL — ABNORMAL LOW (ref 13.0–17.0)
Hemoglobin: 9.3 g/dL — ABNORMAL LOW (ref 13.0–17.0)

## 2023-06-03 LAB — HEPARIN LEVEL (UNFRACTIONATED)
Heparin Unfractionated: 0.41 [IU]/mL (ref 0.30–0.70)
Heparin Unfractionated: 0.71 [IU]/mL — ABNORMAL HIGH (ref 0.30–0.70)

## 2023-06-03 LAB — GLUCOSE, CAPILLARY
Glucose-Capillary: 157 mg/dL — ABNORMAL HIGH (ref 70–99)
Glucose-Capillary: 134 mg/dL — ABNORMAL HIGH (ref 70–99)
Glucose-Capillary: 129 mg/dL — ABNORMAL HIGH (ref 70–99)
Glucose-Capillary: 115 mg/dL — ABNORMAL HIGH (ref 70–99)

## 2023-06-03 LAB — PREPARE RBC (CROSSMATCH)

## 2023-06-03 MED ORDER — SODIUM CHLORIDE (PF) 0.9 % IJ SOLN
INTRAMUSCULAR | Status: AC
  Filled 2023-06-03: qty 10

## 2023-06-03 MED ORDER — SODIUM CHLORIDE 0.9% IV SOLUTION: Freq: Once | INTRAVENOUS | Status: AC

## 2023-06-03 MED ORDER — IOHEXOL 350 MG/ML SOLN
75.0000 mL | Freq: Once | INTRAVENOUS | Status: AC | PRN
  Administered 2023-06-03: 75 mL via INTRAVENOUS

## 2023-06-03 MED ORDER — PANTOPRAZOLE SODIUM 40 MG IV SOLR
40.0000 mg | Freq: Two times a day (BID) | INTRAVENOUS | Status: DC
  Administered 2023-06-03 – 2023-06-04 (×3): 40 mg via INTRAVENOUS
  Filled 2023-06-03 (×3): qty 10

## 2023-06-03 NOTE — Assessment & Plan Note (Addendum)
 D-dimer 3.0 on arrival; unable to obtain bilateral DVT US  over the weekend - at this point will order CTA for further evaluation given potential for clot and new anemia with concern for acute bleed. - CTA PE - discussed with radiology, can get CTA PE in conjunction with CT CAP ordered OP by Dr. Alita Irwin, oncology. These have both been ordered. - Heparin  per pharmacy given new Afib

## 2023-06-03 NOTE — Assessment & Plan Note (Signed)
 Hx bladder/prostate cancer: S/p neoadjuvant ddMVAC and cystoprostatectomy. Urostomy in place. Colonized with E coli. HTN: Remains normotensive at this time, will continue to hold home BP meds for now.  Restart home meds as indicated. T2DM: Last A1c 6.6 in Feb 2025. Home regimen includes Mounjaro  12.5 weekly. Will monitor CBGs AC HS.  OSA: Cont home CPAP nightly. Knee pain: Bilateral, chronic; worsened by recent falls.  Lidocaine  patches PRN.

## 2023-06-03 NOTE — Progress Notes (Signed)
 PT Cancellation Note  Patient Details Name: Adedamola Gasparyan MRN: [id number] DOB: 1960-10-11   Cancelled Treatment:    Reason Eval/Treat Not Completed: Patient at procedure or test/unavailable. Transport showed up to take pt to CT as PT session was beginning. Will check back as schedule allows to continue with PT POC.    Venus Ginsberg 06/03/2023, 3:49 PM  Simone Dubois, PT, DPT Acute Rehabilitation Services Secure Chat Preferred Office: [phone]

## 2023-06-03 NOTE — Plan of Care (Signed)
  Problem: Metabolic: Goal: Ability to maintain appropriate glucose levels will improve Outcome: Progressing   Problem: Nutritional: Goal: Maintenance of adequate nutrition will improve Outcome: Progressing   Problem: Skin Integrity: Goal: Risk for impaired skin integrity will decrease Outcome: Progressing   Problem: Clinical Measurements: Goal: Ability to maintain clinical measurements within normal limits will improve Outcome: Progressing Goal: Will remain free from infection Outcome: Progressing   Problem: Pain Managment: Goal: General experience of comfort will improve and/or be controlled Outcome: Progressing   Problem: Safety: Goal: Ability to remain free from injury will improve Outcome: Progressing   Problem: Skin Integrity: Goal: Risk for impaired skin integrity will decrease Outcome: Progressing

## 2023-06-03 NOTE — Assessment & Plan Note (Addendum)
 No further episodes as of this morning.  Denies dizziness.  Still no clear explanation for this but strongly suspect related to new onset A-fib as below.  Patient greatly desires to get up and ambulate rather than being stuck in the bed. - PT/OT eval and treat - AM CBC, BMP

## 2023-06-03 NOTE — Progress Notes (Signed)
 PHARMACY - ANTICOAGULATION CONSULT NOTE  Pharmacy Consult for heparin  Indication: atrial fibrillation  No Known Allergies  Patient Measurements: Height: 6\' 9"  (205.7 cm) Weight: 122.1 kg (269 lb 2.9 oz) IBW/kg (Calculated) : 98.3 HEPARIN  DW (KG): 122.1  Vital Signs: Temp: 98 F (36.7 C) (05/05 1037) Temp Source: Oral (05/05 1037) BP: 127/77 (05/05 0910) Pulse Rate: 2 (05/05 1037)  Labs: Recent Labs    05/31/23 2044 05/31/23 2055 06/01/23 0438 06/01/23 0536 06/01/23 1926 06/02/23 0243 06/02/23 1545 06/03/23 0257 06/03/23 0450  HGB 8.3*   < >  --    < > 9.1* 8.2* 8.8* 7.9* 7.8*  HCT 25.5*   < >  --    < > 27.3* 24.4* 26.7* 24.3* 23.8*  PLT 299  --   --    < >  --  262 308 259  --   HEPARINUNFRC  --   --   --    < > 0.64 0.55  --  0.41  --   CREATININE 2.52*   < > 2.05*  --   --  1.45*  --  1.34*  --   TROPONINIHS 8  --   --   --   --   --   --   --   --    < > = values in this interval not displayed.    Estimated Creatinine Clearance: 88.3 mL/min (A) (by C-G formula based on SCr of 1.34 mg/dL (H)).   Assessment: 63 y.o. male admitted with syncope and new onset Afib. Pharmacy consulted to manage IV heparin .  Heparin  level is therapeutic at 0.41 on 2050 units/hr. No overt bleeding noted, has needed 2 units RBC for anemia, Hgb down to 7.8, platelets are normal.    Goal of Therapy:  Heparin  level 0.3-0.5 units/ml due to anemia Monitor platelets by anticoagulation protocol: Yes  Plan:  Continue heparin  drip at 2050 units/hr Daily heparin  level and CBC Monitor for s/sx of bleeding  Thank you for involving pharmacy in this patient's care.  Caroline Cinnamon, PharmD, BCPS Clinical Pharmacist Clinical phone for 06/03/2023 is [phone] 06/03/2023 11:12 AM

## 2023-06-03 NOTE — Progress Notes (Addendum)
 Daily Progress Note Intern Pager: [phone]  Patient name: Brad [name] Medical record number: [id number] Date of birth: 27-May-1960 Age: 63 y.o. Gender: male  Primary Care Provider: Genora Kidd, MD Consultants: GI Code Status: Full  Pt Overview and Major Events to Date:  5/3 - admitted; 1U PRBCs transfused 5/5 - 1U PRBCs transfused  Assessment and Plan: Brad [name] is a 63 y.o. with history of T2DM, HTN, bladder/prostate cancer with urostomy, OSA presented for near syncope, found to have new onset A-fib. Puzzling drops in Hgb requiring 2 transfusions, needing further workup.  Assessment & Plan Near syncope No further episodes as of this morning.  Denies dizziness.  Still no clear explanation for this but strongly suspect related to new onset A-fib as below.  Patient greatly desires to get up and ambulate rather than being stuck in the bed. - PT/OT eval and treat - AM CBC, BMP Elevated d-dimer D-dimer 3.0 on arrival; unable to obtain bilateral DVT US  over the weekend - at this point will order CTA for further evaluation given potential for clot and new anemia with concern for acute bleed. - CTA PE - discussed with radiology, can get CTA PE in conjunction with CT CAP ordered OP by Dr. Alita Irwin, oncology. These have both been ordered. - Heparin  per pharmacy given new Afib  Atrial fibrillation (HCC) New diagnosis of Afib as admission, remains rate controlled on tele.  Asymptomatic on exam this AM.  Echo with LVEF 60-65%, moderate dilatation of the ascending aorta to 42 mm, right atrial pressure of 3 mmHg. - Cont heparin  per pharmacy; will transition to Eliquis pending further workup of anemia as below - Needs cardiology follow up outpatient Anemia Did have borderline Hgb yesterday that improved on recheck. Unfortunately this AM Hgb 7.9, and confirmed on recheck. Receiving 1U PRBC transfusion this morning. No overt signs of bleeding, but question GI source due to significant drops.   Does need transition to DOAC for A-fib as above, however concerned to make any major adjustments at this time as dropping hemoglobin. Messaged Dr. Alita Irwin, pts primary Oncologist, who advised keeping transfusion threshold <8; greatly appreciate recommendations. - post transfusion H&H - GI consult for further workup  - initiate IV protonix 40 mg in case of GI bleed - AM CBC - Transfuse threshold Hgb <8 given hx of MI, confirmed with Dr. Alita Irwin AKI (acute kidney injury) [facility]) Improving. Cr downtrending to 1.34 this AM. - AM BMP Chronic health problem Hx bladder/prostate cancer: S/p neoadjuvant ddMVAC and cystoprostatectomy. Urostomy in place. Colonized with E coli. HTN: Remains normotensive at this time, will continue to hold home BP meds for now.  Restart home meds as indicated. T2DM: Last A1c 6.6 in Feb 2025. Home regimen includes Mounjaro  12.5 weekly. Will monitor CBGs AC HS.  OSA: Cont home CPAP nightly. Knee pain: Bilateral, chronic; worsened by recent falls.  Lidocaine  patches PRN.  FEN/GI: Carb modified diet PPx: Heparin  per pharmacy Dispo:Home pending clinical improvement . Barriers include further workup of anemia, hemoglobin stability.   Subjective:  Patient seen this morning resting in bed.  He denies dizziness, discomfort.  Specifically denies pain or swelling to bilateral calves.  He has not noticed any blood in his urine or stool, no bleeding noted from any other sources.  He feels well and hopes he can go home soon.  Objective: Temp:  [97.8 F (36.6 C)-98.7 F (37.1 C)] 98 F (36.7 C) (05/05 1037) Pulse Rate:  [80-91] 80 (05/05 0910) Resp:  [16-18]  18 (05/05 1037) BP: (113-130)/(77-96) 127/77 (05/05 0910) SpO2:  [98 %-100 %] 98 % (05/05 1037) FiO2 (%):  [21 %] 21 % (05/04 2343) Physical Exam: General: Lying in bed, comfortable, NAD Cardiovascular: RRR, no murmurs.  Radial pulses 2+ bilaterally. Respiratory: CTA anteriorly.  Normal work of breathing on room air. Abdomen:  Normoactive bowel sounds.  Soft, nontender, nondistended. Extremities: Moves all equally.  Bilateral LEs without swelling/edema, no tenderness to palpation, erythema, warmth.  Laboratory: Most recent CBC Lab Results  Component Value Date   WBC 8.6 06/03/2023   HGB 7.8 (L) 06/03/2023   HCT 23.8 (L) 06/03/2023   MCV 85.3 06/03/2023   PLT 259 06/03/2023   Most recent BMP    Latest Ref Rng & Units 06/03/2023    2:57 AM  BMP  Glucose 70 - 99 mg/dL 253   BUN 8 - 23 mg/dL 23   Creatinine 6.64 - 1.24 mg/dL 4.03   Sodium 474 - 259 mmol/L 141   Potassium 3.5 - 5.1 mmol/L 3.9   Chloride 98 - 111 mmol/L 112   CO2 22 - 32 mmol/L 22   Calcium  8.9 - 10.3 mg/dL 9.1     5/3 Echo: LVEF 60-65%.  No LV regional wall motion abnormalities.  Mild calcification of aortic valve.  Moderate dilatation of the ascending aorta to 42 mm.  Right atrial pressure 3 mmHg.   Omar Bibber, DO 06/03/2023, 12:43 PM  PGY-1, [facility] Family Medicine FPTS Intern pager: [phone], text pages welcome Secure chat group [facility] Teaching Service

## 2023-06-03 NOTE — Plan of Care (Signed)

## 2023-06-03 NOTE — Consult Note (Signed)
 WOC Nurse ostomy consult note Stoma type/location: RLQ ileal conduit Stomal assessment/size: 1 " pink and moist Peristomal assessment: intact Treatment options for stomal/peristomal skin: barrier ring, convex pouch, belt  powder and skin prep if skin is irritated.  Output clear yellow urine Ostomy pouching: 1pc.covnex 1 piece convex pouch LAWSON # [id number]  Adaptor (LAWSON # [id number]) Barrier ring LAWSON # [id number] Stoma powder LAWSON # 6 Skin prep LAWSON # [id number] Belt  LAWSON # [id number]  Education provided: None needed at this time.  Supply information sent to bedside RN to obtain supplies.  Enrolled patient in [employer] DC program: Yes previously Will not follow at this time.  Please re-consult if needed.  Brad Cain MSN, RN, FNP-BC CWON Wound, Ostomy, Continence Nurse Outpatient [facility] [phone] Pager [phone]

## 2023-06-03 NOTE — Assessment & Plan Note (Signed)
 Improving. Cr downtrending to 1.34 this AM. - AM BMP

## 2023-06-03 NOTE — Progress Notes (Signed)
 Blood bank called to confirm patient blood is ready. Informed Charge Nurse who is aware of my blood exemption. Will also share information with day RN.

## 2023-06-03 NOTE — Assessment & Plan Note (Addendum)
 Did have borderline Hgb yesterday that improved on recheck. Unfortunately this AM Hgb 7.9, and confirmed on recheck. Receiving 1U PRBC transfusion this morning. No overt signs of bleeding, but question GI source due to significant drops.  Does need transition to DOAC for A-fib as above, however concerned to make any major adjustments at this time as dropping hemoglobin. Messaged Dr. Alita Irwin, pts primary Oncologist, who advised keeping transfusion threshold <8; greatly appreciate recommendations. - post transfusion H&H - GI consult for further workup  - initiate IV protonix 40 mg in case of GI bleed - AM CBC - Transfuse threshold Hgb <8 given hx of MI, confirmed with Dr. Alita Irwin

## 2023-06-03 NOTE — Progress Notes (Signed)
 Physical Therapy Treatment Patient Details Name: Brad [name] MRN: [id number] DOB: 06/20/1960 Today's Date: 06/03/2023   History of Present Illness The pt is a 63 yo male presenting 5/2 after 2 near syncopal episodes. Admitted for medical management, work up revealed pt hypotensive upon arrival, new dx of afib, UA suggestive of UTI, and AKI. PMH includes: bladder cancer, DM II, MI in 2016, CVA in 2016, HTN, OSA.    PT Comments  Patient progressing to ambulation without device and denied dizziness symptoms.  Noted knee pain much improved and eager for ambulation in hallway.  Remains appropriate for home though once IV discontinued could benefit from PT for stair negotiation.      If plan is discharge home, recommend the following: A little help with walking and/or transfers;Help with stairs or ramp for entrance   Can travel by private vehicle        Equipment Recommendations  None recommended by PT    Recommendations for Other Services       Precautions / Restrictions Precautions Precautions: Fall Precaution/Restrictions Comments: watch HR and BP     Mobility  Bed Mobility Overal bed mobility: Modified Independent                  Transfers Overall transfer level: Modified independent Equipment used: None                    Ambulation/Gait Ambulation/Gait assistance: Supervision, Contact guard assist Gait Distance (Feet): 400 Feet Assistive device: None Gait Pattern/deviations: Step-through pattern, Drifts right/left       General Gait Details: walking without device with CGA to S occasional veering in hallway and mild unsteadiness though no LOB and HR 95 with ambulation.   Stairs             Wheelchair Mobility     Tilt Bed    Modified Rankin (Stroke Patients Only)       Balance Overall balance assessment: Needs assistance   Sitting balance-Leahy Scale: Good       Standing balance-Leahy Scale: Good                               Communication Communication Communication: No apparent difficulties  Cognition Arousal: Alert Behavior During Therapy: WFL for tasks assessed/performed   PT - Cognitive impairments: No apparent impairments                         Following commands: Intact      Cueing Cueing Techniques: Verbal cues  Exercises      General Comments General comments (skin integrity, edema, etc.): wife present and supportive      Pertinent Vitals/Pain Pain Assessment Pain Score: 2  Pain Location: knees Pain Descriptors / Indicators: Sore Pain Intervention(s): Monitored during session    Home Living                          Prior Function            PT Goals (current goals can now be found in the care plan section) Progress towards PT goals: Progressing toward goals    Frequency    Min 2X/week      PT Plan      Co-evaluation              AM-PAC PT "6 Clicks" Mobility   Outcome Measure  Help needed turning from your back to your side while in a flat bed without using bedrails?: None Help needed moving from lying on your back to sitting on the side of a flat bed without using bedrails?: None Help needed moving to and from a bed to a chair (including a wheelchair)?: None Help needed standing up from a chair using your arms (e.g., wheelchair or bedside chair)?: None Help needed to walk in hospital room?: A Little Help needed climbing 3-5 steps with a railing? : A Little 6 Click Score: 22    End of Session   Activity Tolerance: Patient tolerated treatment well Patient left: in chair;with call bell/phone within reach;with family/visitor present   PT Visit Diagnosis: Unsteadiness on feet (R26.81);Muscle weakness (generalized) (M62.81)     Time: 0960-4540 PT Time Calculation (min) (ACUTE ONLY): 16 min  Charges:    $Gait Training: 8-22 mins PT General Charges $$ ACUTE PT VISIT: 1 Visit                     Abigail Hoff, PT Acute  Rehabilitation Services Office:[phone] 06/03/2023    Brad [name] 06/03/2023, 4:53 PM

## 2023-06-03 NOTE — Assessment & Plan Note (Addendum)
 New diagnosis of Afib as admission, remains rate controlled on tele.  Asymptomatic on exam this AM.  Echo with LVEF 60-65%, moderate dilatation of the ascending aorta to 42 mm, right atrial pressure of 3 mmHg. - Cont heparin  per pharmacy; will transition to Eliquis pending further workup of anemia as below - Needs cardiology follow up outpatient

## 2023-06-04 ENCOUNTER — Telehealth (HOSPITAL_COMMUNITY): Payer: Self-pay | Admitting: Pharmacy Technician

## 2023-06-04 ENCOUNTER — Encounter (HOSPITAL_COMMUNITY): Payer: Self-pay

## 2023-06-04 ENCOUNTER — Encounter (HOSPITAL_COMMUNITY)

## 2023-06-04 ENCOUNTER — Other Ambulatory Visit (HOSPITAL_COMMUNITY): Payer: Self-pay

## 2023-06-04 ENCOUNTER — Ambulatory Visit (HOSPITAL_COMMUNITY)

## 2023-06-04 ENCOUNTER — Other Ambulatory Visit: Payer: Self-pay | Admitting: Family Medicine

## 2023-06-04 DIAGNOSIS — D649 Anemia, unspecified: Secondary | ICD-10-CM

## 2023-06-04 DIAGNOSIS — R7989 Other specified abnormal findings of blood chemistry: Secondary | ICD-10-CM | POA: Diagnosis not present

## 2023-06-04 DIAGNOSIS — R55 Syncope and collapse: Secondary | ICD-10-CM | POA: Diagnosis not present

## 2023-06-04 DIAGNOSIS — N179 Acute kidney failure, unspecified: Secondary | ICD-10-CM | POA: Diagnosis not present

## 2023-06-04 DIAGNOSIS — I480 Paroxysmal atrial fibrillation: Secondary | ICD-10-CM | POA: Diagnosis not present

## 2023-06-04 LAB — TYPE AND SCREEN
ABO/RH(D): O POS
Antibody Screen: NEGATIVE
Unit division: 0
Unit division: 0

## 2023-06-04 LAB — CBC
HCT: 25.4 % — ABNORMAL LOW (ref 39.0–52.0)
Hemoglobin: 8.5 g/dL — ABNORMAL LOW (ref 13.0–17.0)
MCH: 28.4 pg (ref 26.0–34.0)
MCHC: 33.5 g/dL (ref 30.0–36.0)
MCV: 84.9 fL (ref 80.0–100.0)
Platelets: 260 10*3/uL (ref 150–400)
RBC: 2.99 MIL/uL — ABNORMAL LOW (ref 4.22–5.81)
RDW: 14 % (ref 11.5–15.5)
WBC: 10.4 10*3/uL (ref 4.0–10.5)
nRBC: 0 % (ref 0.0–0.2)

## 2023-06-04 LAB — HEPARIN LEVEL (UNFRACTIONATED): Heparin Unfractionated: 0.67 [IU]/mL (ref 0.30–0.70)

## 2023-06-04 LAB — BPAM RBC
Blood Product Expiration Date: 202505312359
Blood Product Expiration Date: 202505312359
ISSUE DATE / TIME: 202505031036
ISSUE DATE / TIME: 202505050756
Unit Type and Rh: 5100
Unit Type and Rh: 5100

## 2023-06-04 LAB — OCCULT BLOOD X 1 CARD TO LAB, STOOL: Fecal Occult Bld: NEGATIVE

## 2023-06-04 LAB — GLUCOSE, CAPILLARY: Glucose-Capillary: 98 mg/dL (ref 70–99)

## 2023-06-04 MED ORDER — PANTOPRAZOLE SODIUM 40 MG PO TBEC
40.0000 mg | DELAYED_RELEASE_TABLET | Freq: Two times a day (BID) | ORAL | 0 refills | Status: DC
  Filled 2023-06-04: qty 60, 30d supply, fill #0

## 2023-06-04 MED ORDER — APIXABAN 5 MG PO TABS
5.0000 mg | ORAL_TABLET | Freq: Two times a day (BID) | ORAL | Status: DC
  Administered 2023-06-04: 5 mg via ORAL
  Filled 2023-06-04: qty 1

## 2023-06-04 MED ORDER — DICLOFENAC SODIUM 1 % EX GEL: 4.0000 g | Freq: Four times a day (QID) | CUTANEOUS | Status: AC | PRN

## 2023-06-04 MED ORDER — APIXABAN 5 MG PO TABS
5.0000 mg | ORAL_TABLET | Freq: Two times a day (BID) | ORAL | 0 refills | Status: DC
Start: 2023-06-04 — End: 2023-07-05
  Filled 2023-06-04: qty 60, 30d supply, fill #0

## 2023-06-04 MED ORDER — HEPARIN SOD (PORK) LOCK FLUSH 100 UNIT/ML IV SOLN
500.0000 [IU] | INTRAVENOUS | Status: AC | PRN
  Administered 2023-06-04: 500 [IU]

## 2023-06-04 NOTE — Assessment & Plan Note (Deleted)
 D-dimer 3.0 on arrival; fortunately CTA PE without evidence of PE.  Patient continues to be VSS, normal lung exam.

## 2023-06-04 NOTE — TOC Transition Note (Signed)
 Transition of Care [facility]) - Discharge Note   Patient Details  Name: Brad [name] MRN: [id number] Date of Birth: 01-12-1961  Transition of Care [facility]) CM/SW Contact:  Tom-Johnson, Donae Kueker Daphne, RN Phone Number: 06/04/2023, 12:31 PM   Clinical Narrative:     Patient is scheduled for discharge today.  Readmission Risk Assessment done. Home health resumption of care info, hospital f/u and discharge instructions on AVS. Prescriptions sent to [facility] pharmacy and patient will receive meds prior discharge. Daughter to transport at discharge.  No further TOC needs noted.       Final next level of care: Home w Home Health Services Barriers to Discharge: Barriers Resolved   Patient Goals and CMS Choice Patient states their goals for this hospitalization and ongoing recovery are:: To return home CMS Medicare.gov Compare Post Acute Care list provided to:: Patient Choice offered to / list presented to : Patient, Spouse      Discharge Placement                Patient to be transferred to facility by: Daughter      Discharge Plan and Services Additional resources added to the After Visit Summary for                  DME Arranged: N/A DME Agency: NA       HH Arranged: PT, RN (Resumption od care) HH Agency: [facility] Date [facility] Agency Contacted: 06/04/23 Time HH Agency Contacted: 1215 Representative spoke with at [facility] Agency: Randel Buss  Social Drivers of Health (SDOH) Interventions SDOH Screenings   Food Insecurity: No Food Insecurity (06/01/2023)  Housing: Low Risk  (06/01/2023)  Transportation Needs: No Transportation Needs (06/01/2023)  Utilities: Not At Risk (06/01/2023)  Alcohol Screen: Low Risk  (07/30/2022)  Depression (PHQ2-9): Low Risk  (09/18/2022)  Financial Resource Strain: Low Risk  (07/30/2022)  Physical Activity: Insufficiently Active (07/30/2022)  Social Connections: Moderately Integrated (07/30/2022)  Stress: No Stress Concern Present (07/30/2022)  Tobacco Use:  Medium Risk (06/01/2023)     Readmission Risk Interventions    06/04/2023   12:29 PM  Readmission Risk Prevention Plan  Post Dischage Appt Complete  Medication Screening Complete  Transportation Screening Complete

## 2023-06-04 NOTE — Discharge Instructions (Addendum)
 Dear Brad [name],   Thank you for letting us  participate in your care! We are so glad you are feeling better! You were admitted because of "presyncope" with a feeling that you are going to pass out.   - While you are hospitalized you were found to have new atrial fibrillation which is an irregular beat of your heart.  You are on blood thinning medications for this and will need to follow-up with your cardiologist for further management outside the hospital.   - You also were found to have anemia (low blood counts) while you are in the hospital.  For this you received 2 blood transfusions and your hemoglobin did rise appropriately. We have placed an order for you to get your blood drawn tomorrow to follow-up your hemoglobin.  You may go to any [facility] location to have this done.  The closest location to the hospital, if this is convenient for you, is located at: [address] [place]. [address] [phone]  We started some new medications for you while you are in the hospital.  Please pay special attention to these, and do not stop taking them unless instructed by your doctor: Eliquis - take 5 mg twice daily (this is the blood thinner for your atrial fibrillation) Protonix - take 40 mg twice daily (this is a medicine to help protect your stomach from bleeding)   POST-HOSPITAL & CARE INSTRUCTIONS We recommend following up with your PCP within 1 week from being discharged from the hospital. Please let PCP/Specialists know of any changes in medications that were made which you will be able to see in the medications section of this packet. Please also follow up with your Cardiologist.  DOCTOR'S APPOINTMENTS & FOLLOW UP Future Appointments  Date Time Provider Department Center  06/04/2023  5:00 PM [facility] VASC US  2 [name]  07/29/2023 10:00 AM [facility] LAB [facility] None  08/05/2023  9:10 AM [facility] ANNUAL WELLNESS VISIT [facility]  08/15/2023 11:00 AM Lowanda Ruddy, MD [facility] None   09/24/2023 10:30 AM Terrilyn Fick, NP [facility] None     Thank you for choosing [name]! Take care and be well!  Family Medicine Teaching Service Inpatient Team [name]  Brad [name]  [address] [place], [place] [address] [phone]     Information on my medicine - ELIQUIS (apixaban)  This medication education was reviewed with me or my healthcare representative as part of my discharge preparation.    Why was Eliquis prescribed for you? Eliquis was prescribed for you to reduce the risk of a blood clot forming that can cause a stroke if you have a medical condition called atrial fibrillation (a type of irregular heartbeat).  What do You need to know about Eliquis ? Take your Eliquis TWICE DAILY - one tablet in the morning and one tablet in the evening with or without food. If you have difficulty swallowing the tablet whole please discuss with your pharmacist how to take the medication safely.  Take Eliquis exactly as prescribed by your doctor and DO NOT stop taking Eliquis without talking to the doctor who prescribed the medication.  Stopping may increase your risk of developing a stroke.  Refill your prescription before you run out.  After discharge, you should have regular check-up appointments with your healthcare provider that is prescribing your Eliquis.  In the future your dose may need to be changed if your kidney function or weight changes by a significant amount or as you  get older.  What do you do if you miss a dose? If you miss a dose, take it as soon as you remember on the same day and resume taking twice daily.  Do not take more than one dose of ELIQUIS at the same time to make up a missed dose.  Important Safety Information A possible side effect of Eliquis is bleeding. You should call your healthcare provider right away if you experience any of the following: Bleeding from an injury or your nose that does not stop. Unusual  colored urine (red or dark brown) or unusual colored stools (red or black). Unusual bruising for unknown reasons. A serious fall or if you hit your head (even if there is no bleeding).  Some medicines may interact with Eliquis and might increase your risk of bleeding or clotting while on Eliquis. To help avoid this, consult your healthcare provider or pharmacist prior to using any new prescription or non-prescription medications, including herbals, vitamins, non-steroidal anti-inflammatory drugs (NSAIDs) and supplements.  This website has more information on Eliquis (apixaban): http://www.eliquis.com/eliquis/home

## 2023-06-04 NOTE — Assessment & Plan Note (Deleted)
 New diagnosis of Afib as admission, remains rate controlled on tele.  Continues to be asymptomatic. - Cont heparin  per pharmacy; will transition to Eliquis pending further workup of anemia as below*** - Needs cardiology follow up outpatient

## 2023-06-04 NOTE — Assessment & Plan Note (Deleted)
 Received second 1U PRBC transfusion yesterday with improvement in Hgb to 9.3. Hgb this AM 8.5.  Concern for GI bleed source.  No overt signs of bleeding on exam; hemoccult negative - GI has reviewed patient and does not see indication for scope at this time.  They will not plan to see the patient. *** Does need transition to DOAC for A-fib as above, however concerned to make any major adjustments at this time as dropping hemoglobin.*** - Continue IV protonix 40 mg in case of GI bleed - AM CBC - Transfuse threshold Hgb <8 given hx of MI, confirmed with Dr. Alita Irwin

## 2023-06-04 NOTE — Telephone Encounter (Signed)
 Patient Product/process development scientist completed.    The patient is insured through [employer]. Patient has Medicare and is not eligible for a copay card, but may be able to apply for patient assistance or Medicare RX Payment Plan (Patient Must reach out to their plan, if eligible for payment plan), if available.    Ran test claim for Eliquis 5 mg and the current 30 day co-pay is $30.00.   This test claim was processed through [facility]- copay amounts may vary at other pharmacies due to pharmacy/plan contracts, or as the patient moves through the different stages of their insurance plan.     Roland Earl, CPHT Pharmacy Technician III Certified Patient Advocate [facility] Pharmacy Patient Advocate Team Direct Number: [phone]  Fax: [phone]

## 2023-06-04 NOTE — Assessment & Plan Note (Deleted)
 Hx bladder/prostate cancer: S/p neoadjuvant ddMVAC and cystoprostatectomy. Urostomy in place. Colonized with E coli. HTN: Remains normotensive at this time, will continue to hold home BP meds for now.  Restart home meds as indicated. T2DM: Last A1c 6.6 in Feb 2025. Home regimen includes Mounjaro  12.5 weekly. Will monitor CBGs AC HS.  OSA: Cont home CPAP nightly. Knee pain: Bilateral, chronic; worsened by recent falls.  Lidocaine  patches PRN.

## 2023-06-04 NOTE — Discharge Summary (Signed)
 Family Medicine Teaching [facility] Discharge Summary  Patient name: Brad [name] Medical record number: [id number] Date of birth: 1961/01/06 Age: 63 y.o. Gender: male Date of Admission: 05/31/2023  Date of Discharge: 06/04/2023 Admitting Physician: Charise Companion, MD  Primary Care Provider: Genora Kidd, MD Consultants: None  Indication for Hospitalization: Presyncope, found to have new onset A-fib  Brief Hospital Course:  Brad [name] is a 63 y.o. M with hx of T2DM, HTN, bladder/prostate cancer with urostomy, OSA admitted for near syncope.  His hospital course is outlined below:  Presyncope Several days of feeling lightheaded and dizzy with 2 falls prior to presentation to the ED.  CT head without any acute abnormalities to explain.  Suspected most likely secondary to his new onset A-fib.  He did not have any further episodes of dizziness/lightheadedness, syncope while hospitalized.  Atrial fibrillation (new onset) On admission patient found to be in A-fib.  He was started on heparin  which was ultimately transitioned to Eliquis 5 mg BID.  Echo was ordered which showed LVEF 60-65% mild calcification of aortic valve, and moderate dilatation of the ascending aorta to 42 mm.  CT PE was obtained for elevated D-dimer on admission; this showed no evidence of PE.  He remained asymptomatic during admission, and was stable at time of discharge.  He will need cardiology follow-up in the outpatient setting for further management.  Anemia Hemoglobin on arrival was low and then dropped further to 7.4 requiring 1U PRBCs given his history of MI.  This did elevate patient's hemoglobin appropriately, however it did immediately begin to drop again, and he received an additional transfusion.  Patient was without any overt evidence of bleeding, hemoccult negative. CT ordered which did not show any overt evidence of bleed.  GI reviewed patient but did not see reason for inpatient endoscopy.  Hemoglobin  stable time of discharge, and patient will have close follow-up for hemoglobin recheck in the outpatient setting.  AKI Creatinine elevated on admission.  Patient tolerated PO very well during hospitalization and creatinine improved.  AKI resolved by time of discharge.   PCP Follow up:  New onset a fib, make sure he is taking blood thinner and has follow-up with cardiology. Follow up dilated ascending aorta to 42 mm.  Needs hemoglobin recheck in 1 to 2 days - pt instructed to go to [facility].  May be candidate for outpatient transfusion as needed at the cancer center.  Discharge Diagnoses/Problem List:  Principal Problem:   Near syncope Active Problems:   Atrial fibrillation (HCC)   Chronic health problem   Anemia   History of urostomy   Elevated d-dimer  Disposition: home  Discharge Condition: stable  Discharge Exam:  General: Well-appearing, pleasant, NAD Cardiovascular: RRR, no murmurs Respiratory: Normal work of breathing on room air Extremities: Moves all equally.  Skin warm and dry.  Significant Procedures:  Transfused 2U PRBCs (5/3, 5/5)  Significant Labs and Imaging:  Recent Labs  Lab 06/02/23 1545 06/03/23 0257 06/03/23 0450 06/03/23 1225 06/04/23 0416  WBC 8.3 8.6  --   --  10.4  HGB 8.8* 7.9* 7.8* 9.3* 8.5*  HCT 26.7* 24.3* 23.8* 28.2* 25.4*  PLT 308 259  --   --  260   Recent Labs  Lab 06/03/23 0257  NA 141  K 3.9  CL 112*  CO2 22  GLUCOSE 111*  BUN 23  CREATININE 1.34*  CALCIUM  9.1   5/3 CT head: IMPRESSION: 1. No acute intracranial abnormality or significant interval change. 2.  Remote Drennon matter infarct just above the atrium of the left lateral ventricle and extends into the left side of the posterior body of the corpus callosum is stable. 3. Remote lacunar infarcts in the anterior limb of the right internal capsule are stable. 4. Remote right paramedian pontine infarct is stable. 5. Periventricular Blaylock matter hypoattenuation is  moderately advanced for age. This likely reflects the sequela of chronic microvascular ischemia.  5/5 CTA PE, CT CAP: (CT CAP obtained as pt was meant to have one in the OP setting per Oncologist, Dr. Alita Irwin to follow up prior cystoprostatectomy for bladder cancer) IMPRESSION: 1. No embolism to the segmental pulmonary artery level. No acute thoracic aortic syndrome. 2. No acute inflammatory process identified within the abdomen or pelvis. 3. There are multiple peripheral wedge-shaped ill-defined hypoattenuating areas in bilateral kidneys, which are nonspecific but can be seen as a sequela of pyelonephritis. Correlate clinically and with urinalysis. 4. Status post cystoprostatectomy. Right upper quadrant urostomy noted. 5. Multiple other nonacute observations, as described above.  Results/Tests Pending at Time of Discharge: none  Discharge Medications:  Allergies as of 06/04/2023   No Known Allergies      Medication List     STOP taking these medications    chlorthalidone  25 MG tablet Commonly known as: HYGROTON    ibuprofen 800 MG tablet Commonly known as: ADVIL   losartan  100 MG tablet Commonly known as: COZAAR        TAKE these medications    acetaminophen  500 MG tablet Commonly known as: TYLENOL  Take 500 mg by mouth every 6 (six) hours as needed for moderate pain (pain score 4-6).   amLODipine  10 MG tablet Commonly known as: NORVASC  Take 1 tablet (10 mg total) by mouth daily.   blood glucose meter kit and supplies Kit Dispense based on patient and insurance preference. Check fasting blood sugar once daily ICD10 R73.03   diclofenac Sodium 1 % Gel Commonly known as: VOLTAREN Apply 4 g topically 4 (four) times daily as needed (pain).   Eliquis 5 MG Tabs tablet Generic drug: apixaban Take 1 tablet (5 mg total) by mouth 2 (two) times daily.   gabapentin  100 MG capsule Commonly known as: Neurontin  Take 1 capsule (100 mg total) by mouth at bedtime for 3 days, THEN 2  capsules (200 mg total) at bedtime. Start taking on: March 21, 2023 What changed: See the new instructions.   glucose blood test strip Use as instructed to monitor FSBS 1x daily. Dx: R73.09   labetalol  200 MG tablet Commonly known as: NORMODYNE  TAKE 1 TABLET(200 MG) BY MOUTH TWICE DAILY   magnesium  chloride 64 MG Tbec SR tablet Commonly known as: SLOW-MAG Take 1 tablet by mouth daily.   Mounjaro  12.5 MG/0.5ML Pen Generic drug: tirzepatide  Inject 12.5 mg into the skin once a week. On Monday   One-A-Day Mens 50+ Advantage Tabs Take 1 tablet by mouth daily.   onetouch ultrasoft lancets Use as instructed   pantoprazole 40 MG tablet Commonly known as: Protonix Take 1 tablet (40 mg total) by mouth 2 (two) times daily.   Vitamin D3 Maximum Strength 125 MCG (5000 UT) capsule Generic drug: Cholecalciferol Take 5,000 Units by mouth daily.        Discharge Instructions: Please refer to Patient Instructions section of EMR for full details.  Patient was counseled important signs and symptoms that should prompt return to medical care, changes in medications, dietary instructions, activity restrictions, and follow up appointments.   Follow-Up Appointments:  Follow-up Information  Care, [facility] Follow up.   Specialty: Home Health Services Why: Someone will call you to schedule resumption of care visit. Contact information: [address] [place] [address] [phone]                Future Appointments  Date Time Provider Department Center  06/04/2023  5:00 PM [facility] VASC US  2 [facility]  07/29/2023 10:00 AM [facility] LAB [facility] None  08/05/2023  9:10 AM [facility] ANNUAL WELLNESS VISIT [facility]  08/15/2023 11:00 AM Lowanda Ruddy, MD [facility] None  09/24/2023 10:30 AM Terrilyn Fick, NP [facility] None    Omar Bibber, DO 06/04/2023, 1:19 PM PGY-1, [facility] Family [facility]

## 2023-06-04 NOTE — Progress Notes (Signed)
 PHARMACY - ANTICOAGULATION CONSULT NOTE  Pharmacy Consult for heparin  Indication: atrial fibrillation  No Known Allergies  Patient Measurements: Height: 6\' 9"  (205.7 cm) Weight: 122.1 kg (269 lb 2.9 oz) IBW/kg (Calculated) : 98.3 HEPARIN  DW (KG): 122.1  Vital Signs: Temp: 97.7 F (36.5 C) (05/06 0844) Temp Source: Oral (05/06 0555) BP: 146/96 (05/06 0844) Pulse Rate: 93 (05/06 0844)  Labs: Recent Labs    06/02/23 0243 06/02/23 1545 06/03/23 0257 06/03/23 0450 06/03/23 1225 06/04/23 0416  HGB 8.2* 8.8* 7.9* 7.8* 9.3* 8.5*  HCT 24.4* 26.7* 24.3* 23.8* 28.2* 25.4*  PLT 262 308 259  --   --  260  HEPARINUNFRC 0.55  --  0.41  --  0.71* 0.67  CREATININE 1.45*  --  1.34*  --   --   --     Estimated Creatinine Clearance: 88.3 mL/min (A) (by C-G formula based on SCr of 1.34 mg/dL (H)).   Assessment: 63 y.o. male admitted with syncope and new onset Afib. Pharmacy consulted to manage IV heparin .  Heparin  level above goal on 2050 units/hr. No overt bleeding noted, but Hgb continues to trend down requiring transfusion of 2 units RBC for anemia.  Hgb 8.5, platelets are normal.    Goal of Therapy:  Heparin  level 0.3-0.5 units/ml due to anemia Monitor platelets by anticoagulation protocol: Yes  Plan:  Reduce heparin  drip to 1950 units/hr Daily heparin  level and CBC Monitor for s/sx of bleeding  Thank you for involving pharmacy in this patient's care.  [employer], Pharm.D., BCPS Clinical Pharmacist Clinical phone for 06/04/2023 from 7:30-3:00 is [phone].  **Pharmacist phone directory can be found on [email or web address] listed under [facility].  06/04/2023 10:19 AM

## 2023-06-04 NOTE — Assessment & Plan Note (Deleted)
 No further episodes during hospitalization.  Denies dizziness.  Still no clear explanation for this but strongly suspect related to new onset A-fib as below. - PT/OT eval and treat - AM CBC, BMP

## 2023-06-04 NOTE — Progress Notes (Signed)
 DISCHARGE NOTE HOME Brad [name] to be discharged Home per MD order. Discussed prescriptions and follow up appointments with the patient. Prescriptions given to patient; medication list explained in detail. Patient verbalized understanding.  Skin clean, dry and intact without evidence of skin break down, no evidence of skin tears noted. IV catheter discontinued intact. Site without signs and symptoms of complications. Dressing and pressure applied. Pt denies pain at the site currently. No complaints noted.  Patient free of lines, drains, and wounds.   An After Visit Summary (AVS) was printed and given to the patient. Patient escorted via wheelchair, and discharged home via private auto.  Elvina Hammers, RN

## 2023-06-04 NOTE — Plan of Care (Signed)
  Problem: Education: Goal: Knowledge of General Education information will improve Description: Including pain rating scale, medication(s)/side effects and non-pharmacologic comfort measures Outcome: Completed/Met   Problem: Education: Goal: Knowledge of General Education information will improve Description: Including pain rating scale, medication(s)/side effects and non-pharmacologic comfort measures Outcome: Completed/Met

## 2023-06-04 NOTE — Care Management Important Message (Signed)
 Important Message  Patient Details  Name: Brad [name] MRN: [id number] Date of Birth: [birth year]   Important Message Given:  Yes - Medicare IM     Wynonia Hedges 06/04/2023, 3:41 PM

## 2023-06-04 NOTE — Progress Notes (Signed)
 Occupational Therapy Treatment Patient Details Name: Brad [name] MRN: [id number] DOB: December 30, 1960 Today's Date: 06/04/2023   History of present illness The pt is a 63 yo male presenting 5/2 after 2 near syncopal episodes. Admitted for medical management, work up revealed pt hypotensive upon arrival, new dx of afib, UA suggestive of UTI, and AKI. PMH includes: bladder cancer, DM II, MI in 2016, CVA in 2016, HTN, OSA.   OT comments  Pt progressing toward goals this session, needing supervision overall for ADLs, and CGA for transfers/hallway ambulation. Pt performing simulated tub transfer without assist. Pt still occasionally reaching for external support with ambulation. Pt presenting with impairments listed below, will follow acutely. Anticipate no OT follow up needs at d/c.       If plan is discharge home, recommend the following:  A little help with walking and/or transfers;A little help with bathing/dressing/bathroom;Assistance with cooking/housework;Assist for transportation;Help with stairs or ramp for entrance;Supervision due to cognitive status   Equipment Recommendations  Tub/shower seat    Recommendations for Other Services PT consult    Precautions / Restrictions Precautions Precautions: Fall Recall of Precautions/Restrictions: Intact Precaution/Restrictions Comments: watch HR and BP Restrictions Weight Bearing Restrictions Per Provider Order: No       Mobility Bed Mobility               General bed mobility comments: EOB upon arrival and departure    Transfers Overall transfer level: Modified independent Equipment used: None Transfers: Sit to/from Stand Sit to Stand: Contact guard assist, Supervision                 Balance Overall balance assessment: Needs assistance Sitting-balance support: No upper extremity supported Sitting balance-Leahy Scale: Good     Standing balance support: Bilateral upper extremity supported, During functional  activity Standing balance-Leahy Scale: Good                             ADL either performed or assessed with clinical judgement   ADL Overall ADL's : Needs assistance/impaired     Grooming: Supervision/safety;Oral care;Standing                   Toilet Transfer: Supervision/safety;Ambulation;Regular Toilet       Tub/ Engineer, structural: Tub transfer;Supervision/safety   Functional mobility during ADLs: Supervision/safety      Extremity/Trunk Assessment Upper Extremity Assessment Upper Extremity Assessment: Overall WFL for tasks assessed   Lower Extremity Assessment Lower Extremity Assessment: Defer to PT evaluation        Vision   Vision Assessment?: No apparent visual deficits   Perception Perception Perception: Not tested   Praxis Praxis Praxis: Not tested   Communication Communication Communication: No apparent difficulties   Cognition Arousal: Alert Behavior During Therapy: Flat affect, WFL for tasks assessed/performed Cognition: No apparent impairments                               Following commands: Intact        Cueing   Cueing Techniques: Verbal cues  Exercises      Shoulder Instructions       General Comments VSS    Pertinent Vitals/ Pain       Pain Assessment Pain Assessment: No/denies pain  Home Living  Prior Functioning/Environment              Frequency  Min 2X/week        Progress Toward Goals  OT Goals(current goals can now be found in the care plan section)  Progress towards OT goals: Progressing toward goals  Acute Rehab OT Goals Patient Stated Goal: none stated OT Goal Formulation: With patient Time For Goal Achievement: 06/16/23 Potential to Achieve Goals: Good ADL Goals Pt Will Perform Upper Body Dressing: with modified independence;sitting Pt Will Perform Lower Body Dressing: with modified  independence;sitting/lateral leans;sit to/from stand Pt Will Transfer to Toilet: with modified independence;ambulating;regular height toilet Pt Will Perform Tub/Shower Transfer: Tub transfer;Shower transfer;with modified independence;ambulating Additional ADL Goal #1: pt will tolerate OOB activity x10 min with VSS in order to improve activity tolerance for ADLs  Plan      Co-evaluation                 AM-PAC OT "6 Clicks" Daily Activity     Outcome Measure   Help from another person eating meals?: A Little Help from another person taking care of personal grooming?: A Little Help from another person toileting, which includes using toliet, bedpan, or urinal?: A Little Help from another person bathing (including washing, rinsing, drying)?: A Little Help from another person to put on and taking off regular upper body clothing?: A Little Help from another person to put on and taking off regular lower body clothing?: A Little 6 Click Score: 18    End of Session Equipment Utilized During Treatment: Gait belt  OT Visit Diagnosis: Unsteadiness on feet (R26.81);Other abnormalities of gait and mobility (R26.89);Muscle weakness (generalized) (M62.81)   Activity Tolerance Patient tolerated treatment well   Patient Left in bed;with call bell/phone within reach   Nurse Communication Mobility status        Time: 1610-9604 OT Time Calculation (min): 15 min  Charges: OT General Charges $OT Visit: 1 Visit OT Treatments $Self Care/Home Management : 8-22 mins  Bela Nyborg K, OTD, OTR/L SecureChat Preferred Acute Rehab [phone]   Benedict Brain Koonce 06/04/2023, 12:24 PM

## 2023-06-04 NOTE — Assessment & Plan Note (Deleted)
 UA negative for acute infection.  Culture returned with E. Coli; in conversation with Dr. Aden Agreste as above, this is most likely colonization and patient does not require further antibiotic therapy as he is asymptomatic.

## 2023-06-05 ENCOUNTER — Other Ambulatory Visit: Payer: Self-pay

## 2023-06-05 DIAGNOSIS — D649 Anemia, unspecified: Secondary | ICD-10-CM

## 2023-06-05 LAB — CBC
Hematocrit: 29.5 % — ABNORMAL LOW (ref 37.5–51.0)
Hemoglobin: 9.9 g/dL — ABNORMAL LOW (ref 13.0–17.7)
MCH: 28.1 pg (ref 26.6–33.0)
MCHC: 33.6 g/dL (ref 31.5–35.7)
MCV: 84 fL (ref 79–97)
Platelets: 318 10*3/uL (ref 150–450)
RBC: 3.52 x10E6/uL — ABNORMAL LOW (ref 4.14–5.80)
RDW: 14.5 % (ref 11.6–15.4)
WBC: 10 10*3/uL (ref 3.4–10.8)

## 2023-06-05 NOTE — Progress Notes (Signed)
 [facility] needed order replaced for CBC per Dr Andree Kayser.

## 2023-06-06 ENCOUNTER — Other Ambulatory Visit: Payer: Self-pay

## 2023-06-06 DIAGNOSIS — I152 Hypertension secondary to endocrine disorders: Secondary | ICD-10-CM

## 2023-06-06 MED ORDER — LABETALOL HCL 200 MG PO TABS: ORAL_TABLET | ORAL | 0 refills | Status: DC

## 2023-06-10 ENCOUNTER — Ambulatory Visit: Admitting: Student

## 2023-06-10 ENCOUNTER — Encounter: Payer: Self-pay | Admitting: Cardiovascular Disease

## 2023-06-10 ENCOUNTER — Encounter: Payer: Self-pay | Admitting: Student

## 2023-06-10 VITALS — BP 122/78 | HR 83 | Ht >= 80 in | Wt 288.0 lb

## 2023-06-10 DIAGNOSIS — D649 Anemia, unspecified: Secondary | ICD-10-CM

## 2023-06-10 DIAGNOSIS — N179 Acute kidney failure, unspecified: Secondary | ICD-10-CM

## 2023-06-10 DIAGNOSIS — I480 Paroxysmal atrial fibrillation: Secondary | ICD-10-CM | POA: Diagnosis not present

## 2023-06-10 NOTE — Progress Notes (Signed)
    SUBJECTIVE:   CHIEF COMPLAINT / HPI: Hospital fu  Discussed the use of AI scribe software for clinical note transcription with the patient, who gave verbal consent to proceed.  Admitted 5/3 to 5/6 for presyncope and 2 falls prior to ED presentation.  Developed new onset atrial fibrillation.  Thought to be the cause.  Was started on anticoagulation in the hospital.  Did have anemia and was transfused 1 unit PRBC.  AKI at time of discharge was resolved.  PCP Follow up:  New onset a fib, make sure he is taking blood thinner and has follow-up with cardiology. Follow up dilated ascending aorta to 42 mm.  Needs hemoglobin recheck in 1 to 2 days - pt instructed to go to [facility].  May be candidate for outpatient transfusion as needed at the cancer center.  History of Present Illness Brad [name] is a 63 year old male with new onset atrial fibrillation who presents for follow-up after hospitalization.  He experienced dizziness and severe headaches for two weeks prior to a fall, which led to the hospitalization for atrial fibrillation. Since starting treatment, he has not had further falls. He is on Eliquis  and reports no current symptoms such as chest pain, palpitations, or dizziness during physical activity.  He had a blood transfusion during his hospital stay due to low hemoglobin levels, with stable levels at 9.9 on May 7th. His previous medications, losartan  and chlorthalidone , have been discontinued. His blood pressure is well-controlled on labetalol  and amlodipine .  He inquires about resuming physical activities like exercising at the gym and cutting grass, which he has avoided since hospitalization. He reports no symptoms of belly pain, vomiting blood, or dark stools.  He does not want to be on PPI long-term due to side effects and asked questions about this.   PERTINENT  PMH / PSH: HTN, NSTEMI  OBJECTIVE:   BP 122/78   Pulse 83   Ht 6\' 9"  (2.057 m)   Wt 288 lb (130.6 kg)    SpO2 98%   BMI 30.86 kg/m   General: Well appearing, NAD, awake, alert, responsive to questions Head: Normocephalic atraumatic Respiratory: chest rises symmetrically,  no increased work of breathing  ASSESSMENT/PLAN:   Assessment & Plan Paroxysmal atrial fibrillation (HCC) New onset atrial fibrillation with dizziness and falls leading to recent hospitalization. Explained stroke risk and importance of Eliquis . Rate controlled - Continue Eliquis  as prescribed. - Recheck CBC and BMP today. - Follow up with cardiology for further evaluation and potential procedure discussion. Anemia, unspecified type Protonix  initiated for GI bleed prophylaxis due to low hemoglobin. No current GI bleeding symptoms.  - Discontinue Protonix  (precautions discussed) - GI referral to consider scope - CBC AKI (acute kidney injury) (HCC) Eating/drinking well. - BMP   AMayuri Leocadia Rains, MD [facility]

## 2023-06-10 NOTE — Progress Notes (Unsigned)
 Cardiology Office Note   Date:  06/11/2023   ID:  Brad [name], DOB [birth year], MRN [id number]  PCP:  Genora Kidd, MD  Cardiologist:   Ahmad Alert, MD   Chief Complaint  Patient presents with   Hypertension        Problem list: 1. 1. Malignant hypertension -    2. Intracranial hemorrhage caused by malignant hypertension   3. Non-ST segment elevation myocardial infarction-in the setting of malignant hypertension,  Echo shows normal LF function with EF of 60-65% with no segmental wall abn.     Brad [name] is a 63 y.o. male who presents for follow up for malignant hypertension and a type 2 non-ST segment myocardial infarction.  He's been doing fairly well from a cardiac point. He's not had any episodes of chest discomfort. He's currently in rehabilitation for intracranial hemorrhage. His blood pressure is much better controlled.  He has lost quite a bit of weight.   Oct. 26, 2016:  No Cp or dyspnea.  BP is fairly well controlled.   May 23, 2015:  Eliam is doing well.  BP is well controlled. Is recovering from his CVA  Walking    OCt. 23, 2017:   No CP or dyspnea.  Echo  Form [place], May 06, 2014 - Normal left ventricle systolic function - ejection fraction 60-65%. He has concentric left ventricular hypertrophy.  Works out every day   Jan. 23, 2018:  Feeling well.   Works out every day . Goes to the gym every day  Weight today is 329.  Sept. 13, 2018 Wt = 332 Brad [name] is seen today  BP is well controlled.  No CP or dyspnea  Had stopped the Aldactone ,  Is back on it now Is ready to go on a diet - HbA1C is up  Labs from 9/11 look good  October 11, 2017: Has been working out regularly  Had lost some weight but has gained some back   November 11, 2018: Brad [name] is seen today for follow-up visit.  He has a history of hypertension.  Has had an intracranial bleed and a NSTEMI .  Wants to see a nutritionist.  Just getting back to the gym .   Has been walking   November 24, 2019: Artery seen today for follow-up visit.  His blood pressure is well controlled.  Weight today is 232 pounds. He has a history of a non-ST segment elevation myocardial infarction and also has a history of an intracranial bleed. Is exercising regularly .  Is going to [employer] weight loss program.  Knees are feeling better with weigh loss  Oct. 31, 2022: Brad [name] is seen today for follow up fo his HTN, NSTEMI, obesity Wt is 333 lbs  Still goes to the gym Exercising 6 days a week .   Wants to improve his eating  Has started takeing Mounjaro  for weight loss    Dec. 18, 2023 Brad [name] is seen for follow up visit, hx of HTN, NSTEMI,  Wt is 336 lbs   Tries to avoid salt . Goes to the gym regularly   Jun 11, 2023 Brad [name] is seen for follow up visit of HTN, NSTEMI, morbid obesity  Wt is   Was hospitalized with near syncpe on May 31, 2023 with symptoms of dizziness .  Was orthostatic  He fell while getting out of the car.  Then he went to the hospital  Found to have atrial fib ( CHADS2VASC is 4) CTA of the  lungs on May 5 ,2025 showed no evidence of PE   Is on eliquis  5 BID   Has had bladder cancer, had bladder removed   Has OSA , wears his mask   The lightheadedness has resolved.  He has converted back to NSR as of today  He has no restrictions in his activity   He works as a Education officer, environmental          Past Medical History:  Diagnosis Date   Abnormal ultrasound of carotid artery 04/2014   no significant obstruction   Ataxia 04/2014   Cancer (HCC)    Diabetes (HCC)    EKG abnormalities 04/2014   poor R wave progression, NSR   Former smoker    25 years x 1.5 ppd, stopped 04/2014   Gait disturbance 04/2014   s/p stroke   H/O echocardiogram 04/2014   moderate LVH, 60-65% EF, no valve disease   History of heart attack    Hypertension    Hypertensive heart disease 04/2014   Intraparenchymal hemorrhage of brain [facility]) 05/05/2014    hospitalization [facility]   Myocardial infarction [facility])    Obesity    Prediabetes    Short-term memory loss 04/2014   Sleep apnea    Stroke (HCC) 04/2014    Past Surgical History:  Procedure Laterality Date   APPENDECTOMY     CHOLECYSTECTOMY     CIRCUMCISION     CYSTOSCOPY N/A 09/26/2022   Procedure: CYSTOSCOPY;  Surgeon: Adelbert Homans, MD;  Location: [facility];  Service: Urology;  Laterality: N/A;  60 MINUTES   HEMORRHOID SURGERY     IR IMAGING GUIDED PORT INSERTION  11/08/2022   ROBOT LAP RADICAL CYSTOPROSTATECTOMY PELVIC LYMPHADENECTOMY, NEOBLADDER N/A 03/29/2023   Procedure: CYSTOSCOPY WITH ICG/ROBOT ASSISTED LAPAROSCOPIC RADICAL CYSTOPROSTATECTOMY;  Surgeon: Melody Spurling., MD;  Location: [facility];  Service: Urology;  Laterality: N/A;     Current Outpatient Medications  Medication Sig Dispense Refill   acetaminophen  (TYLENOL ) 500 MG tablet Take 500 mg by mouth every 6 (six) hours as needed for moderate pain (pain score 4-6).     amLODipine  (NORVASC ) 10 MG tablet Take 1 tablet (10 mg total) by mouth daily. 90 tablet 3   apixaban  (ELIQUIS ) 5 MG TABS tablet Take 1 tablet (5 mg total) by mouth 2 (two) times daily. 60 tablet 0   blood glucose meter kit and supplies KIT Dispense based on patient and insurance preference. Check fasting blood sugar once daily ICD10 R73.03 1 each 0   Cholecalciferol (VITAMIN D3 MAXIMUM STRENGTH) 125 MCG (5000 UT) capsule Take 5,000 Units by mouth daily.     diclofenac  Sodium (VOLTAREN ) 1 % GEL Apply 4 g topically 4 (four) times daily as needed (pain).     gabapentin  (NEURONTIN ) 100 MG capsule Take 1 capsule (100 mg total) by mouth at bedtime for 3 days, THEN 2 capsules (200 mg total) at bedtime. (Patient taking differently: Take 100 mg at bedtime) 63 capsule 0   glucose blood test strip Use as instructed to monitor FSBS 1x daily. Dx: R73.09 100 each 12   labetalol  (NORMODYNE ) 200 MG tablet TAKE 1 TABLET(200 MG) BY MOUTH TWICE  DAILY 180 tablet 0   Lancets (ONETOUCH ULTRASOFT) lancets Use as instructed 100 each 12   magnesium  chloride (SLOW-MAG) 64 MG TBEC SR tablet Take 1 tablet by mouth daily.     MOUNJARO  12.5 MG/0.5ML Pen Inject 12.5 mg into the skin once a week. On Monday  Multiple Vitamins-Minerals (ONE-A-DAY MENS 50+ ADVANTAGE) TABS Take 1 tablet by mouth daily.     pantoprazole  (PROTONIX ) 40 MG tablet Take 1 tablet (40 mg total) by mouth 2 (two) times daily. 60 tablet 0   No current facility-administered medications for this visit.    Allergies:   Patient has no known allergies.    Social History:  The patient  reports that he quit smoking about 9 years ago. His smoking use included cigarettes. He started smoking about 34 years ago. He has a 37.5 pack-year smoking history. He has been exposed to tobacco smoke. He has never used smokeless tobacco. He reports that he does not drink alcohol and does not use drugs.   Family History:  The patient's family history includes Aneurysm in his paternal grandmother; Heart disease in his mother; Hypertension in his brother and mother; Sudden death in his mother.    ROS:  Please see the history of present illness.      Physical Exam: Blood pressure 124/79, pulse 82, height 6\' 9"  (2.057 m), weight 292 lb 12.8 oz (132.8 kg), SpO2 98%.       GEN:  tall, middle aged black man ,  in no acute distress HEENT: Normal NECK: No JVD; No carotid bruits LYMPHATICS: No lymphadenopathy CARDIAC: RRR , no murmurs, rubs, gallops RESPIRATORY:  Clear to auscultation without rales, wheezing or rhonchi  ABDOMEN: Soft, non-tender, non-distended MUSCULOSKELETAL:  No edema; No deformity  SKIN: Warm and dry NEUROLOGIC:  Alert and oriented x 3     EKG:    EKG Interpretation Date/Time:  Tuesday Jun 11 2023 12:03:39 EDT Ventricular Rate:  78 PR Interval:  158 QRS Duration:  84 QT Interval:  396 QTC Calculation: 451 R Axis:   -15  Text Interpretation: Normal sinus rhythm  Inferior infarct , age undetermined When compared with ECG of 31-May-2023 20:57, the patient has converted back to NSR since his previous ECG Confirmed by Ahmad Alert [phone]) on 06/11/2023 12:06:45 PM     Recent Labs: 05/28/2023: Magnesium  1.6 05/31/2023: ALT 32 06/01/2023: TSH 1.217 06/10/2023: BUN 20; Creatinine, Ser 1.34; Hemoglobin 9.3; Platelets 282; Potassium 4.3; Sodium 141    Lipid Panel    Component Value Date/Time   CHOL 102 09/06/2021 1021   CHOL 84 (L) 10/01/2014 0001   TRIG 43 09/06/2021 1021   TRIG 51 10/01/2014 0001   HDL 39 (L) 09/06/2021 1021   HDL 40 10/01/2014 0001   CHOLHDL 2.6 09/06/2021 1021   CHOLHDL 2.8 06/07/2017 0926   VLDL 9 11/21/2015 1124   LDLCALC 52 09/06/2021 1021   LDLCALC 61 06/07/2017 0926   LDLCALC 34 10/01/2014 0001      Wt Readings from Last 3 Encounters:  06/11/23 292 lb 12.8 oz (132.8 kg)  06/10/23 288 lb (130.6 kg)  06/01/23 269 lb 2.9 oz (122.1 kg)      Other studies Reviewed: Additional studies/ records that were reviewed today include: . Review of the above records demonstrates:    ASSESSMENT AND PLAN:  1.  Malignant hypertension - BP is well controlled.  .  2. Intracranial hemorrhage    - stable.  Occurred in the setting of malignant htn  Is tolerating the eliquis  well   3. Non-ST segment elevation myocardial infarction-  No angina  4.  Atrial fib:   was seen at the hospital with lightheadedness and was found to have atrial fib  Was started in Heparin , then eliquis    He is feeling much better at this point  ECG shows that he is now back in NSR           Current medicines are reviewed at length with the patient today.  The patient does not have concerns regarding medicines.  The following changes have been made:  no change  Labs/ tests ordered today include:   Orders Placed This Encounter  Procedures   EKG 12-Lead     Disposition:   FU with me in 1 year    Ahmad Alert, MD  06/11/2023 12:06 PM     [facility] [address] [place], [place], [place]  [address] Phone: [phone]; Fax: [phone]

## 2023-06-10 NOTE — Patient Instructions (Addendum)
 It was great to see you! Thank you for allowing me to participate in your care!   Our plans for today:  - I am getting labs today  - You may stop the protonix , would recommend follow up with GI doctors - Follow up with cardiology! - I will be leaving in July, you can see me at end of June if you would like before I leave!  Take care and seek immediate care sooner if you develop any concerns.  Genora Kidd, MD

## 2023-06-10 NOTE — Assessment & Plan Note (Signed)
 New onset atrial fibrillation with dizziness and falls leading to recent hospitalization. Explained stroke risk and importance of Eliquis . Rate controlled - Continue Eliquis  as prescribed. - Recheck CBC and BMP today. - Follow up with cardiology for further evaluation and potential procedure discussion.

## 2023-06-10 NOTE — Assessment & Plan Note (Signed)
 Protonix  initiated for GI bleed prophylaxis due to low hemoglobin. No current GI bleeding symptoms.  - Discontinue Protonix  (precautions discussed) - GI referral to consider scope - CBC

## 2023-06-11 ENCOUNTER — Ambulatory Visit: Attending: Cardiovascular Disease | Admitting: Cardiovascular Disease

## 2023-06-11 ENCOUNTER — Encounter: Payer: Self-pay | Admitting: Cardiovascular Disease

## 2023-06-11 VITALS — BP 124/79 | HR 82 | Ht >= 80 in | Wt 292.8 lb

## 2023-06-11 DIAGNOSIS — I480 Paroxysmal atrial fibrillation: Secondary | ICD-10-CM | POA: Diagnosis not present

## 2023-06-11 DIAGNOSIS — I10 Essential (primary) hypertension: Secondary | ICD-10-CM

## 2023-06-11 DIAGNOSIS — I214 Non-ST elevation (NSTEMI) myocardial infarction: Secondary | ICD-10-CM

## 2023-06-11 DIAGNOSIS — G4733 Obstructive sleep apnea (adult) (pediatric): Secondary | ICD-10-CM | POA: Diagnosis not present

## 2023-06-11 LAB — BASIC METABOLIC PANEL WITH GFR
BUN/Creatinine Ratio: 15 (ref 10–24)
BUN: 20 mg/dL (ref 8–27)
CO2: 23 mmol/L (ref 20–29)
Calcium: 9.5 mg/dL (ref 8.6–10.2)
Chloride: 105 mmol/L (ref 96–106)
Creatinine, Ser: 1.34 mg/dL — ABNORMAL HIGH (ref 0.76–1.27)
Glucose: 73 mg/dL (ref 70–99)
Potassium: 4.3 mmol/L (ref 3.5–5.2)
Sodium: 141 mmol/L (ref 134–144)
eGFR: 60 mL/min/{1.73_m2} (ref >59)

## 2023-06-11 LAB — CBC
Hematocrit: 28.8 % — ABNORMAL LOW (ref 37.5–51.0)
Hemoglobin: 9.3 g/dL — ABNORMAL LOW (ref 13.0–17.7)
MCH: 28.2 pg (ref 26.6–33.0)
MCHC: 32.3 g/dL (ref 31.5–35.7)
MCV: 87 fL (ref 79–97)
Platelets: 282 10*3/uL (ref 150–450)
RBC: 3.3 x10E6/uL — ABNORMAL LOW (ref 4.14–5.80)
RDW: 15.3 % (ref 11.6–15.4)
WBC: 8.4 10*3/uL (ref 3.4–10.8)

## 2023-06-11 NOTE — Patient Instructions (Signed)
 Follow-Up: At [facility], you and your health needs are our priority.  As part of our continuing mission to provide you with exceptional heart care, our providers are all part of one team.  This team includes your primary Cardiologist (physician) and Advanced Practice Providers or APPs (Physician Assistants and Nurse Practitioners) who all work together to provide you with the care you need, when you need it.  Your next appointment:   6 month(s)  Provider:   Ahmad Alert, MD Lavonia Powers Renna Cary, MD

## 2023-06-12 ENCOUNTER — Ambulatory Visit: Payer: Self-pay | Admitting: Student

## 2023-06-14 ENCOUNTER — Other Ambulatory Visit: Payer: Self-pay

## 2023-06-14 DIAGNOSIS — Z95828 Presence of other vascular implants and grafts: Secondary | ICD-10-CM

## 2023-06-16 ENCOUNTER — Emergency Department (HOSPITAL_COMMUNITY)
Admission: EM | Admit: 2023-06-16 | Discharge: 2023-06-16 | Disposition: A | Discharge status: Home / Self Care | Attending: Emergency Medicine | Admitting: Emergency Medicine

## 2023-06-16 ENCOUNTER — Other Ambulatory Visit: Payer: Self-pay

## 2023-06-16 ENCOUNTER — Encounter (HOSPITAL_COMMUNITY): Payer: Self-pay

## 2023-06-16 ENCOUNTER — Emergency Department (HOSPITAL_COMMUNITY)

## 2023-06-16 DIAGNOSIS — M542 Cervicalgia: Secondary | ICD-10-CM | POA: Insufficient documentation

## 2023-06-16 DIAGNOSIS — Z7901 Long term (current) use of anticoagulants: Secondary | ICD-10-CM | POA: Diagnosis not present

## 2023-06-16 DIAGNOSIS — R5383 Other fatigue: Secondary | ICD-10-CM | POA: Insufficient documentation

## 2023-06-16 DIAGNOSIS — N390 Urinary tract infection, site not specified: Secondary | ICD-10-CM

## 2023-06-16 DIAGNOSIS — D72829 Elevated white blood cell count, unspecified: Secondary | ICD-10-CM | POA: Insufficient documentation

## 2023-06-16 LAB — CBC WITH DIFFERENTIAL/PLATELET
Abs Immature Granulocytes: 0.08 10*3/uL — ABNORMAL HIGH (ref 0.00–0.07)
Basophils Absolute: 0 10*3/uL (ref 0.0–0.1)
Basophils Relative: 0 %
Eosinophils Absolute: 0 10*3/uL (ref 0.0–0.5)
Eosinophils Relative: 0 %
HCT: 29.3 % — ABNORMAL LOW (ref 39.0–52.0)
Hemoglobin: 9.6 g/dL — ABNORMAL LOW (ref 13.0–17.0)
Immature Granulocytes: 1 %
Lymphocytes Relative: 7 %
Lymphs Abs: 1.1 10*3/uL (ref 0.7–4.0)
MCH: 28.7 pg (ref 26.0–34.0)
MCHC: 32.8 g/dL (ref 30.0–36.0)
MCV: 87.7 fL (ref 80.0–100.0)
Monocytes Absolute: 1.7 10*3/uL — ABNORMAL HIGH (ref 0.1–1.0)
Monocytes Relative: 11 %
Neutro Abs: 11.9 10*3/uL — ABNORMAL HIGH (ref 1.7–7.7)
Neutrophils Relative %: 81 %
Platelets: 223 10*3/uL (ref 150–400)
RBC: 3.34 MIL/uL — ABNORMAL LOW (ref 4.22–5.81)
RDW: 15.9 % — ABNORMAL HIGH (ref 11.5–15.5)
WBC: 14.9 10*3/uL — ABNORMAL HIGH (ref 4.0–10.5)
nRBC: 0 % (ref 0.0–0.2)

## 2023-06-16 LAB — BASIC METABOLIC PANEL WITH GFR
Anion gap: 10 (ref 5–15)
BUN: 21 mg/dL (ref 8–23)
CO2: 20 mmol/L — ABNORMAL LOW (ref 22–32)
Calcium: 9.2 mg/dL (ref 8.9–10.3)
Chloride: 103 mmol/L (ref 98–111)
Creatinine, Ser: 1.48 mg/dL — ABNORMAL HIGH (ref 0.61–1.24)
GFR, Estimated: 53 mL/min — ABNORMAL LOW (ref >60)
Glucose, Bld: 188 mg/dL — ABNORMAL HIGH (ref 70–99)
Potassium: 3.7 mmol/L (ref 3.5–5.1)
Sodium: 133 mmol/L — ABNORMAL LOW (ref 135–145)

## 2023-06-16 LAB — URINALYSIS, ROUTINE W REFLEX MICROSCOPIC
Bilirubin Urine: NEGATIVE
Glucose, UA: NEGATIVE mg/dL
Ketones, ur: NEGATIVE mg/dL
Nitrite: NEGATIVE
Protein, ur: 100 mg/dL — AB
Specific Gravity, Urine: 1.012 (ref 1.005–1.030)
WBC, UA: >50 WBC/hpf (ref 0–5)
pH: 6 (ref 5.0–8.0)

## 2023-06-16 MED ORDER — SULFAMETHOXAZOLE-TRIMETHOPRIM 800-160 MG PO TABS
1.0000 | ORAL_TABLET | Freq: Two times a day (BID) | ORAL | 0 refills | Status: AC
Start: 2023-06-16 — End: 2023-06-23

## 2023-06-16 MED ORDER — IOHEXOL 350 MG/ML SOLN
75.0000 mL | Freq: Once | INTRAVENOUS | Status: AC | PRN
  Administered 2023-06-16: 75 mL via INTRAVENOUS

## 2023-06-16 NOTE — ED Triage Notes (Signed)
 Pt states for the past few weeks he has been feeling tired and fatigued and having neck pain on the right side. Took Tylenol  and BC w/o relief. Told he might be anemic and supposed to get blood work drawn for it. Denies any chest pain or sob, n/v/d.

## 2023-06-16 NOTE — ED Provider Notes (Signed)
 [place] EMERGENCY DEPARTMENT AT [facility] Provider Note   CSN: [id number] Arrival date & time: 06/16/23  4401     History  Chief Complaint  Patient presents with   Fatigue    Brad [name] is a 63 y.o. male.  HPI   63 year old male presents to the emergency department with fatigue, generalized weakness ongoing for the past few weeks.  He has also been having intermittent right sided neck pain described as an achiness, at times sharp.  Wax and wanes in severity.  Sometimes worse with head movement but usually nothing specifically elicits the pain.  He has intermittently had a headache but this is not currently present.  Denies any acute neurologic symptoms, facial droop, speech change, vision difficulties.  Pain starts just above his right clavicle and radiates up the neck and sometimes posteriorly around the neck.  No neck injury.  No chest pain, difficulty breathing, back pain.  Home Medications Prior to Admission medications   Medication Sig Start Date End Date Taking? Authorizing Provider  acetaminophen  (TYLENOL ) 500 MG tablet Take 500 mg by mouth every 6 (six) hours as needed for moderate pain (pain score 4-6).    [provider]  amLODipine  (NORVASC ) 10 MG tablet Take 1 tablet (10 mg total) by mouth daily. 12/15/20   Jonn Nett, DO  apixaban  (ELIQUIS ) 5 MG TABS tablet Take 1 tablet (5 mg total) by mouth 2 (two) times daily. 06/04/23   Albin Huh, MD  blood glucose meter kit and supplies KIT Dispense based on patient and insurance preference. Check fasting blood sugar once daily ICD10 R73.03 10/11/16   Mathis Som, MD  Cholecalciferol (VITAMIN D3 MAXIMUM STRENGTH) 125 MCG (5000 UT) capsule Take 5,000 Units by mouth daily.    [provider]  diclofenac  Sodium (VOLTAREN ) 1 % GEL Apply 4 g topically 4 (four) times daily as needed (pain). 06/04/23   Albin Huh, MD  gabapentin  (NEURONTIN ) 100 MG capsule Take 1 capsule (100 mg total) by mouth  at bedtime for 3 days, THEN 2 capsules (200 mg total) at bedtime. Patient taking differently: Take 100 mg at bedtime 03/21/23 06/11/23  Lowanda Ruddy, MD  glucose blood test strip Use as instructed to monitor FSBS 1x daily. Dx: R73.09 06/07/17   Mathis Som, MD  labetalol  (NORMODYNE ) 200 MG tablet TAKE 1 TABLET(200 MG) BY MOUTH TWICE DAILY 06/06/23   Genora Kidd, MD  Lancets [facility] ULTRASOFT) lancets Use as instructed 10/11/16   Mathis Som, MD  magnesium  chloride (SLOW-MAG) 64 MG TBEC SR tablet Take 1 tablet by mouth daily.    [provider]  MOUNJARO  12.5 MG/0.5ML Pen Inject 12.5 mg into the skin once a week. On Monday 05/28/23   [provider]  Multiple Vitamins-Minerals (ONE-A-DAY MENS 50+ ADVANTAGE) TABS Take 1 tablet by mouth daily.    [provider]  pantoprazole  (PROTONIX ) 40 MG tablet Take 1 tablet (40 mg total) by mouth 2 (two) times daily. 06/04/23 06/03/24  Albin Huh, MD      Allergies    Patient has no known allergies.    Review of Systems   Review of Systems  Constitutional:  Positive for fatigue. Negative for fever.       + generalized weakness  Eyes:  Negative for visual disturbance.  Respiratory:  Negative for shortness of breath.   Cardiovascular:  Negative for chest pain.  Gastrointestinal:  Negative for abdominal pain, diarrhea and vomiting.  Musculoskeletal:  Positive for neck  pain. Negative for back pain and neck stiffness.  Skin:  Negative for rash.  Neurological:  Negative for facial asymmetry, speech difficulty and headaches.    Physical Exam Updated Vital Signs BP (!) 154/68   Pulse 85   Temp 98.9 F (37.2 C) (Oral)   Resp 18   Ht 6\' 9"  (2.057 m)   Wt 130.6 kg   SpO2 100%   BMI 30.86 kg/m  Physical Exam Vitals and nursing note reviewed.  Constitutional:      Appearance: Normal appearance.  HENT:     Head: Normocephalic.     Mouth/Throat:     Mouth: Mucous membranes are moist.  Neck:     Comments: No  midline cervical spine tenderness.  Patient identifies as the area of tenderness in the neck being just above the clavicle, extending up the neck and sometimes posteriorly.  Not reproducible on exam, no noted lymphadenopathy or significant swelling.  No difficulty swallowing.  Cardiovascular:     Rate and Rhythm: Normal rate.  Pulmonary:     Effort: Pulmonary effort is normal. No respiratory distress.  Abdominal:     Palpations: Abdomen is soft.     Tenderness: There is no abdominal tenderness.  Musculoskeletal:        General: No swelling or deformity.     Cervical back: No rigidity.  Skin:    General: Skin is warm.  Neurological:     Mental Status: He is alert and oriented to person, place, and time. Mental status is at baseline.  Psychiatric:        Mood and Affect: Mood normal.     ED Results / Procedures / Treatments   Labs (all labs ordered are listed, but only abnormal results are displayed) Labs Reviewed  CBC WITH DIFFERENTIAL/PLATELET - Abnormal; Notable for the following components:      Result Value   WBC 14.9 (*)    RBC 3.34 (*)    Hemoglobin 9.6 (*)    HCT 29.3 (*)    RDW 15.9 (*)    Neutro Abs 11.9 (*)    Monocytes Absolute 1.7 (*)    Abs Immature Granulocytes 0.08 (*)    All other components within normal limits  BASIC METABOLIC PANEL WITH GFR - Abnormal; Notable for the following components:   Sodium 133 (*)    CO2 20 (*)    Glucose, Bld 188 (*)    Creatinine, Ser 1.48 (*)    GFR, Estimated 53 (*)    All other components within normal limits    EKG None  Radiology No results found.  Procedures Procedures    Medications Ordered in ED Medications - No data to display  ED Course/ Medical Decision Making/ A&P                                 Medical Decision Making Amount and/or Complexity of Data Reviewed Labs: ordered. Radiology: ordered.  Risk Prescription drug management.   63 year old male presents emergency department with  generalized fatigue.  Also reporting right-sided neck pain.  Denies any headache, dizziness, neurosymptoms.  Mental signs are normal and stable on arrival.  He does have a urostomy bag.  Blood work shows mild leukocytosis.  Kidney function and anemia is around baseline.  Urinalysis shows findings concerning for UTI.  Will send culture.  In regards to the neck pain, this is mainly right supraclavicular, somewhat reproducible on exam, no noted  lymphadenopathy.  The pain does sometimes radiate to the posterior neck but no headache or neurosymptoms.  For this reason a CT of the head and neck was done.  Imaging does show what looks like a diminutive vertebral artery on the right, occlusion was not ruled out.  I consulted with neurology, Dr. Arora.  Again the patient has no headache, cerebellar symptoms.  I do not believe that this is acute.  After review of imaging we feel that this is an anatomical variant.  Patient noted on these findings.  Used previous urine cultures to recommend antibiotic choice.  Will prescribe Bactrim and have him follow-up outpatient.  Patient at this time appears safe and stable for discharge and close outpatient follow up. Discharge plan and strict return to ED precautions discussed, patient verbalizes understanding and agreement.        Final Clinical Impression(s) / ED Diagnoses Final diagnoses:  None    Rx / DC Orders ED Discharge Orders     None         Flonnie Humphrey, DO 06/16/23 1541

## 2023-06-16 NOTE — Discharge Instructions (Signed)
 You have been seen and discharged from the emergency department.  I believe you are suffering from a urinary tract infection.  I went off of your previous urine cultures to prescribe an antibiotic.  Take Bactrim as prescribed.  There was an incidental finding on the CT imaging today.  I consulted with neurology and we do not feel that this is acute.  If you start experiencing any headache, dizziness, difficulty walking or other acute neurosymptoms please report to emergency department.  Follow-up with your primary provider for further evaluation and further care. Take home medications as prescribed. If you have any worsening symptoms or further concerns for your health please return to an emergency department for further evaluation.

## 2023-06-20 NOTE — Telephone Encounter (Signed)
 Paper Work Dropped Off: Coumadin Desk   Date:06/20/2023   Location of paper: IN MAIL BOX

## 2023-06-25 ENCOUNTER — Telehealth: Payer: Self-pay | Admitting: Student

## 2023-06-25 NOTE — Telephone Encounter (Signed)
 Patient dropped off DMV placard form to be completed. Last DOS was 06/10/23. Placed in [employer].

## 2023-06-25 NOTE — Telephone Encounter (Signed)
 Left a form in your box that the patient needs completed.

## 2023-06-26 ENCOUNTER — Other Ambulatory Visit (HOSPITAL_COMMUNITY): Payer: Self-pay

## 2023-06-26 ENCOUNTER — Encounter: Payer: Self-pay | Admitting: Gastroenterology

## 2023-07-01 ENCOUNTER — Ambulatory Visit (HOSPITAL_COMMUNITY)
Admission: RE | Admit: 2023-07-01 | Discharge: 2023-07-01 | Disposition: A | Discharge status: Home / Self Care | Source: Ambulatory Visit

## 2023-07-01 DIAGNOSIS — Z452 Encounter for adjustment and management of vascular access device: Secondary | ICD-10-CM | POA: Insufficient documentation

## 2023-07-01 DIAGNOSIS — Z95828 Presence of other vascular implants and grafts: Secondary | ICD-10-CM

## 2023-07-01 HISTORY — PX: IR REMOVAL TUN ACCESS W/ PORT W/O FL MOD SED: IMG2290

## 2023-07-01 MED ORDER — LIDOCAINE-EPINEPHRINE 1 %-1:100000 IJ SOLN
INTRAMUSCULAR | Status: AC
  Filled 2023-07-01: qty 1

## 2023-07-01 MED ORDER — LIDOCAINE-EPINEPHRINE 1 %-1:100000 IJ SOLN
20.0000 mL | Freq: Once | INTRAMUSCULAR | Status: AC
  Administered 2023-07-01: 10 mL via INTRADERMAL

## 2023-07-01 NOTE — Telephone Encounter (Signed)
 Patient called and informed that forms are ready for pick up. Copy made and placed in batch scanning. Original placed at front desk for pick up.   Veronda Prude, RN

## 2023-07-04 ENCOUNTER — Other Ambulatory Visit: Payer: Self-pay

## 2023-07-05 ENCOUNTER — Other Ambulatory Visit: Payer: Self-pay

## 2023-07-05 ENCOUNTER — Other Ambulatory Visit (HOSPITAL_COMMUNITY): Payer: Self-pay

## 2023-07-05 ENCOUNTER — Telehealth: Payer: Self-pay | Admitting: Student

## 2023-07-05 MED ORDER — APIXABAN 5 MG PO TABS: 5.0000 mg | ORAL_TABLET | Freq: Two times a day (BID) | ORAL | 0 refills | Status: DC

## 2023-07-05 NOTE — Telephone Encounter (Signed)
 Patient came in asking for a refill of his Eliquis , he is completely out.

## 2023-07-08 NOTE — Telephone Encounter (Signed)
 Left voice mail stating that his medication was sent to his pharmacy.

## 2023-07-09 ENCOUNTER — Encounter: Payer: Self-pay | Admitting: *Deleted

## 2023-07-29 ENCOUNTER — Inpatient Hospital Stay

## 2023-07-29 DIAGNOSIS — C678 Malignant neoplasm of overlapping sites of bladder: Secondary | ICD-10-CM | POA: Insufficient documentation

## 2023-07-29 DIAGNOSIS — Z79899 Other long term (current) drug therapy: Secondary | ICD-10-CM | POA: Diagnosis not present

## 2023-07-29 DIAGNOSIS — D649 Anemia, unspecified: Secondary | ICD-10-CM

## 2023-07-29 LAB — CBC WITH DIFFERENTIAL (CANCER CENTER ONLY)
Abs Immature Granulocytes: 0.01 10*3/uL (ref 0.00–0.07)
Basophils Absolute: 0 10*3/uL (ref 0.0–0.1)
Basophils Relative: 0 %
Eosinophils Absolute: 0.2 10*3/uL (ref 0.0–0.5)
Eosinophils Relative: 4 %
HCT: 33.2 % — ABNORMAL LOW (ref 39.0–52.0)
Hemoglobin: 10.9 g/dL — ABNORMAL LOW (ref 13.0–17.0)
Immature Granulocytes: 0 %
Lymphocytes Relative: 19 %
Lymphs Abs: 1 10*3/uL (ref 0.7–4.0)
MCH: 30.3 pg (ref 26.0–34.0)
MCHC: 32.8 g/dL (ref 30.0–36.0)
MCV: 92.2 fL (ref 80.0–100.0)
Monocytes Absolute: 0.5 10*3/uL (ref 0.1–1.0)
Monocytes Relative: 9 %
Neutro Abs: 3.5 10*3/uL (ref 1.7–7.7)
Neutrophils Relative %: 68 %
Platelet Count: 229 10*3/uL (ref 150–400)
RBC: 3.6 MIL/uL — ABNORMAL LOW (ref 4.22–5.81)
RDW: 14.8 % (ref 11.5–15.5)
WBC Count: 5.2 10*3/uL (ref 4.0–10.5)
nRBC: 0 % (ref 0.0–0.2)

## 2023-07-29 LAB — CMP (CANCER CENTER ONLY)
ALT: 20 U/L (ref 0–44)
AST: 19 U/L (ref 15–41)
Albumin: 4.1 g/dL (ref 3.5–5.0)
Alkaline Phosphatase: 54 U/L (ref 38–126)
Anion gap: 6 (ref 5–15)
BUN: 17 mg/dL (ref 8–23)
CO2: 24 mmol/L (ref 22–32)
Calcium: 9.5 mg/dL (ref 8.9–10.3)
Chloride: 111 mmol/L (ref 98–111)
Creatinine: 1.37 mg/dL — ABNORMAL HIGH (ref 0.61–1.24)
GFR, Estimated: 59 mL/min — ABNORMAL LOW (ref >60)
Glucose, Bld: 131 mg/dL — ABNORMAL HIGH (ref 70–99)
Potassium: 4.1 mmol/L (ref 3.5–5.1)
Sodium: 141 mmol/L (ref 135–145)
Total Bilirubin: 0.5 mg/dL (ref 0.0–1.2)
Total Protein: 7.3 g/dL (ref 6.5–8.1)

## 2023-07-29 LAB — IRON AND IRON BINDING CAPACITY (CC-WL,HP ONLY)
Iron: 55 ug/dL (ref 45–182)
Saturation Ratios: 21 % (ref 17.9–39.5)
TIBC: 263 ug/dL (ref 250–450)
UIBC: 208 ug/dL (ref 117–376)

## 2023-07-29 LAB — MAGNESIUM: Magnesium: 1.9 mg/dL (ref 1.7–2.4)

## 2023-07-29 LAB — FERRITIN: Ferritin: 100 ng/mL (ref 24–336)

## 2023-07-29 LAB — VITAMIN B12: Vitamin B-12: 809 pg/mL (ref 180–914)

## 2023-07-30 ENCOUNTER — Ambulatory Visit: Payer: Self-pay

## 2023-07-30 DIAGNOSIS — D649 Anemia, unspecified: Secondary | ICD-10-CM

## 2023-07-30 DIAGNOSIS — C679 Malignant neoplasm of bladder, unspecified: Secondary | ICD-10-CM

## 2023-07-30 NOTE — Telephone Encounter (Signed)
 Called and spoke to him on results. Overall feeling better.  Recommend: Cancel 7/17 appt with me. CT scan during first week of Aug.  Follow up with lab, and then see me on 8/14 at [facility] please make the above changes. Thanks.

## 2023-08-05 ENCOUNTER — Ambulatory Visit: Payer: Medicare Other

## 2023-08-05 VITALS — Ht >= 80 in | Wt 294.0 lb

## 2023-08-05 DIAGNOSIS — Z0000 Encounter for general adult medical examination without abnormal findings: Secondary | ICD-10-CM

## 2023-08-05 NOTE — Patient Instructions (Signed)
 Mr. Brad [name] , Thank you for taking time out of your busy schedule to complete your Annual Wellness Visit with me. I enjoyed our conversation and look forward to speaking with you again next year. I, as well as your care team,  appreciate your ongoing commitment to your health goals. Please review the following plan we discussed and let me know if I can assist you in the future. Your Game plan/ To Do List    Referrals: If you haven't heard from the office you've been referred to, please reach out to them at the phone provided.   Follow up Visits: Next Medicare AWV with our clinical staff: 08/06/2024 at 9:10 a.m. phoen visit with Nurse Health Advisor   Have you seen your provider in the last 6 months (3 months if uncontrolled diabetes)? Yes Next Office Visit with your provider: Not scheduled  Clinician Recommendations:  Aim for 30 minutes of exercise or brisk walking, 6-8 glasses of water , and 5 servings of fruits and vegetables each day.       This is a list of the screening recommended for you and due dates:  Health Maintenance  Topic Date Due   Pneumococcal Vaccination (1 of 2 - PCV) Never done   Zoster (Shingles) Vaccine (1 of 2) Never done   Colon Cancer Screening  Never done   Complete foot exam   11/25/2019   COVID-19 Vaccine (6 - Moderna risk 2024-25 season) 05/09/2023   Eye exam for diabetics  05/16/2023   Yearly kidney health urinalysis for diabetes  08/27/2023   Flu Shot  08/30/2023   Hemoglobin A1C  09/26/2023   Screening for Lung Cancer  06/02/2024   Yearly kidney function blood test for diabetes  07/28/2024   Medicare Annual Wellness Visit  08/04/2024   DTaP/Tdap/Td vaccine (2 - Td or Tdap) 02/28/2025   Hepatitis C Screening  Completed   HIV Screening  Completed   Hepatitis B Vaccine  Aged Out   HPV Vaccine  Aged Out   Meningitis B Vaccine  Aged Out    Advanced directives: (Declined) Advance directive discussed with you today. Even though you declined this today, please  call our office should you change your mind, and we can give you the proper paperwork for you to fill out. Advance Care Planning is important because it:  [x]  Makes sure you receive the medical care that is consistent with your values, goals, and preferences  [x]  It provides guidance to your family and loved ones and reduces their decisional burden about whether or not they are making the right decisions based on your wishes.  Follow the link provided in your after visit summary or read over the paperwork we have mailed to you to help you started getting your Advance Directives in place. If you need assistance in completing these, please reach out to us  so that we can help you!  See attachments for Preventive Care and Fall Prevention Tips.

## 2023-08-05 NOTE — Progress Notes (Signed)
 Because this visit was a virtual/telehealth visit,  certain criteria was not obtained, such a blood pressure, CBG if applicable, and timed get up and go. Any medications not marked as taking were not mentioned during the medication reconciliation part of the visit. Any vitals not documented were not able to be obtained due to this being a telehealth visit or patient was unable to self-report a recent blood pressure reading due to a lack of equipment at home via telehealth. Vitals that have been documented are verbally provided by the patient.   Subjective:   Brad [name] is a 63 y.o. who presents for a Medicare Wellness preventive visit.  As a reminder, Annual Wellness Visits don't include a physical exam, and some assessments may be limited, especially if this visit is performed virtually. We may recommend an in-person follow-up visit with your provider if needed.  Visit Complete: Virtual I connected with  Brad [name] on 08/05/23 by a audio enabled telemedicine application and verified that I am speaking with the correct person using two identifiers.  Patient Location: Home  Provider Location: Home Office  I discussed the limitations of evaluation and management by telemedicine. The patient expressed understanding and agreed to proceed.  Vital Signs: Because this visit was a virtual/telehealth visit, some criteria may be missing or patient reported. Any vitals not documented were not able to be obtained and vitals that have been documented are patient reported.  VideoDeclined- This patient declined Librarian, academic. Therefore the visit was completed with audio only.  Persons Participating in Visit: Patient.  AWV Questionnaire: Yes: Patient Medicare AWV questionnaire was completed by the patient on 08/02/2023; I have confirmed that all information answered by patient is correct and no changes since this date.  Cardiac Risk Factors include: advanced  age (>87men, >92 women);diabetes mellitus;sedentary lifestyle;obesity (BMI >30kg/m2);male gender;hypertension;family history of premature cardiovascular disease     Objective:    Today's Vitals   08/05/23 0913 08/05/23 0926  Weight: 294 lb (133.4 kg)   Height: 6' 9 (2.057 m)   PainSc: 0-No pain 0-No pain   Body mass index is 31.51 kg/m.     08/05/2023    9:15 AM 06/10/2023    2:50 PM 06/01/2023    2:05 AM 05/31/2023    8:00 PM 04/24/2023    3:22 PM 03/29/2023    6:54 AM 03/29/2023    3:00 AM  Advanced Directives  Does Patient Have a Medical Advance Directive? No No No No No No No  Would patient like information on creating a medical advance directive? No - Patient declined No - Guardian declined No - Guardian declined No - Patient declined No - Patient declined No - Patient declined No - Patient declined    Current Medications (verified) Outpatient Encounter Medications as of 08/05/2023  Medication Sig   acetaminophen  (TYLENOL ) 500 MG tablet Take 500 mg by mouth every 6 (six) hours as needed for moderate pain (pain score 4-6).   amLODipine  (NORVASC ) 10 MG tablet Take 1 tablet (10 mg total) by mouth daily.   apixaban  (ELIQUIS ) 5 MG TABS tablet Take 1 tablet (5 mg total) by mouth 2 (two) times daily.   blood glucose meter kit and supplies KIT Dispense based on patient and insurance preference. Check fasting blood sugar once daily ICD10 R73.03   Cholecalciferol (VITAMIN D3 MAXIMUM STRENGTH) 125 MCG (5000 UT) capsule Take 5,000 Units by mouth daily.   diclofenac  Sodium (VOLTAREN ) 1 % GEL Apply 4 g topically  4 (four) times daily as needed (pain).   gabapentin  (NEURONTIN ) 100 MG capsule Take 2 tablets at bedtime (200 mg)   glucose blood test strip Use as instructed to monitor FSBS 1x daily. Dx: R73.09   labetalol  (NORMODYNE ) 200 MG tablet TAKE 1 TABLET(200 MG) BY MOUTH TWICE DAILY   Lancets (ONETOUCH ULTRASOFT) lancets Use as instructed   magnesium  chloride (SLOW-MAG) 64 MG TBEC SR tablet  Take 1 tablet by mouth daily.   MOUNJARO  12.5 MG/0.5ML Pen Inject 12.5 mg into the skin once a week. On Monday   Multiple Vitamins-Minerals (ONE-A-DAY MENS 50+ ADVANTAGE) TABS Take 1 tablet by mouth daily.   pantoprazole  (PROTONIX ) 40 MG tablet Take 1 tablet (40 mg total) by mouth 2 (two) times daily.   No facility-administered encounter medications on file as of 08/05/2023.    Allergies (verified) Patient has no known allergies.   History: Past Medical History:  Diagnosis Date   Abnormal ultrasound of carotid artery 04/2014   no significant obstruction   Ataxia 04/2014   Cancer (HCC)    Diabetes (HCC)    EKG abnormalities 04/2014   poor R wave progression, NSR   Former smoker    25 years x 1.5 ppd, stopped 04/2014   Gait disturbance 04/2014   s/p stroke   H/O echocardiogram 04/2014   moderate LVH, 60-65% EF, no valve disease   History of heart attack    Hypertension    Hypertensive heart disease 04/2014   Intraparenchymal hemorrhage of brain [facility]) 05/05/2014   hospitalization [facility]   Myocardial infarction [facility])    Obesity    Prediabetes    Short-term memory loss 04/2014   Sleep apnea    Stroke (HCC) 04/2014   Past Surgical History:  Procedure Laterality Date   APPENDECTOMY     CHOLECYSTECTOMY     CIRCUMCISION     CYSTOSCOPY N/A 09/26/2022   Procedure: CYSTOSCOPY;  Surgeon: Devere Lonni Righter, MD;  Location: [facility];  Service: Urology;  Laterality: N/A;  60 MINUTES   HEMORRHOID SURGERY     IR IMAGING GUIDED PORT INSERTION  11/08/2022   IR REMOVAL TUN ACCESS W/ PORT W/O FL MOD SED  07/01/2023   ROBOT LAP RADICAL CYSTOPROSTATECTOMY PELVIC LYMPHADENECTOMY, NEOBLADDER N/A 03/29/2023   Procedure: CYSTOSCOPY WITH ICG/ROBOT ASSISTED LAPAROSCOPIC RADICAL CYSTOPROSTATECTOMY;  Surgeon: Alvaro Ricardo KATHEE Mickey., MD;  Location: [facility];  Service: Urology;  Laterality: N/A;   Family History  Problem Relation Age of Onset   Heart disease Mother    Hypertension  Mother    Sudden death Mother    Hypertension Brother    Aneurysm Paternal Grandmother    Social History   Socioeconomic History   Marital status: Married    Spouse name: Proofreader   Number of children: 2   Years of education: 12   Highest education level: 12th grade  Occupational History   Occupation: Retired   Tobacco Use   Smoking status: Former    Current packs/day: 0.00    Average packs/day: 1.5 packs/day for 25.0 years (37.5 ttl pk-yrs)    Types: Cigarettes    Start date: 05/04/1989    Quit date: 05/05/2014    Years since quitting: 9.2    Passive exposure: Past   Smokeless tobacco: Never  Vaping Use   Vaping status: Never Used  Substance and Sexual Activity   Alcohol use: No    Alcohol/week: 0.0 standard drinks of alcohol   Drug use: No   Sexual activity:  Not on file  Other Topics Concern   Not on file  Social History Narrative   Lives at home with wife   Married to [name], non denominational, works at [employer] in [place], [place].  From [place], but moved to [place] 2016.     Caffeine use: Drinks tea occass   Social Drivers of Health   Financial Resource Strain: Low Risk  (08/05/2023)   Overall Financial Resource Strain (CARDIA)    Difficulty of Paying Living Expenses: Not hard at all  Food Insecurity: No Food Insecurity (08/05/2023)   Hunger Vital Sign    Worried About Running Out of Food in the Last Year: Never true    Ran Out of Food in the Last Year: Never true  Transportation Needs: No Transportation Needs (08/05/2023)   PRAPARE - Administrator, Civil Service (Medical): No    Lack of Transportation (Non-Medical): No  Physical Activity: Insufficiently Active (08/05/2023)   Exercise Vital Sign    Days of Exercise per Week: 3 days    Minutes of Exercise per Session: 30 min  Stress: No Stress Concern Present (08/05/2023)   [employer] of Occupational Health - Occupational Stress Questionnaire    Feeling of Stress: Only a little   Social Connections: Unknown (08/05/2023)   Social Connection and Isolation Panel    Frequency of Communication with Friends and Family: Once a week    Frequency of Social Gatherings with Friends and Family: Once a week    Attends Religious Services: More than 4 times per year    Active Member of [employer] or Organizations: Patient declined    Attends Engineer, structural: Patient declined    Marital Status: Married    Tobacco Counseling Counseling given: Not Answered    Clinical Intake:  Pre-visit preparation completed: Yes  Pain : No/denies pain Pain Score: 0-No pain     BMI - recorded: 31.51 Nutritional Status: BMI > 30  Obese Nutritional Risks: None Diabetes: Yes CBG done?: No Did pt. bring in CBG monitor from home?: No  Lab Results  Component Value Date   HGBA1C 6.6 (H) 03/29/2023   HGBA1C 6.0 08/27/2022   HGBA1C 5.8 09/06/2021     How often do you need to have someone help you when you read instructions, pamphlets, or other written materials from your doctor or pharmacy?: 1 - Never  Interpreter Needed?: No  Information entered by :: Alisse Tuite N. Tilley Faeth, LPN.   Activities of Daily Living     08/05/2023    9:17 AM 08/02/2023    9:31 AM  In your present state of health, do you have any difficulty performing the following activities:  Hearing? 0 0  Vision? 0 0  Difficulty concentrating or making decisions? 0 0  Walking or climbing stairs? 0 0  Dressing or bathing? 0 0  Doing errands, shopping? 0 0  Preparing Food and eating ? N N  Using the Toilet? N N  In the past six months, have you accidently leaked urine? N N  Do you have problems with loss of bowel control? N N  Managing your Medications? N N  Managing your Finances? N N  Housekeeping or managing your Housekeeping? N N    [name], [name], [name], [facility] [name] Pauletta BROCKS, [name]  I have updated your Care Teams any recent Medical  Services you may have received from other providers in the past year.     Assessment:   This is a routine wellness examination for Jacarri.  Hearing/Vision screen Hearing Screening - Comments:: Denies hearing difficulties.  Vision Screening - Comments:: Wears rx glasses - up to date with routine eye exams with [facility]    Goals Addressed             This Visit's Progress    Patient stated: I have had bladder and prostate cancer, I am still recovering and I just want to continue to [facility] and improve.         Depression Screen     08/05/2023    9:16 AM 06/10/2023    2:50 PM 09/18/2022    1:55 PM 09/11/2022   10:03 AM 08/27/2022   11:11 AM 07/30/2022    4:26 PM 09/06/2021    8:50 AM  PHQ 2/9 Scores  PHQ - 2 Score 0 0 0 0 0 0 0  PHQ- 9 Score 0 0 0 0 0  0    Fall Risk     08/05/2023    9:33 AM 08/02/2023    9:31 AM 08/27/2022   11:11 AM 07/30/2022    5:17 PM 11/25/2018   11:22 AM  Fall Risk   Falls in the past year? 1 1 0 0 0   Number falls in past yr: 1 1 0 0   Injury with Fall? 1 1 0 0   Risk for fall due to : History of fall(s);Impaired balance/gait   No Fall Risks   Follow up Falls evaluation completed;Education provided   Falls prevention discussed;Education provided;Falls evaluation completed Falls evaluation completed      Data saved with a previous flowsheet row definition    MEDICARE RISK AT HOME:  Medicare Risk at Home Any stairs in or around the home?: Yes If so, are there any without handrails?: No Home free of loose throw rugs in walkways, pet beds, electrical cords, etc?: No Adequate lighting in your home to reduce risk of falls?: Yes Life alert?: No Use of a cane, walker or w/c?: No Grab bars in the bathroom?: No Shower chair or bench in shower?: No Elevated toilet seat or a handicapped toilet?: No  TIMED UP AND GO:  Was the test performed?   No  Cognitive Function: 6CIT completed    08/05/2023    9:17 AM  MMSE - Mini Mental State Exam  Not completed: Unable to complete        08/05/2023    9:16 AM 07/30/2022    5:18 PM  6CIT Screen  What Year? 0 points 0 points  What month? 0 points 0 points  What time? 0 points 0 points  Count back from 20 0 points 0 points  Months in reverse 0 points 0 points  Repeat phrase 0 points 0 points  Total Score 0 points 0 points    Immunizations Immunization History  Administered Date(s) Administered   Influenza,inj,Quad PF,6+ Mos 09/14/2018, 11/02/2020   Influenza-Unspecified 11/08/2022   Moderna SARS-COV2 Booster Vaccination 04/04/2019, 10/23/2019, 04/29/2020   Moderna Sars-Covid-2 Vaccination 05/02/2019, 10/07/2020, 10/21/2021   Pfizer(Comirnaty)Fall Seasonal Vaccine 12 years and older 05/12/2022, 11/08/2022   Tdap 03/01/2015    Screening Tests Health Maintenance  Topic Date Due   Pneumococcal Vaccine 73-27 Years old (1 of 2 - PCV) Never done   Zoster Vaccines- Shingrix (1 of 2) Never done   Colonoscopy  Never done  FOOT EXAM  11/25/2019   COVID-19 Vaccine (6 - Moderna risk 2024-25 season) 05/09/2023   OPHTHALMOLOGY EXAM  05/16/2023   Diabetic kidney evaluation - Urine ACR  08/27/2023   INFLUENZA VACCINE  08/30/2023   HEMOGLOBIN A1C  09/26/2023   Lung Cancer Screening  06/02/2024   Diabetic kidney evaluation - eGFR measurement  07/28/2024   Medicare Annual Wellness (AWV)  08/04/2024   DTaP/Tdap/Td (2 - Td or Tdap) 02/28/2025   Hepatitis C Screening  Completed   HIV Screening  Completed   Hepatitis B Vaccines  Aged Out   HPV VACCINES  Aged Out   Meningococcal B Vaccine  Aged Out    Health Maintenance  Health Maintenance Due  Topic Date Due   Pneumococcal Vaccine 30-74 Years old (1 of 2 - PCV) Never done   Zoster Vaccines- Shingrix (1 of 2) Never done   Colonoscopy  Never done   FOOT EXAM  11/25/2019   COVID-19 Vaccine (6 - Moderna risk 2024-25 season) 05/09/2023    OPHTHALMOLOGY EXAM  05/16/2023   Diabetic kidney evaluation - Urine ACR  08/27/2023   Health Maintenance Items Addressed: Yes Patient aware of current care gaps.  Immunization record was verified by [employer].   Additional Screening:  Vision Screening: Recommended annual ophthalmology exams for early detection of glaucoma and other disorders of the eye. Would you like a referral to an eye doctor? No  Patient will call to schedule with [facility].  Dental Screening: Recommended annual dental exams for proper oral hygiene  Community Resource Referral / Chronic Care Management: CRR required this visit?  No   CCM required this visit?  No   Plan:    I have personally reviewed and noted the following in the patient's chart:   Medical and social history Use of alcohol, tobacco or illicit drugs  Current medications and supplements including opioid prescriptions. Patient is not currently taking opioid prescriptions. Functional ability and status Nutritional status Physical activity Advanced directives List of other physicians Hospitalizations, surgeries, and ER visits in previous 12 months Vitals Screenings to include cognitive, depression, and falls Referrals and appointments  In addition, I have reviewed and discussed with patient certain preventive protocols, quality metrics, and best practice recommendations. A written personalized care plan for preventive services as well as general preventive health recommendations were provided to patient.   Roz LOISE Fuller, LPN   03/02/7972   After Visit Summary: (MyChart) Due to this being a telephonic visit, the after visit summary with patients personalized plan was offered to patient via MyChart   Notes: Patient aware of current care gaps.  Immunization record was verified by [employer].

## 2023-08-12 ENCOUNTER — Other Ambulatory Visit: Payer: Self-pay

## 2023-08-12 MED ORDER — APIXABAN 5 MG PO TABS: 5.0000 mg | ORAL_TABLET | Freq: Two times a day (BID) | ORAL | 0 refills | Status: DC

## 2023-08-13 ENCOUNTER — Emergency Department (HOSPITAL_COMMUNITY)
Admission: EM | Admit: 2023-08-13 | Discharge: 2023-08-13 | Disposition: A | Discharge status: Home / Self Care | Attending: Student | Admitting: Student

## 2023-08-13 ENCOUNTER — Emergency Department (HOSPITAL_COMMUNITY)

## 2023-08-13 ENCOUNTER — Other Ambulatory Visit: Payer: Self-pay

## 2023-08-13 DIAGNOSIS — R109 Unspecified abdominal pain: Secondary | ICD-10-CM

## 2023-08-13 DIAGNOSIS — Z7901 Long term (current) use of anticoagulants: Secondary | ICD-10-CM | POA: Insufficient documentation

## 2023-08-13 DIAGNOSIS — Z8551 Personal history of malignant neoplasm of bladder: Secondary | ICD-10-CM | POA: Diagnosis not present

## 2023-08-13 DIAGNOSIS — Z8546 Personal history of malignant neoplasm of prostate: Secondary | ICD-10-CM | POA: Insufficient documentation

## 2023-08-13 DIAGNOSIS — N390 Urinary tract infection, site not specified: Secondary | ICD-10-CM | POA: Insufficient documentation

## 2023-08-13 LAB — URINALYSIS, W/ REFLEX TO CULTURE (INFECTION SUSPECTED)
Bilirubin Urine: NEGATIVE
Glucose, UA: NEGATIVE mg/dL
Ketones, ur: NEGATIVE mg/dL
Nitrite: POSITIVE — AB
Protein, ur: NEGATIVE mg/dL
Specific Gravity, Urine: 1.014 (ref 1.005–1.030)
pH: 6 (ref 5.0–8.0)

## 2023-08-13 LAB — BASIC METABOLIC PANEL WITH GFR
Anion gap: 11 (ref 5–15)
BUN: 15 mg/dL (ref 8–23)
CO2: 22 mmol/L (ref 22–32)
Calcium: 9.4 mg/dL (ref 8.9–10.3)
Chloride: 106 mmol/L (ref 98–111)
Creatinine, Ser: 1.15 mg/dL (ref 0.61–1.24)
GFR, Estimated: >60 mL/min (ref >60)
Glucose, Bld: 118 mg/dL — ABNORMAL HIGH (ref 70–99)
Potassium: 4 mmol/L (ref 3.5–5.1)
Sodium: 139 mmol/L (ref 135–145)

## 2023-08-13 LAB — CBC
HCT: 35.9 % — ABNORMAL LOW (ref 39.0–52.0)
Hemoglobin: 11.6 g/dL — ABNORMAL LOW (ref 13.0–17.0)
MCH: 30.1 pg (ref 26.0–34.0)
MCHC: 32.3 g/dL (ref 30.0–36.0)
MCV: 93 fL (ref 80.0–100.0)
Platelets: 201 K/uL (ref 150–400)
RBC: 3.86 MIL/uL — ABNORMAL LOW (ref 4.22–5.81)
RDW: 13.2 % (ref 11.5–15.5)
WBC: 5.8 K/uL (ref 4.0–10.5)
nRBC: 0 % (ref 0.0–0.2)

## 2023-08-13 MED ORDER — CEFUROXIME AXETIL 500 MG PO TABS: 500.0000 mg | ORAL_TABLET | Freq: Two times a day (BID) | ORAL | 0 refills | Status: AC

## 2023-08-13 MED ORDER — IOHEXOL 300 MG/ML  SOLN
100.0000 mL | Freq: Once | INTRAMUSCULAR | Status: AC | PRN
  Administered 2023-08-13: 100 mL via INTRAVENOUS

## 2023-08-13 NOTE — Discharge Instructions (Signed)
 Start Ceftin , 1 tablet by mouth twice daily for 7 days.  You can continue Tylenol /ibuprofen as needed for flank pain.  Follow-up with your primary care provider in approximately 1 week.  Keep your scheduled appointment with oncology/urology.  Return to the emergency department if your symptoms worsen.

## 2023-08-13 NOTE — ED Provider Notes (Signed)
 [name] EMERGENCY DEPARTMENT AT [facility] Provider Note   CSN: [id number] Arrival date & time: 08/13/23  1122     Patient presents with: Flank Pain   Brad [name] is a 63 y.o. male.   63 year old male presenting with flank pain.  Patient has noticed a dull intermittent right sided flank pain for approximately 4 days, pain is not currently present.  He has been taking Tylenol  and BC powder intermittently, which does alleviate his discomfort.  He reports that the pain has been severe at times, even waking him up from sleep.  Patient with history of cystoprostatectomy earlier this year due to bladder/prostate cancer, he wears a urostomy bag.  He has not noticed a change in the color/odor of his urine, changes his urostomy bag every 3 to 4 days.  No fever, nausea, vomiting, back pain.   Flank Pain       Prior to Admission medications   Medication Sig Start Date End Date Taking? Authorizing Provider  acetaminophen  (TYLENOL ) 500 MG tablet Take 500 mg by mouth every 6 (six) hours as needed for moderate pain (pain score 4-6).    [provider]  amLODipine  (NORVASC ) 10 MG tablet Take 1 tablet (10 mg total) by mouth daily. 12/15/20   Prentiss Frieze, DO  apixaban  (ELIQUIS ) 5 MG TABS tablet Take 1 tablet (5 mg total) by mouth 2 (two) times daily. 08/12/23   Gomes, Adriana, DO  blood glucose meter kit and supplies KIT Dispense based on patient and insurance preference. Check fasting blood sugar once daily ICD10 R73.03 10/11/16   Bari Theodoro FALCON, MD  Cholecalciferol (VITAMIN D3 MAXIMUM STRENGTH) 125 MCG (5000 UT) capsule Take 5,000 Units by mouth daily.    [provider]  diclofenac  Sodium (VOLTAREN ) 1 % GEL Apply 4 g topically 4 (four) times daily as needed (pain). 06/04/23   Elicia Hamlet, MD  gabapentin  (NEURONTIN ) 100 MG capsule Take 2 tablets at bedtime (200 mg) 07/05/23   [name] Pauletta BROCKS, MD  glucose blood test strip Use as instructed to monitor FSBS 1x  daily. Dx: R73.09 06/07/17   Bari Theodoro FALCON, MD  labetalol  (NORMODYNE ) 200 MG tablet TAKE 1 TABLET(200 MG) BY MOUTH TWICE DAILY 06/06/23   Christia Budds, MD  Lancets [facility] ULTRASOFT) lancets Use as instructed 10/11/16   Bari Theodoro FALCON, MD  magnesium  chloride (SLOW-MAG) 64 MG TBEC SR tablet Take 1 tablet by mouth daily.    [provider]  MOUNJARO  12.5 MG/0.5ML Pen Inject 12.5 mg into the skin once a week. On Monday 05/28/23   [provider]  Multiple Vitamins-Minerals (ONE-A-DAY MENS 50+ ADVANTAGE) TABS Take 1 tablet by mouth daily.    [provider]  pantoprazole  (PROTONIX ) 40 MG tablet Take 1 tablet (40 mg total) by mouth 2 (two) times daily. 06/04/23 06/03/24  Elicia Hamlet, MD    Allergies: Patient has no known allergies.    Review of Systems  Genitourinary:  Positive for flank pain.    Updated Vital Signs  Vitals:   08/13/23 1136 08/13/23 1301  BP: (!) 163/100 (!) 178/100  Pulse: 73 64  Resp: 18 20  Temp: 98.3 F (36.8 C) 98.3 F (36.8 C)  TempSrc: Oral Oral  SpO2: 100% 100%     Physical Exam Vitals and nursing note reviewed.  HENT:     Head: Normocephalic.  Eyes:     Extraocular Movements: Extraocular movements intact.  Cardiovascular:     Rate and Rhythm: Normal rate.  Pulmonary:  Effort: Pulmonary effort is normal.  Abdominal:     Palpations: Abdomen is soft.     Tenderness: There is no abdominal tenderness. There is no right CVA tenderness, left CVA tenderness or guarding.     Comments: RLQ stoma without surrounding erythema or evidence of infection   Musculoskeletal:     Comments: Moves all extremities spontaneously without difficulty  Skin:    General: Skin is warm and dry.  Neurological:     Mental Status: He is alert and oriented to person, place, and time.     (all labs ordered are listed, but only abnormal results are displayed) Labs Reviewed  BASIC METABOLIC PANEL WITH GFR - Abnormal; Notable for the following  components:      Result Value   Glucose, Bld 118 (*)    All other components within normal limits  CBC - Abnormal; Notable for the following components:   RBC 3.86 (*)    Hemoglobin 11.6 (*)    HCT 35.9 (*)    All other components within normal limits  URINALYSIS, W/ REFLEX TO CULTURE (INFECTION SUSPECTED) - Abnormal; Notable for the following components:   APPearance HAZY (*)    Hgb urine dipstick SMALL (*)    Nitrite POSITIVE (*)    Leukocytes,Ua LARGE (*)    Bacteria, UA MANY (*)    All other components within normal limits  URINE CULTURE    EKG: None  Radiology: CT ABDOMEN PELVIS W CONTRAST Result Date: 08/13/2023 CLINICAL DATA:  flank pain, history of cystoprostatectomy, + UTI EXAM: CT ABDOMEN AND PELVIS WITH CONTRAST TECHNIQUE: Multidetector CT imaging of the abdomen and pelvis was performed using the standard protocol following bolus administration of intravenous contrast. RADIATION DOSE REDUCTION: This exam was performed according to the departmental dose-optimization program which includes automated exposure control, adjustment of the mA and/or kV according to patient size and/or use of iterative reconstruction technique. CONTRAST:  OMNIPAQUE  IOHEXOL  300 MG/ML  SOLN COMPARISON:  April 24, 2023 FINDINGS: Lower chest: No focal airspace consolidation or pleural effusion. Hepatobiliary: No mass.Couple of small cysts in the left hepatic lobe are unchanged.Decompressed gallbladder without radiopaque stones or wall thickening. No intrahepatic or extrahepatic biliary ductal dilation. The portal veins are patent. Pancreas: No mass or main ductal dilation. No peripancreatic inflammation or fluid collection. Spleen: Normal size. No mass. Adrenals/Urinary Tract: No adrenal masses. Multiple bilateral renal cysts, unchanged. Interval removal of the bilateral nephroureteral stents. No hydronephrosis. Cystoprostatectomy. Right mid abdominal urostomy in place, unchanged. Stomach/Bowel: The  stomach contains ingested material without focal abnormality. No small bowel wall thickening or inflammation. No small bowel obstruction.Right lower quadrant surgical anastomosis related to urostomy creation.The appendix was not visualized. No right lower quadrant or pericecal inflammatory changes to suggest acute appendicitis. Scattered sigmoid colonic diverticulosis. No changes of acute diverticulitis. Vascular/Lymphatic: No aortic aneurysm. Scattered aortoiliac atherosclerosis. No intraabdominal or pelvic lymphadenopathy. Reproductive: No free pelvic fluid. Other: No pneumoperitoneum, ascites, or mesenteric inflammation. Musculoskeletal: No acute fracture or destructive lesion. Interval resolution of the small amount of fluid in the fat surrounding the urostomy. Multilevel degenerative disc disease of the spine. IMPRESSION: No acute intra-abdominal or pelvic abnormality. Aortic Atherosclerosis (ICD10-I70.0). Electronically Signed   By: Rogelia Myers M.D.   On: 08/13/2023 15:27     Procedures   Medications Ordered in the ED - No data to display  Medical Decision Making This patient presents to the ED for concern of flank pain, this involves an extensive number of treatment options, and is a complaint that carries with it a high risk of complications and morbidity.  The differential diagnosis includes UTI, pyelonephritis, appendicitis, musculoskeletal pain/strain/sprain, malignancy.   Co morbidities that complicate the patient evaluation  Former tobacco use   Additional history obtained:  Additional history obtained from record review External records from outside source obtained and reviewed including prior oncology notes and most recent ED note   Lab Tests:  I Ordered, and personally interpreted labs.  The pertinent results include: CBC notable for anemia with hemoglobin of 11.6, however this is improved from most recent lab results.  BMP unremarkable.   Urinalysis notable for small RBCs, leukocytes, nitrites, this is consistent with a urinary tract infection.   Imaging Studies ordered:  I ordered imaging studies including CT abdomen/pelvis I independently visualized and interpreted imaging which showed No acute intra-abdominal or pelvic abnormality. Aortic Atherosclerosis (ICD10-I70.0).  I agree with the radiologist interpretation   Cardiac Monitoring: / EKG:  The patient was maintained on a cardiac monitor.  I personally viewed and interpreted the cardiac monitored which showed an underlying rhythm of: NSR   Problem List / ED Course / Critical interventions / Medication management I have reviewed the patients home medicines and have made adjustments as needed   Social Determinants of Health:  Former tobacco use   Test / Admission - Considered:  Physical exam is largely unremarkable, patient does have stoma with urostomy bag in place, he did remove the bag for me to view his stoma and it does not demonstrate any signs of surrounding infection or purulent drainage.  Abdomen is soft and nontender to palpation, no CVA tenderness, labs are generally reassuring with no leukocytosis, no fever/tachycardia, I have a low suspicion for pyelonephritis given these findings as well as reassuring imaging as above.  Urinalysis is notable for UTI, will start Ceftin  twice daily for 7 days.  Encouraged the patient to follow-up with his primary care provider within a week.  He can continue Tylenol /ibuprofen as needed for flank pain.  He voiced understanding and is in agreement this plan, he mentions that he does have a follow-up with oncology/urology within the next month.  Strict return precautions discussed, he is in agreement with this plan and is appropriate for discharge at this time.    Amount and/or Complexity of Data Reviewed Labs: ordered. Radiology: ordered.  Risk Prescription drug management.        Final diagnoses:  Urinary  tract infection with hematuria, site unspecified  Flank pain    ED Discharge Orders          Ordered    cefUROXime  (CEFTIN ) 500 MG tablet  2 times daily with meals        08/13/23 1554               Glendia Rocky SAILOR, PA-C 08/13/23 1602    Albertina Dixon, MD 08/13/23 1610

## 2023-08-13 NOTE — ED Triage Notes (Signed)
 Patient to ED by POV with c/o right flank pain. HX of cancer states bladder and prostate removed 03/2023. Denies N/V.

## 2023-08-15 ENCOUNTER — Ambulatory Visit: Admitting: Gastroenterology

## 2023-08-15 ENCOUNTER — Inpatient Hospital Stay

## 2023-08-15 ENCOUNTER — Telehealth: Payer: Self-pay

## 2023-08-15 ENCOUNTER — Encounter: Payer: Self-pay | Admitting: Gastroenterology

## 2023-08-15 VITALS — BP 142/80 | HR 90 | Ht >= 80 in | Wt 289.1 lb

## 2023-08-15 VITALS — BP 141/67 | HR 77 | Temp 98.1°F | Resp 20 | Wt 291.4 lb

## 2023-08-15 DIAGNOSIS — Z79899 Other long term (current) drug therapy: Secondary | ICD-10-CM | POA: Insufficient documentation

## 2023-08-15 DIAGNOSIS — R310 Gross hematuria: Secondary | ICD-10-CM | POA: Diagnosis not present

## 2023-08-15 DIAGNOSIS — Z8546 Personal history of malignant neoplasm of prostate: Secondary | ICD-10-CM

## 2023-08-15 DIAGNOSIS — C679 Malignant neoplasm of bladder, unspecified: Secondary | ICD-10-CM | POA: Diagnosis not present

## 2023-08-15 DIAGNOSIS — D649 Anemia, unspecified: Secondary | ICD-10-CM | POA: Insufficient documentation

## 2023-08-15 DIAGNOSIS — C678 Malignant neoplasm of overlapping sites of bladder: Secondary | ICD-10-CM | POA: Diagnosis present

## 2023-08-15 DIAGNOSIS — D509 Iron deficiency anemia, unspecified: Secondary | ICD-10-CM | POA: Diagnosis not present

## 2023-08-15 DIAGNOSIS — G629 Polyneuropathy, unspecified: Secondary | ICD-10-CM | POA: Insufficient documentation

## 2023-08-15 DIAGNOSIS — Z9889 Other specified postprocedural states: Secondary | ICD-10-CM | POA: Diagnosis not present

## 2023-08-15 DIAGNOSIS — N400 Enlarged prostate without lower urinary tract symptoms: Secondary | ICD-10-CM | POA: Diagnosis not present

## 2023-08-15 DIAGNOSIS — Z9079 Acquired absence of other genital organ(s): Secondary | ICD-10-CM | POA: Insufficient documentation

## 2023-08-15 DIAGNOSIS — I480 Paroxysmal atrial fibrillation: Secondary | ICD-10-CM

## 2023-08-15 DIAGNOSIS — Z87891 Personal history of nicotine dependence: Secondary | ICD-10-CM

## 2023-08-15 DIAGNOSIS — Z8551 Personal history of malignant neoplasm of bladder: Secondary | ICD-10-CM | POA: Diagnosis not present

## 2023-08-15 MED ORDER — GABAPENTIN 100 MG PO CAPS: ORAL_CAPSULE | ORAL | 3 refills | Status: AC

## 2023-08-15 NOTE — Assessment & Plan Note (Addendum)
Improved. Repeat in 3 months.

## 2023-08-15 NOTE — Telephone Encounter (Signed)
 Patient with diagnosis of A Fib on Eliquis  for anticoagulation.    Procedure: Endoscopy Procedure  Date of procedure: TBD   CHA2DS2-VASc Score = 5  This indicates a 7.2% annual risk of stroke. The patient's score is based upon: CHF History: 0 HTN History: 1 Diabetes History: 1 Stroke History: 2 Vascular Disease History: 1 Age Score: 0 Gender Score: 0     CrCl 126 ml/min Platelet count 201K   Per office protocol, patient can hold Eliquis  for 2 days prior to procedure.    **This guidance is not considered finalized until pre-operative APP has relayed final recommendations.**

## 2023-08-15 NOTE — Progress Notes (Signed)
 [facility] OFFICE PROGRESS NOTE  [name], [name], [name], [facility] [name] Pauletta BROCKS, [name]  Brad [name] is a 63 y.o.male with history of hypertension, CVA, type 2 diabetes, OSA being follow [facility] at [facility] for muscle invasive bladder cancer.    Diagnosis: MIBC Treatment: neoadjuvant ddMVAC x4 10-12/2022 03/29/23 Cystoprostatectomy and bilateral pelvic lymph node dissection, ileal conduit urinary diversion. 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  CT was done at that time including abdomen and pelvis without any signs of disease recurrence. Assessment & Plan Malignant neoplasm of urinary bladder, unspecified site [facility]) S/p Neoadjuvant ddMVAC. Tolerated well S/p cystoprostatectomy. ypT0. pCR CT CAP in 3 months about mid Oct CBC, CMP in Oct before visit History of ileal conduit B12 1000 mcg daily Repeat with next visit in about 3 months Anemia, unspecified type Improved.  Repeat in 3 months. Neuropathy   Follow up in about 3 months, after CT scan and lab before visit.   Pauletta BROCKS [name], MD  INTERVAL HISTORY: Patient returns for follow-up.   Saw GI this morning. Wondering if colonoscopy should be done yet with improvement of hgb. He is due to have colonoscopy. Color of stools are normal. No bloody urine in urostomy.   No palpitation. Taking apixaban  without bleeding.  He was in ED for UTI with right flank pain. It resolved after antibiotics.  Oncology History  Bladder cancer (HCC)  08/28/2022 Imaging   1. Contrast enhancing, lobulated endoluminal mass of the anterior bladder dome, measuring 2.6 x 2.0 x 1.2 cm, consistent with primary bladder malignancy. 2. Prominent varices  about the bladder dome and anterior bladder, without overt evidence of extramural tumor extent. 3. No evidence of lymphadenopathy or metastatic disease in the abdomen or pelvis. 4. Prostatomegaly.   09/11/2022 Initial Diagnosis   Bladder cancer [facility]) At age 75 year old found to have a solid enhancing 2.5 cm anterior bladder dome mass on CT hematuria protocol on 08/28/2022 during evaluation for gross hematuria.    09/26/2022 Pathology Results   A. BLADDER TUMOR, SUPERFICIAL MARGIN DOME, TURBT:  High grade papillary urothelial carcinoma with inverted growth pattern  The carcinoma invades muscularis propria   B. BLADDER TUMOR, DEEP MARGIN DOME, TURBT:  Invasive high grade papillary urothelial carcinoma  Muscularis propria (detrusor muscle) is present and not involved by  carcinoma   C. BLADDER TUMOR, INFERIOR MARGIN, BIOPSY:  Submucosa and muscularis propria is present and not involved by  carcinoma    09/26/2022 Procedure   1.  Cystoscopy with TURBT (medium) 2.  Intravesical instillation of gemcitabine  3.  Cystogram with intraoperative interpretation of fluoroscopic imaging   11/22/2022 - 01/07/2023 Chemotherapy   Patient is on Treatment Plan : BLADDER DOSE DENSE MVAC q14d     01/29/2023 Imaging   CT CAP: IMPRESSION: 1. Interval resection of the previously visualized bladder mass without evidence of local recurrence given limited distension. 2. No evidence of metastatic disease within the chest, abdomen or pelvis. 3.  Aortic Atherosclerosis (ICD10-I70.0).   03/07/2023 Cancer Staging   Staging form: Urinary Bladder, AJCC 8th Edition - Clinical: Stage II (cT2, cN0, cM0) - Signed by [name] Pauletta BROCKS, MD on 03/07/2023 Stage prefix: Initial diagnosis WHO/ISUP grade (low/high): High Grade Histologic grading system: 2 grade system  03/29/2023 Pathology Results   CYSTOPROSTATECTOMY and bilateral pelvic lymph node dissection, ileal conduit urinary diversion   No residual carcinoma  identified (ypT0)  All margins of resection are negative for carcinoma   Small focus of prostatic adenocarcinoma, Gleason score 3+3=6 (grade group 1)  Tumor is organ confined (ypT2 ypN0)  Seminal vesicles, vasa deferentia and all margins of resection are  negative for tumor         PHYSICAL EXAMINATION: ECOG PERFORMANCE STATUS: 0 - Asymptomatic  Vitals:   08/15/23 1200  BP: (!) 141/67  Pulse: 77  Resp: 20  Temp: 98.1 F (36.7 C)  SpO2: 97%   Filed Weights   08/15/23 1200  Weight: 291 lb 6.4 oz (132.2 kg)   No distress Lung sounds clear. Heart RRR Abd non-tender and non-distended. Urostomy in place   Relevant data reviewed during this visit included labs and recent visit.

## 2023-08-15 NOTE — Telephone Encounter (Signed)
 Request for surgical clearance:     Endoscopy Procedure  What type of surgery is being performed?     Endoscopic  When is this surgery scheduled?     TBD  What type of clearance is required ?   Pharmacy  Are there any medications that need to be held prior to surgery and how long? Eliquis  2 days  Practice name and name of physician performing surgery?      [facility]  What is your office phone and fax number?      Phone- [phone]  Fax- [phone]  Anesthesia type (None, local, MAC, general) ?       MAC  Please route your response to [facility]

## 2023-08-15 NOTE — Assessment & Plan Note (Addendum)
 S/p Neoadjuvant ddMVAC. Tolerated well S/p cystoprostatectomy. ypT0. pCR CT CAP in 3 months about mid Oct CBC, CMP in Oct before visit

## 2023-08-15 NOTE — Patient Instructions (Addendum)
 Continue oral iron as prescribed Report any signs of active bleeding  _______________________________________________________  If your blood pressure at your visit was 140/90 or greater, please contact your primary care physician to follow up on this.  _______________________________________________________  If you are age 61 or older, your body mass index should be between 23-30. Your Body mass index is 31.76 kg/m. If this is out of the aforementioned range listed, please consider follow up with your Primary Care Provider.  If you are age 55 or younger, your body mass index should be between 19-25. Your Body mass index is 31.76 kg/m. If this is out of the aformentioned range listed, please consider follow up with your Primary Care Provider.   ________________________________________________________  The [place] [facility] providers would like to encourage you to use MYCHART to communicate with providers for non-urgent requests or questions.  Due to long hold times on the telephone, sending your provider a message by [facility] may be a faster and more efficient way to get a response.  Please allow 48 business hours for a response.  Please remember that this is for non-urgent requests.  _______________________________________________________  Thank you for trusting me with your gastrointestinal care. Deanna May, RNP

## 2023-08-15 NOTE — Progress Notes (Signed)
 Chief Complaint:anemia Primary GI Doctor: Dr. Charlanne  HPI:  Patient is a  63  year old A.A. male patient with past medical history of DM type 2, NSTEMI, hypertension, Intracranial hemorrhage caused by malignant hypertension, bladder/protate CA, cystoprostatectomy, ileal conduit, who was referred to me by Madelon Isaiah HERO, on 06/10/23 for a complaint of anemia .   Follow-ups: 04/29/23 oncology note. Patient completed 4 rounds of chemotherapy ddMVAC .S/p cystoprostatectomy. CT CAP in 3 months about end of June.  06/11/23 seen by cardiology for follow-up.Atrial fib:was seen at the hospital with lightheadedness and was found to have atrial fib. Was started in Heparin , then eliquis   Reviewed note.   ED visits: 05/31/2023 patient seen in ED for presyncope, found to have new onset A-fib. Hemoglobin on arrival was low and then dropped further to 7.4 requiring 1U PRBCs given his history of MI. CT ordered which did not show any overt evidence of bleed. GI reviewed patient but did not see reason for inpatient endoscopy.   06/16/2023 ED visit for fatigue and generalized weakness.Also reporting right-sided neck pain. Blood work shows mild leukocytosis. Kidney function and anemia is around baseline. Urinalysis shows findings concerning for UTI. Will send culture. CT of the head and neck was done.  Imaging shows what looks like a diminutive vertebral artery on the right, occlusion was not ruled out. Consulted  neurology, Dr. Voncile, feel that this is an anatomical variant. Treated for UTI with Bactrim  and d/c home.  08/13/23 ED visit for flank pain.He has not noticed a change in the color/odor of his urine, changes his urostomy bag every 3 to 4 days.CBC notable for anemia with hemoglobin of 11.6, however this is improved from most recent lab results. BMP unremarkable. Urinalysis notable for small RBCs, leukocytes, nitrites, this is consistent with a urinary tract infection. CT abdomen/pelvis showed no acute  intra-abdominal or pelvic abnormality. EKG:NSR. Treated for uti with Ceftin  7 days.  Interval History   Patient presents for evaluation of anemia. Patient denies history of anemia. No blood in stool. No Hematemesis. Appetite good. Patient denies nausea, vomiting, or weight loss.  Patient denies diarrhea or constipation.  Patient reports he was taking oral iron couple of times a week but admits he has not been taking it daily. He denies fatigue, lightheadedness, or shortness of breath.  Patient was started on Eliquis  5 mg twice daily about a month ago for atrial fibrillation.  Never had EGD/colonoscopy.  Patient goes to gym three days a week.  Patient works as a Education officer, environmental.  Surgeries: lap cholecystomy, 03/29/23 Cystoprostatectomy and bilateral pelvic lymph node dissection, ileal conduit urinary diversion.   No family history of colon CA or GI issues.   Wt Readings from Last 3 Encounters:  08/15/23 291 lb 6.4 oz (132.2 kg)  08/15/23 289 lb 2 oz (131.1 kg)  08/05/23 294 lb (133.4 kg)    Past Medical History:  Diagnosis Date   Abnormal ultrasound of carotid artery 04/2014   no significant obstruction   Ataxia 04/2014   Atrial fibrillation (HCC)    Cancer (HCC)    Diabetes (HCC)    EKG abnormalities 04/2014   poor R wave progression, NSR   Former smoker    25 years x 1.5 ppd, stopped 04/2014   Gait disturbance 04/2014   s/p stroke   H/O echocardiogram 04/2014   moderate LVH, 60-65% EF, no valve disease   History of heart attack    Hypertension    Hypertensive heart disease 04/2014  Intraparenchymal hemorrhage of brain [facility]) 05/05/2014   hospitalization [facility]   Myocardial infarction [facility])    Obesity    Prediabetes    Short-term memory loss 04/2014   Sleep apnea    Stroke [facility]) 04/2014    Past Surgical History:  Procedure Laterality Date   APPENDECTOMY     CHOLECYSTECTOMY     CIRCUMCISION     CYSTOSCOPY N/A 09/26/2022   Procedure: CYSTOSCOPY;  Surgeon:  Devere Lonni Righter, MD;  Location: [facility];  Service: Urology;  Laterality: N/A;  60 MINUTES   HEMORRHOID SURGERY     IR IMAGING GUIDED PORT INSERTION  11/08/2022   IR REMOVAL TUN ACCESS W/ PORT W/O FL MOD SED  07/01/2023   ROBOT LAP RADICAL CYSTOPROSTATECTOMY PELVIC LYMPHADENECTOMY, NEOBLADDER N/A 03/29/2023   Procedure: CYSTOSCOPY WITH ICG/ROBOT ASSISTED LAPAROSCOPIC RADICAL CYSTOPROSTATECTOMY;  Surgeon: Alvaro Ricardo KATHEE Mickey., MD;  Location: [facility];  Service: Urology;  Laterality: N/A;    Current Outpatient Medications  Medication Sig Dispense Refill   acetaminophen  (TYLENOL ) 500 MG tablet Take 500 mg by mouth every 6 (six) hours as needed for moderate pain (pain score 4-6).     amLODipine  (NORVASC ) 10 MG tablet Take 1 tablet (10 mg total) by mouth daily. 90 tablet 3   apixaban  (ELIQUIS ) 5 MG TABS tablet Take 1 tablet (5 mg total) by mouth 2 (two) times daily. 60 tablet 0   blood glucose meter kit and supplies KIT Dispense based on patient and insurance preference. Check fasting blood sugar once daily ICD10 R73.03 1 each 0   cefUROXime  (CEFTIN ) 500 MG tablet Take 1 tablet (500 mg total) by mouth 2 (two) times daily with a meal for 7 days. 14 tablet 0   Cholecalciferol (VITAMIN D3 MAXIMUM STRENGTH) 125 MCG (5000 UT) capsule Take 5,000 Units by mouth daily.     diclofenac  Sodium (VOLTAREN ) 1 % GEL Apply 4 g topically 4 (four) times daily as needed (pain).     gabapentin  (NEURONTIN ) 100 MG capsule Take 2 tablets at bedtime (200 mg) 60 capsule 0   glucose blood test strip Use as instructed to monitor FSBS 1x daily. Dx: R73.09 100 each 12   labetalol  (NORMODYNE ) 200 MG tablet TAKE 1 TABLET(200 MG) BY MOUTH TWICE DAILY 180 tablet 0   Lancets (ONETOUCH ULTRASOFT) lancets Use as instructed 100 each 12   magnesium  chloride (SLOW-MAG) 64 MG TBEC SR tablet Take 1 tablet by mouth daily.     MOUNJARO  12.5 MG/0.5ML Pen Inject 12.5 mg into the skin once a week. On Monday     Multiple Vitamins-Minerals  (ONE-A-DAY MENS 50+ ADVANTAGE) TABS Take 1 tablet by mouth daily.     pantoprazole  (PROTONIX ) 40 MG tablet Take 1 tablet (40 mg total) by mouth 2 (two) times daily. (Patient not taking: Reported on 08/15/2023) 60 tablet 0   No current facility-administered medications for this visit.    Allergies as of 08/15/2023   (No Known Allergies)    Family History  Problem Relation Age of Onset   Heart disease Mother    Hypertension Mother    Sudden death Mother    Hypertension Brother    Aneurysm Paternal Grandmother     Review of Systems:    Constitutional: No weight loss, fever, chills, weakness or fatigue HEENT: Eyes: No change in vision               Ears, Nose, Throat:  No change in hearing or congestion Skin: No rash or itching  Cardiovascular: No chest pain, chest pressure or palpitations   Respiratory: No SOB or cough Gastrointestinal: See HPI and otherwise negative Genitourinary: No dysuria or change in urinary frequency Neurological: No headache, dizziness or syncope Musculoskeletal: No new muscle or joint pain Hematologic: No bleeding or bruising Psychiatric: No history of depression or anxiety    Physical Exam:  Vital signs: BP (!) 142/80 (BP Location: Right Wrist, Patient Position: Sitting, Cuff Size: Normal)   Pulse 90   Ht 6' 8 (2.032 m)   Wt 289 lb 2 oz (131.1 kg)   BMI 31.76 kg/m   Constitutional:   Pleasant A.A. male appears to be in NAD, Well developed, Well nourished, alert and cooperative Head:  Normocephalic and atraumatic. Eyes:   PEERL, EOMI. No icterus. Conjunctiva pink. Ears:  Normal auditory acuity. Neck:  Supple Throat: Oral cavity and pharynx without inflammation, swelling or lesion.  Respiratory: Respirations even and unlabored. Lungs clear to auscultation bilaterally.   No wheezes, crackles, or rhonchi.  Cardiovascular: Normal S1, S2. Regular rate and rhythm. No peripheral edema, cyanosis or pallor.  Gastrointestinal:  Soft, nondistended,  nontender. No rebound or guarding. Normal bowel sounds. No appreciable masses or hepatomegaly. Rectal:  Not performed.  Anoscopy: Msk:  Symmetrical without gross deformities. Without edema, no deformity or joint abnormality.  Neurologic:  Alert and  oriented x4;  grossly normal neurologically.  Skin:   Dry and intact without significant lesions or rashes. Psychiatric: Oriented to person, place and time. Demonstrates good judgement and reason without abnormal affect or behaviors.  RELEVANT LABS AND IMAGING: CBC    Latest Ref Rng & Units 08/13/2023   11:41 AM 07/29/2023   10:08 AM 06/16/2023    9:50 AM  CBC  WBC 4.0 - 10.5 K/uL 5.8  5.2  14.9   Hemoglobin 13.0 - 17.0 g/dL 88.3  89.0  9.6   Hematocrit 39.0 - 52.0 % 35.9  33.2  29.3   Platelets 150 - 400 K/uL 201  229  223      CMP     Latest Ref Rng & Units 08/13/2023   11:41 AM 07/29/2023   10:08 AM 06/16/2023    9:50 AM  CMP  Glucose 70 - 99 mg/dL 881  868  811   BUN 8 - 23 mg/dL 15  17  21    Creatinine 0.61 - 1.24 mg/dL 8.84  8.62  8.51   Sodium 135 - 145 mmol/L 139  141  133   Potassium 3.5 - 5.1 mmol/L 4.0  4.1  3.7   Chloride 98 - 111 mmol/L 106  111  103   CO2 22 - 32 mmol/L 22  24  20    Calcium  8.9 - 10.3 mg/dL 9.4  9.5  9.2   Total Protein 6.5 - 8.1 g/dL  7.3    Total Bilirubin 0.0 - 1.2 mg/dL  0.5    Alkaline Phos 38 - 126 U/L  54    AST 15 - 41 U/L  19    ALT 0 - 44 U/L  20       Lab Results  Component Value Date   TSH 1.217 06/01/2023   Lab Results  Component Value Date   IRON 55 07/29/2023   TIBC 263 07/29/2023   FERRITIN 100 07/29/2023  09/05/2023 CT abd/pelvis IMPRESSION: No acute intra-abdominal or pelvic abnormality. Aortic Atherosclerosis (ICD10-I70.0). 06/03/23 CTAP IMPRESSION: 1. No embolism to the segmental pulmonary artery level. No acute thoracic aortic syndrome. 2. No acute inflammatory process identified  within the abdomen or pelvis. 3. There are multiple peripheral wedge-shaped  ill-defined hypoattenuating areas in bilateral kidneys, which are nonspecific but can be seen as a sequela of pyelonephritis. Correlate clinically and with urinalysis. 4. Status post cystoprostatectomy. Right upper quadrant urostomy noted. 5. Multiple other nonacute observations, as described above.   06/01/23 Echo- Left ventricular ejection fraction, by estimation, is 60 to 65%.    Assessment: Encounter Diagnoses  Name Primary?   Iron deficiency anemia, unspecified iron deficiency anemia type Yes   Paroxysmal atrial fibrillation (HCC)    History of prostate cancer    History of ileal conduit    History of bladder cancer        63 year old A. A male patient with history of bladder/protate CA, cystoprostatectomy, ileal  who presents for iron deficiency anemia . Anemia noted since Oct 2024 , Hgb 13.1 in Sept 2024, now 11.6, dropped as low as 7.4 in [place]. Required blood transfusion 1 unit during hospitalization.  Patient taking oral iron and has improved. No overt bleeding.  Patient was placed on Eliquis  about 2 months ago for newly diagnosed atrial fibrillation.  His hemoglobin has improved as of recent but still considered anemic.  We discussed recommendations to do upper GI endoscopy and colonoscopy to evaluate for source of bleeding.  Patient has never had a colonoscopy done.  We would need to get cardiac clearance from his cardiologist as he just recently started the Eliquis  and this Dashonna Chagnon be something that would need to be deferred till later time.  If so we can closely watch patient's hemoglobin as well as would have him continue the oral iron.  ER precautions given for reporting any signs and symptoms of active bleeding.  Plan: -continue oral iron supplementation -monitor H/H -recommend EGD/colonoscopy for evaluation of anemia once cleared by cardiology and ok to hold Eliquis .   -ER precautions given  Thank you for the courtesy of this consult. Please call me with any questions or  concerns.   Maisey Deandrade, FNP-C [facility] 08/15/2023, 12:41 PM  Cc: Jeffrie Oneil BROCKS, MD

## 2023-08-15 NOTE — Assessment & Plan Note (Addendum)
 B12 1000 mcg daily Repeat with next visit in about 3 months

## 2023-08-16 ENCOUNTER — Telehealth: Payer: Self-pay

## 2023-08-16 LAB — URINE CULTURE: Culture: >=100000 — AB

## 2023-08-16 NOTE — Telephone Encounter (Signed)
   Name: Brad [name]  DOB: 08-31-1960  MRN: [id number]  Primary Cardiologist: Oneil Parchment, MD   Preoperative team, please contact this patient and set up a phone call appointment for further preoperative risk assessment. Please obtain consent and complete medication review. Thank you for your help.  I confirm that guidance regarding antiplatelet and oral anticoagulation therapy has been completed and, if necessary, noted below.  Procedure: Endoscopy Procedure  Date of procedure: TBD     CHA2DS2-VASc Score = 5  This indicates a 7.2% annual risk of stroke. The patient's score is based upon: CHF History: 0 HTN History: 1 Diabetes History: 1 Stroke History: 2 Vascular Disease History: 1 Age Score: 0 Gender Score: 0       CrCl 126 ml/min Platelet count 201K     Per office protocol, patient can hold Eliquis  for 2 days prior to procedure.    I also confirmed the patient resides in the state of [place] . As per [facility] Medical Board telemedicine laws, the patient must reside in the state in which the provider is licensed.   Artist Pouch, PA-C 08/16/2023, 8:02 AM [facility]

## 2023-08-16 NOTE — Telephone Encounter (Signed)
  Patient Consent for Virtual Visit        Brad [name] has provided verbal consent on 08/16/2023 for a virtual visit (video or telephone).   CONSENT FOR VIRTUAL VISIT FOR:  Brad [name]  By participating in this virtual visit I agree to the following:  I hereby voluntarily request, consent and authorize [facility] and its employed or contracted physicians, physician assistants, nurse practitioners or other licensed health care professionals (the Practitioner), to provide me with telemedicine health care services (the "Services) as deemed necessary by the treating Practitioner. I acknowledge and consent to receive the Services by the Practitioner via telemedicine. I understand that the telemedicine visit will involve communicating with the Practitioner through live audiovisual communication technology and the disclosure of certain medical information by electronic transmission. I acknowledge that I have been given the opportunity to request an in-person assessment or other available alternative prior to the telemedicine visit and am voluntarily participating in the telemedicine visit.  I understand that I have the right to withhold or withdraw my consent to the use of telemedicine in the course of my care at any time, without affecting my right to future care or treatment, and that the Practitioner or I may terminate the telemedicine visit at any time. I understand that I have the right to inspect all information obtained and/or recorded in the course of the telemedicine visit and may receive copies of available information for a reasonable fee.  I understand that some of the potential risks of receiving the Services via telemedicine include:  Delay or interruption in medical evaluation due to technological equipment failure or disruption; Information transmitted may not be sufficient (e.g. poor resolution of images) to allow for appropriate medical decision making by the  Practitioner; and/or  In rare instances, security protocols could fail, causing a breach of personal health information.  Furthermore, I acknowledge that it is my responsibility to provide information about my medical history, conditions and care that is complete and accurate to the best of my ability. I acknowledge that Practitioner's advice, recommendations, and/or decision may be based on factors not within their control, such as incomplete or inaccurate data provided by me or distortions of diagnostic images or specimens that may result from electronic transmissions. I understand that the practice of medicine is not an exact science and that Practitioner makes no warranties or guarantees regarding treatment outcomes. I acknowledge that a copy of this consent can be made available to me via my patient portal [facility] MyChart), or I can request a printed copy by calling the office of [facility].    I understand that my insurance will be billed for this visit.   I have read or had this consent read to me. I understand the contents of this consent, which adequately explains the benefits and risks of the Services being provided via telemedicine.  I have been provided ample opportunity to ask questions regarding this consent and the Services and have had my questions answered to my satisfaction. I give my informed consent for the services to be provided through the use of telemedicine in my medical care

## 2023-08-16 NOTE — Telephone Encounter (Signed)
 Preop tele appt now scheduled, med rec and consent done

## 2023-08-17 ENCOUNTER — Telehealth (HOSPITAL_BASED_OUTPATIENT_CLINIC_OR_DEPARTMENT_OTHER): Payer: Self-pay | Admitting: *Deleted

## 2023-08-17 NOTE — Telephone Encounter (Signed)
 Post ED Visit - Positive Culture Follow-up: Successful Patient Follow-Up  Culture assessed and recommendations reviewed by:  []  Rankin Dee, Pharm.D. []  Venetia Gully, Pharm.D., BCPS AQ-ID []  Garrel Crews, Pharm.D., BCPS []  Almarie Lunger, [address].D., BCPS []  [place], [address].D., BCPS, AAHIVP []  Rosaline Bihari, Pharm.D., BCPS, AAHIVP []  Vernell Meier, PharmD, BCPS []  Latanya Hint, PharmD, BCPS []  Donald Medley, PharmD, BCPS [x]  Eleanor Agent, PharmD  Positive urine culture  Plan: Finish Cefuroxime  Start Amoxicillin 500mg  TID x 7 days  Changes discussed with ED provider: Fonda Ruby, PA  New antibiotic prescription Amoxicillin 500mg  TID x 7 days Called to [facility] [place], [place], [place]  Contacted patient, date 08/17/23, time 1045   Albino Alan Novak 08/17/2023, 10:45 AM

## 2023-08-26 ENCOUNTER — Ambulatory Visit: Attending: Internal Medicine | Admitting: Student

## 2023-08-26 DIAGNOSIS — Z01810 Encounter for preprocedural cardiovascular examination: Secondary | ICD-10-CM

## 2023-08-26 NOTE — Progress Notes (Signed)
 Virtual Visit via Telephone Note   Because of Brad [name]'s co-morbid illnesses, he is at least at moderate risk for complications without adequate follow up.  This format is felt to be most appropriate for this patient at this time.  The patient did not have access to video technology/had technical difficulties with video requiring transitioning to audio format only (telephone).  All issues noted in this document were discussed and addressed.  No physical exam could be performed with this format.  Please refer to the patient's chart for his consent to telehealth for [facility]  Evaluation Performed:  Preoperative cardiovascular risk assessment _____________   Date:  08/26/2023   Patient ID:  Brad [name], DOB 07-Jun-1960, MRN [id number] Patient Location:  Home Provider location:   Office  Primary Care Provider:  Janna Ferrier, [name] Primary Cardiologist:  Brad Parchment, [name]  Chief Complaint / Patient Profile   63 y.o. y/o male with a h/o CAD, PAF on anticoagulation, hypertension, hyperlipidemia, OSA, T2DM, bladder cancer s/p urostomy, CVA who is pending endoscopy by Brad [name] and presents today for telephonic preoperative cardiovascular risk assessment.  History of Present Illness    Brad [name] is a 63 y.o. male who presents via audio/video conferencing for a telehealth visit today.  Pt was last seen in cardiology clinic on 06/11/2023 by Brad [name].  At that time Brad [name] was stable from a cardiac standpoint.  The patient is now pending procedure as outlined above. Since his last visit, he is doing well. Patient denies shortness of breath, dyspnea on exertion, lower extremity edema, orthopnea or PND. No chest pain, pressure, or tightness. No palpitations.  No lightheadedness or dizziness. He is very active going to [employer] for cardio and light weight training at least 3 times a week.   Past Medical History    Past Medical History:  Diagnosis Date    Abnormal ultrasound of carotid artery 04/2014   no significant obstruction   Ataxia 04/2014   Atrial fibrillation (HCC)    Cancer (HCC)    Diabetes (HCC)    EKG abnormalities 04/2014   poor R wave progression, NSR   Former smoker    25 years x 1.5 ppd, stopped 04/2014   Gait disturbance 04/2014   s/p stroke   H/O echocardiogram 04/2014   moderate LVH, 60-65% EF, no valve disease   History of heart attack    Hypertension    Hypertensive heart disease 04/2014   Intraparenchymal hemorrhage of brain [facility]) 05/05/2014   hospitalization [facility]   Myocardial infarction [facility])    Obesity    Prediabetes    Short-term memory loss 04/2014   Sleep apnea    Stroke (HCC) 04/2014   Past Surgical History:  Procedure Laterality Date   APPENDECTOMY     CHOLECYSTECTOMY     CIRCUMCISION     CYSTOSCOPY N/A 09/26/2022   Procedure: CYSTOSCOPY;  Surgeon: Brad Lonni Righter, [name];  Location: [facility];  Service: Urology;  Laterality: N/A;  60 MINUTES   HEMORRHOID SURGERY     IR IMAGING GUIDED PORT INSERTION  11/08/2022   IR REMOVAL TUN ACCESS W/ PORT W/O FL MOD SED  07/01/2023   ROBOT LAP RADICAL CYSTOPROSTATECTOMY PELVIC LYMPHADENECTOMY, NEOBLADDER N/A 03/29/2023   Procedure: CYSTOSCOPY WITH ICG/ROBOT ASSISTED LAPAROSCOPIC RADICAL CYSTOPROSTATECTOMY;  Surgeon: Brad Ricardo KATHEE Mickey., [name];  Location: [facility];  Service: Urology;  Laterality: N/A;    Allergies  No Known Allergies  Home Medications  Prior to Admission medications   Medication Sig Start Date End Date Taking? Authorizing Provider  acetaminophen  (TYLENOL ) 500 MG tablet Take 500 mg by mouth every 6 (six) hours as needed for moderate pain (pain score 4-6).    Provider, Historical, [name]  amLODipine  (NORVASC ) 10 MG tablet Take 1 tablet (10 mg total) by mouth daily. 12/15/20   Brad Frieze, [name]  apixaban  (ELIQUIS ) 5 MG TABS tablet Take 1 tablet (5 mg total) by mouth 2 (two) times daily. 08/12/23   Brad [name]  blood  glucose meter kit and supplies KIT Dispense based on patient and insurance preference. Check fasting blood sugar once daily ICD10 R73.03 10/11/16   Brad Theodoro FALCON, [name]  Cholecalciferol (VITAMIN D3 MAXIMUM STRENGTH) 125 MCG (5000 UT) capsule Take 5,000 Units by mouth daily.    Provider, Historical, [name]  diclofenac  Sodium (VOLTAREN ) 1 % GEL Apply 4 g topically 4 (four) times daily as needed (pain). 06/04/23   Brad [name]  gabapentin  (NEURONTIN ) 100 MG capsule Take 2 tablets at bedtime (200 mg) 08/15/23   Brad [name]  glucose blood test strip Use as instructed to monitor FSBS 1x daily. Dx: R73.09 06/07/17   Brad Theodoro FALCON, [name]  labetalol  (NORMODYNE ) 200 MG tablet TAKE 1 TABLET(200 MG) BY MOUTH TWICE DAILY 06/06/23   Brad Budds, [name]  Lancets [facility] ULTRASOFT) lancets Use as instructed 10/11/16   Brad Theodoro FALCON, [name]  magnesium  chloride (SLOW-MAG) 64 MG TBEC SR tablet Take 1 tablet by mouth daily.    Provider, Historical, [name]  MOUNJARO  12.5 MG/0.5ML Pen Inject 12.5 mg into the skin once a week. On Monday 05/28/23   Provider, Historical, [name]  Multiple Vitamins-Minerals (ONE-A-DAY MENS 50+ ADVANTAGE) TABS Take 1 tablet by mouth daily.    Provider, Historical, [name]  pantoprazole  (PROTONIX ) 40 MG tablet Take 1 tablet (40 mg total) by mouth 2 (two) times daily. 06/04/23 06/03/24  Brad [name]    Physical Exam    Vital Signs:  Brad [name] does not have vital signs available for review today.  Given telephonic nature of communication, physical exam is limited. AAOx3. NAD. Normal affect.  Speech and respirations are unlabored.   Assessment & Plan    Primary Cardiologist: Brad Parchment, [name]  Preoperative cardiovascular risk assessment.  Endoscopy by Brad [name].  Chart reviewed as part of pre-operative protocol coverage. According to the RCRI, patient has a 5-6.6% risk of MACE. Patient reports activity equivalent to 4.0 METS (cardio and light weight training at [employer] up to 3 times  a week).   Given past medical history and time since last visit, based on ACC/AHA guidelines, Jabes Glennie Rodda would be at acceptable risk for the planned procedure without further cardiovascular testing.   Patient was advised that if he develops new symptoms prior to surgery to contact our office to arrange a follow-up appointment.  he verbalized understanding.  Per Pharm D, patient may hold Eliquis  for 2 days prior to procedure.     I will route this recommendation to the requesting party via Epic fax function.  Please call with questions.  Time:   Today, I have spent 5 minutes with the patient with telehealth technology discussing medical history, symptoms, and management plan.     Barnie Hila, NP  08/26/2023, 8:03 AM

## 2023-08-27 ENCOUNTER — Ambulatory Visit: Admitting: Family Medicine

## 2023-08-27 ENCOUNTER — Telehealth: Payer: Self-pay

## 2023-08-27 ENCOUNTER — Encounter: Payer: Self-pay | Admitting: Family Medicine

## 2023-08-27 VITALS — BP 146/90 | HR 76 | Ht >= 80 in | Wt 290.8 lb

## 2023-08-27 DIAGNOSIS — D509 Iron deficiency anemia, unspecified: Secondary | ICD-10-CM

## 2023-08-27 DIAGNOSIS — Z1211 Encounter for screening for malignant neoplasm of colon: Secondary | ICD-10-CM

## 2023-08-27 DIAGNOSIS — E1169 Type 2 diabetes mellitus with other specified complication: Secondary | ICD-10-CM | POA: Diagnosis not present

## 2023-08-27 DIAGNOSIS — I10 Essential (primary) hypertension: Secondary | ICD-10-CM | POA: Diagnosis not present

## 2023-08-27 LAB — POCT GLYCOSYLATED HEMOGLOBIN (HGB A1C): HbA1c, POC (controlled diabetic range): 4.8 % (ref 0.0–7.0)

## 2023-08-27 MED ORDER — NA SULFATE-K SULFATE-MG SULF 17.5-3.13-1.6 GM/177ML PO SOLN
1.0000 | Freq: Once | ORAL | 0 refills | Status: AC
Start: 2023-08-27 — End: 2023-08-27

## 2023-08-27 NOTE — Progress Notes (Signed)
 Patient is schedule for double procedure 09/16/23 @ 9:30am

## 2023-08-27 NOTE — Progress Notes (Signed)
    SUBJECTIVE:   CHIEF COMPLAINT / HPI:   T2DM - Only on Mounjaro  - Does not check his sugars at home - Not on a statin  HTN - Elevated BP and compliant with medications - Used to be on losartan  but was discontinued during previous hospitalization noted by patient  UTI - Completed Cefitin for UTI, had some flank pain at the time which has improved  PERTINENT  PMH / PSH: MI, A-fib, T2DM, Bladder Cancer s/p chemo, Urostomy bag, Cystoprostatectomy  OBJECTIVE:   BP (!) 146/90   Pulse 76   Ht 6' 8 (2.032 m)   Wt 290 lb 12.8 oz (131.9 kg)   SpO2 100%   BMI 31.95 kg/m   General: Awake and Alert in NAD HEENT: NCAT. Sclera anicteric. No rhinorrhea. Cardiovascular: RRR. No M/R/G Respiratory: CTAB, normal WOB on RA. No wheezing, crackles, rhonchi, or diminished breath sounds. Abdomen: Soft, non-tender, non-distended. Urostomy bag in place Extremities: Able to move all extremities. No BLE edema, no deformities or significant joint findings. Skin: Warm and dry. No abrasions or rashes noted. Neuro: A&Ox3. No focal neurological deficits.  ASSESSMENT/PLAN:   Assessment & Plan Type 2 diabetes mellitus with other specified complication, without long-term current use of insulin  (HCC) - Last A1c 6.6 in 03/2023, POC A1c 4.8 today - Home CBGs: not checking - Medications: Mounjaro  12.5 mg / 0.5 mL - Adherence: good - Eye exam: Advised scheduling - Microalbumin: Ordered today - Statin: Not on a statin, will check lipid panel today Essential hypertension BP elevated on upon recheck today. - Continue current amlodipine  and labetalol  regimen - Consider adding back Losartan  at next visit to help manage BP better   [name], DO [facility]

## 2023-08-27 NOTE — Telephone Encounter (Signed)
Schedule procedures

## 2023-08-27 NOTE — Assessment & Plan Note (Addendum)
-   Last A1c 6.6 in 03/2023, POC A1c 4.8 today - Home CBGs: not checking - Medications: Mounjaro  12.5 mg / 0.5 mL - Adherence: good - Eye exam: Advised scheduling - Microalbumin: Ordered today - Statin: Not on a statin, will check lipid panel today

## 2023-08-27 NOTE — Assessment & Plan Note (Signed)
 BP elevated on upon recheck today. - Continue current amlodipine  and labetalol  regimen - Consider adding back Losartan  at next visit to help manage BP better

## 2023-08-27 NOTE — Patient Instructions (Signed)
 It was great to see you today! Thank you for choosing [facility] for your primary care. Brad [name] was seen for ED follow up.  Today we addressed: Concern for pyelonephritis was treated with antibiotics in the ED. Flank pain has improved. Diabetes - rechecking A1c today and getting urine microalbumin to see if there is protein in your urine. May need to adjust your blood pressure regimen accordingly to help with this Blood pressure - slightly elevated on exam, please make sure you are eating less salty or fried foods and staying hydrated.  Continue taking her medications as prescribed.  Please schedule a physical in the next 4 weeks to address healthcare gaps. We are checking some labs today. I will send you a MyChart message with your results, per your preference. If you do not hear about your labs in the next 2 weeks, please call the office.  You should return to our clinic No follow-ups on file. Please arrive 15 minutes before your appointment to ensure smooth check in process.  We appreciate your efforts in making this happen.  Thank you for allowing me to participate in your care, [name], DO 08/27/2023, 4:01 PM PGY-2, [facility]

## 2023-08-28 LAB — LIPID PANEL
Chol/HDL Ratio: 2.2 ratio (ref 0.0–5.0)
Cholesterol, Total: 113 mg/dL (ref 100–199)
HDL: 51 mg/dL (ref >39)
LDL Chol Calc (NIH): 50 mg/dL (ref 0–99)
Triglycerides: 50 mg/dL (ref 0–149)
VLDL Cholesterol Cal: 12 mg/dL (ref 5–40)

## 2023-08-28 LAB — MICROALBUMIN / CREATININE URINE RATIO
Creatinine, Urine: 92.5 mg/dL
Microalb/Creat Ratio: 25 mg/g{creat} (ref 0–29)
Microalbumin, Urine: 22.9 ug/mL

## 2023-08-29 ENCOUNTER — Ambulatory Visit: Payer: Self-pay | Admitting: Family Medicine

## 2023-08-29 NOTE — Telephone Encounter (Signed)
 Inbound call from patient requesting a call to discuss questions regarding his procedures. Please advise.

## 2023-08-30 NOTE — Telephone Encounter (Signed)
Returned call to patient. Answered all questions.

## 2023-09-02 ENCOUNTER — Ambulatory Visit (INDEPENDENT_AMBULATORY_CARE_PROVIDER_SITE_OTHER): Admitting: Family Medicine

## 2023-09-02 VITALS — BP 151/90 | HR 80 | Ht >= 80 in | Wt 293.1 lb

## 2023-09-02 DIAGNOSIS — R109 Unspecified abdominal pain: Secondary | ICD-10-CM | POA: Diagnosis not present

## 2023-09-02 DIAGNOSIS — Z23 Encounter for immunization: Secondary | ICD-10-CM

## 2023-09-02 LAB — POCT URINALYSIS DIP (MANUAL ENTRY)
Bilirubin, UA: NEGATIVE
Glucose, UA: NEGATIVE mg/dL
Ketones, POC UA: NEGATIVE mg/dL
Nitrite, UA: POSITIVE — AB
Protein Ur, POC: NEGATIVE mg/dL
Spec Grav, UA: 1.015 (ref 1.010–1.025)
Urobilinogen, UA: 0.2 U/dL
pH, UA: 7 (ref 5.0–8.0)

## 2023-09-02 LAB — POCT UA - MICROSCOPIC ONLY: Epithelial cells, urine per micros: NONE SEEN

## 2023-09-02 MED ORDER — SOLIFENACIN SUCCINATE 5 MG PO TABS
5.0000 mg | ORAL_TABLET | Freq: Every day | ORAL | 0 refills | Status: AC
Start: 2023-09-02 — End: ?

## 2023-09-02 NOTE — Patient Instructions (Addendum)
 It was great to see you! Thank you for allowing me to participate in your care!  Our plans for today:  - I will let you know the results of your urine tests. - If positive I will send antibiotics to your pharmacy. - Please call your urologist to make an appointment at this number [phone] - You may continue to take Tylenol  as needed for pain. - I have sent the medicine Vesicare  to your pharmacy. This is a daily pill that can help relax your urinary system and may help with your pain.   Please arrive 15 minutes PRIOR to your next scheduled appointment time! If you do not, this affects OTHER patients' care.  Take care and seek immediate care sooner if you develop any concerns.   [name], MD, PGY-3 [facility] 2:37 PM 09/02/2023  [facility]

## 2023-09-02 NOTE — Progress Notes (Signed)
    SUBJECTIVE:   CHIEF COMPLAINT / HPI: flank pain  Right flank pain Intermittently ongoing since Monday [facility] last 2 to 3 minutes 2-3 times per day Has happened several times in the past month Has gone to the ED and been treated for UTI which has improved his symptoms Denies fevers, chills, nausea, vomiting Eating and drinking normally History of bladder/prostate cancer with urostomy placement Has not had a kidney stone before  PERTINENT  PMH / PSH: HTN, OSA, Hx of STEMI, A fib, T2DM, Bladder cancer urostomy, Hx of prostate cancer, hx of ileal conduit with complications  OBJECTIVE:   BP (!) 151/90   Pulse 80   Ht 6' 8 (2.032 m)   Wt 293 lb 2 oz (133 kg)   SpO2 100%   BMI 32.20 kg/m   General: NAD, well appearing Neuro: A&O Respiratory: normal WOB on RA Extremities: Moving all 4 extremities equally Abdomen: Urostomy bag in place, no obvious stoma drainage, purulence, erythema, all quadrants nontender to palpation, no rebound or guarding  ASSESSMENT/PLAN:   Assessment & Plan Right flank pain Likely ureteral spasm in the setting of previous urostomy secondary to bladder and prostate cancer resection.  Recent treatment for UTI on 07/15 with CT scan that did not show renal stones at that same date. - Repeat urine culture, UA - Trial of Vesicare  5 mg daily - Counseled that he should follow-up with urology and provided number to call  Return if symptoms worsen or fail to improve.  [name], MD [facility]

## 2023-09-03 NOTE — Telephone Encounter (Signed)
 Per Pharm D, patient may hold Eliquis  for 2 days prior to procedure.    See telephone note.  Patient is aware and verbalized understanding.

## 2023-09-04 LAB — URINE CULTURE

## 2023-09-05 ENCOUNTER — Other Ambulatory Visit (HOSPITAL_COMMUNITY)

## 2023-09-06 ENCOUNTER — Ambulatory Visit: Payer: Self-pay | Admitting: Family Medicine

## 2023-09-10 ENCOUNTER — Ambulatory Visit (INDEPENDENT_AMBULATORY_CARE_PROVIDER_SITE_OTHER): Admitting: Family Medicine

## 2023-09-10 VITALS — BP 123/79 | HR 68 | Ht >= 80 in | Wt 294.6 lb

## 2023-09-10 DIAGNOSIS — M7591 Shoulder lesion, unspecified, right shoulder: Secondary | ICD-10-CM | POA: Diagnosis not present

## 2023-09-10 DIAGNOSIS — R109 Unspecified abdominal pain: Secondary | ICD-10-CM

## 2023-09-10 MED ORDER — MIRABEGRON ER 25 MG PO TB24: 25.0000 mg | ORAL_TABLET | Freq: Every day | ORAL | 0 refills | Status: DC

## 2023-09-10 NOTE — Patient Instructions (Addendum)
 It was great to see you! Thank you for allowing me to participate in your care!  Our plans for today:  - I think you have some rotator cuff tendinitis. This will get better if you take a break from lifts that cause pain. - I have printed some exercised to strengthen you rotator cuff. - Please let me know if you have any sudden changes in your flank pain. - I am happy to see you again in 2 weeks.   Please arrive 15 minutes PRIOR to your next scheduled appointment time! If you do not, this affects OTHER patients' care.  Take care and seek immediate care sooner if you develop any concerns.   [name], MD, PGY-3 [facility] 9:51 AM 09/10/2023  [facility]

## 2023-09-10 NOTE — Progress Notes (Signed)
    SUBJECTIVE:   CHIEF COMPLAINT / HPI: Continued flank pain  Flank pain Seen by me 8 days ago for likely ureteral spasm. Started on Vesicare  5 mg daily at that time. Saw urologist, patient reports surgeon said pain was from something related to his surgery Pain is intermittent and tolerable Does not want to get sepsis  Should pain Does have pain in shoulder and neck Chronic Positional with leaning arm May be from lifting weights Reported he increased his weight from ~30lbs to 60-80 suddenly Stopped a couple weeks after it started hurting. Also has chronic back pain.  PERTINENT  PMH / PSH: History of bladder and prostate cancer s/p urostomy, history of ileal conduit with complications, osteoarthritis of the spine  OBJECTIVE:   BP 123/79   Pulse 68   Ht 6' 8 (2.032 m)   Wt 294 lb 9.6 oz (133.6 kg)   SpO2 95%   BMI 32.36 kg/m   General: NAD, well appearing Neuro: A&O Respiratory: normal WOB on RA Extremities: Moving all 4 extremities equally Abdomen: soft, non tender, no rebound or guarding Right shoulder: No obvious deformity, swelling, erythema.  Mildly tender to palpation over the anterior shoulder, no point tenderness along the clavicle, acromion, scapular spine.  Patient has full range of motion in shoulder flexion, abduction, adduction, internal, external rotation limited to 60 degrees, pain with crossarm test, pain with empty can test, negative Hawkins.  No objective weakness of resisted shoulder abduction and flexion  ASSESSMENT/PLAN:   Assessment & Plan Right flank pain Ongoing flank pain likely secondary to ureteral spasm in the setting of complicated prostate and bladder cancer history status post ileal conduit with urostomy.  Will add Myrbetriq  5 mg daily in addition to continuing Vesicare . Right supraspinatus tendinitis Exam consistent with supraspinatus tendinitis versus partial tear.  Counseled to avoid heavy lifting on the shoulder, avoid exercises that  cause pain.  Provided with rotator cuff strengthening.  Follow-up 6 weeks.  Return if symptoms worsen or fail to improve.  Brad Provencal, MD [facility]

## 2023-09-11 ENCOUNTER — Other Ambulatory Visit: Payer: Self-pay

## 2023-09-11 ENCOUNTER — Telehealth: Payer: Self-pay

## 2023-09-11 ENCOUNTER — Other Ambulatory Visit: Payer: Self-pay | Admitting: Family Medicine

## 2023-09-11 DIAGNOSIS — Z436 Encounter for attention to other artificial openings of urinary tract: Secondary | ICD-10-CM

## 2023-09-11 DIAGNOSIS — Z9889 Other specified postprocedural states: Secondary | ICD-10-CM

## 2023-09-11 NOTE — Telephone Encounter (Signed)
 Prior authorization submitted for Mirabegron  ER 25MG  er tablets to [employer] Part D via Latent.   Key: [id number]

## 2023-09-11 NOTE — Telephone Encounter (Signed)
 Pharmacy Patient Advocate Encounter  Received notification from [employer] that Prior Authorization for Mirabegron  ER 25MG  er tablets has been DENIED.  Full denial letter will be uploaded to the media tab. See denial reason below.  The requested drug is not on your plan's formulary (list of covered drugs).   To receive a formulary exception your prescriber must provide information that documents at least one of the following has occurred:  - You have tried the formulary drugs for the treatment of your condition and they did not work for you. OR  - The formulary drugs could cause adverse effects. OR  - The formulary drugs would be less effective for your condition than the requested drug.  covered alternatives include Gemtesa, Myrbetriq , Toviaz, Tolterodine Tartrate ER, Trospium Chloride, Solifenacin  Succinate, and Oxybutynin .   PA #/Case ID/Reference #: [id number]

## 2023-09-12 ENCOUNTER — Other Ambulatory Visit (HOSPITAL_COMMUNITY): Payer: Self-pay

## 2023-09-12 MED ORDER — MIRABEGRON ER 25 MG PO TB24: 25.0000 mg | ORAL_TABLET | Freq: Every day | ORAL | 0 refills | Status: AC

## 2023-09-12 NOTE — Addendum Note (Signed)
 Addended by: ALBA SHARPER on: 09/12/2023 10:07 AM   Modules accepted: Orders

## 2023-09-12 NOTE — Telephone Encounter (Signed)
 Brand name filled 09/10/23.

## 2023-09-13 ENCOUNTER — Other Ambulatory Visit: Payer: Self-pay

## 2023-09-13 DIAGNOSIS — E1159 Type 2 diabetes mellitus with other circulatory complications: Secondary | ICD-10-CM

## 2023-09-13 MED ORDER — LABETALOL HCL 200 MG PO TABS: ORAL_TABLET | ORAL | 3 refills | Status: AC

## 2023-09-16 ENCOUNTER — Encounter: Payer: Self-pay | Admitting: Gastroenterology

## 2023-09-16 ENCOUNTER — Ambulatory Visit: Admitting: Gastroenterology

## 2023-09-16 VITALS — BP 150/97 | HR 64 | Temp 97.3°F | Resp 12 | Ht >= 80 in | Wt 289.0 lb

## 2023-09-16 DIAGNOSIS — K2970 Gastritis, unspecified, without bleeding: Secondary | ICD-10-CM

## 2023-09-16 DIAGNOSIS — K319 Disease of stomach and duodenum, unspecified: Secondary | ICD-10-CM | POA: Diagnosis not present

## 2023-09-16 DIAGNOSIS — D509 Iron deficiency anemia, unspecified: Secondary | ICD-10-CM

## 2023-09-16 DIAGNOSIS — K259 Gastric ulcer, unspecified as acute or chronic, without hemorrhage or perforation: Secondary | ICD-10-CM

## 2023-09-16 DIAGNOSIS — Z9889 Other specified postprocedural states: Secondary | ICD-10-CM

## 2023-09-16 DIAGNOSIS — K648 Other hemorrhoids: Secondary | ICD-10-CM | POA: Diagnosis present

## 2023-09-16 DIAGNOSIS — Z1211 Encounter for screening for malignant neoplasm of colon: Secondary | ICD-10-CM

## 2023-09-16 DIAGNOSIS — K3189 Other diseases of stomach and duodenum: Secondary | ICD-10-CM

## 2023-09-16 MED ORDER — SODIUM CHLORIDE 0.9 % IV SOLN: 500.0000 mL | Freq: Once | INTRAVENOUS | Status: DC

## 2023-09-16 MED ORDER — PANTOPRAZOLE SODIUM 40 MG PO TBEC: 40.0000 mg | DELAYED_RELEASE_TABLET | Freq: Two times a day (BID) | ORAL | 3 refills | Status: AC

## 2023-09-16 NOTE — Progress Notes (Signed)
 Sedate, gd SR, tolerated procedure well, VSS, report to RN

## 2023-09-16 NOTE — Progress Notes (Signed)
 Called to room to assist during endoscopic procedure.  Patient ID and intended procedure confirmed with present staff. Received instructions for my participation in the procedure from the performing physician.

## 2023-09-16 NOTE — Progress Notes (Signed)
 GASTROENTEROLOGY PROCEDURE H&P NOTE   Primary Care Physician: Gomes, Adriana, DO    Reason for Procedure:  Iron deficiency anemia  Plan:    EGD, colonoscopy  Patient is appropriate for endoscopic procedure(s) in the ambulatory ([facility]) setting.  The nature of the procedure, as well as the risks, benefits, and alternatives were carefully and thoroughly reviewed with the patient. Ample time for discussion and questions allowed. The patient understood, was satisfied, and agreed to proceed.     HPI: Brad [name] is a 63 y.o. male who presents for EGD and colonoscopy for evaluation of iron deficiency anemia.  History of bladder/prostate cancer now s/p cystoprostatectomy, bilateral pelvic lymph node dissection, ileal conduit for urinary diversion in 03/2023 and chemotherapy (completed in June).  Recent hospital admission in 05/31/2023 for presyncope and found to have new onset A-fib and anemia with hemoglobin 7.4.  Transfuse 1 unit RBCs.  No overt bleeding on CT.  Was started on Eliquis  for new onset A-fib.  Otherwise no GI symptoms.  No previous EGD or colonoscopy.  Despite clearance to hold anticoagulation and recommendation to hold for 2 days, last dose of Eliquis  was yesterday.  Explained to the patient that this may limit some of our diagnostic and potentially therapeutic capabilities, and he would still like to proceed with EGD/colonoscopy as scheduled today.  Otherwise no significant changes in clinical history since last OV on 08/15/2023.  Past Medical History:  Diagnosis Date   Abnormal ultrasound of carotid artery 04/2014   no significant obstruction   Ataxia 04/2014   Atrial fibrillation (HCC)    Cancer (HCC)    Diabetes (HCC)    EKG abnormalities 04/2014   poor R wave progression, NSR   Former smoker    25 years x 1.5 ppd, stopped 04/2014   Gait disturbance 04/2014   s/p stroke   H/O echocardiogram 04/2014   moderate LVH, 60-65% EF, no valve disease   History of  heart attack    Hypertension    Hypertensive heart disease 04/2014   Intraparenchymal hemorrhage of brain [facility]) 05/05/2014   hospitalization [facility]   Myocardial infarction [facility])    Obesity    Prediabetes    Short-term memory loss 04/2014   Sleep apnea    Stroke (HCC) 04/2014    Past Surgical History:  Procedure Laterality Date   APPENDECTOMY     CHOLECYSTECTOMY     CIRCUMCISION     CYSTOSCOPY N/A 09/26/2022   Procedure: CYSTOSCOPY;  Surgeon: Devere Lonni Righter, MD;  Location: [facility];  Service: Urology;  Laterality: N/A;  60 MINUTES   HEMORRHOID SURGERY     IR IMAGING GUIDED PORT INSERTION  11/08/2022   IR REMOVAL TUN ACCESS W/ PORT W/O FL MOD SED  07/01/2023   ROBOT LAP RADICAL CYSTOPROSTATECTOMY PELVIC LYMPHADENECTOMY, NEOBLADDER N/A 03/29/2023   Procedure: CYSTOSCOPY WITH ICG/ROBOT ASSISTED LAPAROSCOPIC RADICAL CYSTOPROSTATECTOMY;  Surgeon: Alvaro Ricardo KATHEE Mickey., MD;  Location: [facility];  Service: Urology;  Laterality: N/A;    Prior to Admission medications   Medication Sig Start Date End Date Taking? Authorizing Provider  amLODipine  (NORVASC ) 10 MG tablet Take 1 tablet (10 mg total) by mouth daily. 12/15/20  Yes Prentiss Frieze, DO  amoxicillin (AMOXIL) 500 MG capsule Take 500 mg by mouth 3 (three) times daily. 08/17/23  Yes [provider]  apixaban  (ELIQUIS ) 5 MG TABS tablet TAKE 1 TABLET(5 MG) BY MOUTH TWICE DAILY 09/12/23  Yes Janna, Adriana, DO  blood glucose meter kit and  supplies KIT Dispense based on patient and insurance preference. Check fasting blood sugar once daily ICD10 R73.03 10/11/16  Yes [place], Theodoro FALCON, MD  Cholecalciferol (VITAMIN D3 MAXIMUM STRENGTH) 125 MCG (5000 UT) capsule Take 5,000 Units by mouth daily.   Yes [provider]  glucose blood test strip Use as instructed to monitor FSBS 1x daily. Dx: R73.09 06/07/17  Yes [place], Theodoro FALCON, MD  labetalol  (NORMODYNE ) 200 MG tablet TAKE 1 TABLET(200 MG) BY MOUTH TWICE DAILY  09/13/23  Yes Janna Ferrier, DO  Lancets [employer] ULTRASOFT) lancets Use as instructed 10/11/16  Yes , Theodoro FALCON, MD  mirabegron  ER (MYRBETRIQ ) 25 MG TB24 tablet Take 1 tablet (25 mg total) by mouth daily. 09/12/23  Yes Alba Sharper, MD  acetaminophen  (TYLENOL ) 500 MG tablet Take 500 mg by mouth every 6 (six) hours as needed for moderate pain (pain score 4-6).    [provider]  diclofenac  Sodium (VOLTAREN ) 1 % GEL Apply 4 g topically 4 (four) times daily as needed (pain). 06/04/23   Elicia Hamlet, MD  gabapentin  (NEURONTIN ) 100 MG capsule Take 2 tablets at bedtime (200 mg) 08/15/23   [name] Pauletta BROCKS, MD  MOUNJARO  12.5 MG/0.5ML Pen Inject 12.5 mg into the skin once a week. On Monday 05/28/23   [provider]  Multiple Vitamins-Minerals (ONE-A-DAY MENS 50+ ADVANTAGE) TABS Take 1 tablet by mouth daily.    [provider]  solifenacin  (VESICARE ) 5 MG tablet Take 1 tablet (5 mg total) by mouth daily. 09/02/23   Alba Sharper, MD    Current Outpatient Medications  Medication Sig Dispense Refill   amLODipine  (NORVASC ) 10 MG tablet Take 1 tablet (10 mg total) by mouth daily. 90 tablet 3   amoxicillin (AMOXIL) 500 MG capsule Take 500 mg by mouth 3 (three) times daily.     apixaban  (ELIQUIS ) 5 MG TABS tablet TAKE 1 TABLET(5 MG) BY MOUTH TWICE DAILY 90 tablet 3   blood glucose meter kit and supplies KIT Dispense based on patient and insurance preference. Check fasting blood sugar once daily ICD10 R73.03 1 each 0   Cholecalciferol (VITAMIN D3 MAXIMUM STRENGTH) 125 MCG (5000 UT) capsule Take 5,000 Units by mouth daily.     glucose blood test strip Use as instructed to monitor FSBS 1x daily. Dx: R73.09 100 each 12   labetalol  (NORMODYNE ) 200 MG tablet TAKE 1 TABLET(200 MG) BY MOUTH TWICE DAILY 180 tablet 3   Lancets (ONETOUCH ULTRASOFT) lancets Use as instructed 100 each 12   mirabegron  ER (MYRBETRIQ ) 25 MG TB24 tablet Take 1 tablet (25 mg total) by mouth daily. 30 tablet 0    acetaminophen  (TYLENOL ) 500 MG tablet Take 500 mg by mouth every 6 (six) hours as needed for moderate pain (pain score 4-6).     diclofenac  Sodium (VOLTAREN ) 1 % GEL Apply 4 g topically 4 (four) times daily as needed (pain).     gabapentin  (NEURONTIN ) 100 MG capsule Take 2 tablets at bedtime (200 mg) 60 capsule 3   MOUNJARO  12.5 MG/0.5ML Pen Inject 12.5 mg into the skin once a week. On Monday     Multiple Vitamins-Minerals (ONE-A-DAY MENS 50+ ADVANTAGE) TABS Take 1 tablet by mouth daily.     solifenacin  (VESICARE ) 5 MG tablet Take 1 tablet (5 mg total) by mouth daily. 30 tablet 0   Current Facility-Administered Medications  Medication Dose Route Frequency Provider Last Rate Last Admin   0.9 %  sodium chloride  infusion  500 mL Intravenous Once Kaydan Wilhoite V, DO  Allergies as of 09/16/2023   (No Known Allergies)    Family History  Problem Relation Age of Onset   Heart disease Mother    Hypertension Mother    Sudden death Mother    Hypertension Brother    Aneurysm Paternal Grandmother     Social History   Socioeconomic History   Marital status: Married    Spouse name: Proofreader   Number of children: 2   Years of education: 12   Highest education level: 12th grade  Occupational History   Occupation: Retired   Tobacco Use   Smoking status: Former    Current packs/day: 0.00    Average packs/day: 1.5 packs/day for 25.0 years (37.5 ttl pk-yrs)    Types: Cigarettes    Start date: 05/04/1989    Quit date: 05/05/2014    Years since quitting: 9.3    Passive exposure: Past   Smokeless tobacco: Never  Vaping Use   Vaping status: Never Used  Substance and Sexual Activity   Alcohol use: No    Alcohol/week: 0.0 standard drinks of alcohol   Drug use: No   Sexual activity: Not on file  Other Topics Concern   Not on file  Social History Narrative   Lives at home with wife   Married to [name], non denominational, works at [employer] in [place], [place].  From  [place], but moved to [place].     Caffeine use: Drinks tea occass   Social Drivers of Health   Financial Resource Strain: Low Risk  (08/05/2023)   Overall Financial Resource Strain (CARDIA)    Difficulty of Paying Living Expenses: Not hard at all  Food Insecurity: No Food Insecurity (08/05/2023)   Hunger Vital Sign    Worried About Running Out of Food in the Last Year: Never true    Ran Out of Food in the Last Year: Never true  Transportation Needs: No Transportation Needs (08/05/2023)   PRAPARE - Administrator, Civil Service (Medical): No    Lack of Transportation (Non-Medical): No  Physical Activity: Insufficiently Active (08/05/2023)   Exercise Vital Sign    Days of Exercise per Week: 3 days    Minutes of Exercise per Session: 30 min  Stress: No Stress Concern Present (08/05/2023)   [employer] of Occupational Health - Occupational Stress Questionnaire    Feeling of Stress: Only a little  Social Connections: Unknown (08/05/2023)   Social Connection and Isolation Panel    Frequency of Communication with Friends and Family: Once a week    Frequency of Social Gatherings with Friends and Family: Once a week    Attends Religious Services: More than 4 times per year    Active Member of [employer] or Organizations: Patient declined    Attends Banker Meetings: Patient declined    Marital Status: Married  Catering manager Violence: Not At Risk (08/05/2023)   Humiliation, Afraid, Rape, and Kick questionnaire    Fear of Current or Ex-Partner: No    Emotionally Abused: No    Physically Abused: No    Sexually Abused: No    Physical Exam: Vital signs in last 24 hours: @BP  (!) 140/92   Pulse 71   Temp (!) 97.3 F (36.3 C) (Temporal)   Ht 6' 8 (2.032 m)   Wt 289 lb (131.1 kg)   SpO2 97%   BMI 31.75 kg/m  GEN: NAD EYE: Sclerae anicteric ENT: MMM CV: Non-tachycardic Pulm: CTA b/l GI: Soft, NT/ND NEURO:  Alert & Oriented x 3   Sandor Flatter,  DO  Gastroenterology   09/16/2023 10:10 AM

## 2023-09-16 NOTE — Op Note (Signed)
 [facility] Patient Name: Brad [name] Procedure Date: 09/16/2023 10:45 AM MRN: [id number] Endoscopist: Sandor Flatter , MD, [id number] Age: 63 Referring MD:  Date of Birth: Aug 22, 1960 Gender: Male Account #: [id number] Procedure:                Upper GI endoscopy Indications:              Iron deficiency anemia Medicines:                Monitored Anesthesia Care Procedure:                Pre-Anesthesia Assessment:                           - Prior to the procedure, a History and Physical                            was performed, and patient medications and                            allergies were reviewed. The patient's tolerance of                            previous anesthesia was also reviewed. The risks                            and benefits of the procedure and the sedation                            options and risks were discussed with the patient.                            All questions were answered, and informed consent                            was obtained. Prior Anticoagulants: The patient has                            taken Eliquis  (apixaban ), last dose was 1 day prior                            to procedure. ASA Grade Assessment: III - A patient                            with severe systemic disease. After reviewing the                            risks and benefits, the patient was deemed in                            satisfactory condition to undergo the procedure.                           After obtaining informed consent, the endoscope was  passed under direct vision. Throughout the                            procedure, the patient's blood pressure, pulse, and                            oxygen saturations were monitored continuously. The                            Olympus Scope SN [id number] was introduced through the                            mouth, and advanced to the second part of duodenum.                            The  upper GI endoscopy was accomplished without                            difficulty. The patient tolerated the procedure                            well. Scope In: Scope Out: Findings:                 The examined esophagus was normal.                           There were small flecks of old blood in the gastric                            body. This was easily lavaged and cleared without                            any active bleeding noted. Moderate inflammation                            characterized by congestion (edema), erosions,                            erythema and shallow ulcerations was found in the                            gastric body and in the gastric antrum. Biopsies                            were taken with a cold forceps for Helicobacter                            pylori testing. Estimated blood loss was minimal.                           Localized mildly erythematous mucosa without active  bleeding was found in the duodenal bulb and in the                            first portion of the duodenum. Biopsies were taken                            with a cold forceps for histology. Estimated blood                            loss was minimal.                           The second portion of the duodenum was normal.                            Additional mucosal biopsies were taken with a cold                            forceps for histology. Estimated blood loss was                            minimal. Complications:            No immediate complications. Estimated Blood Loss:     Estimated blood loss was minimal. Impression:               - Normal esophagus.                           - Gastritis. Biopsied.                           - Erythematous duodenopathy. Biopsied.                           - Normal second portion of the duodenum. Biopsied. Recommendation:           - Patient has a contact number available for                             emergencies. The signs and symptoms of potential                            delayed complications were discussed with the                            patient. Return to normal activities tomorrow.                            Written discharge instructions were provided to the                            patient.                           - Resume previous diet.                           -  Resume Eliquis  (apixaban ) at prior dose tomorrow.                           - Use Protonix  (pantoprazole ) 40 mg PO BID for 6                            weeks to promote healing of gastritis and gastric                            ulcer/erosions, then reduce to 40 mg daily. If                            resolution of anemia and no further need for acid                            suppression therapy long-term, can potentially                            discontinue.                           - Avoid NSAIDs.                           - Await pathology results.                           - Perform a colonoscopy today. Sandor Flatter, MD 09/16/2023 11:22:51 AM

## 2023-09-16 NOTE — Progress Notes (Signed)
 Patient reports taking his Eliquis  yesterday on 09/15/2023 and reported he ate 2 sausage biscuits on 09/15/23 at 1300.   Dr. San made aware. RN explained to patient, per Dr. San, will not be able to remove any large polyp and may or may not take biopsies. Patient made aware and still wants to proceed with procedure.   Pt's states no medical or surgical changes since previsit or office visit.

## 2023-09-16 NOTE — Op Note (Signed)
 [facility] Patient Name: Brad [name] Procedure Date: 09/16/2023 10:44 AM MRN: [id number] Endoscopist: Sandor Flatter , MD, [id number] Age: 63 Referring MD:  Date of Birth: [birth year] Gender: Male Account #: [id number] Procedure:                Colonoscopy Indications:              Iron deficiency anemia Medicines:                Monitored Anesthesia Care Procedure:                Pre-Anesthesia Assessment:                           - Prior to the procedure, a History and Physical                            was performed, and patient medications and                            allergies were reviewed. The patient's tolerance of                            previous anesthesia was also reviewed. The risks                            and benefits of the procedure and the sedation                            options and risks were discussed with the patient.                            All questions were answered, and informed consent                            was obtained. Prior Anticoagulants: The patient has                            taken Eliquis  (apixaban ), last dose was 1 day prior                            to procedure. ASA Grade Assessment: III - A patient                            with severe systemic disease. After reviewing the                            risks and benefits, the patient was deemed in                            satisfactory condition to undergo the procedure.                           After obtaining informed consent, the colonoscope  was passed under direct vision. Throughout the                            procedure, the patient's blood pressure, pulse, and                            oxygen saturations were monitored continuously. The                            Olympus Scope SN: [id number] was introduced through                            the anus and advanced to the 5 cm into the ileum.                            The  colonoscopy was performed without difficulty.                            The patient tolerated the procedure well. The                            quality of the bowel preparation was good. The                            terminal ileum, ileocecal valve, appendiceal                            orifice, and rectum were photographed. Scope In: 11:01:59 AM Scope Out: 11:14:03 AM Scope Withdrawal Time: 0 hours 9 minutes 53 seconds  Total Procedure Duration: 0 hours 12 minutes 4 seconds  Findings:                 The perianal and digital rectal examinations were                            normal.                           The entire colon appeared normal.                           Non-bleeding internal hemorrhoids were found during                            retroflexion. The hemorrhoids were small.                           The terminal ileum appeared normal. Complications:            No immediate complications. Estimated Blood Loss:     Estimated blood loss: none. Impression:               - The entire examined colon is normal.                           - Non-bleeding internal hemorrhoids.                           -  The examined portion of the ileum was normal.                           - No specimens collected. Recommendation:           - Patient has a contact number available for                            emergencies. The signs and symptoms of potential                            delayed complications were discussed with the                            patient. Return to normal activities tomorrow.                            Written discharge instructions were provided to the                            patient.                           - Resume previous diet.                           - Continue present medications.                           - Resume Eliquis  (apixaban ) at prior dose tomorrow.                           - Repeat colonoscopy in 10 years for screening                             purposes.                           - Return to GI clinic at routine appointment to be                            scheduled.                           - Repeat CBC and iron panel in 3 months. Sandor Flatter, MD 09/16/2023 11:26:25 AM

## 2023-09-16 NOTE — Patient Instructions (Signed)
 Please read handouts provided. Continue present medications. Resume Eliquis  ( apixaban  ) at prior dose tomorrow.. Use Protonix  ( pantoprazole  ) 40 mg twice daily for 6 weeks, then reduce to 40 mg daily. If resolution of anemia and no further need for acid suppression therapy long-term, can potentially discontinue. Await pathology results. Repeat colonoscopy in 10 years for screening. Return to GI clinic for routine appointment. Repeat CBC and iron panel in 3 months.   YOU HAD AN ENDOSCOPIC PROCEDURE TODAY AT THE [facility]:   Refer to the procedure report that was given to you for any specific questions about what was found during the examination.  If the procedure report does not answer your questions, please call your gastroenterologist to clarify.  If you requested that your care partner not be given the details of your procedure findings, then the procedure report has been included in a sealed envelope for you to review at your convenience later.  YOU SHOULD EXPECT: Some feelings of bloating in the abdomen. Passage of more gas than usual.  Walking can help get rid of the air that was put into your GI tract during the procedure and reduce the bloating. If you had a lower endoscopy (such as a colonoscopy or flexible sigmoidoscopy) you may notice spotting of blood in your stool or on the toilet paper. If you underwent a bowel prep for your procedure, you may not have a normal bowel movement for a few days.  Please Note:  You might notice some irritation and congestion in your nose or some drainage.  This is from the oxygen used during your procedure.  There is no need for concern and it should clear up in a day or so.  SYMPTOMS TO REPORT IMMEDIATELY:  Following lower endoscopy (colonoscopy or flexible sigmoidoscopy):  Excessive amounts of blood in the stool  Significant tenderness or worsening of abdominal pains  Swelling of the abdomen that is new, acute  Fever of 100F or  higher  Following upper endoscopy (EGD)  Vomiting of blood or coffee ground material  New chest pain or pain under the shoulder blades  Painful or persistently difficult swallowing  New shortness of breath  Fever of 100F or higher  Black, tarry-looking stools  For urgent or emergent issues, a gastroenterologist can be reached at any hour by calling [phone]. Do not use MyChart messaging for urgent concerns.    DIET:  We do recommend a small meal at first, but then you may proceed to your regular diet.  Drink plenty of fluids but you should avoid alcoholic beverages for 24 hours.  ACTIVITY:  You should plan to take it easy for the rest of today and you should NOT DRIVE or use heavy machinery until tomorrow (because of the sedation medicines used during the test).    FOLLOW UP: Our staff will call the number listed on your records the next business day following your procedure.  We will call around 7:15- 8:00 am to check on you and address any questions or concerns that you may have regarding the information given to you following your procedure. If we do not reach you, we will leave a message.     If any biopsies were taken you will be contacted by phone or by letter within the next 1-3 weeks.  Please call us  at [phone] if you have not heard about the biopsies in 3 weeks.    SIGNATURES/CONFIDENTIALITY: You and/or your care partner have signed paperwork which will  be entered into your electronic medical record.  These signatures attest to the fact that that the information above on your After Visit Summary has been reviewed and is understood.  Full responsibility of the confidentiality of this discharge information lies with you and/or your care-partner.

## 2023-09-17 ENCOUNTER — Ambulatory Visit: Admitting: Family Medicine

## 2023-09-17 ENCOUNTER — Telehealth: Payer: Self-pay

## 2023-09-17 NOTE — Telephone Encounter (Signed)
  Follow up Call-     09/16/2023    9:21 AM  Call back number  Post procedure Call Back phone  # [phone]  Permission to leave phone message Yes     Patient questions:  Do you have a fever, pain , or abdominal swelling? No. Pain Score  0 *  Have you tolerated food without any problems? Yes.    Have you been able to return to your normal activities? Yes.    Do you have any questions about your discharge instructions: Diet   No. Medications  No. Follow up visit  No.  Do you have questions or concerns about your Care? No.  Actions: * If pain score is 4 or above: No action needed, pain <4.

## 2023-09-18 ENCOUNTER — Telehealth: Payer: Self-pay

## 2023-09-18 DIAGNOSIS — D509 Iron deficiency anemia, unspecified: Secondary | ICD-10-CM

## 2023-09-18 NOTE — Telephone Encounter (Signed)
 Patient made aware about lab work 1 week prior and he was scheduled for OV for 11-14 and mychart message sent to patient

## 2023-09-19 ENCOUNTER — Ambulatory Visit: Payer: Self-pay | Admitting: Gastroenterology

## 2023-09-19 LAB — SURGICAL PATHOLOGY

## 2023-09-23 ENCOUNTER — Telehealth: Payer: Self-pay

## 2023-09-23 NOTE — Telephone Encounter (Signed)
 LVM Asking pt to bring his CPAP Machine to his Appointment.

## 2023-09-23 NOTE — Progress Notes (Unsigned)
 PATIENT: Brad [name] DOB: 1960-10-08  REASON FOR VISIT: follow up HISTORY FROM: patient  No chief complaint on file.    HISTORY OF PRESENT ILLNESS:  09/23/23 ALL:  Brad [name] is a 63 y.o. male here today for follow up for OSA on CPAP.      HISTORY: (copied from Dr Obie previous note)  Brad [name] is a 63 year old right-handed gentleman with an underlying medical history of hypertension, prior lacunar strokes, hemorrhagic stroke in April 2016, prior smoking and obesity, who presents for follow up consultation of his obstructive sleep apnea, on CPAP therapy, for his yearly checkup. The patient is unaccompanied today. I last saw him on 09/21/2021, at which time he was compliant with his machine, he was not keen on getting a new machine as his machine was working fine.  He was working on weight loss, he was on Ozempic .  He was advised to follow-up routinely in 1 year.   Today, 09/24/2022: I reviewed his CPAP compliance data from 08/25/2022 through 09/23/2022, which is a total of 30 days, during which time he used his machine every night with percent use days greater than 4 hours at 100%, indicating superb compliance with an average usage of 8 hours and 0 minutes, residual AHI at goal at 1/h, leak acceptable with the 95th percentile at 15.6 L/min on a pressure of 7 cm with EPR of 1.  He reports doing well with his current machine.  He has not seen an error message on it.  He has not noticed any difficulty with the machine, it works well.  He is not keen on pursuing a home sleep test or a new machine quite yet.  He had recent workup for hematuria. Of note, he has an upcoming surgery for a bladder tumor.  He was found to have a bladder mass by his urologist.  He had a recent CT of the abdomen and pelvis with contrast on 08/28/2022 and I reviewed the results: IMPRESSION: 1. Contrast enhancing, lobulated endoluminal mass of the anterior bladder dome, measuring 2.6 x 2.0 x 1.2 cm, consistent  with primary bladder malignancy. 2. Prominent varices about the bladder dome and anterior bladder, without overt evidence of extramural tumor extent. 3. No evidence of lymphadenopathy or metastatic disease in the abdomen or pelvis. 4. Prostatomegaly.   Aortic Atherosclerosis (ICD10-I70.0).   REVIEW OF SYSTEMS: Out of a complete 14 system review of symptoms, the patient complains only of the following symptoms, and all other reviewed systems are negative.  ESS:  ALLERGIES: No Known Allergies  HOME MEDICATIONS: Outpatient Medications Prior to Visit  Medication Sig Dispense Refill   acetaminophen  (TYLENOL ) 500 MG tablet Take 500 mg by mouth every 6 (six) hours as needed for moderate pain (pain score 4-6).     amLODipine  (NORVASC ) 10 MG tablet Take 1 tablet (10 mg total) by mouth daily. 90 tablet 3   amoxicillin (AMOXIL) 500 MG capsule Take 500 mg by mouth 3 (three) times daily.     apixaban  (ELIQUIS ) 5 MG TABS tablet TAKE 1 TABLET(5 MG) BY MOUTH TWICE DAILY 90 tablet 3   blood glucose meter kit and supplies KIT Dispense based on patient and insurance preference. Check fasting blood sugar once daily ICD10 R73.03 1 each 0   Cholecalciferol (VITAMIN D3 MAXIMUM STRENGTH) 125 MCG (5000 UT) capsule Take 5,000 Units by mouth daily.     diclofenac  Sodium (VOLTAREN ) 1 % GEL Apply 4 g topically 4 (four) times daily as needed (pain).  gabapentin  (NEURONTIN ) 100 MG capsule Take 2 tablets at bedtime (200 mg) 60 capsule 3   glucose blood test strip Use as instructed to monitor FSBS 1x daily. Dx: R73.09 100 each 12   labetalol  (NORMODYNE ) 200 MG tablet TAKE 1 TABLET(200 MG) BY MOUTH TWICE DAILY 180 tablet 3   Lancets (ONETOUCH ULTRASOFT) lancets Use as instructed 100 each 12   mirabegron  ER (MYRBETRIQ ) 25 MG TB24 tablet Take 1 tablet (25 mg total) by mouth daily. 30 tablet 0   MOUNJARO  12.5 MG/0.5ML Pen Inject 12.5 mg into the skin once a week. On Monday     Multiple Vitamins-Minerals (ONE-A-DAY  MENS 50+ ADVANTAGE) TABS Take 1 tablet by mouth daily.     pantoprazole  (PROTONIX ) 40 MG tablet Take 1 tablet (40 mg total) by mouth 2 (two) times daily. Protonix  40 mg twice daily for 6 weeks, then reduce to 40 mg daily. 90 tablet 3   solifenacin  (VESICARE ) 5 MG tablet Take 1 tablet (5 mg total) by mouth daily. 30 tablet 0   No facility-administered medications prior to visit.    PAST MEDICAL HISTORY: Past Medical History:  Diagnosis Date   Abnormal ultrasound of carotid artery 04/2014   no significant obstruction   Ataxia 04/2014   Atrial fibrillation (HCC)    Cancer (HCC)    Diabetes (HCC)    EKG abnormalities 04/2014   poor R wave progression, NSR   Former smoker    25 years x 1.5 ppd, stopped 04/2014   Gait disturbance 04/2014   s/p stroke   H/O echocardiogram 04/2014   moderate LVH, 60-65% EF, no valve disease   History of heart attack    Hypertension    Hypertensive heart disease 04/2014   Intraparenchymal hemorrhage of brain [facility]) 05/05/2014   hospitalization [facility]   Myocardial infarction [facility])    Obesity    Prediabetes    Short-term memory loss 04/2014   Sleep apnea    Stroke (HCC) 04/2014    PAST SURGICAL HISTORY: Past Surgical History:  Procedure Laterality Date   APPENDECTOMY     CHOLECYSTECTOMY     CIRCUMCISION     CYSTOSCOPY N/A 09/26/2022   Procedure: CYSTOSCOPY;  Surgeon: Devere Lonni Righter, MD;  Location: [facility];  Service: Urology;  Laterality: N/A;  60 MINUTES   HEMORRHOID SURGERY     IR IMAGING GUIDED PORT INSERTION  11/08/2022   IR REMOVAL TUN ACCESS W/ PORT W/O FL MOD SED  07/01/2023   ROBOT LAP RADICAL CYSTOPROSTATECTOMY PELVIC LYMPHADENECTOMY, NEOBLADDER N/A 03/29/2023   Procedure: CYSTOSCOPY WITH ICG/ROBOT ASSISTED LAPAROSCOPIC RADICAL CYSTOPROSTATECTOMY;  Surgeon: Alvaro Ricardo KATHEE Mickey., MD;  Location: [facility];  Service: Urology;  Laterality: N/A;    FAMILY HISTORY: Family History  Problem Relation Age of Onset    Heart disease Mother    Hypertension Mother    Sudden death Mother    Hypertension Brother    Aneurysm Paternal Grandmother     SOCIAL HISTORY: Social History   Socioeconomic History   Marital status: Married    Spouse name: Proofreader   Number of children: 2   Years of education: 12   Highest education level: 12th grade  Occupational History   Occupation: Retired   Tobacco Use   Smoking status: Former    Current packs/day: 0.00    Average packs/day: 1.5 packs/day for 25.0 years (37.5 ttl pk-yrs)    Types: Cigarettes    Start date: 05/04/1989    Quit date: 05/05/2014  Years since quitting: 9.3    Passive exposure: Past   Smokeless tobacco: Never  Vaping Use   Vaping status: Never Used  Substance and Sexual Activity   Alcohol use: No    Alcohol/week: 0.0 standard drinks of alcohol   Drug use: No   Sexual activity: Not on file  Other Topics Concern   Not on file  Social History Narrative   Lives at home with wife   Married to [name], non denominational, works at [employer] in [place], [place].  From [place], but moved to [place].     Caffeine use: Drinks tea occass   Social Drivers of Health   Financial Resource Strain: Low Risk  (08/05/2023)   Overall Financial Resource Strain (CARDIA)    Difficulty of Paying Living Expenses: Not hard at all  Food Insecurity: No Food Insecurity (08/05/2023)   Hunger Vital Sign    Worried About Running Out of Food in the Last Year: Never true    Ran Out of Food in the Last Year: Never true  Transportation Needs: No Transportation Needs (08/05/2023)   PRAPARE - Administrator, Civil Service (Medical): No    Lack of Transportation (Non-Medical): No  Physical Activity: Insufficiently Active (08/05/2023)   Exercise Vital Sign    Days of Exercise per Week: 3 days    Minutes of Exercise per Session: 30 min  Stress: No Stress Concern Present (08/05/2023)   [employer] of Occupational Health - Occupational Stress  Questionnaire    Feeling of Stress: Only a little  Social Connections: Unknown (08/05/2023)   Social Connection and Isolation Panel    Frequency of Communication with Friends and Family: Once a week    Frequency of Social Gatherings with Friends and Family: Once a week    Attends Religious Services: More than 4 times per year    Active Member of [employer] or Organizations: Patient declined    Attends Banker Meetings: Patient declined    Marital Status: Married  Catering manager Violence: Not At Risk (08/05/2023)   Humiliation, Afraid, Rape, and Kick questionnaire    Fear of Current or Ex-Partner: No    Emotionally Abused: No    Physically Abused: No    Sexually Abused: No     PHYSICAL EXAM  There were no vitals filed for this visit. There is no height or weight on file to calculate BMI.  Generalized: Well developed, in no acute distress  Cardiology: normal rate and rhythm, no murmur noted Respiratory: clear to auscultation bilaterally  Neurological examination  Mentation: Alert oriented to time, place, history taking. Follows all commands speech and language fluent Cranial nerve II-XII: Pupils were equal round reactive to light. Extraocular movements were full, visual field were full  Motor: The motor testing reveals 5 over 5 strength of all 4 extremities. Good symmetric motor tone is noted throughout.  Gait and station: Gait is normal.    DIAGNOSTIC DATA (LABS, IMAGING, TESTING) - I reviewed patient records, labs, notes, testing and imaging myself where available.     08/05/2023    9:17 AM  MMSE - Mini Mental State Exam  Not completed: Unable to complete     Lab Results  Component Value Date   WBC 5.8 08/13/2023   HGB 11.6 (L) 08/13/2023   HCT 35.9 (L) 08/13/2023   MCV 93.0 08/13/2023   PLT 201 08/13/2023      Component Value Date/Time   NA 139 08/13/2023 1141   NA  141 06/10/2023 1545   K 4.0 08/13/2023 1141   CL 106 08/13/2023 1141   CO2 22 08/13/2023  1141   GLUCOSE 118 (H) 08/13/2023 1141   BUN 15 08/13/2023 1141   BUN 20 06/10/2023 1545   CREATININE 1.15 08/13/2023 1141   CREATININE 1.37 (H) 07/29/2023 1008   CREATININE 1.50 (H) 02/18/2018 1306   CALCIUM  9.4 08/13/2023 1141   PROT 7.3 07/29/2023 1008   PROT 7.2 08/27/2022 1535   ALBUMIN 4.1 07/29/2023 1008   ALBUMIN 4.5 08/27/2022 1535   AST 19 07/29/2023 1008   ALT 20 07/29/2023 1008   ALKPHOS 54 07/29/2023 1008   BILITOT 0.5 07/29/2023 1008   GFRNONAA >60 08/13/2023 1141   GFRNONAA 59 (L) 07/29/2023 1008   GFRAA 75 01/12/2020 1056   Lab Results  Component Value Date   CHOL 113 08/27/2023   HDL 51 08/27/2023   LDLCALC 50 08/27/2023   TRIG 50 08/27/2023   CHOLHDL 2.2 08/27/2023   Lab Results  Component Value Date   HGBA1C 4.8 08/27/2023   Lab Results  Component Value Date   VITAMINB12 809 07/29/2023   Lab Results  Component Value Date   TSH 1.217 06/01/2023     ASSESSMENT AND PLAN 63 y.o. year old male  has a past medical history of Abnormal ultrasound of carotid artery (04/2014), Ataxia (04/2014), Atrial fibrillation (HCC), Cancer (HCC), Diabetes (HCC), EKG abnormalities (04/2014), Former smoker, Gait disturbance (04/2014), H/O echocardiogram (04/2014), History of heart attack, Hypertension, Hypertensive heart disease (04/2014), Intraparenchymal hemorrhage of brain (HCC) (05/05/2014), Myocardial infarction (HCC), Obesity, Prediabetes, Short-term memory loss (04/2014), Sleep apnea, and Stroke (HCC) (04/2014). here with   No diagnosis found.    Terrin Wasyl Dornfeld is doing well on CPAP therapy. Compliance report reveals ***. *** was encouraged to continue using CPAP nightly and for greater than 4 hours each night. We will update supply orders as indicated. Risks of untreated sleep apnea review and education materials provided. Healthy lifestyle habits encouraged. *** will follow up in ***, sooner if needed. *** verbalizes understanding and agreement with this plan.     No orders of the defined types were placed in this encounter.    No orders of the defined types were placed in this encounter.     Greig Forbes, FNP-C 09/23/2023, 7:52 AM [facility] [address] [place], [place] [address] [phone]

## 2023-09-23 NOTE — Patient Instructions (Signed)

## 2023-09-24 ENCOUNTER — Ambulatory Visit (INDEPENDENT_AMBULATORY_CARE_PROVIDER_SITE_OTHER): Payer: Medicare Other | Admitting: Family Medicine

## 2023-09-24 ENCOUNTER — Encounter: Payer: Self-pay | Admitting: Family Medicine

## 2023-09-24 ENCOUNTER — Ambulatory Visit (INDEPENDENT_AMBULATORY_CARE_PROVIDER_SITE_OTHER): Payer: Self-pay | Admitting: Family Medicine

## 2023-09-24 VITALS — BP 160/92 | HR 69 | Ht >= 80 in | Wt 297.0 lb

## 2023-09-24 VITALS — BP 136/82 | HR 67 | Wt 296.0 lb

## 2023-09-24 DIAGNOSIS — Z122 Encounter for screening for malignant neoplasm of respiratory organs: Secondary | ICD-10-CM | POA: Diagnosis not present

## 2023-09-24 DIAGNOSIS — G4733 Obstructive sleep apnea (adult) (pediatric): Secondary | ICD-10-CM

## 2023-09-24 DIAGNOSIS — Z0000 Encounter for general adult medical examination without abnormal findings: Secondary | ICD-10-CM

## 2023-09-24 MED ORDER — IBUPROFEN 800 MG PO TABS: 800.0000 mg | ORAL_TABLET | Freq: Three times a day (TID) | ORAL | 3 refills | Status: AC | PRN

## 2023-09-24 NOTE — Progress Notes (Signed)
    SUBJECTIVE:   Chief compliant/HPI: annual examination  Brad [name] is a 63 y.o. who presents today for an annual exam.   History tabs reviewed and updated.   Review of systems form reviewed and notable for flank and shoulder pain after exercising at the gym since he tried to go up in weight to quickly.   OBJECTIVE:   BP 136/82   Pulse 67   Wt 296 lb (134.3 kg)   SpO2 99%   BMI 31.72 kg/m   General: Awake and Alert in NAD HEENT: NCAT. Sclera anicteric. No rhinorrhea. Cardiovascular: RRR. No M/R/G Respiratory: CTAB, normal WOB on RA. No wheezing, crackles, rhonchi, or diminished breath sounds. Abdomen: Soft, non-tender, non-distended. Bowel sounds normoactive. Urostomy bag in place. Extremities: Able to move all extremities. No BLE edema, no deformities or significant joint findings. Skin: Warm and dry. No abrasions or rashes noted. Neuro: A&Ox3. No focal neurological deficits.  Diabetic foot exam was performed.  No deformities or other abnormal visual findings.  Posterior tibialis and dorsalis pulse intact bilaterally.  Intact to touch and monofilament testing bilaterally.   ASSESSMENT/PLAN:   Assessment & Plan Annual physical exam See AVS for age appropriate recommendations.  PHQ score 1, reviewed and discussed.  Blood pressure value is at goal, discussed.   Considered the following screening exams based upon USPSTF recommendations: Diabetes screening: 4.8, 1 month ago Screening for elevated cholesterol: checked 1 month ago and normal HIV testing: checked previously and negative Hepatitis C: checked previously and negative Hepatitis B: not at increased risk Syphilis if at high risk: not at increased risk Reviewed risk factors for latent tuberculosis and not indicated Colorectal cancer screening: up to date on screening for CRC. Done 08/25 and normal, next due for another 10 years. Lung cancer screening: discussed See documentation below regarding discussion  and indication.   PSA discussed and after engaging in discussion of possible risks, benefits and complications of screening patient elected to allow urology continue to follow.   For flank and shoulder pain, advised stretches, taking it easy at the gym, and Tylenol  or ibuprofen  as needed for pain.  Refilled ibuprofen  800 mg every 8 hours as needed.  Follow up in 1 year or sooner if indicated.  MyChart Activation: Already signed up Screening for lung cancer Low dose CT recommended if age 14-80 years, 20 pack-year history currently smoking, or quit w/i 15 years: patient qualifies w/ 37.5 pack years and quit 7 years ago  [name], DO [facility]

## 2023-09-24 NOTE — Progress Notes (Deleted)
    SUBJECTIVE:   Chief compliant/HPI: annual examination  Brad [name] is a 63 y.o. who presents today for an annual exam.   History tabs reviewed and updated ***.   - Right shoulder pain and right flank pain. Gallballder removed a few years ago. Patient was also at the gym and and lifted heavy and aggravated shoulder more.  - May 5th diagnosed with Afib, fell on right knee, fell on left knee. Also diagnosed with osteoarthritis.  - Patient had a recent normal colonoscopy on 8/18 - Patient saw neurologist earlier today for sleep apnea  Review of systems form reviewed and notable for ***.   OBJECTIVE:   BP 136/82   Pulse 67   Wt 296 lb (134.3 kg)   SpO2 99%   BMI 31.72 kg/m   General: Awake and Alert in NAD HEENT: NCAT. Sclera anicteric. No rhinorrhea. Cardiovascular: RRR. No M/R/G Respiratory: CTAB, normal WOB on RA. No wheezing, crackles, rhonchi, or diminished breath sounds. Abdomen: Soft, non-tender, non-distended. Bowel sounds normoactive/hypoactive/hyperactive. *** Extremities: Able to move all extremities. No BLE edema, no deformities or significant joint findings. Skin: Warm and dry. No abrasions or rashes noted. Neuro: A&Ox***. No focal neurological deficits.  ASSESSMENT/PLAN:   Assessment & Plan  Annual Examination  See AVS for age appropriate recommendations.  PHQ score ***, reviewed and discussed.  Blood pressure value is *** goal, discussed.   Considered the following screening exams based upon USPSTF recommendations: Diabetes screening: 4.8, 1 month ago Screening for elevated cholesterol: checked 1 month ago and normal HIV testing: checked previously and negative Hepatitis C: checked previously and negative Hepatitis B: not at increased risk Syphilis if at high risk: not at increased risk Reviewed risk factors for latent tuberculosis and not indicated Colorectal cancer screening: up to date on screening for CRC. Done 08/25 and normal, next due for  another 10 years. Lung cancer screening: discussed See documentation below regarding discussion and indication.  - Low dose CT recommended if age 19-80 years, 20 pack-year history currently smoking, or quit w/i 15 years: patient qualifies w/ 37.5 pack years and quit 7 years ago PSA discussed and after engaging in discussion of possible risks, benefits and complications of screening patient elected to ***.   Follow up in 1 year or sooner if indicated.  MyChart Activation: Already signed up   Nonda Carrie, Medical Student [facility]

## 2023-09-24 NOTE — Patient Instructions (Addendum)
 It was great to see you today! Thank you for choosing [facility] for your primary care. Brad [name] was seen for annual physical.  Today we addressed: Keep up the great work! You are up to date on all recommendations! For your back/shoulder plan - do some stretches, remain active, and take tylenol  as needed Ordered low dose CT scan for lung cancer screening. Please schedule. Please follow up 6 months   You should return to our clinic No follow-ups on file. Please arrive 15 minutes before your appointment to ensure smooth check in process.  We appreciate your efforts in making this happen.  Thank you for allowing me to participate in your care, [name], DO 09/24/2023, 2:29 PM PGY-2, [facility]

## 2023-09-24 NOTE — Progress Notes (Signed)
 SABRA

## 2023-10-02 ENCOUNTER — Telehealth: Payer: Self-pay | Admitting: Family Medicine

## 2023-10-02 NOTE — Telephone Encounter (Signed)
 HST BCBS medicare pending.

## 2023-10-03 NOTE — Telephone Encounter (Signed)
 HST [employer]BS medicare no auth req via fax

## 2023-10-04 ENCOUNTER — Ambulatory Visit
Admission: RE | Admit: 2023-10-04 | Discharge: 2023-10-04 | Disposition: A | Discharge status: Home / Self Care | Source: Ambulatory Visit | Attending: Family Medicine | Admitting: Family Medicine

## 2023-10-04 DIAGNOSIS — Z122 Encounter for screening for malignant neoplasm of respiratory organs: Secondary | ICD-10-CM

## 2023-10-11 ENCOUNTER — Encounter: Payer: Self-pay | Admitting: Family Medicine

## 2023-10-11 ENCOUNTER — Ambulatory Visit: Admitting: Neurology

## 2023-10-11 DIAGNOSIS — G4733 Obstructive sleep apnea (adult) (pediatric): Secondary | ICD-10-CM

## 2023-10-11 DIAGNOSIS — R0683 Snoring: Secondary | ICD-10-CM

## 2023-10-18 ENCOUNTER — Ambulatory Visit: Payer: Self-pay | Admitting: Family Medicine

## 2023-10-29 NOTE — Progress Notes (Unsigned)
 SABRA

## 2023-10-31 NOTE — Procedures (Signed)
 [facility]  HOME SLEEP TEST (SANSA) REPORT (Mail-Out Device):   STUDY DATE: 10/14/2023  DOB: [birth year]  MRN: [id number]  ORDERING CLINICIAN: True Mar, MD, PhD   REFERRING CLINICIAN: Greig Forbes, NP  CLINICAL INFORMATION/HISTORY (obtained from visit note dated 09/24/2023): 63 year old [name] with an underlying medical history of hypertension, prior lacunar strokes, hemorrhagic stroke in April 2016, prior smoking and obesity, who presents for reevaluation of his obstructive sleep apnea.  He is compliant with his CPAP of 7 cm with EPR of 1 with good apnea control and good tolerance of treatment.  He should be eligible for a new machine.    BMI (at the time of sleep clinic visit and/or test date): 31.8 kg/m  FINDINGS:   Study Protocol:    The SANSA single-point-of-skin-contact chest-worn sensor - an FDA cleared and DOT approved type 4 home sleep test device - measures eight physiological channels,  including blood oxygen saturation (measured via PPG [photoplethysmography]), EKG-derived heart rate, respiratory effort, chest movement (measured via accelerometer), snoring, body position, and actigraphy. The device is designed to be worn for up to 10 hours per study.   Sleep Summary:   Total Recording Time (hours, min): 9 hours, 53 min  Total Effective Sleep Time (hours, min):  6 hours, 59 min  Sleep Efficiency (%):    71%   Respiratory Indices:   Calculated sAHI (per hour):  3.5/hour     (utilizing the 4% desaturation criteria for obstructive hypopneas per Medicare guidelines)           Oxygen Saturation Statistics:    Oxygen Saturation (%) Mean: 96.5%   Minimum oxygen saturation (%):                 83%   O2 Saturation Range (%): 83-99.9%   Time below or at 88% saturation: 0 min   Pulse Rate Statistics:   Pulse Mean (bpm):    74/min    Pulse Range (61-109/min)   Snoring: Mild to moderate  IMPRESSION/DIAGNOSES:   Primary  snoring  RECOMMENDATIONS:   This home sleep test does not demonstrate any significant obstructive sleep disordered breathing with an AHI of 3.5/h, O2 nadir 83% briefly without any significant time below or at 88% saturation for the night.  Mild to moderate snoring was detected.  Since the patient has a prior diagnosis of OSA and has been on CPAP therapy for years, I would recommend making sure that he conducted this study without his CPAP in place (the study may have to be repeated, if the patient used his CPAP machine during the night of testing).  If there is ongoing clinical concern for sleep disordered breathing and this study was indeed conducted without PAP therapy in place, consideration should be given to pursue a laboratory attended sleep study to confirm his OSA diagnosis with more reliability.   Based on this test result, utilizing the 4% desaturation criteria for obstructive hypopneas by Medicare guidelines, treatment with a positive airway pressure device may no longer be necessary.  Again, clinical correlation is recommended.   For disturbing snoring, an oral appliance through dentistry or orthodontics can be considered.  Other causes of the patient's symptoms, including circadian rhythm disturbances, an underlying mood disorder, medication effect and/or an underlying medical problem cannot be ruled out based on this test. Clinical correlation is recommended.  The patient should be cautioned not to drive, work at heights, or operate dangerous or heavy equipment when tired or sleepy. Review and reiteration of good  sleep hygiene measures should be pursued with any patient. The patient will be advised to follow up with his referring provider, who will be notified of the test results.   I certify that I have reviewed the raw data recording prior to the issuance of this report in accordance with the standards of the American Academy of Sleep Medicine (AASM).    INTERPRETING PHYSICIAN:    True Mar, MD, PhD Medical Director, [facility]) Diplomat, ABPN (Neurology and Sleep)   [facility] [address], [address] [place], [place] [address] [phone]

## 2023-11-05 ENCOUNTER — Ambulatory Visit: Payer: Self-pay | Admitting: Family Medicine

## 2023-11-05 ENCOUNTER — Ambulatory Visit (HOSPITAL_COMMUNITY)
Admission: RE | Admit: 2023-11-05 | Discharge: 2023-11-05 | Disposition: A | Discharge status: Home / Self Care | Source: Ambulatory Visit | Attending: *Deleted | Admitting: *Deleted

## 2023-11-05 DIAGNOSIS — L24B3 Irritant contact dermatitis related to fecal or urinary stoma or fistula: Secondary | ICD-10-CM | POA: Diagnosis not present

## 2023-11-05 DIAGNOSIS — Z932 Ileostomy status: Secondary | ICD-10-CM | POA: Insufficient documentation

## 2023-11-05 DIAGNOSIS — N99528 Other complication of other external stoma of urinary tract: Secondary | ICD-10-CM | POA: Diagnosis not present

## 2023-11-05 NOTE — Progress Notes (Signed)
 [facility]   Reason for visit:  RLQ ileal conduit  Here today concerned with serum filled blister to peristomal skin at perimeter of pouching area HPI:  Bladder cancer with cystectomy  Past Medical History:  Diagnosis Date   Abnormal ultrasound of carotid artery 04/2014   no significant obstruction   Ataxia 04/2014   Atrial fibrillation (HCC)    Cancer (HCC)    Diabetes (HCC)    EKG abnormalities 04/2014   poor R wave progression, NSR   Former smoker    25 years x 1.5 ppd, stopped 04/2014   Gait disturbance 04/2014   s/p stroke   H/O echocardiogram 04/2014   moderate LVH, 60-65% EF, no valve disease   History of heart attack    Hypertension    Hypertensive heart disease 04/2014   Intraparenchymal hemorrhage of brain (HCC) 05/05/2014   hospitalization [facility]   Myocardial infarction [facility])    Obesity    Prediabetes    Short-term memory loss 04/2014   Sleep apnea    Stroke (HCC) 04/2014   Family History  Problem Relation Age of Onset   Heart disease Mother    Hypertension Mother    Sudden death Mother    Hypertension Brother    Aneurysm Paternal Grandmother    No Known Allergies Current Outpatient Medications  Medication Sig Dispense Refill Last Dose/Taking   acetaminophen  (TYLENOL ) 500 MG tablet Take 500 mg by mouth every 6 (six) hours as needed for moderate pain (pain score 4-6).      amLODipine  (NORVASC ) 10 MG tablet Take 1 tablet (10 mg total) by mouth daily. 90 tablet 3    amoxicillin (AMOXIL) 500 MG capsule Take 500 mg by mouth 3 (three) times daily. (Patient not taking: Reported on 09/24/2023)      apixaban  (ELIQUIS ) 5 MG TABS tablet TAKE 1 TABLET(5 MG) BY MOUTH TWICE DAILY 90 tablet 3    blood glucose meter kit and supplies KIT Dispense based on patient and insurance preference. Check fasting blood sugar once daily ICD10 R73.03 (Patient not taking: Reported on 09/24/2023) 1 each 0    Cholecalciferol (VITAMIN D3 MAXIMUM STRENGTH) 125  MCG (5000 UT) capsule Take 5,000 Units by mouth daily.      diclofenac  Sodium (VOLTAREN ) 1 % GEL Apply 4 g topically 4 (four) times daily as needed (pain). (Patient not taking: Reported on 09/24/2023)      gabapentin  (NEURONTIN ) 100 MG capsule Take 2 tablets at bedtime (200 mg) 60 capsule 3    glucose blood test strip Use as instructed to monitor FSBS 1x daily. Dx: R73.09 (Patient not taking: Reported on 09/24/2023) 100 each 12    ibuprofen  (ADVIL ) 800 MG tablet Take 1 tablet (800 mg total) by mouth every 8 (eight) hours as needed. 30 tablet 3    labetalol  (NORMODYNE ) 200 MG tablet TAKE 1 TABLET(200 MG) BY MOUTH TWICE DAILY 180 tablet 3    Lancets (ONETOUCH ULTRASOFT) lancets Use as instructed (Patient not taking: Reported on 09/24/2023) 100 each 12    mirabegron  ER (MYRBETRIQ ) 25 MG TB24 tablet Take 1 tablet (25 mg total) by mouth daily. 30 tablet 0    MOUNJARO  12.5 MG/0.5ML Pen Inject 12.5 mg into the skin once a week. On Monday      Multiple Vitamins-Minerals (ONE-A-DAY MENS 50+ ADVANTAGE) TABS Take 1 tablet by mouth daily.      pantoprazole  (PROTONIX ) 40 MG tablet Take 1 tablet (40 mg total) by mouth 2 (two) times daily.  Protonix  40 mg twice daily for 6 weeks, then reduce to 40 mg daily. 90 tablet 3    solifenacin  (VESICARE ) 5 MG tablet Take 1 tablet (5 mg total) by mouth daily. 30 tablet 0    No current facility-administered medications for this encounter.   ROS  Review of Systems  Genitourinary:        RLQ ileal conduit   Skin:  Positive for color change and wound.       Serum filled blister  Psychiatric/Behavioral: Negative.    All other systems reviewed and are negative.  Vital signs:  BP (!) 150/93 (BP Location: Right Arm)   Pulse 78   Temp 97.9 F (36.6 C) (Oral)   Resp 19   SpO2 99%  Exam:  Physical Exam Vitals reviewed.  Constitutional:      Appearance: He is obese.  HENT:     Mouth/Throat:     Mouth: Mucous membranes are moist.  Cardiovascular:     Rate and Rhythm:  Normal rate.  Pulmonary:     Effort: Pulmonary effort is normal.  Abdominal:     Palpations: Abdomen is soft.  Genitourinary:    Comments: Ileal conduit Hx bladder cancer and cystectomy Musculoskeletal:        General: Normal range of motion.  Skin:    General: Skin is warm and dry.     Comments: Blister to peristomal skin      Stoma type/location:  RLQ ileal conduit  Stomal assessment/size:  1 pink and moist   Peristomal assessment:  serum filled blister at 6 o'clock position at perimeter of pouching area  Described this as a result of distention of the skin beneath medical adhesive.  We discussed and practiced removing pouch using push/pull method and not pulling aggressively.  I demonstrate stoma powder and skin prep to the area.   Treatment options for stomal/peristomal skin: stoma powder and skin prep  barrier ring and 1 piece pouch  Output: clear yellow urine  Ostomy pouching: 1pc. convex Education provided:  see above    Impression/dx  Ileal conduit   Irritant contact dermatitis  Discussion  See above   Plan  See back as needed.     Visit time: 45 minutes.   Darice Cooley FNP-BC

## 2023-11-11 ENCOUNTER — Ambulatory Visit: Admitting: Family Medicine

## 2023-11-11 VITALS — BP 144/84 | HR 76 | Ht >= 80 in | Wt 298.8 lb

## 2023-11-11 DIAGNOSIS — M545 Low back pain, unspecified: Secondary | ICD-10-CM

## 2023-11-11 DIAGNOSIS — G8929 Other chronic pain: Secondary | ICD-10-CM | POA: Diagnosis not present

## 2023-11-11 LAB — POCT URINALYSIS DIP (MANUAL ENTRY)
Bilirubin, UA: NEGATIVE
Glucose, UA: NEGATIVE mg/dL
Ketones, POC UA: NEGATIVE mg/dL
Nitrite, UA: POSITIVE — AB
Protein Ur, POC: 30 mg/dL — AB
Spec Grav, UA: 1.015 (ref 1.010–1.025)
Urobilinogen, UA: 0.2 U/dL
pH, UA: 6 (ref 5.0–8.0)

## 2023-11-11 LAB — POCT UA - MICROSCOPIC ONLY

## 2023-11-11 NOTE — Patient Instructions (Signed)
 It was great to see you! Thank you for allowing me to participate in your care!  Our plans for today:   VISIT SUMMARY: You visited us  today due to intermittent abdominal and back pain that has been ongoing for over a month. We discussed your symptoms, previous evaluations, and potential causes, including osteoarthritis and musculoskeletal issues.  YOUR PLAN: Your chronic low back pain is likely due to osteoarthritis in your back. -We will order x-rays of your back to get a clearer picture of the issue. -You will be referred to physical therapy to help manage your pain. -Continue taking Tylenol  as needed for pain relief. -We discussed potential future interventions like injections if physical therapy does not provide sufficient relief. - Follow-up 6 weeks  For your urostomy bag and prior surgery: -Monitor for any new symptoms such as increased abdominal pain or fever.    Please arrive 15 minutes PRIOR to your next scheduled appointment time! If you do not, this affects OTHER patients' care.  Take care and seek immediate care sooner if you develop any concerns.   [name], MD, PGY-3 [facility] 2:24 PM 11/11/2023  [facility]

## 2023-11-11 NOTE — Progress Notes (Signed)
    SUBJECTIVE:   CHIEF COMPLAINT / HPI: possible UTI  Discussed the use of AI scribe software for clinical note transcription with the patient, who gave verbal consent to proceed.  History of Present Illness Brad [name] is a 63 year old male who presents with abdominal and back pain to rule out infection or UTI.  Abdominal and back pain - Intermittent pain in the lower abdomen and lower back for several months - Pain originates in the lower back and radiates to the lower abdomen - No association with specific activities - No burning sensation or other urinary symptoms - No pain radiating down the legs - No pain induced by twisting or pressing on the back - No current pain at the time of visit - No fever or significant changes in symptoms - Tylenol  used daily with uncertain effectiveness - Myrbetriq  and VESIcare  used without relief - Concern about potential infections, particularly urinary tract infections - Urine tests often appear abnormal due to colostomy bag - Strenuous activity involving pulling a weed eater about one month ago, suspected as a contributing factor - was told by urologist he has osteoarthritis    PERTINENT  PMH / PSH: History of bladder cancer status post urostomy and ileal conduit.  OBJECTIVE:   BP (!) 144/84   Pulse 76   Ht 6' 9 (2.057 m)   Wt 298 lb 12.8 oz (135.5 kg)   SpO2 99%   BMI 32.02 kg/m   Physical Exam General: NAD, well appearing Neuro: A&O Respiratory: normal WOB on RA, Abdomen: soft, NTTP, no rebound or guarding, urostomy bag present, no obvious stomal irregularities Back: no obvious deformity, no TTP along paraspinal muscles or lumbar spinous processes, negative straight leg raise bilaterally Extremities: Moving all 4 extremities equally, no peripheral edema    ASSESSMENT/PLAN:   Assessment & Plan Chronic bilateral low back pain without sciatica Chronic low back and abdominal pain in the setting of history of urostomy and  ileal conduit.  I doubt that the patient's current symptoms are secondary to ureteral spasm or UTI; his urine is consistent with chronic colonization.  He does not have systemic symptoms of UTI or pyelonephritis.  In the setting of yard work and additional back pain is more likely related to degenerative disc disease seen on prior CT abdomens.  Patient agreeable to plan below. - Order x-rays of the back. - Refer to physical therapy for pain management. - Advise continuation of Tylenol . - Discuss potential future interventions like injections if physical therapy fails.  Precepted with Dr. Donzetta  Return in about 6 weeks (around 12/23/2023).  Brad Provencal, MD, PGY-3 [facility] 2:40 PM 11/11/2023  [facility]

## 2023-11-12 ENCOUNTER — Other Ambulatory Visit: Payer: Self-pay | Admitting: Family Medicine

## 2023-11-12 DIAGNOSIS — G4733 Obstructive sleep apnea (adult) (pediatric): Secondary | ICD-10-CM

## 2023-11-12 NOTE — Telephone Encounter (Signed)
 Received notification in EPIC that pt did not read mychart. Called pt at [phone].   Relayed message from Amy. He would like to proceed with in lab sleep study. Aware Amy will place order. Sleep lab will work on Clinical biochemist and then will call to get him scheduled.  He will continue using old CPAP machine for now.

## 2023-11-17 ENCOUNTER — Other Ambulatory Visit: Payer: Self-pay

## 2023-11-17 ENCOUNTER — Emergency Department (HOSPITAL_COMMUNITY)

## 2023-11-17 ENCOUNTER — Emergency Department (HOSPITAL_COMMUNITY)
Admission: EM | Admit: 2023-11-17 | Discharge: 2023-11-17 | Disposition: A | Discharge status: Home / Self Care | Attending: Emergency Medicine | Admitting: Emergency Medicine

## 2023-11-17 DIAGNOSIS — R7989 Other specified abnormal findings of blood chemistry: Secondary | ICD-10-CM | POA: Insufficient documentation

## 2023-11-17 DIAGNOSIS — R1084 Generalized abdominal pain: Secondary | ICD-10-CM | POA: Diagnosis not present

## 2023-11-17 DIAGNOSIS — Z7901 Long term (current) use of anticoagulants: Secondary | ICD-10-CM | POA: Diagnosis not present

## 2023-11-17 DIAGNOSIS — R109 Unspecified abdominal pain: Secondary | ICD-10-CM | POA: Diagnosis present

## 2023-11-17 LAB — CBC WITH DIFFERENTIAL/PLATELET
Abs Immature Granulocytes: 0.02 K/uL (ref 0.00–0.07)
Basophils Absolute: 0 K/uL (ref 0.0–0.1)
Basophils Relative: 0 %
Eosinophils Absolute: 0.3 K/uL (ref 0.0–0.5)
Eosinophils Relative: 4 %
HCT: 34.7 % — ABNORMAL LOW (ref 39.0–52.0)
Hemoglobin: 11.1 g/dL — ABNORMAL LOW (ref 13.0–17.0)
Immature Granulocytes: 0 %
Lymphocytes Relative: 24 %
Lymphs Abs: 1.7 K/uL (ref 0.7–4.0)
MCH: 28.5 pg (ref 26.0–34.0)
MCHC: 32 g/dL (ref 30.0–36.0)
MCV: 89.2 fL (ref 80.0–100.0)
Monocytes Absolute: 0.6 K/uL (ref 0.1–1.0)
Monocytes Relative: 9 %
Neutro Abs: 4.4 K/uL (ref 1.7–7.7)
Neutrophils Relative %: 63 %
Platelets: 253 K/uL (ref 150–400)
RBC: 3.89 MIL/uL — ABNORMAL LOW (ref 4.22–5.81)
RDW: 13.5 % (ref 11.5–15.5)
WBC: 7 K/uL (ref 4.0–10.5)
nRBC: 0 % (ref 0.0–0.2)

## 2023-11-17 LAB — COMPREHENSIVE METABOLIC PANEL WITH GFR
ALT: 32 U/L (ref 0–44)
AST: 29 U/L (ref 15–41)
Albumin: 3.8 g/dL (ref 3.5–5.0)
Alkaline Phosphatase: 53 U/L (ref 38–126)
Anion gap: 12 (ref 5–15)
BUN: 14 mg/dL (ref 8–23)
CO2: 24 mmol/L (ref 22–32)
Calcium: 9 mg/dL (ref 8.9–10.3)
Chloride: 101 mmol/L (ref 98–111)
Creatinine, Ser: 1.43 mg/dL — ABNORMAL HIGH (ref 0.61–1.24)
GFR, Estimated: 56 mL/min — ABNORMAL LOW (ref >60)
Glucose, Bld: 99 mg/dL (ref 70–99)
Potassium: 3.9 mmol/L (ref 3.5–5.1)
Sodium: 137 mmol/L (ref 135–145)
Total Bilirubin: 0.5 mg/dL (ref 0.0–1.2)
Total Protein: 6.9 g/dL (ref 6.5–8.1)

## 2023-11-17 LAB — URINALYSIS, ROUTINE W REFLEX MICROSCOPIC
Bilirubin Urine: NEGATIVE
Glucose, UA: NEGATIVE mg/dL
Ketones, ur: NEGATIVE mg/dL
Nitrite: POSITIVE — AB
Protein, ur: NEGATIVE mg/dL
Specific Gravity, Urine: 1.01 (ref 1.005–1.030)
pH: 6 (ref 5.0–8.0)

## 2023-11-17 LAB — I-STAT CHEM 8, ED
BUN: 16 mg/dL (ref 8–23)
Calcium, Ion: 1.23 mmol/L (ref 1.15–1.40)
Chloride: 104 mmol/L (ref 98–111)
Creatinine, Ser: 1.6 mg/dL — ABNORMAL HIGH (ref 0.61–1.24)
Glucose, Bld: 92 mg/dL (ref 70–99)
HCT: 41 % (ref 39.0–52.0)
Hemoglobin: 13.9 g/dL (ref 13.0–17.0)
Potassium: 4.2 mmol/L (ref 3.5–5.1)
Sodium: 141 mmol/L (ref 135–145)
TCO2: 25 mmol/L (ref 22–32)

## 2023-11-17 LAB — LIPASE, BLOOD: Lipase: 29 U/L (ref 11–51)

## 2023-11-17 MED ORDER — IOHEXOL 350 MG/ML SOLN
100.0000 mL | Freq: Once | INTRAVENOUS | Status: AC | PRN
  Administered 2023-11-17: 100 mL via INTRAVENOUS

## 2023-11-17 NOTE — ED Provider Notes (Signed)
 [place] EMERGENCY DEPARTMENT AT [facility] Provider Note   CSN: [id number] Arrival date & time: 11/17/23  8095     Patient presents with: Abdominal Pain and Back Pain   Brad [name] is a 63 y.o. male.   Patient complains of lower abdominal pain.  Patient reports he has a history of having pain in his low back that radiates to his side.  Patient states that he has had it happen on the left side.  Patient states 2 days ago he began having pain in his flank area that radiated to his right lower abdomen.  Patient states he has had similar in the past.  His urologist has told him that the pain was musculoskeletal.  Patient states the pain was worse today.  Patient states that he took a BC powder prior to coming to the emergency department and that has alleviated the pain.  Patient denies any nausea denies any vomiting he has not had any fever or chills.  Patient has had a cystectomy and prostatectomy.  Patient reports he has normal urine output.  Patient denies any nausea vomiting or diarrhea.  He has not had any fever or chills.  The history is provided by the patient. No language interpreter was used.  Abdominal Pain Back Pain Associated symptoms: abdominal pain        Prior to Admission medications   Medication Sig Start Date End Date Taking? Authorizing Provider  acetaminophen  (TYLENOL ) 500 MG tablet Take 500 mg by mouth every 6 (six) hours as needed for moderate pain (pain score 4-6).    [provider]  amLODipine  (NORVASC ) 10 MG tablet Take 1 tablet (10 mg total) by mouth daily. 12/15/20   Prentiss Frieze, DO  amoxicillin (AMOXIL) 500 MG capsule Take 500 mg by mouth 3 (three) times daily. Patient not taking: Reported on 09/24/2023 08/17/23   [provider]  apixaban  (ELIQUIS ) 5 MG TABS tablet TAKE 1 TABLET(5 MG) BY MOUTH TWICE DAILY 09/12/23   Janna Ferrier, DO  blood glucose meter kit and supplies KIT Dispense based on patient and insurance  preference. Check fasting blood sugar once daily ICD10 R73.03 Patient not taking: Reported on 09/24/2023 10/11/16   Bari Theodoro FALCON, MD  Cholecalciferol (VITAMIN D3 MAXIMUM STRENGTH) 125 MCG (5000 UT) capsule Take 5,000 Units by mouth daily.    [provider]  diclofenac  Sodium (VOLTAREN ) 1 % GEL Apply 4 g topically 4 (four) times daily as needed (pain). Patient not taking: Reported on 09/24/2023 06/04/23   Elicia Hamlet, MD  gabapentin  (NEURONTIN ) 100 MG capsule Take 2 tablets at bedtime (200 mg) 08/15/23   [name] Pauletta BROCKS, MD  glucose blood test strip Use as instructed to monitor FSBS 1x daily. Dx: R73.09 Patient not taking: Reported on 09/24/2023 06/07/17   Bari Theodoro FALCON, MD  ibuprofen  (ADVIL ) 800 MG tablet Take 1 tablet (800 mg total) by mouth every 8 (eight) hours as needed. 09/24/23   Gomes, Adriana, DO  labetalol  (NORMODYNE ) 200 MG tablet TAKE 1 TABLET(200 MG) BY MOUTH TWICE DAILY 09/13/23   Janna Ferrier, DO  Lancets [facility] ULTRASOFT) lancets Use as instructed Patient not taking: Reported on 09/24/2023 10/11/16   Bari Theodoro FALCON, MD  mirabegron  ER (MYRBETRIQ ) 25 MG TB24 tablet Take 1 tablet (25 mg total) by mouth daily. 09/12/23   Alba Sharper, MD  Multiple Vitamins-Minerals (ONE-A-DAY MENS 50+ ADVANTAGE) TABS Take 1 tablet by mouth daily.    [provider]  pantoprazole  (PROTONIX ) 40 MG tablet  Take 1 tablet (40 mg total) by mouth 2 (two) times daily. Protonix  40 mg twice daily for 6 weeks, then reduce to 40 mg daily. 09/16/23   Cirigliano, Vito V, DO  solifenacin  (VESICARE ) 5 MG tablet Take 1 tablet (5 mg total) by mouth daily. 09/02/23   Alba Sharper, MD  tirzepatide  (MOUNJARO ) 12.5 MG/0.5ML Pen ADMINISTER 12.5 MG UNDER THE SKIN 1 TIME WEEKLY 11/12/23   Cleotilde Lukes, DO    Allergies: Patient has no known allergies.    Review of Systems  Gastrointestinal:  Positive for abdominal pain.  Musculoskeletal:  Positive for back pain.  All other systems reviewed  and are negative.   Updated Vital Signs BP (!) 155/86   Pulse 69   Temp 98.4 F (36.9 C) (Oral)   Resp 14   SpO2 100%   Physical Exam Vitals reviewed.  Constitutional:      Appearance: He is well-developed.  HENT:     Head: Normocephalic and atraumatic.  Cardiovascular:     Rate and Rhythm: Normal rate.     Heart sounds: Normal heart sounds.  Pulmonary:     Effort: Pulmonary effort is normal.  Abdominal:     General: Abdomen is flat. Bowel sounds are normal.     Palpations: Abdomen is soft.     Tenderness: There is no abdominal tenderness.     Hernia: No hernia is present.  Skin:    General: Skin is warm.  Neurological:     General: No focal deficit present.     Mental Status: He is alert.     (all labs ordered are listed, but only abnormal results are displayed) Labs Reviewed  COMPREHENSIVE METABOLIC PANEL WITH GFR - Abnormal; Notable for the following components:      Result Value   Creatinine, Ser 1.43 (*)    GFR, Estimated 56 (*)    All other components within normal limits  CBC WITH DIFFERENTIAL/PLATELET - Abnormal; Notable for the following components:   RBC 3.89 (*)    Hemoglobin 11.1 (*)    HCT 34.7 (*)    All other components within normal limits  URINALYSIS, ROUTINE W REFLEX MICROSCOPIC - Abnormal; Notable for the following components:   APPearance HAZY (*)    Hgb urine dipstick SMALL (*)    Nitrite POSITIVE (*)    Leukocytes,Ua SMALL (*)    Bacteria, UA MANY (*)    All other components within normal limits  I-STAT CHEM 8, ED - Abnormal; Notable for the following components:   Creatinine, Ser 1.60 (*)    All other components within normal limits  LIPASE, BLOOD    EKG: None  Radiology: CT L-SPINE NO CHARGE Result Date: 11/17/2023 EXAM: CT OF THE LUMBAR SPINE WITHOUT CONTRAST 11/17/2023 09:09:40 PM TECHNIQUE: CT of the lumbar spine was performed without the administration of intravenous contrast. Multiplanar reformatted images are provided for  review. Automated exposure control, iterative reconstruction, and/or weight based adjustment of the mA/kV was utilized to reduce the radiation dose to as low as reasonably achievable. COMPARISON: CT abdomen and pelvis 08/13/2023, CT abdomen and pelvis 11/17/2023. CLINICAL HISTORY: FINDINGS: BONES AND ALIGNMENT: Normal vertebral body heights. No acute fracture or suspicious bone lesion. Normal alignment. DEGENERATIVE CHANGES: Multilevel moderate degenerative changes of the spine. No associated severe osseous or foraminal or central canal stenosis. SOFT TISSUES: No acute abnormality. IMPRESSION: 1. No acute abnormality identified in the lumbar spine. Electronically signed by: Morgane Naveau MD 11/17/2023 09:20 PM EDT RP Workstation: [id number]  CT ABDOMEN PELVIS W CONTRAST Result Date: 11/17/2023 EXAM: CT ABDOMEN AND PELVIS WITH CONTRAST 11/17/2023 09:09:40 PM TECHNIQUE: CT of the abdomen and pelvis was performed with the administration of 100 mL of iohexol  (OMNIPAQUE ) 350 MG/ML injection. Multiplanar reformatted images are provided for review. Automated exposure control, iterative reconstruction, and/or weight-based adjustment of the mA/kV was utilized to reduce the radiation dose to as low as reasonably achievable. COMPARISON: CT abdomen and pelvis 08/13/2023. CLINICAL HISTORY: Abdominal pain, acute, nonlocalized. FINDINGS: LOWER CHEST: No acute abnormality. LIVER: Nonspecific subcentimeter hypodensities within the hepatic lobe may represent simple cyst or hemangiomas. GALLBLADDER AND BILE DUCTS: Gallbladder is unremarkable. No biliary ductal dilatation. SPLEEN: No acute abnormality. PANCREAS: No acute abnormality. ADRENAL GLANDS: No acute abnormality. KIDNEYS, URETERS AND BLADDER: Fluid density lesions within the kidneys likely represent simple renal cysts. Simple renal cysts do not require additional follow-up unless clinically indicated due to signs/symptoms. No stones in the kidneys or ureters. No  hydronephrosis. No perinephric or periureteral stranding. Status post cystectomy status post right ileal conduit formation. GI AND BOWEL: Ileocolic anastomosis noted. The appendix is not definitely identified with no inflammatory changes in the right lower quadrant to suggest acute appendicitis. Stomach demonstrates no acute abnormality. There is no bowel obstruction. PERITONEUM AND RETROPERITONEUM: No ascites. No free air. VASCULATURE: Aorta is normal in caliber. Moderate atherosclerotic plaque. LYMPH NODES: No lymphadenopathy. REPRODUCTIVE ORGANS: Prostatectomy. BONES AND SOFT TISSUES: No acute osseous abnormality. No focal soft tissue abnormality. IMPRESSION: 1. No acute findings in the abdomen or pelvis. 2. Status post cystoscopy and prostatectomy status post right ileal conduit formation. Electronically signed by: Morgane Naveau MD 11/17/2023 09:19 PM EDT RP Workstation: [id number]     Procedures   Medications Ordered in the ED  iohexol  (OMNIPAQUE ) 350 MG/ML injection 100 mL (100 mLs Intravenous Contrast Given 11/17/23 2109)                                    Medical Decision Making Patient complains of right lower quadrant abdominal pain that has completely resolved.  Patient states he took a BC powder earlier today and this resolved with the pain.  Patient has had similar pain in the past.  He reports the urologist told him it was musculoskeletal  Amount and/or Complexity of Data Reviewed Labs: ordered. Decision-making details documented in ED Course.    Details: Labs ordered reviewed and interpreted.  CBC shows normal Rappaport count.  Patient has a slightly elevated creatinine.  Creatinine and GFR are at baseline. Radiology: ordered and independent interpretation performed. Decision-making details documented in ED Course.    Details: CT abdomen and pelvis ordered reviewed and interpreted CT chest scan shows renal cyst.  CT LS spine shows degenerative changes no acute findings  Risk Risk  Details: Patient complains of abdominal pain earlier today.  Patient reports that the pain has completely resolved after taking a BC powder.  Patient counseled on not taking aspirin while he is on a blood thinner.  Patient is advised Tylenol  for discomfort.  Patient is advised to follow-up with his primary care physician for recheck.        Final diagnoses:  Generalized abdominal pain    ED Discharge Orders     None          Christelle Igoe K, PA-C 11/17/23 2246    Patt Alm Macho, MD 11/17/23 2300

## 2023-11-17 NOTE — Discharge Instructions (Signed)
Return if any problems. Tylenol for discomfort

## 2023-11-17 NOTE — ED Triage Notes (Signed)
 X2 days of burning sensation across lower back that radiates into lower abdomen

## 2023-11-17 NOTE — ED Provider Triage Note (Signed)
 Emergency Medicine Provider Triage Evaluation Note  Demond Xzavian Semmel , a 63 y.o. male  was evaluated in triage.  Pt complains of low left sided back pain radiating towards right back and then around front to RLQ and groin. Patient wants to make sure that there is no infection. Urologist thinks its MSK in nature. Patient has taken BC powder around 2 hours PTA which did provide some relief.   Denies nausea, vomiting, diarrhea, hematochezia. Patient with urostomy from prior bladder/prostate cancer.   Review of Systems  Positive:  Negative:   Physical Exam  BP (!) 156/83 (BP Location: Right Arm)   Pulse 73   Temp 98.1 F (36.7 C) (Oral)   Resp 18   SpO2 96%  Gen:   Awake, no distress   Resp:  Normal effort  MSK:   Moves extremities without difficulty  Other:    Medical Decision Making  Medically screening exam initiated at 7:20 PM.  Appropriate orders placed.  Dimitri Safal Halderman was informed that the remainder of the evaluation will be completed by another provider, this initial triage assessment does not replace that evaluation, and the importance of remaining in the ED until their evaluation is complete.     Hoy Nidia FALCON, [place] 11/17/23 [place]

## 2023-11-18 ENCOUNTER — Other Ambulatory Visit: Payer: Self-pay | Admitting: *Deleted

## 2023-11-19 ENCOUNTER — Telehealth: Payer: Self-pay | Admitting: Family Medicine

## 2023-11-19 NOTE — Telephone Encounter (Signed)
 [place] [employer] [place]are shara: [id number] (exp. 11/19/23 to 05/16/24)

## 2023-11-19 NOTE — Telephone Encounter (Signed)
 [place] [employer] [place] pending

## 2023-11-20 LAB — URINE CULTURE
Culture: >=100000 — AB
Special Requests: NORMAL

## 2023-11-21 ENCOUNTER — Inpatient Hospital Stay

## 2023-11-21 ENCOUNTER — Telehealth (HOSPITAL_BASED_OUTPATIENT_CLINIC_OR_DEPARTMENT_OTHER): Payer: Self-pay

## 2023-11-21 ENCOUNTER — Ambulatory Visit (HOSPITAL_COMMUNITY)

## 2023-11-21 ENCOUNTER — Encounter (HOSPITAL_COMMUNITY): Payer: Self-pay

## 2023-11-21 VITALS — BP 146/79 | HR 77 | Temp 97.2°F | Resp 17 | Ht >= 80 in | Wt 304.0 lb

## 2023-11-21 DIAGNOSIS — Z9079 Acquired absence of other genital organ(s): Secondary | ICD-10-CM | POA: Diagnosis not present

## 2023-11-21 DIAGNOSIS — C679 Malignant neoplasm of bladder, unspecified: Secondary | ICD-10-CM

## 2023-11-21 DIAGNOSIS — Z79899 Other long term (current) drug therapy: Secondary | ICD-10-CM | POA: Diagnosis not present

## 2023-11-21 DIAGNOSIS — Z9889 Other specified postprocedural states: Secondary | ICD-10-CM | POA: Diagnosis not present

## 2023-11-21 DIAGNOSIS — C678 Malignant neoplasm of overlapping sites of bladder: Secondary | ICD-10-CM | POA: Insufficient documentation

## 2023-11-21 DIAGNOSIS — G629 Polyneuropathy, unspecified: Secondary | ICD-10-CM | POA: Diagnosis not present

## 2023-11-21 DIAGNOSIS — C61 Malignant neoplasm of prostate: Secondary | ICD-10-CM | POA: Insufficient documentation

## 2023-11-21 DIAGNOSIS — I700 Atherosclerosis of aorta: Secondary | ICD-10-CM | POA: Insufficient documentation

## 2023-11-21 DIAGNOSIS — R310 Gross hematuria: Secondary | ICD-10-CM | POA: Insufficient documentation

## 2023-11-21 DIAGNOSIS — D649 Anemia, unspecified: Secondary | ICD-10-CM

## 2023-11-21 DIAGNOSIS — N400 Enlarged prostate without lower urinary tract symptoms: Secondary | ICD-10-CM | POA: Insufficient documentation

## 2023-11-21 LAB — VITAMIN B12: Vitamin B-12: 1575 pg/mL — ABNORMAL HIGH (ref 180–914)

## 2023-11-21 LAB — FERRITIN: Ferritin: 46 ng/mL (ref 24–336)

## 2023-11-21 LAB — MAGNESIUM: Magnesium: 1.9 mg/dL (ref 1.7–2.4)

## 2023-11-21 NOTE — Telephone Encounter (Signed)
 Post ED Visit - Positive Culture Follow-up  Culture report reviewed by antimicrobial stewardship pharmacist: [facility] []  Rankin Dee, Pharm.D. []  Venetia Gully, Pharm.D., BCPS AQ-ID []  Garrel Crews, Pharm.D., BCPS []  Almarie Lunger, [address].D., BCPS []  [place], [address].D., BCPS, AAHIVP []  Rosaline Bihari, Pharm.D., BCPS, AAHIVP []  Vernell Meier, PharmD, BCPS []  Latanya Hint, PharmD, BCPS []  Donald Medley, PharmD, BCPS []  Rocky Bold, PharmD []  Dorothyann Alert, PharmD, BCPS []  Morene Babe, PharmD X   Powell Blush, Pharm.D.  [facility] []  Rosaline Edison, PharmD []  Romona Bliss, PharmD []  Dolphus Roller, PharmD []  Veva Seip, Rph []  Vernell Daunt) Leonce, PharmD []  Eva Allis, PharmD []  Rosaline Millet, PharmD []  Iantha Batch, PharmD []  Arvin Gauss, PharmD []  Wanda Hasting, PharmD []  Ronal Rav, PharmD []  Rocky Slade, PharmD []  Bard Jeans, PharmD .  Positive urine culture Treated with no abx  Chart reviewed by Dr Greig Buttery DO No f/u asymptomatic bacturia  Gretta Avelina Buss 11/21/2023, 11:19 AM

## 2023-11-21 NOTE — Progress Notes (Signed)
 Brad [name] OFFICE PROGRESS NOTE  Patient Care Team: Brad Ferrier, DO as PCP - General (Family Medicine) Brad Oneil BROCKS, MD as PCP - Cardiology (Cardiology) [name], [facility] Brad Brad BROCKS, [name]  Brad [name] is a 63 y.o.male with history of hypertension, CVA, type 2 diabetes, OSA being [facility] for muscle invasive bladder cancer.   Diagnosis: MIBC Treatment: neoadjuvant ddMVAC x4 10-12/2022 03/29/23 Cystoprostatectomy and bilateral pelvic lymph node dissection, ileal conduit urinary diversion. pCR, ypT0 Incidental prostatic adenocarcinoma, Gleason score 3+3=6 (grade group 1) completed resected   Recent CT did not show any signs of metastases. Assessment & Plan Malignant neoplasm of urinary bladder, unspecified site [facility]) S/p Neoadjuvant ddMVAC. Tolerated well S/p cystoprostatectomy. ypT0. pCR CT CAP in end of Feb CBC, CMP in Feb with return for CT scan Follow up with me in early March History of ileal conduit B12 1000 mcg daily Repeat with next visit in about 3 months  Orders Placed This Encounter  Procedures   CT CHEST ABDOMEN PELVIS W CONTRAST    Standing Status:   Future    Expected Date:   03/23/2024    Expiration Date:   11/20/2024    If indicated for the ordered procedure, I authorize the administration of contrast media per Radiology protocol:   Yes    Does the patient have a contrast media/X-ray dye allergy?:   No    Preferred imaging location?:   [name]    If indicated for the ordered procedure, I authorize the administration of oral contrast media per Radiology protocol:   Yes   CMP (Cancer [name] only)    Standing Status:   Future    Expiration Date:   11/20/2024   CBC with Differential (Cancer [name] Only)    Standing Status:   Future    Expiration Date:   11/20/2024     Brad [name] [name], MD  INTERVAL HISTORY: Patient returns for follow-up. Overall feeling  well. No coughing, short of breath, abdominal pain, diarrhea. Urine output is fine and drinking enough fluid. Neuropathy at the feet about the same.   Oncology History  Bladder cancer (HCC)  08/28/2022 Imaging   1. Contrast enhancing, lobulated endoluminal mass of the anterior bladder dome, measuring 2.6 x 2.0 x 1.2 cm, consistent with primary bladder malignancy. 2. Prominent varices about the bladder dome and anterior bladder, without overt evidence of extramural tumor extent. 3. No evidence of lymphadenopathy or metastatic disease in the abdomen or pelvis. 4. Prostatomegaly.   09/11/2022 Initial Diagnosis   Bladder cancer [name]) At age 5 year old found to have a solid enhancing 2.5 cm anterior bladder dome mass on CT hematuria protocol on 08/28/2022 during evaluation for gross hematuria.    09/26/2022 Pathology Results   A. BLADDER TUMOR, SUPERFICIAL MARGIN DOME, TURBT:  High grade papillary urothelial carcinoma with inverted growth pattern  The carcinoma invades muscularis propria   B. BLADDER TUMOR, DEEP MARGIN DOME, TURBT:  Invasive high grade papillary urothelial carcinoma  Muscularis propria (detrusor muscle) is present and not involved by  carcinoma   C. BLADDER TUMOR, INFERIOR MARGIN, BIOPSY:  Submucosa and muscularis propria is present and not involved by  carcinoma    09/26/2022 Procedure   1.  Cystoscopy with TURBT (medium) 2.  Intravesical instillation of gemcitabine  3.  Cystogram with intraoperative interpretation of fluoroscopic imaging   11/22/2022 - 01/07/2023 Chemotherapy   Patient is on Treatment Plan : BLADDER DOSE DENSE MVAC  q14d     01/29/2023 Imaging   CT CAP: IMPRESSION: 1. Interval resection of the previously visualized bladder mass without evidence of local recurrence given limited distension. 2. No evidence of metastatic disease within the chest, abdomen or pelvis. 3.  Aortic Atherosclerosis (ICD10-I70.0).   03/07/2023 Cancer Staging   Staging form:  Urinary Bladder, AJCC 8th Edition - Clinical: Stage II (cT2, cN0, cM0) - Signed by Brad Brad BROCKS, MD on 03/07/2023 Stage prefix: Initial diagnosis WHO/ISUP grade (low/high): High Grade Histologic grading system: 2 grade system   03/29/2023 Pathology Results   CYSTOPROSTATECTOMY and bilateral pelvic lymph node dissection, ileal conduit urinary diversion   No residual carcinoma identified (ypT0)  All margins of resection are negative for carcinoma   Small focus of prostatic adenocarcinoma, Gleason score 3+3=6 (grade group 1)  Tumor is organ confined (ypT2 ypN0)  Seminal vesicles, vasa deferentia and all margins of resection are  negative for tumor         PHYSICAL EXAMINATION: ECOG PERFORMANCE STATUS: 0 - Asymptomatic  Vitals:   11/21/23 1338  BP: (!) 146/79  Pulse: 77  Resp: 17  Temp: (!) 97.2 F (36.2 C)  SpO2: 100%   Filed Weights   11/21/23 1338  Weight: (!) 304 lb (137.9 kg)    GENERAL: alert, no distress and comfortable SKIN: skin color normal and no jaundice NECK: No palpable mass LYMPH:  no palpable cervical, axillary lymphadenopathy  LUNGS: clear to auscultation and no wheeze or rales with normal breathing effort HEART: regular rate & rhythm  ABDOMEN: abdomen soft, non-tender and nondistended. Musculoskeletal: no edema   Relevant data reviewed during this visit included labs.  New labs ordered.

## 2023-11-21 NOTE — Assessment & Plan Note (Addendum)
 S/p Neoadjuvant ddMVAC. Tolerated well S/p cystoprostatectomy. ypT0. pCR CT CAP in end of Feb CBC, CMP in Feb with return for CT scan Follow up with me in early March

## 2023-11-21 NOTE — Progress Notes (Signed)
 ED Antimicrobial Stewardship Positive Culture Follow Up   Brad [name] is an 63 y.o. male who presented to [facility] with a chief complaint of  Chief Complaint  Patient presents with   Abdominal Pain   Back Pain    Recent Results (from the past 720 hours)  Urine Culture     Status: Abnormal   Collection Time: 11/17/23  8:21 PM   Specimen: Urine, Suprapubic  Result Value Ref Range Status   Specimen Description URINE, SUPRAPUBIC  Final   Special Requests   Final    Normal Performed at [facility], [address], [place], [place] [address]    Culture >=100,000 COLONIES/mL KLEBSIELLA PNEUMONIAE (A)  Final   Report Status 11/20/2023 FINAL  Final   Organism ID, Bacteria KLEBSIELLA PNEUMONIAE (A)  Final      Susceptibility   Klebsiella pneumoniae - MIC*    AMPICILLIN >=32 RESISTANT Resistant     CEFAZOLIN  (URINE) Value in next row Sensitive      2 SENSITIVEThis is a modified FDA-approved test that has been validated and its performance characteristics determined by the reporting laboratory.  This laboratory is certified under the Clinical Laboratory Improvement Amendments CLIA as qualified to perform high complexity clinical laboratory testing.    CEFEPIME Value in next row Sensitive      2 SENSITIVEThis is a modified FDA-approved test that has been validated and its performance characteristics determined by the reporting laboratory.  This laboratory is certified under the Clinical Laboratory Improvement Amendments CLIA as qualified to perform high complexity clinical laboratory testing.    ERTAPENEM Value in next row Sensitive      2 SENSITIVEThis is a modified FDA-approved test that has been validated and its performance characteristics determined by the reporting laboratory.  This laboratory is certified under the Clinical Laboratory Improvement Amendments CLIA as qualified to perform high complexity clinical laboratory testing.    CEFTRIAXONE  Value in next row Sensitive       2 SENSITIVEThis is a modified FDA-approved test that has been validated and its performance characteristics determined by the reporting laboratory.  This laboratory is certified under the Clinical Laboratory Improvement Amendments CLIA as qualified to perform high complexity clinical laboratory testing.    CIPROFLOXACIN Value in next row Sensitive      2 SENSITIVEThis is a modified FDA-approved test that has been validated and its performance characteristics determined by the reporting laboratory.  This laboratory is certified under the Clinical Laboratory Improvement Amendments CLIA as qualified to perform high complexity clinical laboratory testing.    GENTAMICIN Value in next row Sensitive      2 SENSITIVEThis is a modified FDA-approved test that has been validated and its performance characteristics determined by the reporting laboratory.  This laboratory is certified under the Clinical Laboratory Improvement Amendments CLIA as qualified to perform high complexity clinical laboratory testing.    NITROFURANTOIN Value in next row Intermediate      2 SENSITIVEThis is a modified FDA-approved test that has been validated and its performance characteristics determined by the reporting laboratory.  This laboratory is certified under the Clinical Laboratory Improvement Amendments CLIA as qualified to perform high complexity clinical laboratory testing.    TRIMETH /SULFA  Value in next row Sensitive      2 SENSITIVEThis is a modified FDA-approved test that has been validated and its performance characteristics determined by the reporting laboratory.  This laboratory is certified under the Clinical Laboratory Improvement Amendments CLIA as qualified to perform high complexity  clinical laboratory testing.    AMPICILLIN/SULBACTAM Value in next row Sensitive      2 SENSITIVEThis is a modified FDA-approved test that has been validated and its performance characteristics determined by the reporting laboratory.  This  laboratory is certified under the Clinical Laboratory Improvement Amendments CLIA as qualified to perform high complexity clinical laboratory testing.    PIP/TAZO Value in next row Sensitive      <=4 SENSITIVEThis is a modified FDA-approved test that has been validated and its performance characteristics determined by the reporting laboratory.  This laboratory is certified under the Clinical Laboratory Improvement Amendments CLIA as qualified to perform high complexity clinical laboratory testing.    MEROPENEM Value in next row Sensitive      <=4 SENSITIVEThis is a modified FDA-approved test that has been validated and its performance characteristics determined by the reporting laboratory.  This laboratory is certified under the Clinical Laboratory Improvement Amendments CLIA as qualified to perform high complexity clinical laboratory testing.    * >=100,000 COLONIES/mL KLEBSIELLA PNEUMONIAE    No treatment needed, asymptomatic bacteruria.    ED Provider: Greig Buttery, MD   Powell Blush, PharmD, BCCCP  11/21/2023, 9:45 AM Clinical Pharmacist Monday - Friday phone -  [phone] Saturday - Sunday phone - [phone]

## 2023-11-21 NOTE — Assessment & Plan Note (Addendum)
 B12 1000 mcg daily Repeat with next visit in about 3 months

## 2023-11-22 ENCOUNTER — Ambulatory Visit: Payer: Self-pay

## 2023-11-29 ENCOUNTER — Other Ambulatory Visit: Payer: Self-pay

## 2023-11-29 DIAGNOSIS — E1159 Type 2 diabetes mellitus with other circulatory complications: Secondary | ICD-10-CM

## 2023-11-29 MED ORDER — AMLODIPINE BESYLATE 10 MG PO TABS: 10.0000 mg | ORAL_TABLET | Freq: Every day | ORAL | 3 refills | Status: AC

## 2023-12-04 NOTE — Therapy (Signed)
 OUTPATIENT PHYSICAL THERAPY EVALUATION   Patient Name: Brad [name] MRN: [id number] DOB:Aug 07, 1960, 63 y.o., male Today's Date: 12/05/2023  END OF SESSION:  PT End of Session - 12/05/23 0843     Visit Number 1    Number of Visits 8    Date for Recertification  01/30/24    Authorization Type BCBS Medicare - ded, oop met    Progress Note Due on Visit 8    PT Start Time 0848    PT Stop Time 0923    PT Time Calculation (min) 35 min    Activity Tolerance Patient tolerated treatment well    Behavior During Therapy [facility] for tasks assessed/performed          Past Medical History:  Diagnosis Date   Abnormal ultrasound of carotid artery 04/2014   no significant obstruction   Ataxia 04/2014   Atrial fibrillation (HCC)    Cancer (HCC)    Diabetes (HCC)    EKG abnormalities 04/2014   poor R wave progression, NSR   Former smoker    25 years x 1.5 ppd, stopped 04/2014   Gait disturbance 04/2014   s/p stroke   H/O echocardiogram 04/2014   moderate LVH, 60-65% EF, no valve disease   History of heart attack    Hypertension    Hypertensive heart disease 04/2014   Intraparenchymal hemorrhage of brain [facility]) 05/05/2014   hospitalization [facility]   Myocardial infarction [facility])    Obesity    Prediabetes    Short-term memory loss 04/2014   Sleep apnea    Stroke (HCC) 04/2014   Past Surgical History:  Procedure Laterality Date   APPENDECTOMY     CHOLECYSTECTOMY     CIRCUMCISION     CYSTOSCOPY N/A 09/26/2022   Procedure: CYSTOSCOPY;  Surgeon: Devere Lonni Righter, MD;  Location: [facility];  Service: Urology;  Laterality: N/A;  60 MINUTES   HEMORRHOID SURGERY     IR IMAGING GUIDED PORT INSERTION  11/08/2022   IR REMOVAL TUN ACCESS W/ PORT W/O FL MOD SED  07/01/2023   ROBOT LAP RADICAL CYSTOPROSTATECTOMY PELVIC LYMPHADENECTOMY, NEOBLADDER N/A 03/29/2023   Procedure: CYSTOSCOPY WITH ICG/ROBOT ASSISTED LAPAROSCOPIC RADICAL CYSTOPROSTATECTOMY;  Surgeon: Alvaro Ricardo KATHEE Mickey., MD;  Location: [facility];  Service: Urology;  Laterality: N/A;   Patient Active Problem List   Diagnosis Date Noted   Near syncope 06/01/2023   Atrial fibrillation (HCC) 05/31/2023   Chronic health problem 05/31/2023   Anemia 05/31/2023   History of urostomy 05/31/2023   Elevated d-dimer 05/31/2023   Complication of Ileal conduit 05/03/2023   History of prostate cancer 04/26/2023   History of ileal conduit 04/26/2023   Irritant contact dermatitis associated with urinary stoma 04/11/2023   Attention to urostomy [facility]) 04/11/2023   Surgical counseling visit 03/25/2023   Thrombocytopenia 01/03/2023   Hypomagnesemia 01/03/2023   Hyperglycemia 01/03/2023   Normocytic anemia 11/22/2022   At risk for side effect of medication 11/21/2022   Bladder cancer (HCC) 09/11/2022   Vitamin D  deficiency 06/10/2020   Type 2 diabetes mellitus (HCC) 10/01/2014   OSA (obstructive sleep apnea) 09/27/2014   Left-sided weakness 09/27/2014   History of stroke 08/13/2014   Former smoker 08/13/2014   Erectile dysfunction 08/13/2014   Obesity 06/20/2014   Essential hypertension 05/20/2014   NSTEMI (non-ST elevated myocardial infarction) (HCC) 05/08/2014    PCP: Janna Ferrier DO  REFERRING PROVIDER: Orie Milda CROME, MD  REFERRING DIAG: Diagnosis M54.50,G89.29 (ICD-10-CM) - Chronic bilateral low back pain  without sciatica  Rationale for Evaluation and Treatment: Rehabilitation  THERAPY DIAG:  Other low back pain - Plan: PT plan of care cert/re-cert  Difficulty in walking, not elsewhere classified - Plan: PT plan of care cert/re-cert  ONSET DATE: May 2025.   SUBJECTIVE:                                                                                                                                                                                           SUBJECTIVE STATEMENT: Pt indicated having 2 falls in last 6 months.  Pt indicated recent bladder and prostate surgery in Jan and a fall was  in May 2025.  Pt. Indicated having complaints in back, on Lt side.  Pt indicated having some complaints in trying to get to sleep due to symptoms.    Pt indicated noticing stiff in morning.   Pt denied radicular symptoms.   Reported going to the gym for cardio based activity.   PERTINENT HISTORY:  Medical history:  Atrial fib? , ataxia 2016, History of cancer, DM, heart attack, HTN, Stroke 2016.   PAIN:  NPRS scale: at worst 5 /10, current upon arrival 0/10 Pain location: back Lt but also Rt at times.  Pain description: stiff, dull Aggravating factors: morning stiffness, difficulty sleeping.  Stiffness in morning impacts transfers /ambulation.  Relieving factors: tylenol , getting moving in the morning.   PRECAUTIONS: None  WEIGHT BEARING RESTRICTIONS: No  FALLS:  Has patient fallen in last 6 months? 2 falls in last 6 months.  Reported no fear of falling.   LIVING ENVIRONMENT: Stairs: has stairs at home, no trouble.   OCCUPATION: Retired  PLOF: Independent, gym for cardio, yard work  PATIENT GOALS: Reduce pain complaints   OBJECTIVE:   PATIENT SURVEYS:  Patient-Specific Activity Scoring Scheme  0 represents "unable to perform." 10 represents "able to perform at prior level. 0 1 2 3 4 5 6 7 8 9  10 (Date and Score)   Activity Eval  12/05/2023    1. Sleeping  6    2. Getting up and moving in morning 6     3.     4.    5.    Score 6 avg    Total score = sum of the activity scores/number of activities Minimum detectable change (90%CI) for average score = 2 points Minimum detectable change (90%CI) for single activity score = 3 points  SCREENING FOR RED FLAGS: 12/05/2023 Bowel or bladder incontinence: No Cauda equina syndrome: No  COGNITION: 12/05/2023 Overall cognitive status: WFL normal      SENSATION: 12/05/2023 [facility]  MUSCLE LENGTH: 12/05/2023 Passive SLR hamstring >  75 deg bilaterally, no back pain  POSTURE:  12/05/2023 No Significant postural  limitations, no lateral shift observed.   PALPATION: 12/05/2023 No specific tenderness to touch  LUMBAR ROM:  12/05/2023 Directional Preference Assessment: Centralization: not observed Peripheralization: not observed  AROM Eval 12/05/2023  Flexion To toes without complaints  Extension 75% WFL with tightness  Repeated in standing x 5 improved to 100 %  Right lateral flexion   Left lateral flexion   Right rotation   Left rotation    (Blank rows = not tested)  LOWER EXTREMITY ROM:      Right Eval 12/05/2023 Left Eval 12/05/2023  Hip flexion    Hip extension    Hip abduction    Hip adduction    Hip internal rotation    Hip external rotation    Knee flexion    Knee extension    Ankle dorsiflexion    Ankle plantarflexion    Ankle inversion    Ankle eversion     (Blank rows = not tested)  LOWER EXTREMITY MMT:    MMT Right Eval 12/05/2023 Left Eval 12/05/2023  Hip flexion 5/5 5/5  Hip extension    Hip abduction    Hip adduction    Hip internal rotation    Hip external rotation    Knee flexion 5/5 5/5  Knee extension 5/5 5/5  Ankle dorsiflexion 5/5 5/5  Ankle plantarflexion    Ankle inversion    Ankle eversion     (Blank rows = not tested)  LUMBAR SPECIAL TESTS:  12/05/2023 (-) slump bilateral, crossed SLR  FUNCTIONAL TESTS:  12/05/2023 Sit to stand sit 18 inch chair transfer s UE assist (balance assessment/screen)  GAIT: 12/05/2023 Independent ambulation.  Difficulty walking in morning due to stiffness reported.                                                                                                                                                                                                                    TODAY'S TREATMENT:  DATE: 12/05/2023  Therex:    HEP instruction/performance c cues for techniques, handout provided.  Trial set  performed of each for comprehension and symptom assessment.  See below for exercise list  Neuro Re-ed Pain neuroscience education given regarding nervous system elevation/sensitivity increases and how that can impact sensation and response to activity. Detailed importance of graded exercise (HEP, aerobic exercise options) and mobility during the day to promote reduced sensitivity.  Question and answer time given for improved understanding.   Discussed trends related to arthritis development (hereditary, life experience impacts).     PATIENT EDUCATION:  12/05/2023 Education details: HEP, POC Person educated: Patient Education method: Programmer, Multimedia, Demonstration, Verbal cues, and Handouts Education comprehension: verbalized understanding, returned demonstration, and verbal cues required  HOME EXERCISE PROGRAM: Access Code: [email or web address] URL: [email or web address] Date: 12/05/2023 Prepared by: Ozell Silvan  Exercises - Standing Lumbar Extension  - 2-4 x daily - 7 x weekly - 1 sets - 5-10 reps - Supine Bridge  - 2-4 x daily - 7 x weekly - 1-2 sets - 10 reps - 2 hold - Supine Lower Trunk Rotation  - 2-4 x daily - 7 x weekly - 1 sets - 3-5 reps - 15 hold  ASSESSMENT:  CLINICAL IMPRESSION: Patient is a 63 y.o. who comes to clinic with complaints of back pain  with mobility deficits that impair their ability to perform usual daily and recreational functional activities without increase difficulty/symptoms at this time.  Patient to benefit from skilled PT services to address impairments and limitations to improve to previous level of function without restriction secondary to condition.   OBJECTIVE IMPAIRMENTS: decreased mobility, decreased ROM, hypomobility, impaired perceived functional ability, increased muscle spasms, impaired flexibility, improper body mechanics, and pain.   ACTIVITY LIMITATIONS: lifting, stairs, transfers, and locomotion level  PARTICIPATION LIMITATIONS:  interpersonal relationship and community activity  PERSONAL FACTORS: Medical history:  Atrial fib, ataxia 2016, History of cancer, DM, heart attack, HTN, Stroke 2016.  are also affecting patient's functional outcome.   REHAB POTENTIAL: Good  CLINICAL DECISION MAKING: Stable/uncomplicated  EVALUATION COMPLEXITY: Low   GOALS: Goals reviewed with patient? Yes  SHORT TERM GOALS: (target date for Short term goals are 3 weeks 12/26/2023)  1. Patient will demonstrate independent use of home exercise program to maintain progress from in clinic treatments.  Goal status: New  LONG TERM GOALS: (target dates for all long term goals are 8 weeks  01/30/2024 )   1. Patient will demonstrate/report pain at worst less than or equal to 2/10 to facilitate minimal limitation in daily activity secondary to pain symptoms.  Goal status: New   2. Patient will demonstrate independent use of home exercise program to facilitate ability to maintain/progress functional gains from skilled physical therapy services.  Goal status: New   3. Patient will demonstrate Patient specific functional scale avg > or = 8/10 to indicate reduced disability due to condition.   Goal status: New   4. Patient will demonstrate lumbar extension 100 % WFL s symptoms to facilitate upright standing, walking posture at PLOF s limitation.  Goal status: New    PLAN:  PT FREQUENCY: 1x/week  PT DURATION: 8 weeks  PLANNED INTERVENTIONS: Can include 02853- PT Re-evaluation, 97110-Therapeutic exercises, 97530- Therapeutic activity, [id number]- Neuromuscular re-education, 97535- Self Care, 97140- Manual therapy, [id number]- Gait training, [phone]- Orthotic Fit/training, [phone]- Canalith repositioning, [id number]- Aquatic Therapy, [phone]- Electrical stimulation (unattended), [id number] Physical performance testing, 97016- Vasopneumatic device, [id number]- Ultrasound, [id number]- Traction (mechanical), [id number]- Ionotophoresis 4mg /ml Dexamethasone ,  79439 - Needle insertion  w/o injection 1 or 2 muscles, 20561 - Needle insertion w/o injection 3 or more muscles.    Patient/Family education, Balance training, Stair training, Taping, Dry Needling, Joint mobilization, Joint manipulation, Spinal manipulation, Spinal mobilization, Scar mobilization, Vestibular training, Visual/preceptual remediation/compensation, DME instructions, Cryotherapy, and Moist heat.  All performed as medically necessary.  All included unless contraindicated  PLAN FOR NEXT SESSION: Review HEP knowledge/results.  Lumbar mobility recheck.   Ozell Silvan, PT, DPT, OCS, ATC 12/05/23  9:32 AM

## 2023-12-05 ENCOUNTER — Encounter: Payer: Self-pay | Admitting: Rehabilitative and Restorative Service Providers"

## 2023-12-05 ENCOUNTER — Ambulatory Visit: Admitting: Rehabilitative and Restorative Service Providers"

## 2023-12-05 DIAGNOSIS — M5459 Other low back pain: Secondary | ICD-10-CM

## 2023-12-05 DIAGNOSIS — R262 Difficulty in walking, not elsewhere classified: Secondary | ICD-10-CM

## 2023-12-05 NOTE — Telephone Encounter (Signed)
 [place] [employer] medicare shara: [id number] (exp. 11/19/23 to 05/16/24)  Patient is scheduled at [facility] for 01/14/24 at 9 pm.  Mailed packet and sent mychart

## 2023-12-06 ENCOUNTER — Emergency Department (HOSPITAL_COMMUNITY)
Admission: EM | Admit: 2023-12-06 | Discharge: 2023-12-06 | Disposition: A | Discharge status: Home / Self Care | Attending: Emergency Medicine | Admitting: Emergency Medicine

## 2023-12-06 ENCOUNTER — Encounter (HOSPITAL_COMMUNITY): Payer: Self-pay

## 2023-12-06 ENCOUNTER — Emergency Department (HOSPITAL_COMMUNITY)

## 2023-12-06 ENCOUNTER — Other Ambulatory Visit: Payer: Self-pay

## 2023-12-06 DIAGNOSIS — R519 Headache, unspecified: Secondary | ICD-10-CM | POA: Diagnosis present

## 2023-12-06 DIAGNOSIS — R200 Anesthesia of skin: Secondary | ICD-10-CM | POA: Insufficient documentation

## 2023-12-06 DIAGNOSIS — Z7901 Long term (current) use of anticoagulants: Secondary | ICD-10-CM | POA: Insufficient documentation

## 2023-12-06 DIAGNOSIS — Z8673 Personal history of transient ischemic attack (TIA), and cerebral infarction without residual deficits: Secondary | ICD-10-CM | POA: Insufficient documentation

## 2023-12-06 DIAGNOSIS — R42 Dizziness and giddiness: Secondary | ICD-10-CM | POA: Diagnosis not present

## 2023-12-06 LAB — CBC
HCT: 35.9 % — ABNORMAL LOW (ref 39.0–52.0)
Hemoglobin: 11.6 g/dL — ABNORMAL LOW (ref 13.0–17.0)
MCH: 29 pg (ref 26.0–34.0)
MCHC: 32.3 g/dL (ref 30.0–36.0)
MCV: 89.8 fL (ref 80.0–100.0)
Platelets: 243 K/uL (ref 150–400)
RBC: 4 MIL/uL — ABNORMAL LOW (ref 4.22–5.81)
RDW: 13.8 % (ref 11.5–15.5)
WBC: 6 K/uL (ref 4.0–10.5)
nRBC: 0 % (ref 0.0–0.2)

## 2023-12-06 LAB — URINALYSIS, ROUTINE W REFLEX MICROSCOPIC
Bilirubin Urine: NEGATIVE
Glucose, UA: NEGATIVE mg/dL
Ketones, ur: NEGATIVE mg/dL
Nitrite: POSITIVE — AB
Protein, ur: NEGATIVE mg/dL
Specific Gravity, Urine: 1.016 (ref 1.005–1.030)
WBC, UA: >50 WBC/hpf (ref 0–5)
pH: 6 (ref 5.0–8.0)

## 2023-12-06 LAB — COMPREHENSIVE METABOLIC PANEL WITH GFR
ALT: 33 U/L (ref 0–44)
AST: 27 U/L (ref 15–41)
Albumin: 3.9 g/dL (ref 3.5–5.0)
Alkaline Phosphatase: 53 U/L (ref 38–126)
Anion gap: 10 (ref 5–15)
BUN: 16 mg/dL (ref 8–23)
CO2: 25 mmol/L (ref 22–32)
Calcium: 9 mg/dL (ref 8.9–10.3)
Chloride: 104 mmol/L (ref 98–111)
Creatinine, Ser: 1.22 mg/dL (ref 0.61–1.24)
GFR, Estimated: >60 mL/min (ref >60)
Glucose, Bld: 99 mg/dL (ref 70–99)
Potassium: 4.5 mmol/L (ref 3.5–5.1)
Sodium: 139 mmol/L (ref 135–145)
Total Bilirubin: 0.8 mg/dL (ref 0.0–1.2)
Total Protein: 6.9 g/dL (ref 6.5–8.1)

## 2023-12-06 LAB — CBG MONITORING, ED: Glucose-Capillary: 102 mg/dL — ABNORMAL HIGH (ref 70–99)

## 2023-12-06 MED ORDER — PROCHLORPERAZINE MALEATE 10 MG PO TABS: 10.0000 mg | ORAL_TABLET | Freq: Three times a day (TID) | ORAL | 0 refills | Status: AC | PRN

## 2023-12-06 NOTE — ED Provider Triage Note (Signed)
 Emergency Medicine Provider Triage Evaluation Note  Brad [name] , a 63 y.o. male  was evaluated in triage.  Pt complains of neck pain and headache moved from side to side he has intermittent headaches history of cancer patient was worried he could have some issues he denies any other neurologic deficits weakness in the upper extremities loss of grip strength..  Review of Systems  Positive: Neck pain Negative: Weakness  Physical Exam  BP (!) 143/88 (BP Location: Right Arm)   Pulse 75   Temp 98.1 F (36.7 C)   Resp 18   Ht 6' 9 (2.057 m)   Wt (!) 138 kg   SpO2 97%   BMI 32.60 kg/m  Gen:   Awake, no distress   Resp:  Normal effort  MSK:   Moves extremities without difficulty  Other:   Medical Decision Making  Medically screening exam initiated at 12:42 PM.  Appropriate orders placed.  Brad [name] was informed that the remainder of the evaluation will be completed by another provider, this initial triage assessment does not replace that evaluation, and the importance of remaining in the ED until their evaluation is complete.     Arloa Chroman, PA-C 12/06/23 1328

## 2023-12-06 NOTE — ED Triage Notes (Addendum)
 Pt c/o neck pain and headache intermittently for past 4 days. Pt states he fell 5 months ago and ever since then has felt off. Pt denies, nausea, vomiting, dizziness, or tingling. Pt has intermittent tingling in left hand fingers. Pt also has ringing in ears intermittently.

## 2023-12-06 NOTE — ED Triage Notes (Signed)
 Patient states he has neck pain for the past 4 days. He states its intermittent. Denies any injury.

## 2023-12-06 NOTE — Discharge Instructions (Signed)
 As we discussed your labs and CTs were normal today  You may have a migraine  Please take Compazine  10 mg every 8 hours as needed  Follow-up with neurology  Return to ER if you have worse numbness or headache or weakness

## 2023-12-06 NOTE — ED Provider Notes (Signed)
 [place] EMERGENCY DEPARTMENT AT [facility] Provider Note   CSN: [id number] Arrival date & time: 12/06/23  1109     Patient presents with: No chief complaint on file.   Brad [name] is a 63 y.o. male history of stroke, here presenting with headache and dizziness.  Patient states that he had a fall several months ago.  He states that he just feels lightheaded dizzy sometimes.  He states that for the last several days he has some numbness of his left hand as well.  He states that he had previous stroke but this is not similar to his stroke.   The history is provided by the patient.       Prior to Admission medications   Medication Sig Start Date End Date Taking? Authorizing Provider  acetaminophen  (TYLENOL ) 500 MG tablet Take 500 mg by mouth every 6 (six) hours as needed for moderate pain (pain score 4-6).    [provider]  amLODipine  (NORVASC ) 10 MG tablet Take 1 tablet (10 mg total) by mouth daily. 11/29/23   Gomes, Adriana, DO  amoxicillin (AMOXIL) 500 MG capsule Take 500 mg by mouth 3 (three) times daily. Patient not taking: Reported on 09/24/2023 08/17/23   [provider]  apixaban  (ELIQUIS ) 5 MG TABS tablet TAKE 1 TABLET(5 MG) BY MOUTH TWICE DAILY 09/12/23   Janna Ferrier, DO  blood glucose meter kit and supplies KIT Dispense based on patient and insurance preference. Check fasting blood sugar once daily ICD10 R73.03 Patient not taking: Reported on 09/24/2023 10/11/16   Bari Theodoro FALCON, MD  Cholecalciferol (VITAMIN D3 MAXIMUM STRENGTH) 125 MCG (5000 UT) capsule Take 5,000 Units by mouth daily.    [provider]  diclofenac  Sodium (VOLTAREN ) 1 % GEL Apply 4 g topically 4 (four) times daily as needed (pain). Patient not taking: Reported on 09/24/2023 06/04/23   Elicia Hamlet, MD  gabapentin  (NEURONTIN ) 100 MG capsule Take 2 tablets at bedtime (200 mg) 08/15/23   [name] Pauletta BROCKS, MD  glucose blood test strip Use as instructed to monitor FSBS  1x daily. Dx: R73.09 Patient not taking: Reported on 09/24/2023 06/07/17   Bari Theodoro FALCON, MD  ibuprofen  (ADVIL ) 800 MG tablet Take 1 tablet (800 mg total) by mouth every 8 (eight) hours as needed. 09/24/23   Gomes, Adriana, DO  labetalol  (NORMODYNE ) 200 MG tablet TAKE 1 TABLET(200 MG) BY MOUTH TWICE DAILY 09/13/23   Janna Ferrier, DO  Lancets [facility] ULTRASOFT) lancets Use as instructed Patient not taking: Reported on 09/24/2023 10/11/16   Bari Theodoro FALCON, MD  mirabegron  ER (MYRBETRIQ ) 25 MG TB24 tablet Take 1 tablet (25 mg total) by mouth daily. 09/12/23   Alba Sharper, MD  Multiple Vitamins-Minerals (ONE-A-DAY MENS 50+ ADVANTAGE) TABS Take 1 tablet by mouth daily.    [provider]  pantoprazole  (PROTONIX ) 40 MG tablet Take 1 tablet (40 mg total) by mouth 2 (two) times daily. Protonix  40 mg twice daily for 6 weeks, then reduce to 40 mg daily. 09/16/23   Cirigliano, Vito V, DO  solifenacin  (VESICARE ) 5 MG tablet Take 1 tablet (5 mg total) by mouth daily. 09/02/23   Alba Sharper, MD  tirzepatide  (MOUNJARO ) 12.5 MG/0.5ML Pen ADMINISTER 12.5 MG UNDER THE SKIN 1 TIME WEEKLY 11/12/23   Cleotilde Lukes, DO    Allergies: Patient has no known allergies.    Review of Systems  Neurological:  Positive for dizziness and numbness.  All other systems reviewed and are negative.   Updated Vital  Signs BP (!) 166/86 (BP Location: Right Arm)   Pulse 77   Temp 98 F (36.7 C)   Resp 15   Ht 6' 9 (2.057 m)   Wt (!) 138 kg   SpO2 100%   BMI 32.60 kg/m   Physical Exam Vitals and nursing note reviewed.  Constitutional:      Appearance: Normal appearance.  HENT:     Head: Normocephalic.     Right Ear: Tympanic membrane normal.     Left Ear: Tympanic membrane normal.     Ears:     Comments: No obvious cerumen and no obvious otitis media    Nose: Nose normal.     Mouth/Throat:     Mouth: Mucous membranes are moist.  Eyes:     Extraocular Movements: Extraocular movements intact.      Pupils: Pupils are equal, round, and reactive to light.  Cardiovascular:     Rate and Rhythm: Normal rate and regular rhythm.     Pulses: Normal pulses.     Heart sounds: Normal heart sounds.  Pulmonary:     Effort: Pulmonary effort is normal.     Breath sounds: Normal breath sounds.  Abdominal:     General: Abdomen is flat.     Palpations: Abdomen is soft.  Musculoskeletal:        General: Normal range of motion.     Cervical back: Normal range of motion and neck supple.  Skin:    General: Skin is warm.     Capillary Refill: Capillary refill takes less than 2 seconds.  Neurological:     General: No focal deficit present.     Mental Status: He is alert and oriented to person, place, and time.     Comments: No obvious facial droop.  Cranial nerves II through XII intact.  Normal strength and sensation bilateral arms and legs  Psychiatric:        Mood and Affect: Mood normal.        Behavior: Behavior normal.     (all labs ordered are listed, but only abnormal results are displayed) Labs Reviewed  CBC - Abnormal; Notable for the following components:      Result Value   RBC 4.00 (*)    Hemoglobin 11.6 (*)    HCT 35.9 (*)    All other components within normal limits  URINALYSIS, ROUTINE W REFLEX MICROSCOPIC - Abnormal; Notable for the following components:   APPearance HAZY (*)    Hgb urine dipstick SMALL (*)    Nitrite POSITIVE (*)    Leukocytes,Ua SMALL (*)    Bacteria, UA RARE (*)    All other components within normal limits  CBG MONITORING, ED - Abnormal; Notable for the following components:   Glucose-Capillary 102 (*)    All other components within normal limits  COMPREHENSIVE METABOLIC PANEL WITH GFR    EKG: EKG Interpretation Date/Time:  Friday December 06 2023 15:15:17 EST Ventricular Rate:  80 PR Interval:  160 QRS Duration:  90 QT Interval:  382 QTC Calculation: 440 R Axis:   200  Text Interpretation: Normal sinus rhythm Right superior axis  deviation Pulmonary disease pattern Abnormal ECG When compared with ECG of 11-Jun-2023 12:03, PREVIOUS ECG IS PRESENT Confirmed by Patt Alm DEL [phone]) on 12/06/2023 3:17:27 PM  Radiology: CT Cervical Spine Wo Contrast Result Date: 12/06/2023 EXAM: CT CERVICAL SPINE WITHOUT CONTRAST 12/06/2023 01:49:00 PM TECHNIQUE: CT of the cervical spine was performed without the administration of intravenous contrast. Multiplanar reformatted  images are provided for review. Automated exposure control, iterative reconstruction, and/or weight based adjustment of the mA/kV was utilized to reduce the radiation dose to as low as reasonably achievable. COMPARISON: None available. CLINICAL HISTORY: Neck pain, acute, known malignancy. FINDINGS: CERVICAL SPINE: BONES AND ALIGNMENT: Straightening of the normal cervical lordosis. No acute fracture or traumatic malalignment. DEGENERATIVE CHANGES: Disc osteophyte complex at C3-C4 resulting in at least mild osseous spinal canal stenosis. Facet arthrosis and uncovertebral hypertrophy at multiple levels. SOFT TISSUES: No prevertebral soft tissue swelling. IMPRESSION: 1. No acute abnormality of the cervical spine. 2. Disc osteophyte complex at C3-4 resulting in at least mild osseous spinal canal stenosis. Electronically signed by: Donnice Mania MD 12/06/2023 02:56 PM EST RP Workstation: [id number]   CT Head Wo Contrast Result Date: 12/06/2023 EXAM: CT HEAD WITHOUT CONTRAST 12/06/2023 01:49:00 PM TECHNIQUE: CT of the head was performed without the administration of intravenous contrast. Automated exposure control, iterative reconstruction, and/or weight based adjustment of the mA/kV was utilized to reduce the radiation dose to as low as reasonably achievable. COMPARISON: 06/01/2023 CLINICAL HISTORY: Headache, new onset (Age >= 51y) FINDINGS: BRAIN AND VENTRICLES: No acute hemorrhage. No evidence of acute infarct. Periventricular and deep Bergdoll matter hypodensity typical of chronic small  vessel ischemia, unchanged. Remote right parietal infarct again noted. Remote lacunar infarcts in the bilateral thalami. Remote infarct in the central pons. No hydrocephalus. No extra-axial collection. No mass effect or midline shift. ORBITS: No acute abnormality. SINUSES: No acute abnormality. SOFT TISSUES AND SKULL: No acute soft tissue abnormality. No skull fracture. Atherosclerosis of skullbase vasculature without hyperdense vessel or abnormal calcification. IMPRESSION: 1. No acute intracranial abnormality. 2. Similar chronic microvascular ischemic changes and remote infarcts. Electronically signed by: Donnice Mania MD 12/06/2023 02:24 PM EST RP Workstation: [id number]     Procedures   Medications Ordered in the ED - No data to display                                  Medical Decision Making Benjermin Vertis Scheib is a 63 y.o. male here presenting with neck pain and headache and numbness.  Patient has nonfocal neuroexam currently.  Consider electrolyte abnormality versus head bleed.  Low suspicion for stroke.  Plan to get CBC and BMP and CT head and CT cervical spine  4:11 PM Labs unremarkable.  CT head and cervical spine unremarkable.  UA showed some bacteria but patient has a urostomy and this appears to be a chronic finding and he denies any urinary symptoms.  This time patient is stable for discharge.  Likely migraines causing his symptoms    Problems Addressed: Nonintractable headache, unspecified chronicity pattern, unspecified headache type: acute illness or injury  Amount and/or Complexity of Data Reviewed Labs: ordered. Decision-making details documented in ED Course.     Final diagnoses:  None    ED Discharge Orders     None          Patt Alm Macho, MD 12/06/23 [phone]

## 2023-12-13 ENCOUNTER — Ambulatory Visit (INDEPENDENT_AMBULATORY_CARE_PROVIDER_SITE_OTHER): Admitting: Gastroenterology

## 2023-12-13 ENCOUNTER — Other Ambulatory Visit (INDEPENDENT_AMBULATORY_CARE_PROVIDER_SITE_OTHER)

## 2023-12-13 ENCOUNTER — Encounter: Payer: Self-pay | Admitting: Gastroenterology

## 2023-12-13 VITALS — BP 132/78 | HR 81 | Ht >= 80 in | Wt 310.0 lb

## 2023-12-13 DIAGNOSIS — K2990 Gastroduodenitis, unspecified, without bleeding: Secondary | ICD-10-CM

## 2023-12-13 DIAGNOSIS — K2970 Gastritis, unspecified, without bleeding: Secondary | ICD-10-CM

## 2023-12-13 DIAGNOSIS — D509 Iron deficiency anemia, unspecified: Secondary | ICD-10-CM

## 2023-12-13 DIAGNOSIS — C679 Malignant neoplasm of bladder, unspecified: Secondary | ICD-10-CM | POA: Diagnosis not present

## 2023-12-13 LAB — IBC + FERRITIN
Ferritin: 13.9 ng/mL — ABNORMAL LOW (ref 22.0–322.0)
Iron: 38 ug/dL — ABNORMAL LOW (ref 42–165)
Saturation Ratios: 12.3 % — ABNORMAL LOW (ref 20.0–50.0)
TIBC: 309.4 ug/dL (ref 250.0–450.0)
Transferrin: 221 mg/dL (ref 212.0–360.0)

## 2023-12-13 NOTE — Patient Instructions (Signed)
 Please go to the lab in the basement of our building to have lab work done as you leave today. Hit B for basement when you get on the elevator.  When the doors open the lab is on your left.  We will call you with the results. Thank you.  Please follow up as needed.  Thank you for entrusting me with your care and for choosing [facility], Dr. Sandor Flatter   _______________________________________________________  If your blood pressure at your visit was 140/90 or greater, please contact your primary care physician to follow up on this.  _______________________________________________________  If you are age 61 or older, your body mass index should be between 23-30. Your Body mass index is 33.22 kg/m. If this is out of the aforementioned range listed, please consider follow up with your Primary Care Provider.  If you are age 89 or younger, your body mass index should be between 19-25. Your Body mass index is 33.22 kg/m. If this is out of the aformentioned range listed, please consider follow up with your Primary Care Provider.   ________________________________________________________  The [facility] providers would like to encourage you to use MYCHART to communicate with providers for non-urgent requests or questions.  Due to long hold times on the telephone, sending your provider a message by [facility] may be a faster and more efficient way to get a response.  Please allow 48 business hours for a response.  Please remember that this is for non-urgent requests.  _______________________________________________________  [facility] is using a team-based approach to care.  Your team is made up of your doctor and two to three APPS. Our APPS (Nurse Practitioners and Physician Assistants) work with your physician to ensure care continuity for you. They are fully qualified to address your health concerns and develop a treatment plan. They communicate directly with your  gastroenterologist to care for you. Seeing the Advanced Practice Practitioners on your physician's team can help you by facilitating care more promptly, often allowing for earlier appointments, access to diagnostic testing, procedures, and other specialty referrals.

## 2023-12-13 NOTE — Progress Notes (Signed)
 Chief Complaint:    Iron deficiency anemia  GI History: 63  year old A.A. male patient with past medical history of DM type 2, A-fib (on Eliquis ), NSTEMI, hypertension, Intracranial hemorrhage caused by malignant hypertension, bladder/protate CA, cystoprostatectomy, ileal conduit, cholecystectomy.  Referred to the GI clinic in 07/2023 for evaluation of IDA and CRC screening.  No prior EGD or colonoscopy.  No overt bleeding.    Endoscopic History: - 08/2023: EGD: Moderate gastritis with shallow ulceration, mild inflammation in the duodenal bulb and first portion of the duodenum (path benign).  Treated with Protonix  40 mg twice daily x 6 weeks then reduced to 40 mg daily - 08/2023: Colonoscopy: Normal colon, small internal hemorrhoids, normal TI.  Repeat 10 years  HPI:     Patient is a 63 y.o. male presenting to the Gastroenterology Clinic for follow-up after recent EGD/colonoscopy as above.  He was treated with Protonix  40 mg twice daily x 6 weeks for moderate gastritis.  Completed 6 weeks of high-dose PPI and has since discontinued completely.  Otherwise no reflux symptoms or need for long-term PPI.  He states he otherwise feels well today.  No active issues or complaints today.  Had follow-up with his Oncologist on 11/21/2023.  Did have issues tolerating oral iron previously; no longer taking.   Reviewed labs from 12/06/2023: - H/H 11.6/36 with MCV/RDW 89/14 - Normal CMP  Reviewed labs from 10/2023: - Ferritin 46, normal B12 - H/H 11.1/34.7 with MCV/RDW 89/13.5  Reviewed CT A/P from 11/17/2023: No acute intra-abdominal pathology    Review of systems:     No chest pain, no SOB, no fevers, no urinary sx   Past Medical History:  Diagnosis Date   Abnormal ultrasound of carotid artery 04/2014   no significant obstruction   Ataxia 04/2014   Atrial fibrillation (HCC)    Cancer (HCC)    Diabetes (HCC)    EKG abnormalities 04/2014   poor R wave progression, NSR   Former smoker     25 years x 1.5 ppd, stopped 04/2014   Gait disturbance 04/2014   s/p stroke   H/O echocardiogram 04/2014   moderate LVH, 60-65% EF, no valve disease   History of heart attack    Hypertension    Hypertensive heart disease 04/2014   Intraparenchymal hemorrhage of brain [facility]) 05/05/2014   hospitalization [facility]   Myocardial infarction [facility])    Obesity    Prediabetes    Short-term memory loss 04/2014   Sleep apnea    Stroke (HCC) 04/2014    Patient's surgical history, family medical history, social history, medications and allergies were all reviewed in Epic    Current Outpatient Medications  Medication Sig Dispense Refill   acetaminophen  (TYLENOL ) 500 MG tablet Take 500 mg by mouth every 6 (six) hours as needed for moderate pain (pain score 4-6).     amLODipine  (NORVASC ) 10 MG tablet Take 1 tablet (10 mg total) by mouth daily. 90 tablet 3   amoxicillin (AMOXIL) 500 MG capsule Take 500 mg by mouth 3 (three) times daily.     apixaban  (ELIQUIS ) 5 MG TABS tablet TAKE 1 TABLET(5 MG) BY MOUTH TWICE DAILY 90 tablet 3   blood glucose meter kit and supplies KIT Dispense based on patient and insurance preference. Check fasting blood sugar once daily ICD10 R73.03 1 each 0   Cholecalciferol (VITAMIN D3 MAXIMUM STRENGTH) 125 MCG (5000 UT) capsule Take 5,000 Units by mouth daily.     diclofenac  Sodium (VOLTAREN )  1 % GEL Apply 4 g topically 4 (four) times daily as needed (pain).     gabapentin  (NEURONTIN ) 100 MG capsule Take 2 tablets at bedtime (200 mg) 60 capsule 3   glucose blood test strip Use as instructed to monitor FSBS 1x daily. Dx: R73.09 100 each 12   ibuprofen  (ADVIL ) 800 MG tablet Take 1 tablet (800 mg total) by mouth every 8 (eight) hours as needed. 30 tablet 3   labetalol  (NORMODYNE ) 200 MG tablet TAKE 1 TABLET(200 MG) BY MOUTH TWICE DAILY 180 tablet 3   Lancets (ONETOUCH ULTRASOFT) lancets Use as instructed 100 each 12   methocarbamol  (ROBAXIN ) 500 MG tablet Take 500  mg by mouth as needed.     mirabegron  ER (MYRBETRIQ ) 25 MG TB24 tablet Take 1 tablet (25 mg total) by mouth daily. 30 tablet 0   Multiple Vitamins-Minerals (ONE-A-DAY MENS 50+ ADVANTAGE) TABS Take 1 tablet by mouth daily.     pantoprazole  (PROTONIX ) 40 MG tablet Take 1 tablet (40 mg total) by mouth 2 (two) times daily. Protonix  40 mg twice daily for 6 weeks, then reduce to 40 mg daily. 90 tablet 3   prochlorperazine  (COMPAZINE ) 10 MG tablet Take 1 tablet (10 mg total) by mouth every 8 (eight) hours as needed for nausea or vomiting (headache). 10 tablet 0   solifenacin  (VESICARE ) 5 MG tablet Take 1 tablet (5 mg total) by mouth daily. 30 tablet 0   tirzepatide  (MOUNJARO ) 12.5 MG/0.5ML Pen ADMINISTER 12.5 MG UNDER THE SKIN 1 TIME WEEKLY 6 mL 3   No current facility-administered medications for this visit.    Physical Exam:     BP 132/78 (BP Location: Right Arm, Patient Position: Sitting, Cuff Size: Large)   Pulse 81   Ht 6' 9 (2.057 m)   Wt (!) 310 lb (140.6 kg)   BMI 33.22 kg/m   GENERAL:  Pleasant male in NAD PSYCH: : Cooperative, normal affect Musculoskeletal:  Normal muscle tone, normal strength NEURO: Alert and oriented x 3, no focal neurologic deficits   IMPRESSION and PLAN:    1) Iron deficiency anemia Suspect multifactorial related to surgical history and possibly component of gastritis, particularly with anticoagulation.  Otherwise no overt bleeding and H/H with excellent response and now stable in the mid 11's.  Suspect this will be his baseline. - Check iron panel today.  If normalized, can continue checking periodically through the Hematology clinic - Did not tolerate oral iron very well previously.  If needing supplemental iron, recommend alternate formulation, every other day dosing, or moved to IV iron  2) Gastritis Completed high-dose PPI therapy.  Biopsies otherwise negative for H. pylori, metaplasia, dysplasia.  Follow clinically.  3) History of bladder cancer -  Continue follow-up in the Oncology clinic as scheduled  RTC as needed     I spent 30 minutes of time, including in depth chart review, independent review of results as outlined above, communicating results with the patient directly, face-to-face time with the patient, coordinating care, and ordering studies and medications as appropriate, and documentation.       Sandor GAILS Maidie Streight ,DO, FACG 12/13/2023, 9:22 AM

## 2023-12-16 ENCOUNTER — Ambulatory Visit: Payer: Self-pay | Admitting: Gastroenterology

## 2023-12-18 NOTE — Progress Notes (Unsigned)
 Brad Brad [name] OFFICE PROGRESS NOTE  Patient Care Team: Brad Ferrier, DO as PCP - General (Family Medicine) Brad Oneil BROCKS, MD as PCP - Cardiology (Cardiology) Brad [name], [facility] Brad Brad BROCKS, [name]  Brad Brad [name] is a 63 y.o.male with history of hypertension, CVA, type 2 diabetes, OSA being follow up at Medical Oncology Clinic for muscle invasive bladder cancer.   IDA. Assessment & Plan   No orders of the defined types were placed in this encounter.    Brad Brad [name] [name], MD  INTERVAL HISTORY: Patient returns for follow-up.  Oncology History  Bladder cancer (HCC)  08/28/2022 Imaging   1. Contrast enhancing, lobulated endoluminal mass of the anterior bladder dome, measuring 2.6 x 2.0 x 1.2 cm, consistent with primary bladder malignancy. 2. Prominent varices about the bladder dome and anterior bladder, without overt evidence of extramural tumor extent. 3. No evidence of lymphadenopathy or metastatic disease in the abdomen or pelvis. 4. Prostatomegaly.   09/11/2022 Initial Diagnosis   Bladder cancer [facility]) At age 74 year old found to have a solid enhancing 2.5 cm anterior bladder dome mass on CT hematuria protocol on 08/28/2022 during evaluation for gross hematuria.    09/26/2022 Pathology Results   A. BLADDER TUMOR, SUPERFICIAL MARGIN DOME, TURBT:  High grade papillary urothelial carcinoma with inverted growth pattern  The carcinoma invades muscularis propria   B. BLADDER TUMOR, DEEP MARGIN DOME, TURBT:  Invasive high grade papillary urothelial carcinoma  Muscularis propria (detrusor muscle) is present and not involved by  carcinoma   C. BLADDER TUMOR, INFERIOR MARGIN, BIOPSY:  Submucosa and muscularis propria is present and not involved by  carcinoma    09/26/2022 Procedure   1.  Cystoscopy with TURBT (medium) 2.  Intravesical instillation of gemcitabine  3.  Cystogram with intraoperative interpretation of  fluoroscopic imaging   11/22/2022 - 01/07/2023 Chemotherapy   Patient is on Treatment Plan : BLADDER DOSE DENSE MVAC q14d     01/29/2023 Imaging   CT CAP: IMPRESSION: 1. Interval resection of the previously visualized bladder mass without evidence of local recurrence given limited distension. 2. No evidence of metastatic disease within the chest, abdomen or pelvis. 3.  Aortic Atherosclerosis (ICD10-I70.0).   03/07/2023 Cancer Staging   Staging form: Urinary Bladder, AJCC 8th Edition - Clinical: Stage II (cT2, cN0, cM0) - Signed by Brad Brad BROCKS, MD on 03/07/2023 Stage prefix: Initial diagnosis WHO/ISUP grade (low/high): High Grade Histologic grading system: 2 grade system   03/29/2023 Pathology Results   CYSTOPROSTATECTOMY and bilateral pelvic lymph node dissection, ileal conduit urinary diversion   No residual carcinoma identified (ypT0)  All margins of resection are negative for carcinoma   Small focus of prostatic adenocarcinoma, Gleason score 3+3=6 (grade group 1)  Tumor is organ confined (ypT2 ypN0)  Seminal vesicles, vasa deferentia and all margins of resection are  negative for tumor         PHYSICAL EXAMINATION: ECOG PERFORMANCE STATUS: {CHL ONC ECOG PS:[id number]}  There were no vitals filed for this visit. There were no vitals filed for this visit.  Relevant data reviewed during this visit included ***

## 2023-12-19 ENCOUNTER — Inpatient Hospital Stay

## 2023-12-19 ENCOUNTER — Encounter: Payer: Self-pay | Admitting: Rehabilitative and Restorative Service Providers"

## 2023-12-19 ENCOUNTER — Ambulatory Visit (INDEPENDENT_AMBULATORY_CARE_PROVIDER_SITE_OTHER): Admitting: Rehabilitative and Restorative Service Providers"

## 2023-12-19 VITALS — BP 152/94 | HR 85 | Temp 97.9°F | Resp 20 | Wt 303.6 lb

## 2023-12-19 DIAGNOSIS — R310 Gross hematuria: Secondary | ICD-10-CM | POA: Diagnosis not present

## 2023-12-19 DIAGNOSIS — D508 Other iron deficiency anemias: Secondary | ICD-10-CM | POA: Diagnosis not present

## 2023-12-19 DIAGNOSIS — Z79899 Other long term (current) drug therapy: Secondary | ICD-10-CM | POA: Insufficient documentation

## 2023-12-19 DIAGNOSIS — R262 Difficulty in walking, not elsewhere classified: Secondary | ICD-10-CM | POA: Diagnosis not present

## 2023-12-19 DIAGNOSIS — Z7901 Long term (current) use of anticoagulants: Secondary | ICD-10-CM | POA: Insufficient documentation

## 2023-12-19 DIAGNOSIS — C678 Malignant neoplasm of overlapping sites of bladder: Secondary | ICD-10-CM | POA: Insufficient documentation

## 2023-12-19 DIAGNOSIS — M5459 Other low back pain: Secondary | ICD-10-CM

## 2023-12-19 DIAGNOSIS — N400 Enlarged prostate without lower urinary tract symptoms: Secondary | ICD-10-CM | POA: Diagnosis not present

## 2023-12-19 MED ORDER — FERROUS SULFATE 325 (65 FE) MG PO TBEC: 325.0000 mg | DELAYED_RELEASE_TABLET | Freq: Every day | ORAL | 3 refills | Status: AC

## 2023-12-19 NOTE — Assessment & Plan Note (Addendum)
 Will resume Iron daily Lab 2/16 and see me in March as scheduled Stop AC if any signs of active bleeding.

## 2024-01-02 ENCOUNTER — Encounter: Admitting: Rehabilitative and Restorative Service Providers"

## 2024-01-09 ENCOUNTER — Telehealth: Payer: Self-pay

## 2024-01-09 NOTE — Telephone Encounter (Signed)
 Received call from [place] at [facility] regarding patient.   She reports that records show that patient has Type II diabetes dx and that patient is not being prescribed a statin.   Clinical guidelines recommend statin therapy.   Advised that I would forward to PCP.    If appropriate, they are requesting that prescription be sent to patient's preferred pharmacy.   Chiquita JAYSON English, RN

## 2024-01-14 ENCOUNTER — Ambulatory Visit: Admitting: Neurology

## 2024-01-14 DIAGNOSIS — R9431 Abnormal electrocardiogram [ECG] [EKG]: Secondary | ICD-10-CM

## 2024-01-14 DIAGNOSIS — G4720 Circadian rhythm sleep disorder, unspecified type: Secondary | ICD-10-CM

## 2024-01-14 DIAGNOSIS — G4733 Obstructive sleep apnea (adult) (pediatric): Secondary | ICD-10-CM

## 2024-01-29 NOTE — Procedures (Signed)
 Physician Interpretation: Please see link under Procedure Tab or under Encounters tab for physician report, technical report, as well as O2 titration and/or PAP titration tables (if applicable).

## 2024-02-04 ENCOUNTER — Ambulatory Visit: Payer: Self-pay | Admitting: Family Medicine

## 2024-02-04 DIAGNOSIS — G4733 Obstructive sleep apnea (adult) (pediatric): Secondary | ICD-10-CM

## 2024-02-13 ENCOUNTER — Other Ambulatory Visit (HOSPITAL_COMMUNITY): Payer: Self-pay

## 2024-02-13 ENCOUNTER — Telehealth: Payer: Self-pay

## 2024-02-13 NOTE — Telephone Encounter (Signed)
 Pharmacy Patient Advocate Encounter   Received notification from [facility] that prior authorization for MOUNJARO  12.5MG  is required/requested.   Insurance verification completed.   The patient is insured through [facility] MEDICARE.   Per test claim: PA required; PA started via CoverMyMeds. KEY [id number]. . Waiting for clinical questions to populate.

## 2024-02-14 NOTE — Telephone Encounter (Signed)
 Pharmacy Patient Advocate Encounter  PA required; PA submitted to above mentioned insurance via Latent Key/confirmation #/[id number]. Status is pending

## 2024-02-17 NOTE — Telephone Encounter (Signed)
 Pharmacy Patient Advocate Encounter  Received notification from [employer] that Prior Authorization for MOUNJARO  has been APPROVED from 01/30/24 to 02/13/25   PA #/Case ID/Reference #: [id number]

## 2024-02-19 LAB — OPHTHALMOLOGY REPORT-SCANNED

## 2024-03-16 ENCOUNTER — Inpatient Hospital Stay

## 2024-03-30 ENCOUNTER — Inpatient Hospital Stay

## 2024-08-06 ENCOUNTER — Encounter
